# Patient Record
Sex: Female | Born: 1945 | Race: White | Hispanic: No | Marital: Married | Smoking: Never smoker
Health system: Southern US, Community
[De-identification: ages and names within clinical notes are randomized; demographics above are authoritative.]

## PROBLEM LIST (undated history)

## (undated) DIAGNOSIS — M719 Bursopathy, unspecified: Secondary | ICD-10-CM

## (undated) DIAGNOSIS — J329 Chronic sinusitis, unspecified: Secondary | ICD-10-CM

## (undated) DIAGNOSIS — I10 Essential (primary) hypertension: Secondary | ICD-10-CM

## (undated) DIAGNOSIS — R0600 Dyspnea, unspecified: Secondary | ICD-10-CM

## (undated) DIAGNOSIS — G4730 Sleep apnea, unspecified: Secondary | ICD-10-CM

## (undated) DIAGNOSIS — Z973 Presence of spectacles and contact lenses: Secondary | ICD-10-CM

## (undated) DIAGNOSIS — K219 Gastro-esophageal reflux disease without esophagitis: Secondary | ICD-10-CM

## (undated) DIAGNOSIS — M1990 Unspecified osteoarthritis, unspecified site: Secondary | ICD-10-CM

## (undated) DIAGNOSIS — E785 Hyperlipidemia, unspecified: Secondary | ICD-10-CM

## (undated) DIAGNOSIS — G7000 Myasthenia gravis without (acute) exacerbation: Secondary | ICD-10-CM

## (undated) DIAGNOSIS — M797 Fibromyalgia: Secondary | ICD-10-CM

## (undated) DIAGNOSIS — E119 Type 2 diabetes mellitus without complications: Secondary | ICD-10-CM

## (undated) HISTORY — PX: FINGER GANGLION CYST EXCISION: SHX1636

## (undated) HISTORY — DX: Fibromyalgia: M79.7

## (undated) HISTORY — PX: COLONOSCOPY: SHX174

## (undated) HISTORY — DX: Chronic sinusitis, unspecified: J32.9

## (undated) HISTORY — DX: Type 2 diabetes mellitus without complications: E11.9

## (undated) HISTORY — DX: Bursopathy, unspecified: M71.9

## (undated) HISTORY — PX: TONSILLECTOMY: SUR1361

## (undated) HISTORY — PX: INCISION AND DRAINAGE ABSCESS: SHX5864

---

## 1997-04-29 HISTORY — PX: ABDOMINAL HYSTERECTOMY: SHX81

## 2000-04-29 HISTORY — PX: BREAST SURGERY: SHX581

## 2001-10-05 ENCOUNTER — Ambulatory Visit (HOSPITAL_COMMUNITY): Admission: RE | Admit: 2001-10-05 | Discharge: 2001-10-05 | Payer: Self-pay | Admitting: General Surgery

## 2003-04-30 HISTORY — PX: CHOLECYSTECTOMY: SHX55

## 2003-08-04 ENCOUNTER — Ambulatory Visit (HOSPITAL_COMMUNITY): Admission: RE | Admit: 2003-08-04 | Discharge: 2003-08-04 | Payer: Self-pay | Admitting: Unknown Physician Specialty

## 2003-08-22 ENCOUNTER — Ambulatory Visit (HOSPITAL_COMMUNITY): Admission: RE | Admit: 2003-08-22 | Discharge: 2003-08-22 | Payer: Self-pay | Admitting: General Surgery

## 2004-03-09 ENCOUNTER — Ambulatory Visit: Payer: Self-pay | Admitting: Family Medicine

## 2004-08-03 ENCOUNTER — Ambulatory Visit: Payer: Self-pay | Admitting: Family Medicine

## 2004-08-14 ENCOUNTER — Ambulatory Visit: Payer: Self-pay | Admitting: Cardiology

## 2004-08-14 ENCOUNTER — Observation Stay (HOSPITAL_COMMUNITY): Admission: EM | Admit: 2004-08-14 | Discharge: 2004-08-15 | Payer: Self-pay | Admitting: Emergency Medicine

## 2004-08-16 ENCOUNTER — Ambulatory Visit: Payer: Self-pay

## 2004-08-31 ENCOUNTER — Ambulatory Visit: Payer: Self-pay | Admitting: Cardiovascular Disease

## 2004-11-12 ENCOUNTER — Ambulatory Visit: Payer: Self-pay | Admitting: Family Medicine

## 2004-11-12 ENCOUNTER — Ambulatory Visit: Payer: Self-pay | Admitting: Cardiology

## 2005-02-06 ENCOUNTER — Ambulatory Visit: Payer: Self-pay | Admitting: Family Medicine

## 2005-03-28 ENCOUNTER — Ambulatory Visit: Payer: Self-pay | Admitting: Family Medicine

## 2005-04-29 HISTORY — PX: CERVICAL FUSION: SHX112

## 2005-05-15 ENCOUNTER — Ambulatory Visit: Payer: Self-pay | Admitting: Family Medicine

## 2005-08-21 ENCOUNTER — Emergency Department (HOSPITAL_COMMUNITY): Admission: EM | Admit: 2005-08-21 | Discharge: 2005-08-21 | Payer: Self-pay | Admitting: Emergency Medicine

## 2005-08-29 ENCOUNTER — Encounter: Admission: RE | Admit: 2005-08-29 | Discharge: 2005-09-12 | Payer: Self-pay | Admitting: Sports Medicine

## 2005-09-19 ENCOUNTER — Inpatient Hospital Stay (HOSPITAL_COMMUNITY): Admission: RE | Admit: 2005-09-19 | Discharge: 2005-09-20 | Payer: Self-pay | Admitting: Neurosurgery

## 2005-12-05 ENCOUNTER — Ambulatory Visit: Payer: Self-pay | Admitting: Family Medicine

## 2006-01-21 ENCOUNTER — Ambulatory Visit: Payer: Self-pay | Admitting: Family Medicine

## 2006-04-09 ENCOUNTER — Ambulatory Visit: Payer: Self-pay | Admitting: Family Medicine

## 2006-06-03 ENCOUNTER — Ambulatory Visit: Payer: Self-pay | Admitting: Cardiology

## 2006-07-22 ENCOUNTER — Encounter: Admission: RE | Admit: 2006-07-22 | Discharge: 2006-07-22 | Payer: Self-pay | Admitting: Family Medicine

## 2006-10-07 ENCOUNTER — Ambulatory Visit: Payer: Self-pay | Admitting: Family Medicine

## 2007-01-26 ENCOUNTER — Encounter: Admission: RE | Admit: 2007-01-26 | Discharge: 2007-04-26 | Payer: Self-pay | Admitting: Family Medicine

## 2007-06-18 ENCOUNTER — Ambulatory Visit: Payer: Self-pay | Admitting: Cardiology

## 2007-08-27 ENCOUNTER — Encounter: Admission: RE | Admit: 2007-08-27 | Discharge: 2007-08-27 | Payer: Self-pay | Admitting: Family Medicine

## 2008-03-17 ENCOUNTER — Encounter (INDEPENDENT_AMBULATORY_CARE_PROVIDER_SITE_OTHER): Payer: Self-pay | Admitting: Interventional Radiology

## 2008-03-17 ENCOUNTER — Other Ambulatory Visit: Admission: RE | Admit: 2008-03-17 | Discharge: 2008-03-17 | Payer: Self-pay | Admitting: Interventional Radiology

## 2008-03-17 ENCOUNTER — Encounter: Admission: RE | Admit: 2008-03-17 | Discharge: 2008-03-17 | Payer: Self-pay | Admitting: Endocrinology

## 2008-09-12 ENCOUNTER — Encounter: Admission: RE | Admit: 2008-09-12 | Discharge: 2008-09-12 | Payer: Self-pay | Admitting: Family Medicine

## 2009-11-06 ENCOUNTER — Encounter: Admission: RE | Admit: 2009-11-06 | Discharge: 2009-11-06 | Payer: Self-pay | Admitting: Obstetrics and Gynecology

## 2009-11-14 ENCOUNTER — Encounter: Admission: RE | Admit: 2009-11-14 | Discharge: 2009-11-14 | Payer: Self-pay | Admitting: Obstetrics and Gynecology

## 2010-05-20 ENCOUNTER — Encounter (HOSPITAL_COMMUNITY): Payer: Self-pay | Admitting: Obstetrics and Gynecology

## 2010-09-11 NOTE — Assessment & Plan Note (Signed)
[facility]                          [facility] NOTE   NAME:[name], [name]                    MRN:          [id number]  DATE:06/18/2007                            DOB:          [birth year]    PRIMARY CARE PHYSICIAN:  Dr. Joette Catching.   REASON FOR VISIT:  Cardiology follow-up.   HISTORY OF PRESENT ILLNESS:  [name] comes in for annual follow-up  visit.  She is not reporting any significant chest pain and has stable  NYHA class II dyspnea on exertion.  She states that she is limited by  bursitis affecting her left hip and is undergoing some therapy to  address this.  She also states that she has been diagnosed with sleep  apnea and is being considered for CPAP therapy.  We talked about diet  and weight loss.  She is actually heavier than she was last time she was  seen.  She reports her lipids are being followed Dr. Lysbeth Galas and that  these are in good order.  I reviewed her medications.   FOLLOWUP:  Electrocardiography shows sinus arrhythmia with prolonged PR  interval of 222 ms, otherwise no significant changes.   ALLERGIES:  CELEBREX, VIOXX and BEXTRA.   MEDICATIONS:  Avalide 150/12.5 mg p.o. daily, aspirin 81 mg p.o. daily.  Glucosamine as directed.  Omega 3 supplements 1000 mg p.o. b.i.d.  Prilosec 20 mg p.o. daily.  Simvastatin 40 mg p.o. daily.  Estradiol  0.025 mg daily.  Arthrotec.  Metformin 500 mg p.o. daily.   REVIEW OF SYSTEMS:  Status present illness.  Otherwise negative.   EXAMINATION:  The patient blood pressure 136/83 rate is 67.  Weight is  249 pounds.  Is an obese woman in no acute distress.  Examination neck reveals no elevated is pressure no loud bruits.  No  thyromegaly is noted.  Lungs are clear on breathing.  Cardiac exam reveals a regular rate and rhythm.  No loud murmur, S3  gallop.  EXTREMITIES:  Exhibit trace edema.   IMPRESSION AND RECOMMENDATIONS:  History of equivocal myocardial  perfusion  study in the past with no active angina and stable dyspnea on  exertion.  Her plan has been risk factor modification and observation  for development of any new symptoms.  The patient's electrocardiogram  was overall stable.  I have recommended continued efforts at diet,  exercise and weight loss.  Her medical regimen is reasonable includes  statin therapy.  Her goal LDL  should be around 70.  We will plan to continue a yearly follow-up  assuming she develop any new symptoms in the interim.     Jonelle Sidle, MD  Electronically Signed    SGM/MedQ  DD: 06/18/2007  DT: 06/18/2007  Job #: [id number]   cc:   Delaney Meigs, M.D.

## 2010-09-14 NOTE — H&P (Signed)
NAMENJERI, VICENTE NO.:  [id number]   MEDICAL RECORD NO.:  [id number]          PATIENT TYPE:  INP   LOCATION:  1825                         FACILITY:  [facility]   PHYSICIAN:  Jonelle Sidle, M.D. LHCDATE OF BIRTH:  09-03-1945   DATE OF ADMISSION:  08/14/2004  DATE OF DISCHARGE:                                HISTORY & PHYSICAL   PRIMARY CARE PHYSICIAN:  Primary care was previously by Dr. Colon Flattery in  [place].  She still sees physicians in the primary care office in [place].   CHIEF COMPLAINT:  Left arm and upper back discomfort.   HISTORY OF THE PRESENT ILLNESS:  Ms. Stjulien is a 65 year old woman with a  history of obesity, hypertension, dyslipidemia and reported family history  of cardiovascular disease.  She reports an approximate one-week long history  of intermittent left arm discomfort with radiation to the back on the left  side as well as the midscapular area.  She describes this as a dull ache,  and states that she has had recurrent intermittent episodes lasting anywhere  from a few minutes to several hours without clear precipitant, although she  thinks things may be worse when she moves her left arm.  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  She thought that this was  potentially bursitis and saw Dr. Brynda Greathouse here in [place] today who  apparently did some plane films  of the area and by patient report was noted  to have some degree of cervical disk disease, although this was not felt to  be clearly causing symptoms.  It was suggested that she have an  electrocardiogram and she went to [facility] where her electrocardiogram  showed normal sinus rhythm with nonspecific T wave changes.  There was a  history obtained at that time of potential chest pain and diaphoresis, and  she was subsequently sent to the emergency department.   On interview now the  patient complains only of her left arm and back  discomfort, denying any recent chest pain, shortness of breath, palpitations  or diaphoresis.  She states that she has been taking Tylenol and Advil at  home and did receive an aspirin at [facility].  A repeat electrocardiogram  in the emergency department shows borderline prolonged PR interval with no  clear ischemic ST changes and her initial point of care troponin I level is  normal.  We were asked to evaluate her further.  She does states that she  has had no previous cardiac testing.   ALLERGIES:  CELEBREX, VIOXX and BEXTRA ALL OF WHICH CAUSE RASH AND SWELLING.   MEDICATIONS:  Medications at home include:  1.  Avalide 150/12.5 mg 1 p.o. daily.  2.  Aspirin 81 mg p.o. daily.  3.  Glucosamine chondroitin as directed.  4.  Fish oil supplements 1,000 mg 2 tablets p.o. b.i.d.  5.  _____________ extract.   PAST MEDICAL HISTORY:  1.  Hypertension.  2.  Reported dyslipidemia, managed with ___________ extract and  fish oil      supplements.  3.  History of chronic right ankle discomfort.  The patient wears a brace      and uses Relafen p.r.n.  4.  Previous history of laparoscopic cholecystectomy due to biliary colic by      Dr. Lovell Sheehan back in April 2005.   FAMILY HISTORY:  The family history is reportedly significant for  cardiovascular disease.  The patient states that her mother died in her mid  68s with congestive heart failure and that her father had no significant  heart problems.  She does report some heart problems in her siblings  including the death of her sister at age 12 with what sounds to be a  hypertensive emergency and subsequent hemorrhagic event.   SOCIAL HISTORY:  The patient is married and has two daughters.  She has  worked as a Haematologist for 30 years, currently at [employer] in [place].  She resides in [place].  She denies any significant tobacco or alcohol use.   REVIEW OF SYSTEMS:  The review of systems is as  per the history of present  illness.  She has had no fevers, chills, hemoptysis, shortness of breath  with activity, lower extremity, palpitations, or syncope.  Review of systems  is otherwise negative.   PHYSICAL EXAMINATION:  VITAL SIGNS:  Blood pressure is 135/63.  Heart rate  is in the 80s and sinus rhythm.  Oxygen saturation is in the upper 90s on 2  liters nasal cannula.  GENERAL APPEARANCE:  In general this is an obese woman seated in bed in no  acute distress.  HEENT:  Conjunctivae and lids normal.  Oropharynx is clear.  NECK:  The neck is supple without elevated jugular venous pressure or  carotid bruits.  No thyromegaly is noted.  LUNGS:  The lungs exhibit normal breath sounds with normal breathing at  rest, and no wheezing.  HEART:  Cardiac exam reveals a regular rate and rhythm with no loud murmur  or S3 gallop.  There is no obvious pericardial rub noted.  ABDOMEN:  The abdomen is obese and nontender to palpation with normoactive  bowel sounds and no obvious organomegaly.  EXTREMITIES:  The extremities reveal no pitting edema.  Peripheral pulses  are 2+.  SKIN:  No ulcerative changes are noted.  MUSCULOSKELETAL:  No kyphosis is noted.  NEUROPSYCHIATRIC:  The patient is alert and oriented times three.  Affect is  normal.   LABORATORY DATA:  Laboratory data includes hemoglobin 13.6, hematocrit 40.0,  WBC 7.3 and platelets 327,000.  D-dimer less than 0.22.  ABGs; pH 7.36, pCO2  47, bicarb 26.  Sodium 140, potassium 3.8, chloride 107, glucose 101, BUN  14, and creatinine is pending.  Initial point of care troponin I levels are  less than 0.05 times two with normal CK/MB levels of 1.6 and 2.1, and peak  myoglobin of 178.   Chest x-ray is reported as showing no acute cardiopulmonary disease.   IMPRESSION:  1.  Recent intermittent left arm and upper back discomfort as described in      an obese 65 year old woman with hypertension, reported dyslipidemia and     potential  family history of cardiovascular disease.  She has had no      previous screening for cardiac disease.  Her electrocardiogram shows      nonspecific changes and her initial point of care cardiac markers are      normal.  Symptoms do seem musculoskeletal in the  description although      apparently she was seen by an orthopedist today who did not describe any      major symptom-inducing abnormalities so far.  2.  Hypertension on Avalide.  3.  Dyslipidemia on _________ rice and fish oil supplements.  4.  History of chronic right ankle pain.  The patient wears a brace and uses      Relafen.   PLAN:  1.  The patient will be admitted to observation.  2.  We will continue telemetry and cycle cardiac markers overnight.  3.  As soon as she remains stable and rules out for myocardial infarction we      can likely discharge for a close follow up adenosine Myoview for further      risk stratification.  Otherwise if cardiac markers are abnormal we will      need to consider invasive coronary angiography.  4.  We will also follow up fasting lipid profile.  5.  Continue Avalide.  6.  We will not use heparin or Lovenox for the time being unless cardiac      markers trend abnormally.  7.  Further plans to follow.      SGM/MEDQ  D:  08/14/2004  T:  08/15/2004  Job:  [id number]

## 2010-09-14 NOTE — Assessment & Plan Note (Signed)
[facility]                            CARDIOLOGY OFFICE NOTE   NAME:[name], [name]                    MRN:          [id number]  DATE:06/03/2006                            DOB:          [birth year]    PRIMARY CARE PHYSICIAN:  Delaney Meigs, M.D.   REASON FOR VISIT:  Routine followup.   HISTORY OF PRESENT ILLNESS:  I last saw [name] in the office back in  July of 2006 in [place]. She had a prior history of shoulder and chest  discomfort and ultimately underwent an outpatient Myoview, which was  overall low risk, but suggested the possibility of mild anterior  ischemia versus shifting breast attentuation artifact. Her symptoms  subsequently resolved with anti-inflammatory therapy and were felt to be  possibly musculoskeletal. She tells me in the interim that she was  diagnosed with cervical disc disease and actually had to undergo  surgery. These previous symptoms have resolved. She was due to see [place]  back this past summerbut cancelled her visit and comes in today for  routine follow-up. She is not reporting any progressive exertional chest  pain or dyspnea. She occasionally had a vague discomfort below her left  breast, that does not seem to have any specific precipitant and she  states that this does not bother or limit her to any signficant degree.   Her electrocardiogram today is normal showing sinus rhythm at 89 beats  per minute.   I reviewed her medications.   ALLERGIES:  1. CELEBREX.  2. VIOXX.  3. BEXTRA.   PRESENT MEDICATIONS:  1. Aspirin 81 mg p.o. daily.  2. Avalide 150/12.5 mg p.o. daily.  3. Glucosamine daily.  4. Fish oil supplements 1000 mg p.o. b.i.d.  5. Prilosec 20 mg p.o. daily.  6. Simvastatin 20 mg p.o. daily.   REVIEW OF SYSTEMS:  As per History of Present Illness.   PHYSICAL EXAMINATION:  Blood pressure is 148/84. Heart rate is 89. The  patient is comfortable and in no acute distress.  HEENT: Conjunctivae  and lids are normal. Oropharynx is clear.  NECK: Supple, without elevated jugular pressure or loud bruits.  LUNGS:  Are clear without labored breathing.  CARDIAC: Reveals a regular rate and rhythm without loud murmur or S3  gallop. No pericardial rub is noted.  ABDOMEN: Obese. No obvious bruits. No tenderness.  EXTREMITIES: Exhibit increase adipose tissue. No obvious pitting edema.   IMPRESSIONS AND RECOMMENDATIONS:  1. History of equivocal myocardial perfusion study as outlined above.      The patient's aforementioned symptoms have completely resolved and      may have been due to musculoskeletal and/or neuropathic discomfort      particularly with the interim diagnosis of cervical disc disease.      In any event, I have recommended a strategy of risk factor      modification particularly given her family history. Her medical      regimen is reasonable and I have asked her to continue to followup      with Dr. Lysbeth Galas in the meanwhile for blood pressure and lipid  management. We will otherwise schedule a yearly followup assuming      that she does not develop any progressive symptoms in the interim.  2. Further plans to follow.     Jonelle Sidle, MD  Electronically Signed    SGM/MedQ  DD: 06/03/2006  DT: 06/03/2006  Job #: [id number]   cc:   Delaney Meigs, M.D.

## 2010-09-14 NOTE — Op Note (Signed)
NAMEGAYLA, BENN NO.:  [id number]   MEDICAL RECORD NO.:  [id number]          PATIENT TYPE:  INP   LOCATION:  3003                         FACILITY:  [facility]   PHYSICIAN:  Clydene Fake, M.D.  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   OPERATION PERFORMED:  Anterior cervical diskectomy and fusion of C6-7 with  Life-Net allograft bone, Eagle anterior cervical plate.   SURGEON:  Clydene Fake, M.D.   ASSISTANT:  Payton Doughty, M.D.   ANESTHESIA:  General endotracheal.   ESTIMATED BLOOD LOSS:  Minimal.   BLOOD REPLACED:  None.   DRAINS:  None.   COMPLICATIONS:  None.   INDICATIONS FOR PROCEDURE:  The patient is a 65 year old woman who has had  severe pain in neck and left arm numbness and weakness and found to have a  large disk herniation on the left side at C6-7 causing a triceps weakness  and numbness __________  Patient brought in for decompression and fusion.   DESCRIPTION OF PROCEDURE:  The patient was brought to the operating room and  general anesthesia induced.  The patient was placed in 10 pounds halter  traction, prepped and draped in sterile fashion.  Site of incision was  injected with 19 mL of 1% lidocaine with epinephrine.  Incision was then  made from the midline to the anterior border of the sternocleidomastoid  muscle on the left side of the neck incision and taken down to the platysma  and hemostasis was obtained with Bovie cauterization.  The platysma was  incised with a Bovie and blunt dissection taken through the anterior  cervical fascia, the anterior cervical spine.  Needles placed in the  interspace, x-rays obtained showing that  the needles were too low. Needles  were repositioned and x-ray was obtained showing needles were at the C6-7  disk space.  This space was incised with a 15 blade and a partial diskectomy  as the needle was removed.  The longus colli muscles reflected laterally on  each side using Bovie and self-retaining retractors were placed.  Distraction pins placed in the C6 and 7.  Interspace distracted.  Diskectomy  was then continued using Kerrison punches to remove anterior osteophytes,  curettes and pituitaries to remove further disk.  1 or 2 mm Kerrison punches  were then used to remove posterior disk, osteophytes and posterior ligament  and bilateral foraminotomies were performed.  We removed three large free  fragments of disk from the left foramen decompressing the nerve root there.  When we were finished, we had good decompression.  Wound was irrigated with  antibiotic solution.  We used high speed drill to remove cartilaginous end  plate, measured the interspace to be 5 mm and a 5 mm LifeNet allograft bone  was tapped into place countersunk 5 mm.  We checked posterior to  graft.  There was plenty of room between bone graft and dura.  Distraction pins were  removed.  The weight was removed from the traction.  Hemostasis was obtained  with Gelfoam and thrombin.  An Eagle anterior cervical plate was placed on  the cervical spine. Two screws placed into C6, two into C7.  These were  tightened down.  Wound was irrigated with antibiotic solution.  Retractors  were removed.  Hemostasis was obtained with bipolar cauterization and  Gelfoam with thrombin.  Gelfoam was irrigated out.  Irrigated with  antibiotic solution.  We had good hemostasis and platysma was closed with 3-  0 Vicryl interrupted suture and subcutaneous tissue closed with the same.  Skin closed with benzoin and Steri-Strips.  Dressing was placed.  Patient  was placed in soft collar, awakened from anesthesia and transferred to  recovery  room in stable condition.           ______________________________  Clydene Fake, M.D.     JRH/MEDQ  D:  09/19/2005  T:  09/19/2005  Job:  [id number]

## 2010-09-14 NOTE — Discharge Summary (Signed)
NAMEJILLENE, [name]             ACCOUNT NO.:  [id number]   MEDICAL RECORD NO.:  [id number]          PATIENT TYPE:  INP   LOCATION:  3702                         FACILITY:  [facility]   PHYSICIAN:  Cecil Cranker, M.D.DATE OF BIRTH:  May 31, 1945   DATE OF ADMISSION:  08/14/2004  DATE OF DISCHARGE:  08/15/2004                                 DISCHARGE SUMMARY   DISCHARGING DIAGNOSIS:  Chest pain, negative cardiac enzymes.  Electrocardiogram within normal limits per Dr. Ulyess Mort.   Being discharged home.   Follow up in our office in the a.m. for stress test.   PAST MEDICAL HISTORY:  1.  Hypertension.  2.  Reported dyslipidemia.  3.  History of chronic right ankle discomfort.  The patient wears a brace      and uses Relafen p.r.n.  4.  Previous history of laparoscopic cholecystectomy due to biliary colic by      Dr. Lovell Sheehan back in April 2005.   DISPOSITION:  Patient being discharged home, with instructions to continue  her previous medications, including Avalide; aspirin; glucosamine and  chondroitin; fish oil supplements.  She is instructed to follow a lowfat,  low salt, low cholesterol diet.  She is to follow up at our office tomorrow  morning, Thursday August 16, 2004 at 7:00 a.m. for adenosine Myoview.  I have  instructed the patient to be n.p.o. after midnight for stress test.  She has  a follow-up appointment with Dr. Diona Browner, Aug 31, 2004 at 10:30 a.m. at our  office.   HOSPITAL COURSE:  This is a 65 year old Caucasian female with history of  obesity, hypertension, dyslipidemia, and reported family history of  cerebrovascular disease.  She was previously followed by Dr. Elyn Aquas in  [place].  She still, however, sees physicians in the primary care office in  [place].  Her cardiologist will be Dr. Nona Dell.  The patient  reported to [facility] with complaints of week-long history of intermittent  left arm discomfort with radiation to the back on her left side as  well as  some midscapular area discomfort.  She described it as a dull ache with  intermittent episodes.  The patient presented to [facility] with the  complaint as stated above, where EKG showed normal sinus rhythm with  nonspecific T wave changes.  The patient was sent to the emergency room from  [facility].   INITIAL EXAMINATION:  VITAL SIGNS:  The patient was afebrile; blood pressure  135/63; heart rate in the 80s, normal sinus rhythm.   Initial blood work showing hematocrit 40.  D-dimer less than 0.22.  BUN 14.  Initial point of care troponin I levels were less than 0.05 x 2, with a peak  myoglobin of 178.  Chest x-ray showed no acute cardiopulmonary disease.   The patient was admitted for observation on telemetry.  We cycled her  cardiac enzymes overnight.  The patient had no further complaints of chest  discomfort.  Dr. Celso Sick[employer] in to see patient the following morning.  The following morning, patient without further complaints of chest  discomfort.  Cardiac enzymes negative x  3.  Patient being discharged home,  with followup as stated.      MB/MEDQ  D:  08/15/2004  T:  08/15/2004  Job:  [id number]

## 2011-01-07 ENCOUNTER — Emergency Department (HOSPITAL_COMMUNITY)
Admission: EM | Admit: 2011-01-07 | Discharge: 2011-01-07 | Disposition: A | Discharge status: Home / Self Care | Payer: BC Managed Care – PPO | Attending: Emergency Medicine | Admitting: Emergency Medicine

## 2011-01-07 DIAGNOSIS — I10 Essential (primary) hypertension: Secondary | ICD-10-CM | POA: Insufficient documentation

## 2011-01-07 DIAGNOSIS — M79609 Pain in unspecified limb: Secondary | ICD-10-CM | POA: Insufficient documentation

## 2011-01-07 DIAGNOSIS — E7800 Pure hypercholesterolemia, unspecified: Secondary | ICD-10-CM | POA: Insufficient documentation

## 2011-01-07 DIAGNOSIS — IMO0002 Reserved for concepts with insufficient information to code with codable children: Secondary | ICD-10-CM | POA: Insufficient documentation

## 2011-01-07 DIAGNOSIS — Z79899 Other long term (current) drug therapy: Secondary | ICD-10-CM | POA: Insufficient documentation

## 2011-01-07 LAB — POCT I-STAT, CHEM 8
Chloride: 104 mEq/L (ref 96–112)
Creatinine, Ser: 0.7 mg/dL (ref 0.50–1.10)
Potassium: 3.9 mEq/L (ref 3.5–5.1)
Sodium: 138 mEq/L (ref 135–145)

## 2012-09-07 ENCOUNTER — Encounter (HOSPITAL_BASED_OUTPATIENT_CLINIC_OR_DEPARTMENT_OTHER): Payer: Self-pay | Admitting: *Deleted

## 2012-09-07 NOTE — Progress Notes (Signed)
Pt has been staying with 66 yr old mother in law-husband to bring for surgery-works in [place]-to come for bmet-ekg-sees cardiology only for hx-switched from [place] to [employer] call-has not had to see cardiology 2 yr Has sleep apnea-to bring all meds and cpap-may need to stay overnight

## 2012-09-08 ENCOUNTER — Encounter (HOSPITAL_BASED_OUTPATIENT_CLINIC_OR_DEPARTMENT_OTHER)
Admission: RE | Admit: 2012-09-08 | Discharge: 2012-09-08 | Disposition: A | Discharge status: Home / Self Care | Payer: BC Managed Care – PPO | Source: Ambulatory Visit | Attending: Orthopedic Surgery | Admitting: Orthopedic Surgery

## 2012-09-08 ENCOUNTER — Other Ambulatory Visit: Payer: Self-pay | Admitting: Orthopedic Surgery

## 2012-09-08 LAB — BASIC METABOLIC PANEL
Chloride: 100 mEq/L (ref 96–112)
GFR calc Af Amer: >90 mL/min (ref >90)
GFR calc non Af Amer: >90 mL/min (ref >90)
Potassium: 3.4 mEq/L — ABNORMAL LOW (ref 3.5–5.1)
Sodium: 139 mEq/L (ref 135–145)

## 2012-09-08 NOTE — Progress Notes (Signed)
Instructed pt she may take lyrica dos with sip of water

## 2012-09-08 NOTE — Progress Notes (Signed)
Lab cleared by Dr Jean Rosenthal

## 2012-09-09 ENCOUNTER — Ambulatory Visit (HOSPITAL_BASED_OUTPATIENT_CLINIC_OR_DEPARTMENT_OTHER)
Admission: RE | Admit: 2012-09-09 | Discharge: 2012-09-09 | Disposition: A | Discharge status: Home / Self Care | Payer: BC Managed Care – PPO | Source: Ambulatory Visit | Attending: Orthopedic Surgery | Admitting: Orthopedic Surgery

## 2012-09-09 ENCOUNTER — Encounter (HOSPITAL_BASED_OUTPATIENT_CLINIC_OR_DEPARTMENT_OTHER): Payer: Self-pay | Admitting: *Deleted

## 2012-09-09 ENCOUNTER — Ambulatory Visit (HOSPITAL_BASED_OUTPATIENT_CLINIC_OR_DEPARTMENT_OTHER): Payer: BC Managed Care – PPO | Admitting: *Deleted

## 2012-09-09 ENCOUNTER — Encounter (HOSPITAL_BASED_OUTPATIENT_CLINIC_OR_DEPARTMENT_OTHER)
Admission: RE | Disposition: A | Discharge status: Home / Self Care | Payer: Self-pay | Source: Ambulatory Visit | Attending: Orthopedic Surgery

## 2012-09-09 DIAGNOSIS — G4730 Sleep apnea, unspecified: Secondary | ICD-10-CM | POA: Insufficient documentation

## 2012-09-09 DIAGNOSIS — M19019 Primary osteoarthritis, unspecified shoulder: Secondary | ICD-10-CM | POA: Insufficient documentation

## 2012-09-09 DIAGNOSIS — M129 Arthropathy, unspecified: Secondary | ICD-10-CM | POA: Insufficient documentation

## 2012-09-09 DIAGNOSIS — M9420 Chondromalacia, unspecified site: Secondary | ICD-10-CM | POA: Insufficient documentation

## 2012-09-09 DIAGNOSIS — M719 Bursopathy, unspecified: Secondary | ICD-10-CM | POA: Insufficient documentation

## 2012-09-09 DIAGNOSIS — Z01812 Encounter for preprocedural laboratory examination: Secondary | ICD-10-CM | POA: Insufficient documentation

## 2012-09-09 DIAGNOSIS — S43439A Superior glenoid labrum lesion of unspecified shoulder, initial encounter: Secondary | ICD-10-CM | POA: Insufficient documentation

## 2012-09-09 DIAGNOSIS — M67919 Unspecified disorder of synovium and tendon, unspecified shoulder: Secondary | ICD-10-CM | POA: Insufficient documentation

## 2012-09-09 DIAGNOSIS — I10 Essential (primary) hypertension: Secondary | ICD-10-CM | POA: Insufficient documentation

## 2012-09-09 DIAGNOSIS — K219 Gastro-esophageal reflux disease without esophagitis: Secondary | ICD-10-CM | POA: Insufficient documentation

## 2012-09-09 DIAGNOSIS — E785 Hyperlipidemia, unspecified: Secondary | ICD-10-CM | POA: Insufficient documentation

## 2012-09-09 HISTORY — DX: Unspecified osteoarthritis, unspecified site: M19.90

## 2012-09-09 HISTORY — DX: Gastro-esophageal reflux disease without esophagitis: K21.9

## 2012-09-09 HISTORY — DX: Presence of spectacles and contact lenses: Z97.3

## 2012-09-09 HISTORY — DX: Sleep apnea, unspecified: G47.30

## 2012-09-09 HISTORY — DX: Hyperlipidemia, unspecified: E78.5

## 2012-09-09 HISTORY — DX: Essential (primary) hypertension: I10

## 2012-09-09 HISTORY — PX: SHOULDER ARTHROSCOPY WITH OPEN ROTATOR CUFF REPAIR AND DISTAL CLAVICLE ACROMINECTOMY: SHX5683

## 2012-09-09 SURGERY — SHOULDER ARTHROSCOPY WITH OPEN ROTATOR CUFF REPAIR AND DISTAL CLAVICLE ACROMINECTOMY
Anesthesia: Regional | Site: Shoulder | Laterality: Right | Wound class: Clean

## 2012-09-09 MED ORDER — BUPIVACAINE-EPINEPHRINE PF 0.5-1:200000 % IJ SOLN
INTRAMUSCULAR | Status: DC | PRN
  Administered 2012-09-09: 25 mL

## 2012-09-09 MED ORDER — SUCCINYLCHOLINE CHLORIDE 20 MG/ML IJ SOLN
INTRAMUSCULAR | Status: DC | PRN
  Administered 2012-09-09: 120 mg via INTRAVENOUS

## 2012-09-09 MED ORDER — MIDAZOLAM HCL 2 MG/2ML IJ SOLN: 1.0000 mg | INTRAMUSCULAR | Status: DC | PRN

## 2012-09-09 MED ORDER — SODIUM CHLORIDE 0.9 % IR SOLN
Status: DC | PRN
  Administered 2012-09-09: 12:00:00

## 2012-09-09 MED ORDER — OXYCODONE HCL 5 MG/5ML PO SOLN: 5.0000 mg | Freq: Once | ORAL | Status: DC | PRN

## 2012-09-09 MED ORDER — DEXAMETHASONE SODIUM PHOSPHATE 4 MG/ML IJ SOLN
INTRAMUSCULAR | Status: DC | PRN
  Administered 2012-09-09: 4 mg

## 2012-09-09 MED ORDER — CEFAZOLIN SODIUM 1-5 GM-% IV SOLN
1.0000 g | Freq: Once | INTRAVENOUS | Status: AC
  Administered 2012-09-09: 1 g via INTRAVENOUS

## 2012-09-09 MED ORDER — LACTATED RINGERS IV SOLN
INTRAVENOUS | Status: DC
  Administered 2012-09-09 (×2): via INTRAVENOUS

## 2012-09-09 MED ORDER — ONDANSETRON HCL 4 MG/2ML IJ SOLN
INTRAMUSCULAR | Status: DC | PRN
  Administered 2012-09-09: 4 mg via INTRAVENOUS

## 2012-09-09 MED ORDER — FENTANYL CITRATE 0.05 MG/ML IJ SOLN
50.0000 ug | INTRAMUSCULAR | Status: DC | PRN
  Administered 2012-09-09: 200 ug via INTRAVENOUS

## 2012-09-09 MED ORDER — EPHEDRINE SULFATE 50 MG/ML IJ SOLN
INTRAMUSCULAR | Status: DC | PRN
  Administered 2012-09-09 (×3): 10 mg via INTRAVENOUS

## 2012-09-09 MED ORDER — LIDOCAINE HCL 4 % MT SOLN
OROMUCOSAL | Status: DC | PRN
  Administered 2012-09-09: 3 mL via TOPICAL

## 2012-09-09 MED ORDER — LIDOCAINE HCL (CARDIAC) 20 MG/ML IV SOLN
INTRAVENOUS | Status: DC | PRN
  Administered 2012-09-09: 60 mg via INTRAVENOUS

## 2012-09-09 MED ORDER — OXYCODONE HCL 5 MG PO TABS: 5.0000 mg | ORAL_TABLET | Freq: Once | ORAL | Status: DC | PRN

## 2012-09-09 MED ORDER — CEFAZOLIN SODIUM-DEXTROSE 2-3 GM-% IV SOLR
2.0000 g | INTRAVENOUS | Status: AC
Start: 2012-09-09 — End: 2012-09-09
  Administered 2012-09-09: 2 g via INTRAVENOUS

## 2012-09-09 MED ORDER — HYDROMORPHONE HCL PF 1 MG/ML IJ SOLN: 0.2500 mg | INTRAMUSCULAR | Status: DC | PRN

## 2012-09-09 MED ORDER — OXYCODONE-ACETAMINOPHEN 5-325 MG PO TABS: 1.0000 | ORAL_TABLET | Freq: Four times a day (QID) | ORAL | Status: DC | PRN

## 2012-09-09 MED ORDER — POVIDONE-IODINE 7.5 % EX SOLN: Freq: Once | CUTANEOUS | Status: DC

## 2012-09-09 MED ORDER — PROPOFOL 10 MG/ML IV BOLUS
INTRAVENOUS | Status: DC | PRN
  Administered 2012-09-09: 160 mg via INTRAVENOUS

## 2012-09-09 SURGICAL SUPPLY — 85 items
ANCH SUT 2 FT CRKSW 14.7X5.5 (Anchor) ×4 IMPLANT
ANCH SUT PUSHLCK 24X4.5 STRL (Orthopedic Implant) ×2 IMPLANT
ANCHOR CORKSCREW FIBER 5.5X15 (Anchor) ×4 IMPLANT
APL SKNCLS STERI-STRIP NONHPOA (GAUZE/BANDAGES/DRESSINGS) ×2
BENZOIN TINCTURE PRP APPL 2/3 (GAUZE/BANDAGES/DRESSINGS) ×2 IMPLANT
BLADE SURG 15 STRL LF DISP TIS (BLADE) ×1 IMPLANT
BLADE SURG 15 STRL SS (BLADE) ×3
BLADE VORTEX 6.0 (BLADE) ×3 IMPLANT
CANISTER OMNI JUG 16 LITER (MISCELLANEOUS) IMPLANT
CANISTER SUCTION 2500CC (MISCELLANEOUS) IMPLANT
CANNULA 5.75X71 LONG (CANNULA) IMPLANT
CANNULA TWIST IN 8.25X7CM (CANNULA) IMPLANT
CLOTH BEACON ORANGE TIMEOUT ST (SAFETY) ×3 IMPLANT
CUTTER MENISCUS  4.2MM (BLADE) ×1
CUTTER MENISCUS 4.2MM (BLADE) ×2 IMPLANT
DECANTER SPIKE VIAL GLASS SM (MISCELLANEOUS) IMPLANT
DRAPE INCISE IOBAN 66X45 STRL (DRAPES) ×3 IMPLANT
DRAPE STERI 35X30 U-POUCH (DRAPES) ×3 IMPLANT
DRAPE SURG 17X23 STRL (DRAPES) ×3 IMPLANT
DRAPE U-SHAPE 47X51 STRL (DRAPES) ×3 IMPLANT
DRAPE U-SHAPE 76X120 STRL (DRAPES) ×6 IMPLANT
DRSG EMULSION OIL 3X3 NADH (GAUZE/BANDAGES/DRESSINGS) ×3 IMPLANT
DRSG PAD ABDOMINAL 8X10 ST (GAUZE/BANDAGES/DRESSINGS) ×6 IMPLANT
DURAPREP 26ML APPLICATOR (WOUND CARE) ×5 IMPLANT
ELECT REM PT RETURN 9FT ADLT (ELECTROSURGICAL) ×3
ELECTRODE REM PT RTRN 9FT ADLT (ELECTROSURGICAL) ×2 IMPLANT
GLOVE BIO SURGEON STRL SZ 6.5 (GLOVE) ×2 IMPLANT
GLOVE BIOGEL PI IND STRL 6.5 (GLOVE) ×1 IMPLANT
GLOVE BIOGEL PI IND STRL 7.0 (GLOVE) ×1 IMPLANT
GLOVE BIOGEL PI IND STRL 8 (GLOVE) ×4 IMPLANT
GLOVE BIOGEL PI INDICATOR 6.5 (GLOVE) ×1
GLOVE BIOGEL PI INDICATOR 7.0 (GLOVE) ×1
GLOVE BIOGEL PI INDICATOR 8 (GLOVE) ×2
GLOVE ECLIPSE 6.5 STRL STRAW (GLOVE) ×4 IMPLANT
GLOVE ECLIPSE 7.5 STRL STRAW (GLOVE) ×6 IMPLANT
GOWN BRE IMP PREV XXLGXLNG (GOWN DISPOSABLE) ×3 IMPLANT
GOWN PREVENTION PLUS XLARGE (GOWN DISPOSABLE) ×5 IMPLANT
GOWN PREVENTION PLUS XXLARGE (GOWN DISPOSABLE) ×3 IMPLANT
IV NS IRRIG 3000ML ARTHROMATIC (IV SOLUTION) ×6 IMPLANT
NDL 1/2 CIR CATGUT .05X1.09 (NEEDLE) IMPLANT
NDL HYPO 18GX1.5 BLUNT FILL (NEEDLE) ×1 IMPLANT
NDL SCORPION MULTI FIRE (NEEDLE) IMPLANT
NDL SUT 6 .5 CRC .975X.05 MAYO (NEEDLE) ×1 IMPLANT
NEEDLE 1/2 CIR CATGUT .05X1.09 (NEEDLE) IMPLANT
NEEDLE HYPO 18GX1.5 BLUNT FILL (NEEDLE) ×3 IMPLANT
NEEDLE MAYO TAPER (NEEDLE) ×3
NEEDLE SCORPION MULTI FIRE (NEEDLE) ×3 IMPLANT
NS IRRIG 1000ML POUR BTL (IV SOLUTION) IMPLANT
PACK ARTHROSCOPY DSU (CUSTOM PROCEDURE TRAY) ×3 IMPLANT
PACK BASIN DAY SURGERY FS (CUSTOM PROCEDURE TRAY) ×3 IMPLANT
PASSER SUT SWANSON 36MM LOOP (INSTRUMENTS) IMPLANT
PENCIL BUTTON HOLSTER BLD 10FT (ELECTRODE) ×2 IMPLANT
PUSHLOCK PEEK 4.5X24 (Orthopedic Implant) ×2 IMPLANT
SET IRRIG Y TYPE TUR BLADDER L (SET/KITS/TRAYS/PACK) ×3 IMPLANT
SLEEVE SCD COMPRESS KNEE MED (MISCELLANEOUS) ×3 IMPLANT
SLING ARM FOAM STRAP LRG (SOFTGOODS) IMPLANT
SLING ARM FOAM STRAP MED (SOFTGOODS) IMPLANT
SLING ARM FOAM STRAP XLG (SOFTGOODS) ×2 IMPLANT
SLING ARM IMMOBILIZER LRG (SOFTGOODS) IMPLANT
SPONGE GAUZE 4X4 12PLY (GAUZE/BANDAGES/DRESSINGS) ×3 IMPLANT
SPONGE LAP 4X18 X RAY DECT (DISPOSABLE) ×2 IMPLANT
STRIP CLOSURE SKIN 1/2X4 (GAUZE/BANDAGES/DRESSINGS) ×2 IMPLANT
SUCTION FRAZIER TIP 10 FR DISP (SUCTIONS) ×2 IMPLANT
SUT ETHIBOND 2 OS 4 DA (SUTURE) IMPLANT
SUT ETHILON 4 0 PS 2 18 (SUTURE) IMPLANT
SUT MNCRL AB 3-0 PS2 18 (SUTURE) ×2 IMPLANT
SUT PDS AB 0 CT 36 (SUTURE) IMPLANT
SUT PROLENE 3 0 PS 2 (SUTURE) IMPLANT
SUT TICRON 1 T 12 (SUTURE) IMPLANT
SUT TIGER TAPE 7 IN WHITE (SUTURE) IMPLANT
SUT VIC AB 0 CT1 27 (SUTURE)
SUT VIC AB 0 CT1 27XBRD ANBCTR (SUTURE) IMPLANT
SUT VIC AB 0 SH 27 (SUTURE) ×2 IMPLANT
SUT VIC AB 1 CT1 27 (SUTURE)
SUT VIC AB 1 CT1 27XBRD ANBCTR (SUTURE) IMPLANT
SUT VIC AB 2-0 SH 27 (SUTURE) ×3
SUT VIC AB 2-0 SH 27XBRD (SUTURE) ×2 IMPLANT
SYR 5ML LL (SYRINGE) ×1 IMPLANT
TAPE FIBER 2MM 7IN #2 BLUE (SUTURE) IMPLANT
TOWEL OR 17X24 6PK STRL BLUE (TOWEL DISPOSABLE) ×3 IMPLANT
TOWEL OR NON WOVEN STRL DISP B (DISPOSABLE) ×3 IMPLANT
TUBE CONNECTING 20X1/4 (TUBING) IMPLANT
WAND STAR VAC 90 (SURGICAL WAND) ×3 IMPLANT
WATER STERILE IRR 1000ML POUR (IV SOLUTION) ×3 IMPLANT
YANKAUER SUCT BULB TIP NO VENT (SUCTIONS) ×2 IMPLANT

## 2012-09-09 NOTE — Anesthesia Preprocedure Evaluation (Signed)
Anesthesia Evaluation  Patient identified by MRN, date of birth, ID band Patient awake    Reviewed: Allergy & Precautions, H&P , NPO status , Patient's Chart, lab work & pertinent test results  Airway Mallampati: II TM Distance: >3 FB Neck ROM: Full    Dental   Pulmonary sleep apnea ,  breath sounds clear to auscultation        Cardiovascular hypertension, Rhythm:Regular Rate:Normal     Neuro/Psych    GI/Hepatic   Endo/Other  diabetes  Renal/GU      Musculoskeletal   Abdominal (+) + obese,   Peds  Hematology   Anesthesia Other Findings   Reproductive/Obstetrics                           Anesthesia Physical Anesthesia Plan  ASA: III  Anesthesia Plan: General   Post-op Pain Management:    Induction: Intravenous  Airway Management Planned: Oral ETT  Additional Equipment:   Intra-op Plan:   Post-operative Plan: Extubation in OR  Informed Consent: I have reviewed the patients History and Physical, chart, labs and discussed the procedure including the risks, benefits and alternatives for the proposed anesthesia with the patient or authorized representative who has indicated his/her understanding and acceptance.     Plan Discussed with: CRNA and Surgeon  Anesthesia Plan Comments:         Anesthesia Quick Evaluation

## 2012-09-09 NOTE — Brief Op Note (Signed)
09/09/2012  1:49 PM  PATIENT:  [name]  67 y.o. female  PRE-OPERATIVE DIAGNOSIS:  Rotator cuff tear right shoulder  POST-OPERATIVE DIAGNOSIS:  Rotator cuff tear right shoulder  PROCEDURE:  Procedure(s): Right Shoulder Arthroscopy with distal clavicle excision and mini-open rotator cuff repair. (Right)  SURGEON:  Surgeon(s) and Role:    * Harvie Junior, MD - Primary  PHYSICIAN ASSISTANT:   ASSISTANTS: bethune   ANESTHESIA:   general  EBL:  Total I/O In: 1700 [I.V.:1700] Out: -   BLOOD ADMINISTERED:none  DRAINS: none   LOCAL MEDICATIONS USED:  MARCAINE     SPECIMEN:  No Specimen  DISPOSITION OF SPECIMEN:  N/A  COUNTS:  YES  TOURNIQUET:  * No tourniquets in log *  DICTATION: .Other Dictation: Dictation Number [id number]  PLAN OF CARE: Discharge to home after PACU  PATIENT DISPOSITION:  PACU - hemodynamically stable.   Delay start of Pharmacological VTE agent (>24hrs) due to surgical blood loss or risk of bleeding: no

## 2012-09-09 NOTE — H&P (Signed)
PREOPERATIVE H&P  Chief Complaint: r shoulder pain  HPI: [name] is a 67 y.o. female who presents for evaluation of r shoulder pain. It has been present for 6 months and has been worsening. She has failed conservative measures. Pain is rated as moderate.  Past Medical History  Diagnosis Date  . Hypertension   . GERD (gastroesophageal reflux disease)   . Arthritis   . Sleep apnea     uses a cpap  . Hyperlipemia   . Wears glasses    Past Surgical History  Procedure Laterality Date  . Cervical fusion  2007  . Tonsillectomy    . Abdominal hysterectomy  1999  . Colonoscopy    . Breast surgery  2002    rt br bx  . Cholecystectomy  2005    lap choli  . Finger ganglion cyst excision     History   Social History  . Marital Status: Married    Spouse Name: N/A    Number of Children: N/A  . Years of Education: N/A   Social History Main Topics  . Smoking status: Never Smoker   . Smokeless tobacco: None  . Alcohol Use: No  . Drug Use: No  . Sexually Active: None   Other Topics Concern  . None   Social History Narrative  . None   History reviewed. No pertinent family history. Allergies  Allergen Reactions  . Celebrex (Celecoxib) Swelling    hives  . Vioxx (Rofecoxib) Hives   Prior to Admission medications   Medication Sig Start Date End Date Taking? Authorizing Provider  losartan-hydrochlorothiazide (HYZAAR) 100-25 MG per tablet Take 1 tablet by mouth daily.   Yes Historical Provider, MD  metFORMIN (GLUCOPHAGE) 500 MG tablet Take 500 mg by mouth 2 (two) times daily with a meal.   Yes Historical Provider, MD  omega-3 acid ethyl esters (LOVAZA) 1 G capsule Take 2 g by mouth daily.   Yes Historical Provider, MD  omeprazole (PRILOSEC) 20 MG capsule Take 20 mg by mouth daily.   Yes Historical Provider, MD  pregabalin (LYRICA) 50 MG capsule Take 50 mg by mouth 3 (three) times daily.   Yes Historical Provider, MD  rosuvastatin (CRESTOR) 10 MG tablet Take 10 mg by  mouth daily. Takes 1/2   Yes Historical Provider, MD  traMADol (ULTRAM) 50 MG tablet Take 50 mg by mouth every 6 (six) hours as needed for pain.   Yes Historical Provider, MD     Positive ROS: none  All other systems have been reviewed and were otherwise negative with the exception of those mentioned in the HPI and as above.  Physical Exam: Filed Vitals:   09/09/12 1045  BP: 116/59  Pulse: 83  Temp:   Resp: 18    General: Alert, no acute distress Cardiovascular: No pedal edema Respiratory: No cyanosis, no use of accessory musculature GI: No organomegaly, abdomen is soft and non-tender Skin: No lesions in the area of chief complaint Neurologic: Sensation intact distally Psychiatric: Patient is competent for consent with normal mood and affect Lymphatic: No axillary or cervical lymphadenopathy  MUSCULOSKELETAL: +impingement//+ttp over lat shoulder//+ttp over ac joint  Assessment/Plan: rotator cuff tear right shoulder Plan for Procedure(s): Right Shoulder arthroscopy with distal clavicle excision and rotator cuff repair.  The risks benefits and alternatives were discussed with the patient including but not limited to the risks of nonoperative treatment, versus surgical intervention including infection, bleeding, nerve injury, malunion, nonunion, hardware prominence, hardware failure, need for hardware removal,  blood clots, cardiopulmonary complications, morbidity, mortality, among others, and they were willing to proceed.  Predicted outcome is good, although there will be at least a six to nine month expected recovery.  Stephan Draughn L, MD 09/09/2012 11:21 AM

## 2012-09-09 NOTE — Progress Notes (Signed)
Assisted Dr. Kasik with right, ultrasound guided, interscalene  block. Side rails up, monitors on throughout procedure. See vital signs in flow sheet. Tolerated Procedure well. 

## 2012-09-09 NOTE — Transfer of Care (Signed)
Immediate Anesthesia Transfer of Care Note  Patient: [name]  Procedure(s) Performed: Procedure(s): Right Shoulder Arthroscopy with distal clavicle excision and mini-open rotator cuff repair. (Right)  Patient Location: PACU  Anesthesia Type:GA combined with regional for post-op pain  Level of Consciousness: awake and patient cooperative  Airway & Oxygen Therapy: Patient Spontanous Breathing and Patient connected to face mask oxygen  Post-op Assessment: Report given to PACU RN and Post -op Vital signs reviewed and stable  Post vital signs: Reviewed and stable  Complications: No apparent anesthesia complications

## 2012-09-09 NOTE — Anesthesia Postprocedure Evaluation (Signed)
  Anesthesia Post-op Note  Patient: [name]  Procedure(s) Performed: Procedure(s): Right Shoulder Arthroscopy with distal clavicle excision and mini-open rotator cuff repair. (Right)  Patient Location: PACU  Anesthesia Type:General  Level of Consciousness: awake  Airway and Oxygen Therapy: Patient Spontanous Breathing  Post-op Pain: none  Post-op Assessment: Post-op Vital signs reviewed and Patient's Cardiovascular Status Stable  Post-op Vital Signs: stable  Complications: No apparent anesthesia complications

## 2012-09-09 NOTE — Anesthesia Procedure Notes (Addendum)
Anesthesia Regional Block:  Interscalene brachial plexus block  Pre-Anesthetic Checklist: ,, timeout performed, Correct Patient, Correct Site, Correct Laterality, Correct Procedure, Correct Position, site marked, Risks and benefits discussed,  Surgical consent,  Pre-op evaluation,  At surgeon's request and post-op pain management  Laterality: Right  Prep: chloraprep       Needles:  Injection technique: Single-shot  Needle Type: Echogenic Stimulator Needle     Needle Length: 5cm 5 cm Needle Gauge: 22 and 22 G    Additional Needles:  Procedures: ultrasound guided (picture in chart) and nerve stimulator Interscalene brachial plexus block  Nerve Stimulator or Paresthesia:  Response: 0.48 mA,   Additional Responses:   Narrative:  Start time: 09/09/2012 9:32 AM End time: 09/09/2012 9:44 AM Injection made incrementally with aspirations every 5 mL. Anesthesiologist: Dr Gypsy Balsam  Additional Notes: 1610-960 R ISB POP CHG prep, sterile tech #22 echo/stim needle with good [place] visualization and stim down to .48ma Multiple neg asp Marc .5% w/epi 1:200000 total 25cc+decadron 4mg  infiltrated No compl Dr Gypsy Balsam   Procedure Name: Intubation Date/Time: 09/09/2012 11:59 AM Performed by: Suann Larry WOLFE Pre-anesthesia Checklist: Patient identified, Emergency Drugs available, Suction available and Patient being monitored Patient Re-evaluated:Patient Re-evaluated prior to inductionOxygen Delivery Method: Circle System Utilized Preoxygenation: Pre-oxygenation with 100% oxygen Intubation Type: IV induction Ventilation: Mask ventilation without difficulty Grade View: Grade III Tube type: Oral Tube size: 7.0 mm Number of attempts: 1 Airway Equipment and Method: stylet,  Video-laryngoscopy and LTA kit utilized Placement Confirmation: ETT inserted through vocal cords under direct vision,  positive ETCO2 and breath sounds checked- equal and bilateral Secured at: 22 cm Tube secured with:  Tape Dental Injury: Teeth and Oropharynx as per pre-operative assessment

## 2012-09-10 ENCOUNTER — Encounter (HOSPITAL_BASED_OUTPATIENT_CLINIC_OR_DEPARTMENT_OTHER): Payer: Self-pay | Admitting: Orthopedic Surgery

## 2012-09-10 NOTE — Op Note (Signed)
NAME:  [name]           ACCOUNT NO.:  [id number]  MEDICAL RECORD NO.:  [id number]  LOCATION:                               FACILITY:  [facility]  PHYSICIAN:  Harvie Junior, M.D.   DATE OF BIRTH:  [birth year]  DATE OF PROCEDURE:  09/09/2012 DATE OF DISCHARGE:  09/09/2012                              OPERATIVE REPORT   PREOPERATIVE DIAGNOSES:  Rotator cuff tear with suspected biceps tendon issues and significant pain and certainly acromioclavicular joint arthritis.  POSTOPERATIVE DIAGNOSES: 1. Supraspinatus tendon rupture. 2. Superior labral tear, anterior to posterior with intact biceps     tendon. 3. Significant acromioclavicular joint arthritis. 4. Impingement. 5. Mild glenohumeral joint chondromalacia.  PROCEDURE: 1. Arthroscopic summer of decompression from the lateral and posterior     compartment. 2. Mini open rotator cuff repair with two 55 corkscrew anchors and a     45 lateralizing push lock. 3. Arthroscopic distal clavicle resection over 20 mm from an anterior     compartment. 4. Arthroscopic debridement of superior labral tear anterior to     posterior as well as arthroscopic debridement of mild glenohumeral     joint chondromalacia.  SURGEON:  Harvie Junior, M.D.  ASSISTANT:  Marshia Ly, P.A.  ANESTHESIA:  General.  BRIEF HISTORY:  [name] is a 67 year old female with a history of having significant right shoulder pain.  We treated conservatively for a period of time with activity modification, injection therapy, use a sling.  After failure of all conservative care, MRI was obtained, which showed that she had a supraspinatus tendon tear.  We talked about treatment options and felt that arthroscopic fixation was the most appropriate course of action, and this was in fact chosen to be done.  DESCRIPTION OF PROCEDURE:  At this point, once this was accomplished, the patient was taken to the operating room.  After adequate anesthesia was obtained  with general anesthetic, the patient was placed supine on the operating table.  The right arm was then prepped and draped in the usual sterile fashion.  Following this, routine arthroscopic examination of the shoulder revealed that there was mild glenohumeral joint chondromalacia in the inferior glenoid, which was debrided.  Attention was turned towards the rotator cuff where there was noted to be a fairly significant rotator cuff tear, appeared to be almost the entire supraspinatus tendon.  There was some question about the edges of the tear.  At that point, I felt that the open repair was going to be appropriate.  The biceps tendon happily although it looked like it was not so by MRI looked great.  There was a superior labral tear anterior posterior, which was debrided, but the biceps tendon was well anchored and appeared to have good force.  At this point, attention turned out the glenohumeral joint.  The subacromial space, and anterolateral acromioplasty was performed of the lateral and posterior compartment. Distal clavicle was fairly prominent and certainly impinging the cuff. This was debrided over 20 mm from the anterior compartment and from the lateral compartment.  Once this was done, the remaining portion of the lateral clavicle was burned with an ArthroCare wand to try to prevent regrowth.  Attention  was turned back over laterally where the rotator cuff tear could be identified.  Again, there was still some question in my mind about the transitional zone, so at this point, the arthroscopic portion of the case was abandoned.  The small incision was made laterally, subcutaneous tissues down all the deltoid.  Deltoid was divided in line with its fibers.  The rotator cuff tear was identified. It appeared to be about 13 mm tear initially as we start to work on the transitional zone.  It came out to be almost a 25 mm tear, almost twice the size as we started.  At that point, we felt  2 tacks would be appropriate.  One was placed anterior and one was posterior, the 8 limbs of the suture were passed from posterior to anterior, and this gave an excellent tie down of the cuff.  These sutures were tied.  A single 45 push lock was used.  We checked whether we needed a front to back or just a single-point laterally.  It appeared to be single-point laterally was the best.  A push lock was used at this point, and this was locked down.  Excellent repair was achieved.  No bunching of the cuff, excellent motion, mobility.  The acromioplasty was felt with the glove finger appropriate.  The distal clavicle felt this was good.  I irrigated thoroughly the subacromial space, and at this point, closed the deltoid with 1 Vicryl running, skin with 0 and 2-0 Vicryl and 3-0 Monocryl subcuticular.  Benzoin and Steri-Strips were applied.  Sterile compressive dressing was applied.  The patient was taken to recovery, was noted to be in satisfactory condition.  Estimated blood loss for the procedure was minimal.     Harvie Junior, M.D.     Ranae Plumber  D:  09/09/2012  T:  09/10/2012  Job:  [id number]

## 2015-09-18 ENCOUNTER — Ambulatory Visit: Payer: Medicare Other | Admitting: Physical Therapy

## 2015-09-20 ENCOUNTER — Ambulatory Visit: Payer: Medicare Other | Attending: Rheumatology | Admitting: Physical Therapy

## 2015-09-20 DIAGNOSIS — M25551 Pain in right hip: Secondary | ICD-10-CM | POA: Diagnosis present

## 2015-09-20 DIAGNOSIS — M25552 Pain in left hip: Secondary | ICD-10-CM | POA: Insufficient documentation

## 2015-09-20 NOTE — Therapy (Signed)
[facility] [address] [place], [place], [address] Phone: [phone]   Fax:  [phone]  Physical Therapy Evaluation  Patient Details  Name: Burgess Amoratricia C Mankey MRN: [id number] Date of Birth: 12/02/1945 Referring Provider: Melvenia NeedlesShali Deveshwar MD.  Encounter Date: 09/20/2015      PT End of Session - 09/20/15 1738    Visit Number 1   Number of Visits 16   Date for PT Re-Evaluation 11/19/15   PT Start Time 0145   PT Stop Time 0238   PT Time Calculation (min) 53 min   Activity Tolerance Patient tolerated treatment well   Behavior [facility] for tasks assessed/performed      Past Medical History  Diagnosis Date  . Hypertension   . GERD (gastroesophageal reflux disease)   . Arthritis   . Sleep apnea     uses a cpap  . Hyperlipemia   . Wears glasses     Past Surgical History  Procedure Laterality Date  . Cervical fusion  2007  . Tonsillectomy    . Abdominal hysterectomy  1999  . Colonoscopy    . Breast surgery  2002    rt br bx  . Cholecystectomy  2005    lap choli  . Finger ganglion cyst excision    . Shoulder arthroscopy with open rotator cuff repair and distal clavicle acrominectomy Right 09/09/2012    Procedure: Right Shoulder Arthroscopy with distal clavicle excision and mini-open rotator cuff repair.;  Surgeon: Harvie JuniorJohn L Graves, MD;  Location: [facility];  Service: Orthopedics;  Laterality: Right;    There were no vitals filed for this visit.       Subjective Assessment - 09/20/15 1355    Subjective My left hip is the worst.  Can't sleep on this side.   Pertinent History Nerve injury in right ankle.  Has to wear a brace.   Limitations Standing;Walking   How long can you stand comfortably? 10-15 minutes.   How long can you walk comfortably? Short distances.   Currently in Pain? Yes   Pain Score 7    Pain Location Hip   Pain Orientation Left;Right   Pain Descriptors / Indicators Aching   Pain  Type Chronic pain   Pain Onset More than a month ago   Pain Frequency Constant   Aggravating Factors  Lying on side.   Pain Relieving Factors Heat.            [facility] - 09/20/15 0001    Assessment   Medical Diagnosis Bil trochanteric bursitis.   Referring Provider Melvenia NeedlesShali Deveshwar MD.   Onset Date/Surgical Date --  5 years.   Hand Dominance Right   Precautions   Precautions None   Required Braces or Orthoses Other Brace/Splint   Other Brace/Splint --  Right ankle.   Restrictions   Weight Bearing Restrictions No   Balance Screen   Has the patient fallen in the past 6 months Yes   How many times? --  2 trips.   Has the patient had a decrease in activity level because of a fear of falling?  No   Is the patient reluctant to leave their home because of a fear of falling?  No   Home [employer] information [employer] managernvironment   Living Environment Private residence   Prior Function   Level of Independence Independent   Posture/Postural Control   Posture/Postural Control Postural limitations   Posture Comments Left iliac crest higher than right with compensatory lumbar scoliosis noted.  Left LE ER.   ROM / Strength   AROM / PROM / Strength AROM;Strength   AROM   Overall AROM Comments Intervertebral movement into flexion decreased by 75% and active lumbar extension limited to 10 degrees.   Strength   Overall Strength Comments Left hip abduction= 4-/5.   Palpation   Palpation comment Diffuse pain reported over lumbar area bilaterally.  Verty tender to palpation over and around the patient's left greater trochanter.   Ambulation/Gait   Gait Comments Trendelenburg gait pattern.                   [facility] Adult PT Treatment/Exercise - 09/20/15 0001    Modalities   Modalities Electrical Stimulation   Electrical Stimulation   Electrical Stimulation Location Bilateral lower lumbar region and left hip trochanteric region.   Electrical Stimulation Action Pre-mod    Electrical Stimulation  Parameters Constant  80-150 Hz. x 15 minutes.   Electrical Stimulation Goals Pain                  PT Short Term Goals - 09/20/15 1748    PT SHORT TERM GOAL #1   Title Ind with a HEP.   Time 3   Period Weeks   Status New           PT Long Term Goals - 09/20/15 1748    PT LONG TERM GOAL #1   Title Sleep 6 hours undisturbed.   Time 8   Period Weeks   Status New   PT LONG TERM GOAL #2   Title Stand 20 minutes with pain not > 3/10.   Time 8   Period Weeks   Status New   PT LONG TERM GOAL #3   Title Reports a 50% subjective improvement rating when transitioning from sit to stand.   Time 8   Period Weeks   Status New   PT LONG TERM GOAL #4   Title Sit 30 minutes with pain not > 3/10.   Time 8   Period Weeks   Status New   PT LONG TERM GOAL #5   Title Perform ADL's with pain not > 3/10.   Period Weeks   Status New               Plan - 09/20/15 1739    Clinical Impression Statement The patient has a 5 year+ h/o low back pain and hip pain left > right.  She is unable to sleep on her left side due to aching left hip pain.  She has had injections but that have not been very helpful.  her rated pain-level is a 7/10 today.  Pain increases when sitting and then getting up.  Her pain decreases after she walks a short distance.  However, if sh walks too long her pain increases.   Rehab Potential Good   PT Frequency 2x / week   PT Duration 8 weeks   PT Treatment/Interventions ADLs/Self Care Home Management;Electrical Stimulation;Moist Heat;Ultrasound;Therapeutic activities;Therapeutic exercise;Patient/family education;Manual techniques;Traction   PT Next Visit Plan Sending order for [place] (please look under "other orders" tab.  Modalities to bilateral hip and as well as STW/M to the hips and low back.  AP mobs to lumbar spine.  Int traction is a treatment option begining at 40% bodyweight if not responding to aforementioned treatment.  Bilateral hip abduction  exercises.   Consulted and Agree with Plan of Care Patient      Patient will benefit from skilled therapeutic intervention  in order to improve the following deficits and impairments:  Pain, Decreased activity tolerance, Decreased range of motion  Visit Diagnosis: Pain in left hip - Plan: PT plan of care cert/re-cert  Pain in right hip - Plan: PT plan of care cert/re-cert      G-Codes - [id number]    Functional Assessment Tool Used FOTO.Marland KitchenMarland KitchenMarland Kitchen54% limitation.   Functional Limitation Mobility: Walking and moving around   Mobility: Walking and Moving Around Current Status [phone]) At least 40 percent but less than 60 percent impaired, limited or restricted   Mobility: Walking and Moving Around Goal Status [phone]) At least 20 percent but less than 40 percent impaired, limited or restricted       Problem List There are no active problems to display for this patient.   Romaine Neville, [place] MPT 09/20/2015, 5:59 PM  [facility] [address] [place], [place], [address] Phone: [phone]   Fax:  [phone]  Name: PRISEIS DESPAIN MRN: [id number] Date of Birth: May 15, 1945

## 2015-09-27 ENCOUNTER — Ambulatory Visit: Payer: Medicare Other | Admitting: Physical Therapy

## 2015-09-28 ENCOUNTER — Encounter: Payer: Medicare Other | Admitting: *Deleted

## 2015-10-02 ENCOUNTER — Ambulatory Visit: Payer: Medicare Other | Attending: Rheumatology | Admitting: Physical Therapy

## 2015-10-02 ENCOUNTER — Encounter: Payer: Self-pay | Admitting: Physical Therapy

## 2015-10-02 DIAGNOSIS — M25551 Pain in right hip: Secondary | ICD-10-CM

## 2015-10-02 DIAGNOSIS — M25552 Pain in left hip: Secondary | ICD-10-CM | POA: Diagnosis present

## 2015-10-02 NOTE — Therapy (Signed)
[facility] [place], [place], [address] Phone: [phone]   Fax:  [phone]  Physical Therapy Treatment  Patient Details  Name: [name] MRN: [id number] Date of Birth: Dec 13, 1945 Referring Provider: Cy Blamer MD.  Encounter Date: 10/02/2015      PT End of Session - 10/02/15 0942    Visit Number 2   Number of Visits 16   Date for PT Re-Evaluation 11/19/15   PT Start Time 0950   PT Stop Time 1035   PT Time Calculation (min) 45 min   Activity Tolerance Patient tolerated treatment well   Behavior During Therapy [facility] for tasks assessed/performed      Past Medical History  Diagnosis Date  . Hypertension   . GERD (gastroesophageal reflux disease)   . Arthritis   . Sleep apnea     uses a cpap  . Hyperlipemia   . Wears glasses     Past Surgical History  Procedure Laterality Date  . Cervical fusion  2007  . Tonsillectomy    . Abdominal hysterectomy  1999  . Colonoscopy    . Breast surgery  2002    rt br bx  . Cholecystectomy  2005    lap choli  . Finger ganglion cyst excision    . Shoulder arthroscopy with open rotator cuff repair and distal clavicle acrominectomy Right 09/09/2012    Procedure: Right Shoulder Arthroscopy with distal clavicle excision and mini-open rotator cuff repair.;  Surgeon: Alta Corning, MD;  Location: [facility];  Service: Orthopedics;  Laterality: Right;    There were no vitals filed for this visit.      Subjective Assessment - 10/02/15 0941    Subjective Reports that she is able to walk today and reports only pain in low back. Reports that she still has soreness along L hip and still cannot sleep on R hip.   Pertinent History Nerve injury in right ankle.  Has to wear a brace.   Limitations Standing;Walking   How long can you stand comfortably? 10-15 minutes.   How long can you walk comfortably? Short distances.   Currently in Pain? Yes   Pain Score 5     Pain Location Back   Pain Orientation Lower   Pain Descriptors / Indicators Dull;Aching   Pain Type Chronic pain   Pain Onset More than a month ago            [facility] PT Assessment - 10/02/15 0001    Assessment   Medical Diagnosis Bil trochanteric bursitis.   Hand Dominance Right   Precautions   Precautions None   Required Braces or Orthoses Other Brace/Splint   Restrictions   Weight Bearing Restrictions No                     [facility] Adult PT Treatment/Exercise - 10/02/15 0001    Modalities   Modalities Electrical Stimulation;Moist Heat;Ultrasound   Moist Heat Therapy   Number Minutes Moist Heat 15 Minutes   Moist Heat Location Lumbar Spine;Hip   Electrical Stimulation   Electrical Stimulation Location B hip   Electrical Stimulation Action Pre-Mod for each hip   Electrical Stimulation Parameters 80-150 Hz x15 min   Electrical Stimulation Goals Pain   Ultrasound   Ultrasound Location L Hip   Ultrasound Parameters 1.5 w/cm2, 100%, 37m hz x10 min   Ultrasound Goals Pain   Manual Therapy   Manual Therapy Myofascial release   Myofascial Release MFR/STW  to L lumbar paraspinals, Glute, hip musculature to decrease pain and discomfort                  PT Short Term Goals - 09/20/15 1748    PT SHORT TERM GOAL #1   Title Ind with a HEP.   Time 3   Period Weeks   Status New           PT Long Term Goals - 09/20/15 1748    PT LONG TERM GOAL #1   Title Sleep 6 hours undisturbed.   Time 8   Period Weeks   Status New   PT LONG TERM GOAL #2   Title Stand 20 minutes with pain not > 3/10.   Time 8   Period Weeks   Status New   PT LONG TERM GOAL #3   Title Reports a 50% subjective improvement rating when transitioning from sit to stand.   Time 8   Period Weeks   Status New   PT LONG TERM GOAL #4   Title Sit 30 minutes with pain not > 3/10.   Time 8   Period Weeks   Status New   PT LONG TERM GOAL #5   Title Perform ADL's with pain not >  3/10.   Period Weeks   Status New               Plan - 10/02/15 [id number]    Clinical Impression Statement Patient arrived to treatment with no reports of hip pain at arrival to clinic only of low back pain. Patient reported L hip and Glute tenderness to palpation during manual therapy. Patient demonstrated increased tightness around L greater trochanter and into Glute musculature. Normal modalites response noted following removal of the modalities. Patient denied pain following today's treatment.   Rehab Potential Good   PT Frequency 2x / week   PT Duration 8 weeks   PT Treatment/Interventions ADLs/Self Care Home Management;Electrical Stimulation;Moist Heat;Ultrasound;Therapeutic activities;Therapeutic exercise;Patient/family education;Manual techniques;Traction   PT Next Visit Plan Sending order for [place] (please look under "other orders" tab.  Modalities to bilateral hip and as well as STW/M to the hips and low back.  AP mobs to lumbar spine.  Int traction is a treatment option begining at 40% bodyweight if not responding to aforementioned treatment.  Bilateral hip abduction exercises.   Consulted and Agree with Plan of Care Patient      Patient will benefit from skilled therapeutic intervention in order to improve the following deficits and impairments:  Pain, Decreased activity tolerance, Decreased range of motion  Visit Diagnosis: Pain in left hip  Pain in right hip     Problem List There are no active problems to display for this patient.   Wynelle Fanny, PTA 10/02/2015, 10:42 AM  [facility] [address] [place], [place], [address] Phone: [phone]   Fax:  [phone]  Name: [name] MRN: [id number] Date of Birth: 1945/07/07

## 2015-10-04 ENCOUNTER — Ambulatory Visit: Payer: Medicare Other | Admitting: Physical Therapy

## 2015-10-04 ENCOUNTER — Encounter: Payer: Self-pay | Admitting: Physical Therapy

## 2015-10-04 DIAGNOSIS — M25551 Pain in right hip: Secondary | ICD-10-CM

## 2015-10-04 DIAGNOSIS — M25552 Pain in left hip: Secondary | ICD-10-CM | POA: Diagnosis not present

## 2015-10-04 NOTE — Therapy (Signed)
[facility] [place], [place], [address] Phone: [phone]   Fax:  [phone]  Physical Therapy Treatment  Patient Details  Name: [name] MRN: [id number] Date of Birth: Jul 30, 1945 Referring Provider: Cy Blamer MD.  Encounter Date: 10/04/2015      PT End of Session - 10/04/15 1418    Visit Number 3   Number of Visits 16   Date for PT Re-Evaluation 11/19/15   PT Start Time 1344   PT Stop Time 1427   PT Time Calculation (min) 43 min   Activity Tolerance Patient tolerated treatment well   Behavior During Therapy [facility] for tasks assessed/performed      Past Medical History  Diagnosis Date  . Hypertension   . GERD (gastroesophageal reflux disease)   . Arthritis   . Sleep apnea     uses a cpap  . Hyperlipemia   . Wears glasses     Past Surgical History  Procedure Laterality Date  . Cervical fusion  2007  . Tonsillectomy    . Abdominal hysterectomy  1999  . Colonoscopy    . Breast surgery  2002    rt br bx  . Cholecystectomy  2005    lap choli  . Finger ganglion cyst excision    . Shoulder arthroscopy with open rotator cuff repair and distal clavicle acrominectomy Right 09/09/2012    Procedure: Right Shoulder Arthroscopy with distal clavicle excision and mini-open rotator cuff repair.;  Surgeon: Alta Corning, MD;  Location: [facility];  Service: Orthopedics;  Laterality: Right;    There were no vitals filed for this visit.      Subjective Assessment - 10/04/15 1338    Subjective Reports that she could not believe how good she felt following previous treatment. States that she tried to lay on L hip and didn't hurt but didn't stay in that position long as not to bring pain back on.   Pertinent History Nerve injury in right ankle.  Has to wear a brace.   Limitations Standing;Walking   How long can you stand comfortably? 10-15 minutes.   How long can you walk comfortably? Short  distances.   Currently in Pain? Yes   Pain Score 3    Pain Location Hip   Pain Orientation Left   Pain Descriptors / Indicators Tender   Pain Type Chronic pain   Pain Onset More than a month ago            [facility] PT Assessment - 10/04/15 0001    Assessment   Medical Diagnosis Bil trochanteric bursitis.   Hand Dominance Right   Precautions   Precautions None   Required Braces or Orthoses Other Brace/Splint   Restrictions   Weight Bearing Restrictions No                     [facility] Adult PT Treatment/Exercise - 10/04/15 0001    Modalities   Modalities Electrical Stimulation;Moist Heat;Ultrasound   Moist Heat Therapy   Number Minutes Moist Heat 15 Minutes   Moist Heat Location Lumbar Spine;Hip   Electrical Stimulation   Electrical Stimulation Location L hip   Electrical Stimulation Action Pre-Mod   Electrical Stimulation Parameters 80-150 hz x15 min   Electrical Stimulation Goals Pain   Ultrasound   Ultrasound Location L hip   Ultrasound Parameters 1.5 w/cm2, 100%,1 mhz x10 min   Ultrasound Goals Pain   Manual Therapy   Manual Therapy Myofascial release  Myofascial Release MFR/STW to L lateral hip musculature to decrease pain and discomfort                  PT Short Term Goals - 09/20/15 1748    PT SHORT TERM GOAL #1   Title Ind with a HEP.   Time 3   Period Weeks   Status New           PT Long Term Goals - 09/20/15 1748    PT LONG TERM GOAL #1   Title Sleep 6 hours undisturbed.   Time 8   Period Weeks   Status New   PT LONG TERM GOAL #2   Title Stand 20 minutes with pain not > 3/10.   Time 8   Period Weeks   Status New   PT LONG TERM GOAL #3   Title Reports a 50% subjective improvement rating when transitioning from sit to stand.   Time 8   Period Weeks   Status New   PT LONG TERM GOAL #4   Title Sit 30 minutes with pain not > 3/10.   Time 8   Period Weeks   Status New   PT LONG TERM GOAL #5   Title Perform ADL's with  pain not > 3/10.   Period Weeks   Status New               Plan - 10/04/15 1426    Clinical Impression Statement Patient arrived to clinic today with reports of great benefit from previous treatment with only reports of tenderness of L hip. Patient was educated regarding the use of manual therapy and the modalities for her condition at this time and future direction of PT with decreasing pain. Patient demonstrated moderate tightness around L superior ITB and Glute Medius. Normal modaities response noted followng removal of the modalities. Patient denied L hip pain upon end of treatment.   Rehab Potential Good   PT Frequency 2x / week   PT Duration 8 weeks   PT Treatment/Interventions ADLs/Self Care Home Management;Electrical Stimulation;Moist Heat;Ultrasound;Therapeutic activities;Therapeutic exercise;Patient/family education;Manual techniques;Traction   PT Next Visit Plan Continue modalites and manual therapy at this time as symptoms dictate and progress into therapeutic exercise. Continues to monitor for iontophoresis sign off.   Consulted and Agree with Plan of Care Patient      Patient will benefit from skilled therapeutic intervention in order to improve the following deficits and impairments:  Pain, Decreased activity tolerance, Decreased range of motion  Visit Diagnosis: Pain in left hip  Pain in right hip     Problem List There are no active problems to display for this patient.   Wynelle Fanny, PTA 10/04/2015, 2:39 PM  [facility] [address] [place], [place], [address] Phone: [phone]   Fax:  [phone]  Name: [name] MRN: [id number] Date of Birth: [birth year]

## 2015-10-10 ENCOUNTER — Ambulatory Visit: Payer: Medicare Other | Admitting: Physical Therapy

## 2015-10-10 DIAGNOSIS — M25552 Pain in left hip: Secondary | ICD-10-CM

## 2015-10-10 NOTE — Patient Instructions (Signed)
Iliotibial Band Stretch, Side-Lying   Lie on side, back to edge of bed, top arm in front. Allow top leg to drape behind over edge. Hold 30 or more seconds.  Repeat 3 times per session. Do _2-3 sessions per day.  Stretching: Piriformis (Supine)  Pull right knee toward opposite shoulder. Hold _30___ seconds. Relax. Repeat _3___ times per set. Do ____ sets per session. Do __3 __ sessions per day.  Madelyn Flavors, PT 10/10/2015 9:42 AM [facility] [address] [place], [place], [address] Phone: [phone]   Fax:  [phone]

## 2015-10-10 NOTE — Therapy (Signed)
[facility] [place], [place], [address] Phone: [phone]   Fax:  [phone]  Physical Therapy Treatment  Patient Details  Name: [name] MRN: [id number] Date of Birth: 1945-10-07 Referring Provider: Cy Blamer MD.  Encounter Date: 10/10/2015      PT End of Session - 10/10/15 0905    Visit Number 4   Number of Visits 16   Date for PT Re-Evaluation 11/19/15   PT Start Time 0904   PT Stop Time 1004   PT Time Calculation (min) 60 min   Activity Tolerance Patient tolerated treatment well   Behavior During Therapy [facility] for tasks assessed/performed      Past Medical History  Diagnosis Date  . Hypertension   . GERD (gastroesophageal reflux disease)   . Arthritis   . Sleep apnea     uses a cpap  . Hyperlipemia   . Wears glasses     Past Surgical History  Procedure Laterality Date  . Cervical fusion  2007  . Tonsillectomy    . Abdominal hysterectomy  1999  . Colonoscopy    . Breast surgery  2002    rt br bx  . Cholecystectomy  2005    lap choli  . Finger ganglion cyst excision    . Shoulder arthroscopy with open rotator cuff repair and distal clavicle acrominectomy Right 09/09/2012    Procedure: Right Shoulder Arthroscopy with distal clavicle excision and mini-open rotator cuff repair.;  Surgeon: Alta Corning, MD;  Location: [facility];  Service: Orthopedics;  Laterality: Right;    There were no vitals filed for this visit.      Subjective Assessment - 10/10/15 0905    Subjective Patient states her left hip has been feeling better. The right isnt bothering her right now. Patient was able to tolerate being on her feet all morning at church on Sunday without significant pain.   Pertinent History Nerve injury in right ankle.  Has to wear a brace.   Limitations Standing;Walking   How long can you stand comfortably? 2 hours   How long can you walk comfortably? Short distances.   Currently in Pain? Yes   Pain Score 3    Pain Location Hip   Pain Orientation Left   Pain Descriptors / Indicators Tender   Pain Type Chronic pain   Pain Onset More than a month ago   Pain Frequency Constant   Aggravating Factors  lying on side   Pain Relieving Factors heat                         [facility] Adult PT Treatment/Exercise - 10/10/15 0001    Modalities   Modalities Electrical Stimulation;Moist Heat;Ultrasound   Moist Heat Therapy   Number Minutes Moist Heat 15 Minutes   Moist Heat Location Hip;Other (comment)  lateral thigh   Electrical Stimulation   Electrical Stimulation Location L hip and ITB   Electrical Stimulation Action IFC   Electrical Stimulation Parameters 80-150 HZ t tolerance x 15 min   Electrical Stimulation Goals Pain   Ultrasound   Ultrasound Location L hip   Ultrasound Parameters 0.5 w/cm2 1 Mhz 20% duty to L hip x 10 min   Ultrasound Goals Pain   Manual Therapy   Manual Therapy Myofascial release;Soft tissue mobilization   Myofascial Release MFR/STW to L lateral hip musculature and ITB  PT Education - 10/10/15 1542    Education provided Yes   Education Details HEP   Person(s) Educated Patient   Methods Explanation;Demonstration;Handout   Comprehension Verbalized understanding;Returned demonstration          PT Short Term Goals - 09/20/15 1748    PT SHORT TERM GOAL #1   Title Ind with a HEP.   Time 3   Period Weeks   Status New           PT Long Term Goals - 09/20/15 1748    PT LONG TERM GOAL #1   Title Sleep 6 hours undisturbed.   Time 8   Period Weeks   Status New   PT LONG TERM GOAL #2   Title Stand 20 minutes with pain not > 3/10.   Time 8   Period Weeks   Status New   PT LONG TERM GOAL #3   Title Reports a 50% subjective improvement rating when transitioning from sit to stand.   Time 8   Period Weeks   Status New   PT LONG TERM GOAL #4   Title Sit 30 minutes with pain not >  3/10.   Time 8   Period Weeks   Status New   PT LONG TERM GOAL #5   Title Perform ADL's with pain not > 3/10.   Period Weeks   Status New               Plan - 10/10/15 1543    Clinical Impression Statement Patient continues to respond well to treatment. She tolerated manual fairly well and did well with stretches. Her tolerance to standing has improved significantly since eval. Remaining goals are ongoing.   Rehab Potential Good   PT Frequency 2x / week   PT Duration 8 weeks   PT Treatment/Interventions ADLs/Self Care Home Management;Electrical Stimulation;Moist Heat;Ultrasound;Therapeutic activities;Therapeutic exercise;Patient/family education;Manual techniques;Traction   PT Next Visit Plan Continue modalites and manual therapy at this time as symptoms dictate and progress into therapeutic exercise. Continues to monitor for iontophoresis sign off.   PT Home Exercise Plan piriformis and ITB stretches   Consulted and Agree with Plan of Care Patient      Patient will benefit from skilled therapeutic intervention in order to improve the following deficits and impairments:  Pain, Decreased activity tolerance, Decreased range of motion  Visit Diagnosis: Pain in left hip     Problem List There are no active problems to display for this patient.   Madelyn Flavors PT  10/10/2015, 3:46 PM  [facility] [place], [place], [address] Phone: [phone]   Fax:  [phone]  Name: [name] MRN: [id number] Date of Birth: April 22, 1946

## 2015-10-12 ENCOUNTER — Ambulatory Visit: Payer: Medicare Other | Admitting: Physical Therapy

## 2015-10-12 ENCOUNTER — Encounter: Payer: Self-pay | Admitting: Physical Therapy

## 2015-10-12 DIAGNOSIS — M25552 Pain in left hip: Secondary | ICD-10-CM

## 2015-10-12 DIAGNOSIS — M25551 Pain in right hip: Secondary | ICD-10-CM

## 2015-10-12 NOTE — Therapy (Signed)
[facility] [place], [place], [address] Phone: [phone]   Fax:  [phone]  Physical Therapy Treatment  Patient Details  Name: MIRAI DRAIN MRN: [id number] Date of Birth: 04-04-1946 Referring Provider: Cy Blamer MD.  Encounter Date: 10/12/2015      PT End of Session - 10/12/15 0859    Visit Number 5   Number of Visits 16   Date for PT Re-Evaluation 11/19/15   PT Start Time 0903   PT Stop Time 0950   PT Time Calculation (min) 47 min   Activity Tolerance Patient tolerated treatment well   Behavior During Therapy [facility] for tasks assessed/performed      Past Medical History  Diagnosis Date  . Hypertension   . GERD (gastroesophageal reflux disease)   . Arthritis   . Sleep apnea     uses a cpap  . Hyperlipemia   . Wears glasses     Past Surgical History  Procedure Laterality Date  . Cervical fusion  2007  . Tonsillectomy    . Abdominal hysterectomy  1999  . Colonoscopy    . Breast surgery  2002    rt br bx  . Cholecystectomy  2005    lap choli  . Finger ganglion cyst excision    . Shoulder arthroscopy with open rotator cuff repair and distal clavicle acrominectomy Right 09/09/2012    Procedure: Right Shoulder Arthroscopy with distal clavicle excision and mini-open rotator cuff repair.;  Surgeon: Alta Corning, MD;  Location: [facility];  Service: Orthopedics;  Laterality: Right;    There were no vitals filed for this visit.      Subjective Assessment - 10/12/15 0859    Subjective L hip is doing better but still cannot stand long per patient report. Reports she had a busy day yesterday with running errands and activities.   Pertinent History Nerve injury in right ankle.  Has to wear a brace.   Limitations Standing;Walking   How long can you stand comfortably? 2 hours   How long can you walk comfortably? Short distances.   Currently in Pain? Yes   Pain Score 3    Pain Location  Hip   Pain Orientation Left   Pain Descriptors / Indicators Tender   Pain Type Chronic pain            [facility] PT Assessment - 10/12/15 0001    Assessment   Medical Diagnosis Bil trochanteric bursitis.   Hand Dominance Right   Precautions   Precautions None   Required Braces or Orthoses Other Brace/Splint   Restrictions   Weight Bearing Restrictions No                     [facility] Adult PT Treatment/Exercise - 10/12/15 0001    Modalities   Modalities Electrical Stimulation;Moist Heat;Ultrasound   Moist Heat Therapy   Number Minutes Moist Heat 15 Minutes   Moist Heat Location Hip   Electrical Stimulation   Electrical Stimulation Location L hip   Electrical Stimulation Action Pre-Mod   Electrical Stimulation Parameters 80-150 hz x15 min   Electrical Stimulation Goals Pain   Ultrasound   Ultrasound Location L hip   Ultrasound Parameters 1.5 w/cm2, 100%, 1 mhzx10 min   Ultrasound Goals Pain   Manual Therapy   Manual Therapy Myofascial release   Myofascial Release MFR/STW to L Glute Max, Glute Medius, ITB to decrease pain and tightness  PT Short Term Goals - 09/20/15 1748    PT SHORT TERM GOAL #1   Title Ind with a HEP.   Time 3   Period Weeks   Status New           PT Long Term Goals - 09/20/15 1748    PT LONG TERM GOAL #1   Title Sleep 6 hours undisturbed.   Time 8   Period Weeks   Status New   PT LONG TERM GOAL #2   Title Stand 20 minutes with pain not > 3/10.   Time 8   Period Weeks   Status New   PT LONG TERM GOAL #3   Title Reports a 50% subjective improvement rating when transitioning from sit to stand.   Time 8   Period Weeks   Status New   PT LONG TERM GOAL #4   Title Sit 30 minutes with pain not > 3/10.   Time 8   Period Weeks   Status New   PT LONG TERM GOAL #5   Title Perform ADL's with pain not > 3/10.   Period Weeks   Status New               Plan - 10/12/15 0936    Clinical Impression  Statement Patient tolerated today's treatment well with no reports of increased soreness with manual therapy today. Normal modalites response noted following removal of the modalities. Patient presented in clinic with tightness noted in L Glute Max and Medius today upon palpation. Patient also presented with minimal tightness in superior L ITB. Goals remain on-going at this time secondary to pain experienced. Patient denied pain following today's treatment.   Rehab Potential Good   PT Frequency 2x / week   PT Duration 8 weeks   PT Treatment/Interventions ADLs/Self Care Home Management;Electrical Stimulation;Moist Heat;Ultrasound;Therapeutic activities;Therapeutic exercise;Patient/family education;Manual techniques;Traction   PT Next Visit Plan Continue modalites and manual therapy at this time as symptoms dictate and progress into therapeutic exercise. Continues to monitor for iontophoresis sign off.   PT Home Exercise Plan piriformis and ITB stretches   Consulted and Agree with Plan of Care Patient      Patient will benefit from skilled therapeutic intervention in order to improve the following deficits and impairments:  Pain, Decreased activity tolerance, Decreased range of motion  Visit Diagnosis: Pain in left hip  Pain in right hip     Problem List There are no active problems to display for this patient.   Wynelle Fanny, PTA 10/12/2015, 10:29 AM  [facility] [address] [place], [place], [address] Phone: [phone]   Fax:  [phone]  Name: JOLETA SHOENER MRN: [id number] Date of Birth: 1945/12/08

## 2015-10-17 ENCOUNTER — Ambulatory Visit: Payer: Medicare Other | Admitting: *Deleted

## 2015-10-17 DIAGNOSIS — M25551 Pain in right hip: Secondary | ICD-10-CM

## 2015-10-17 DIAGNOSIS — M25552 Pain in left hip: Secondary | ICD-10-CM

## 2015-10-17 NOTE — Therapy (Signed)
[facility] [place], [place], [address] Phone: [phone]   Fax:  [phone]  Physical Therapy Treatment  Patient Details  Name: [name] MRN: [id number] Date of Birth: 01/13/1946 Referring Provider: Cy Blamer MD.  Encounter Date: 10/17/2015      PT End of Session - 10/17/15 0948    Visit Number 6   Number of Visits 16   Date for PT Re-Evaluation 11/19/15   PT Start Time 0945   PT Stop Time 1037   PT Time Calculation (min) 52 min      Past Medical History  Diagnosis Date  . Hypertension   . GERD (gastroesophageal reflux disease)   . Arthritis   . Sleep apnea     uses a cpap  . Hyperlipemia   . Wears glasses     Past Surgical History  Procedure Laterality Date  . Cervical fusion  2007  . Tonsillectomy    . Abdominal hysterectomy  1999  . Colonoscopy    . Breast surgery  2002    rt br bx  . Cholecystectomy  2005    lap choli  . Finger ganglion cyst excision    . Shoulder arthroscopy with open rotator cuff repair and distal clavicle acrominectomy Right 09/09/2012    Procedure: Right Shoulder Arthroscopy with distal clavicle excision and mini-open rotator cuff repair.;  Surgeon: Alta Corning, MD;  Location: [place];  Service: Orthopedics;  Laterality: Right;    There were no vitals filed for this visit.      Subjective Assessment - 10/17/15 0946    Subjective L hip is doing better but still cannot stand long per patient report. Reports she had a busy day yesterday with running errands and activities.Have been able to roll to LT side a little   Pertinent History Nerve injury in right ankle.  Has to wear a brace.   Limitations Standing;Walking   How long can you stand comfortably? 2 hours   How long can you walk comfortably? Short distances.   Currently in Pain? Yes   Pain Score 3    Pain Location Hip   Pain Orientation Left   Pain Descriptors / Indicators Tender   Pain Type Chronic pain   Pain Onset More than a month ago   Pain Frequency Constant                         [facility] Adult PT Treatment/Exercise - 10/17/15 0001    Modalities   Modalities Electrical Stimulation;Moist Heat;Ultrasound   Moist Heat Therapy   Number Minutes Moist Heat 15 Minutes   Moist Heat Location Hip   Electrical Stimulation   Electrical Stimulation Location L hip   Electrical Stimulation Action Premod   Electrical Stimulation Parameters 80-150hz  x 15 mins   Electrical Stimulation Goals Pain   Ultrasound   Ultrasound Location LT hip   Ultrasound Parameters 1.5 w/cm2 x 10 mins    Ultrasound Goals Pain   Manual Therapy   Manual Therapy Myofascial release   Myofascial Release MFR/STW to L Glute Max, Glute Medius, ITB to decrease pain and tightness                  PT Short Term Goals - 09/20/15 1748    PT SHORT TERM GOAL #1   Title Ind with a HEP.   Time 3   Period Weeks   Status New  PT Long Term Goals - 09/20/15 1748    PT LONG TERM GOAL #1   Title Sleep 6 hours undisturbed.   Time 8   Period Weeks   Status New   PT LONG TERM GOAL #2   Title Stand 20 minutes with pain not > 3/10.   Time 8   Period Weeks   Status New   PT LONG TERM GOAL #3   Title Reports a 50% subjective improvement rating when transitioning from sit to stand.   Time 8   Period Weeks   Status New   PT LONG TERM GOAL #4   Title Sit 30 minutes with pain not > 3/10.   Time 8   Period Weeks   Status New   PT LONG TERM GOAL #5   Title Perform ADL's with pain not > 3/10.   Period Weeks   Status New               Plan - 10/17/15 1029    Clinical Impression Statement Pt did fairly well today and feels that she is progressing with less pain. She is able to roll over in bed on LT hip now, but still doesn't try to sleep on it. She still has notable tightness in LT ITB and glute observed during STW. Normal modality response today after  removal of modalities.   Rehab Potential Good   PT Frequency 2x / week   PT Next Visit Plan Continue modalites and manual therapy at this time as symptoms dictate and progress into therapeutic exercise. Continues to monitor for iontophoresis sign off.   PT Home Exercise Plan piriformis and ITB stretches   Consulted and Agree with Plan of Care Patient      Patient will benefit from skilled therapeutic intervention in order to improve the following deficits and impairments:  Pain, Decreased activity tolerance, Decreased range of motion  Visit Diagnosis: Pain in left hip  Pain in right hip     Problem List There are no active problems to display for this patient.   Ashan Cueva,CHRIS, PTA 10/17/2015, 1:32 PM  [facility] [address] [place], [place], [address] Phone: [phone]   Fax:  [phone]  Name: [name] MRN: [id number] Date of Birth: 1945-08-05

## 2015-10-19 ENCOUNTER — Ambulatory Visit: Payer: Medicare Other | Admitting: Physical Therapy

## 2015-10-19 ENCOUNTER — Encounter: Payer: Self-pay | Admitting: Physical Therapy

## 2015-10-19 DIAGNOSIS — M25552 Pain in left hip: Secondary | ICD-10-CM

## 2015-10-19 DIAGNOSIS — M25551 Pain in right hip: Secondary | ICD-10-CM

## 2015-10-19 NOTE — Therapy (Signed)
[facility] [place], [place], [address] Phone: [phone]   Fax:  [phone]  Physical Therapy Treatment  Patient Details  Name: DELZORA ADOLF MRN: [id number] Date of Birth: 12-Jun-1945 Referring Provider: Cy Blamer MD.  Encounter Date: 10/19/2015      PT End of Session - 10/19/15 0938    Visit Number 7   Number of Visits 16   Date for PT Re-Evaluation 11/19/15   PT Start Time 0946   PT Stop Time 1029   PT Time Calculation (min) 43 min   Activity Tolerance Patient tolerated treatment well   Behavior During Therapy [facility] for tasks assessed/performed      Past Medical History  Diagnosis Date  . Hypertension   . GERD (gastroesophageal reflux disease)   . Arthritis   . Sleep apnea     uses a cpap  . Hyperlipemia   . Wears glasses     Past Surgical History  Procedure Laterality Date  . Cervical fusion  2007  . Tonsillectomy    . Abdominal hysterectomy  1999  . Colonoscopy    . Breast surgery  2002    rt br bx  . Cholecystectomy  2005    lap choli  . Finger ganglion cyst excision    . Shoulder arthroscopy with open rotator cuff repair and distal clavicle acrominectomy Right 09/09/2012    Procedure: Right Shoulder Arthroscopy with distal clavicle excision and mini-open rotator cuff repair.;  Surgeon: Alta Corning, MD;  Location: [place];  Service: Orthopedics;  Laterality: Right;    There were no vitals filed for this visit.      Subjective Assessment - 10/19/15 0938    Subjective Reports that hip is much better and states that she walked a lot today and is surprised she doesn't have more pain.   Pertinent History Nerve injury in right ankle.  Has to wear a brace.   Limitations Standing;Walking   How long can you stand comfortably? 2 hours   How long can you walk comfortably? Short distances.   Currently in Pain? Yes   Pain Score 2    Pain Location Hip   Pain Orientation Left    Pain Descriptors / Indicators Sore            [facility] PT Assessment - 10/19/15 0001    Assessment   Medical Diagnosis Bil trochanteric bursitis.   Hand Dominance Right   Precautions   Precautions None   Required Braces or Orthoses Other Brace/Splint   Restrictions   Weight Bearing Restrictions No                     [facility] Adult PT Treatment/Exercise - 10/19/15 0001    Modalities   Modalities Electrical Stimulation;Moist Heat;Ultrasound   Moist Heat Therapy   Number Minutes Moist Heat 15 Minutes   Moist Heat Location Hip   Electrical Stimulation   Electrical Stimulation Location L hip   Electrical Stimulation Action Pre-Mod   Electrical Stimulation Parameters 80-150 hz x15 min   Electrical Stimulation Goals Pain   Ultrasound   Ultrasound Location L hip   Ultrasound Parameters 1.5 w/cm2, 100%, 1 mhz x10 min   Ultrasound Goals Pain   Manual Therapy   Manual Therapy Myofascial release   Myofascial Release MFR/STW to L Glute Max, Glute Medius, ITB to decrease pain and tightness  PT Short Term Goals - 10/19/15 1019    PT SHORT TERM GOAL #1   Title Ind with a HEP.   Time 3   Period Weeks   Status Achieved           PT Long Term Goals - 10/19/15 1020    PT LONG TERM GOAL #1   Title Sleep 6 hours undisturbed.   Time 8   Period Weeks   Status Achieved   PT LONG TERM GOAL #2   Title Stand 20 minutes with pain not > 3/10.   Time 8   Period Weeks   Status Achieved   PT LONG TERM GOAL #3   Title Reports a 50% subjective improvement rating when transitioning from sit to stand.   Time 8   Period Weeks   Status On-going   PT LONG TERM GOAL #4   Title Sit 30 minutes with pain not > 3/10.   Time 8   Period Weeks   Status Achieved   PT LONG TERM GOAL #5   Title Perform ADL's with pain not > 3/10.   Period Weeks   Status Achieved               Plan - 10/19/15 1022    Clinical Impression Statement Patient  tolerated today's treatment well with arrival at clinic with low level soreness. Patient has now achieved all goals at evaluation except for transition movements. Patient reports she is now able to lay on her L hip for a small time period. Patient's L hip and glute tightness was decreased from previous treatments and patient had no negerive complaints in regards to manual therapy. Normal modalities response noted following removal of the modaliites. Patient experienced hip feeling "better" following today's treatment. Patient reports stiffness with transitional movements upon standing as reasoning for not meeting transiitonal goal.   Rehab Potential Good   PT Frequency 2x / week   PT Duration 8 weeks   PT Treatment/Interventions ADLs/Self Care Home Management;Electrical Stimulation;Moist Heat;Ultrasound;Therapeutic activities;Therapeutic exercise;Patient/family education;Manual techniques;Traction   PT Next Visit Plan Continue modalites and manual therapy at this time as symptoms dictate and progress into therapeutic exercise. Continues to monitor for iontophoresis sign off.   PT Home Exercise Plan piriformis and ITB stretches   Consulted and Agree with Plan of Care Patient      Patient will benefit from skilled therapeutic intervention in order to improve the following deficits and impairments:  Pain, Decreased activity tolerance, Decreased range of motion  Visit Diagnosis: Pain in left hip  Pain in right hip     Problem List There are no active problems to display for this patient.   Wynelle Fanny, PTA 10/19/2015, 10:47 AM  [facility] [address] [place], [place], [address] Phone: [phone]   Fax:  [phone]  Name: KELLYJO TURNBAUGH MRN: [id number] Date of Birth: 17-Jan-1946

## 2015-10-24 ENCOUNTER — Ambulatory Visit: Payer: Medicare Other | Admitting: Physical Therapy

## 2015-10-24 DIAGNOSIS — M25552 Pain in left hip: Secondary | ICD-10-CM | POA: Diagnosis not present

## 2015-10-24 NOTE — Therapy (Signed)
[facility] [place], [place], [address] Phone: [phone]   Fax:  [phone]  Physical Therapy Treatment  Patient Details  Name: [name] MRN: [id number] Date of Birth: 1946/01/08 Referring Provider: Cy Blamer MD.  Encounter Date: 10/24/2015      PT End of Session - 10/24/15 2001    Visit Number 8   Number of Visits 16   Date for PT Re-Evaluation 11/19/15   PT Start Time 0230   PT Stop Time 0317   PT Time Calculation (min) 47 min   Activity Tolerance Patient tolerated treatment well   Behavior During Therapy [facility] for tasks assessed/performed      Past Medical History  Diagnosis Date  . Hypertension   . GERD (gastroesophageal reflux disease)   . Arthritis   . Sleep apnea     uses a cpap  . Hyperlipemia   . Wears glasses     Past Surgical History  Procedure Laterality Date  . Cervical fusion  2007  . Tonsillectomy    . Abdominal hysterectomy  1999  . Colonoscopy    . Breast surgery  2002    rt br bx  . Cholecystectomy  2005    lap choli  . Finger ganglion cyst excision    . Shoulder arthroscopy with open rotator cuff repair and distal clavicle acrominectomy Right 09/09/2012    Procedure: Right Shoulder Arthroscopy with distal clavicle excision and mini-open rotator cuff repair.;  Surgeon: Alta Corning, MD;  Location: [place];  Service: Orthopedics;  Laterality: Right;    There were no vitals filed for this visit.      Subjective Assessment - 10/24/15 2002    Subjective My hip is much better.  I can't sleep on my left side yet but I'm testing it out.   Pain Score 2    Pain Location Hip   Pain Orientation Left   Pain Descriptors / Indicators Sore   Pain Type Chronic pain   Pain Onset More than a month ago                         [facility] Adult PT Treatment/Exercise - 10/24/15 0001    Moist Heat Therapy   Number Minutes Moist Heat 15 Minutes   Moist  Heat Location --  Left hip.   Acupuncturist Location --  LT hip.   Electrical Stimulation Action Pre-mod   Electrical Stimulation Parameters 80-50 Hz x 15 minutes.   Electrical Stimulation Goals Pain   Ultrasound   Ultrasound Location LT hip   Ultrasound Parameters 1.50 W/CM2 x 12 minutes performed in right sdly position with pillows between knees for comfort.   Ultrasound Goals Pain   Manual Therapy   Myofascial Release STW/M to left greater trochanteric region x 11 minutes.                  PT Short Term Goals - 10/19/15 1019    PT SHORT TERM GOAL #1   Title Ind with a HEP.   Time 3   Period Weeks   Status Achieved           PT Long Term Goals - 10/19/15 1020    PT LONG TERM GOAL #1   Title Sleep 6 hours undisturbed.   Time 8   Period Weeks   Status Achieved   PT LONG TERM GOAL #2   Title Stand  20 minutes with pain not > 3/10.   Time 8   Period Weeks   Status Achieved   PT LONG TERM GOAL #3   Title Reports a 50% subjective improvement rating when transitioning from sit to stand.   Time 8   Period Weeks   Status On-going   PT LONG TERM GOAL #4   Title Sit 30 minutes with pain not > 3/10.   Time 8   Period Weeks   Status Achieved   PT LONG TERM GOAL #5   Title Perform ADL's with pain not > 3/10.   Period Weeks   Status Achieved             Patient will benefit from skilled therapeutic intervention in order to improve the following deficits and impairments:     Visit Diagnosis: Pain in left hip     Problem List There are no active problems to display for this patient.   APPLEGATE, [place] MPT 10/24/2015, 8:05 PM  [facility] [address] [place], [place], [address] Phone: [phone]   Fax:  [phone]  Name: [name] MRN: [id number] Date of Birth: 1945-06-06

## 2015-10-26 ENCOUNTER — Ambulatory Visit: Payer: Medicare Other | Admitting: *Deleted

## 2015-10-26 DIAGNOSIS — M25552 Pain in left hip: Secondary | ICD-10-CM | POA: Diagnosis not present

## 2015-10-26 DIAGNOSIS — M25551 Pain in right hip: Secondary | ICD-10-CM

## 2015-10-26 NOTE — Therapy (Signed)
[facility] [place], [place], [address] Phone: [phone]   Fax:  [phone]  Physical Therapy Treatment  Patient Details  Name: [name] MRN: [id number] Date of Birth: [birth year] Referring Provider: Cy Blamer MD.  Encounter Date: 10/26/2015      PT End of Session - 10/26/15 1705    Visit Number 9   Number of Visits 16   Date for PT Re-Evaluation 11/19/15   PT Start Time 1600   PT Stop Time 1649   PT Time Calculation (min) 49 min      Past Medical History  Diagnosis Date  . Hypertension   . GERD (gastroesophageal reflux disease)   . Arthritis   . Sleep apnea     uses a cpap  . Hyperlipemia   . Wears glasses     Past Surgical History  Procedure Laterality Date  . Cervical fusion  2007  . Tonsillectomy    . Abdominal hysterectomy  1999  . Colonoscopy    . Breast surgery  2002    rt br bx  . Cholecystectomy  2005    lap choli  . Finger ganglion cyst excision    . Shoulder arthroscopy with open rotator cuff repair and distal clavicle acrominectomy Right 09/09/2012    Procedure: Right Shoulder Arthroscopy with distal clavicle excision and mini-open rotator cuff repair.;  Surgeon: Alta Corning, MD;  Location: [place];  Service: Orthopedics;  Laterality: Right;    There were no vitals filed for this visit.      Subjective Assessment - 10/26/15 1643    Subjective My hip is much better.  I can't sleep on my left side yet but I'm testing it out. 70-75% better.    Pertinent History Nerve injury in right ankle.  Has to wear a brace.   Limitations Standing;Walking   How long can you stand comfortably? 2 hours   How long can you walk comfortably? Short distances.   Currently in Pain? Yes   Pain Score 2    Pain Location Hip   Pain Orientation Left   Pain Descriptors / Indicators Sore   Pain Type Chronic pain   Pain Onset More than a month ago                          [facility] Adult PT Treatment/Exercise - 10/26/15 0001    Modalities   Modalities Electrical Stimulation;Moist Heat;Ultrasound   Moist Heat Therapy   Number Minutes Moist Heat 15 Minutes   Moist Heat Location Hip   Electrical Stimulation   Electrical Stimulation Location L hip   Electrical Stimulation Action premod   Electrical Stimulation Parameters  80-1 15 mins50 hz    Electrical Stimulation Goals Pain   Ultrasound   Ultrasound Location LT hip   Ultrasound Parameters 1.5 w/cm2 x 10 mins in RT sidelying    Ultrasound Goals Pain   Manual Therapy   Manual Therapy Myofascial release   Myofascial Release STW/ IASTM to left greater trochanteric region x 14 minutes.                  PT Short Term Goals - 10/19/15 1019    PT SHORT TERM GOAL #1   Title Ind with a HEP.   Time 3   Period Weeks   Status Achieved           PT Long Term Goals - 10/19/15 1020  PT LONG TERM GOAL #1   Title Sleep 6 hours undisturbed.   Time 8   Period Weeks   Status Achieved   PT LONG TERM GOAL #2   Title Stand 20 minutes with pain not > 3/10.   Time 8   Period Weeks   Status Achieved   PT LONG TERM GOAL #3   Title Reports a 50% subjective improvement rating when transitioning from sit to stand.   Time 8   Period Weeks   Status On-going   PT LONG TERM GOAL #4   Title Sit 30 minutes with pain not > 3/10.   Time 8   Period Weeks   Status Achieved   PT LONG TERM GOAL #5   Title Perform ADL's with pain not > 3/10.   Period Weeks   Status Achieved               Plan - 10/26/15 1707    Clinical Impression Statement Pt did great today and was able to tolerate Rx well. She was still sore with STW, but feels 70-75% better. overall. Standing transitional movements are still the most challenging due to stiffness and goal is ongoing   Rehab Potential Good   PT Frequency 2x / week   PT Duration 8 weeks   PT Treatment/Interventions ADLs/Self  Care Home Management;Electrical Stimulation;Moist Heat;Ultrasound;Therapeutic activities;Therapeutic exercise;Patient/family education;Manual techniques;Traction   PT Home Exercise Plan piriformis and ITB stretches   Consulted and Agree with Plan of Care Patient      Patient will benefit from skilled therapeutic intervention in order to improve the following deficits and impairments:  Pain, Decreased activity tolerance, Decreased range of motion  Visit Diagnosis: Pain in left hip  Pain in right hip     Problem List There are no active problems to display for this patient.   RAMSEUR,CHRIS, PTA 10/26/2015, 5:19 PM  [facility] [place], [place], [address] Phone: [phone]   Fax:  [phone]  Name: [name] MRN: [id number] Date of Birth: March 29, 1946

## 2015-11-01 ENCOUNTER — Encounter: Payer: Medicare Other | Admitting: Physical Therapy

## 2015-11-02 ENCOUNTER — Encounter: Payer: Medicare Other | Admitting: Physical Therapy

## 2015-11-07 ENCOUNTER — Ambulatory Visit: Payer: Medicare Other | Attending: Rheumatology | Admitting: Physical Therapy

## 2015-11-07 ENCOUNTER — Encounter: Payer: Self-pay | Admitting: Physical Therapy

## 2015-11-07 DIAGNOSIS — M25551 Pain in right hip: Secondary | ICD-10-CM | POA: Diagnosis present

## 2015-11-07 DIAGNOSIS — M25552 Pain in left hip: Secondary | ICD-10-CM | POA: Diagnosis present

## 2015-11-07 NOTE — Therapy (Signed)
[facility] [place], [place], [address] Phone: [phone]   Fax:  [phone]  Physical Therapy Treatment  Patient Details  Name: [name] MRN: [id number] Date of Birth: 05/07/1945 Referring Provider: Cy Blamer MD.  Encounter Date: 11/07/2015      PT End of Session - 11/07/15 1032    Visit Number 10   Number of Visits 16   Date for PT Re-Evaluation 11/19/15   PT Start Time [id number]   PT Stop Time 1126   PT Time Calculation (min) 51 min   Activity Tolerance Patient tolerated treatment well   Behavior During Therapy [facility] for tasks assessed/performed      Past Medical History  Diagnosis Date  . Hypertension   . GERD (gastroesophageal reflux disease)   . Arthritis   . Sleep apnea     uses a cpap  . Hyperlipemia   . Wears glasses     Past Surgical History  Procedure Laterality Date  . Cervical fusion  2007  . Tonsillectomy    . Abdominal hysterectomy  1999  . Colonoscopy    . Breast surgery  2002    rt br bx  . Cholecystectomy  2005    lap choli  . Finger ganglion cyst excision    . Shoulder arthroscopy with open rotator cuff repair and distal clavicle acrominectomy Right 09/09/2012    Procedure: Right Shoulder Arthroscopy with distal clavicle excision and mini-open rotator cuff repair.;  Surgeon: Alta Corning, MD;  Location: [place];  Service: Orthopedics;  Laterality: Right;    There were no vitals filed for this visit.      Subjective Assessment - 11/07/15 1032    Subjective Reports that her hip is doing much better and has not noticed any pain today.   Pertinent History Nerve injury in right ankle.  Has to wear a brace.   Limitations Standing;Walking   How long can you stand comfortably? 2 hours   How long can you walk comfortably? Short distances.   Currently in Pain? No/denies            [facility] PT Assessment - 11/07/15 0001    Assessment   Medical Diagnosis Bil  trochanteric bursitis.   Hand Dominance Right   Precautions   Precautions None   Required Braces or Orthoses Other Brace/Splint   Restrictions   Weight Bearing Restrictions No                     [facility] Adult PT Treatment/Exercise - 11/07/15 0001    Modalities   Modalities Electrical Stimulation;Moist Heat;Ultrasound   Moist Heat Therapy   Number Minutes Moist Heat 15 Minutes   Moist Heat Location Hip   Electrical Stimulation   Electrical Stimulation Location L hip   Electrical Stimulation Action Pre-Mod   Electrical Stimulation Parameters 80-150 hz x15 min   Electrical Stimulation Goals Pain   Ultrasound   Ultrasound Location L hip   Ultrasound Parameters 1.5 w/cm2, 100%, 1 mhz x10 min   Ultrasound Goals Pain   Manual Therapy   Manual Therapy Myofascial release   Myofascial Release MFR/STW to L greater trochanteric region and into L Glute to decrease tightness in R sidelying                  PT Short Term Goals - 10/19/15 1019    PT SHORT TERM GOAL #1   Title Ind with a HEP.   Time 3  Period Weeks   Status Achieved           PT Long Term Goals - 11/07/15 1123    PT LONG TERM GOAL #1   Title Sleep 6 hours undisturbed.   Time 8   Period Weeks   Status Achieved   PT LONG TERM GOAL #2   Title Stand 20 minutes with pain not > 3/10.   Time 8   Period Weeks   Status Achieved   PT LONG TERM GOAL #3   Title Reports a 50% subjective improvement rating when transitioning from sit to stand.   Time 8   Period Weeks   Status Achieved   PT LONG TERM GOAL #4   Title Sit 30 minutes with pain not > 3/10.   Time 8   Period Weeks   Status Achieved   PT LONG TERM GOAL #5   Title Perform ADL's with pain not > 3/10.   Period Weeks   Status Achieved               Plan - 11/07/15 1120    Clinical Impression Statement Patient tolerated today's treatment well and arrived with no pain today. Patient presented with L glute tightness today but  no tenderness or soreness reported. Patient reports that now transitional movements are now much better with 50% or more improvement noted. Normal modalities response noted following end of today's treatment. All goals have been achieved at htis time per patient report. Patient experienced L hip feeling "good" following end of treatment per patient report.   Rehab Potential Good   PT Frequency 2x / week   PT Duration 8 weeks   PT Treatment/Interventions ADLs/Self Care Home Management;Electrical Stimulation;Moist Heat;Ultrasound;Therapeutic activities;Therapeutic exercise;Patient/family education;Manual techniques;Traction   PT Next Visit Plan Continue modalites and manual therapy at this time as symptoms dictate and progress into therapeutic exercise. Continues to monitor for iontophoresis sign off.   PT Home Exercise Plan piriformis and ITB stretches   Consulted and Agree with Plan of Care Patient      Patient will benefit from skilled therapeutic intervention in order to improve the following deficits and impairments:  Pain, Decreased activity tolerance, Decreased range of motion  Visit Diagnosis: Pain in left hip  Pain in right hip     Problem List There are no active problems to display for this patient.  Ahmed Prima, PTA 11/07/2015 11:30 AM  [facility] [address] [place], [place], [address] Phone: [phone]   Fax:  [phone]  Name: [name] MRN: [id number] Date of Birth: 11/10/1945

## 2015-11-09 ENCOUNTER — Ambulatory Visit: Payer: Medicare Other | Admitting: Physical Therapy

## 2015-11-09 ENCOUNTER — Encounter: Payer: Self-pay | Admitting: Physical Therapy

## 2015-11-09 DIAGNOSIS — M25552 Pain in left hip: Secondary | ICD-10-CM | POA: Diagnosis not present

## 2015-11-09 DIAGNOSIS — M25551 Pain in right hip: Secondary | ICD-10-CM

## 2015-11-09 NOTE — Therapy (Addendum)
[facility] [place], [place], [address] Phone: [phone]   Fax:  [phone]  Physical Therapy Treatment  Patient Details  Name: [name] MRN: [id number] Date of Birth: [birth year] Referring Provider: Cy Blamer MD.  Encounter Date: 11/09/2015      PT End of Session - 11/09/15 1034    Visit Number 11   Number of Visits 16   Date for PT Re-Evaluation 11/19/15   PT Start Time 1034   PT Stop Time 1121   PT Time Calculation (min) 47 min   Activity Tolerance Patient tolerated treatment well   Behavior During Therapy [facility] for tasks assessed/performed      Past Medical History  Diagnosis Date  . Hypertension   . GERD (gastroesophageal reflux disease)   . Arthritis   . Sleep apnea     uses a cpap  . Hyperlipemia   . Wears glasses     Past Surgical History  Procedure Laterality Date  . Cervical fusion  2007  . Tonsillectomy    . Abdominal hysterectomy  1999  . Colonoscopy    . Breast surgery  2002    rt br bx  . Cholecystectomy  2005    lap choli  . Finger ganglion cyst excision    . Shoulder arthroscopy with open rotator cuff repair and distal clavicle acrominectomy Right 09/09/2012    Procedure: Right Shoulder Arthroscopy with distal clavicle excision and mini-open rotator cuff repair.;  Surgeon: Alta Corning, MD;  Location: [facility];  Service: Orthopedics;  Laterality: Right;    There were no vitals filed for this visit.      Subjective Assessment - 11/09/15 1034    Subjective (p) Reports hip doing good with no pain today.   Pertinent History Nerve injury in right ankle.  Has to wear a brace.   Limitations Standing;Walking   How long can you stand comfortably? 2 hours   How long can you walk comfortably? Short distances.   Currently in Pain? (p) No/denies            [facility] PT Assessment - 11/09/15 0001    Assessment   Medical Diagnosis Bil trochanteric bursitis.   Hand Dominance Right   Precautions   Precautions None   Required Braces or Orthoses Other Brace/Splint   Restrictions   Weight Bearing Restrictions No                     [facility] Adult PT Treatment/Exercise - 11/09/15 0001    Modalities   Modalities Electrical Stimulation;Moist Heat;Ultrasound   Moist Heat Therapy   Number Minutes Moist Heat 15 Minutes   Moist Heat Location Hip   Electrical Stimulation   Electrical Stimulation Location L hip   Electrical Stimulation Action Pre-Mod   Electrical Stimulation Parameters 80-150 hz x15 min   Electrical Stimulation Goals Pain   Ultrasound   Ultrasound Location L hip   Ultrasound Parameters 1.5 w/cm2, 100%,1 mhz x10 min   Ultrasound Goals Pain   Manual Therapy   Manual Therapy Myofascial release   Myofascial Release IASTW/MFR to L greater trochanteric region and glute to decrease tightness                  PT Short Term Goals - 10/19/15 1019    PT SHORT TERM GOAL #1   Title Ind with a HEP.   Time 3   Period Weeks   Status Achieved  PT Long Term Goals - 11/07/15 1123    PT LONG TERM GOAL #1   Title Sleep 6 hours undisturbed.   Time 8   Period Weeks   Status Achieved   PT LONG TERM GOAL #2   Title Stand 20 minutes with pain not > 3/10.   Time 8   Period Weeks   Status Achieved   PT LONG TERM GOAL #3   Title Reports a 50% subjective improvement rating when transitioning from sit to stand.   Time 8   Period Weeks   Status Achieved   PT LONG TERM GOAL #4   Title Sit 30 minutes with pain not > 3/10.   Time 8   Period Weeks   Status Achieved   PT LONG TERM GOAL #5   Title Perform ADL's with pain not > 3/10.   Period Weeks   Status Achieved               Plan - 11/09/15 1158    Clinical Impression Statement Patient continues to arrive to treatment reporting no pain in the L hip at present. Patient continues to present with increased L lateral glute tightness but releases with  manual therapy. Normal modalities response noted following removal of the modalities. All goals have been achieved at this time and patient wishes to be placed on hold secondary to no L hip pain and upcoming vacation.    Rehab Potential Good   PT Frequency 2x / week   PT Duration 8 weeks   PT Treatment/Interventions ADLs/Self Care Home Management;Electrical Stimulation;Moist Heat;Ultrasound;Therapeutic activities;Therapeutic exercise;Patient/family education;Manual techniques;Traction   PT Next Visit Plan Place chart on hold. Patient to contact clinic in 2 weeks after she returns from vacation.   PT Home Exercise Plan piriformis and ITB stretches   Consulted and Agree with Plan of Care Patient      Patient will benefit from skilled therapeutic intervention in order to improve the following deficits and impairments:  Pain, Decreased activity tolerance, Decreased range of motion  Visit Diagnosis: Pain in left hip  Pain in right hip     Problem List There are no active problems to display for this patient.   Wynelle Fanny, PTA 11/09/2015, 12:01 PM Mali Applegate MPT [facility] [address] [place], [place], [address] Phone: [phone]   Fax:  [phone]  Name: [name] MRN: [id number] Date of Birth: March 05, 1946   PHYSICAL THERAPY DISCHARGE SUMMARY  Visits from Start of Care: 11.  Current functional level related to goals / functional outcomes: See above.   Remaining deficits: Goals met.   Education / Equipment: HEP. Plan: Patient agrees to discharge.  Patient goals were met. Patient is being discharged due to meeting the stated rehab goals.  ?????        Mali Applegate MPT

## 2016-01-15 ENCOUNTER — Other Ambulatory Visit (HOSPITAL_COMMUNITY)
Admission: RE | Admit: 2016-01-15 | Discharge: 2016-01-15 | Disposition: A | Discharge status: Home / Self Care | Payer: Medicare Other | Source: Ambulatory Visit | Attending: Ophthalmology | Admitting: Ophthalmology

## 2016-01-15 DIAGNOSIS — Z029 Encounter for administrative examinations, unspecified: Secondary | ICD-10-CM | POA: Diagnosis present

## 2016-01-16 LAB — MISC LABCORP TEST (SEND OUT): Labcorp test code: 86007

## 2016-01-18 LAB — ACETYLCHOLINE RECEPTOR AB, ALL
ACETYLCHOLINE BINDING AB: 6.16 nmol/L — AB (ref 0.00–0.24)
ACETYLCHOLINE MODULAT AB: 48 % — AB (ref 0–20)
Acetylchol Block Ab: 31 % — ABNORMAL HIGH (ref 0–25)

## 2016-03-07 ENCOUNTER — Other Ambulatory Visit: Payer: Self-pay

## 2016-03-07 ENCOUNTER — Ambulatory Visit (INDEPENDENT_AMBULATORY_CARE_PROVIDER_SITE_OTHER): Payer: Medicare Other | Admitting: Neurology

## 2016-03-07 ENCOUNTER — Encounter: Payer: Self-pay | Admitting: Neurology

## 2016-03-07 VITALS — BP 103/39 | HR 74 | Ht 64.0 in | Wt 216.6 lb

## 2016-03-07 DIAGNOSIS — G7000 Myasthenia gravis without (acute) exacerbation: Secondary | ICD-10-CM | POA: Diagnosis not present

## 2016-03-07 MED ORDER — PYRIDOSTIGMINE BROMIDE 60 MG PO TABS: 30.0000 mg | ORAL_TABLET | Freq: Three times a day (TID) | ORAL | 3 refills | Status: DC

## 2016-03-07 NOTE — Patient Instructions (Addendum)
   Myasthenia Gravis Myasthenia gravis (MG) means severe weakness. It is a long-term (chronic) condition that causes weakness in the muscles you can control (voluntary muscles). MG can affect any voluntary muscle. The muscles most often affected are the ones that control:   Eye movement.  Facial movements.  Swallowing. MG is an autoimmune disease, which means that your body's defense system (immune system) attacks healthy parts of your body instead of germs and other things that make you sick. When you have MG, your immune system makes proteins (antibodies) that block the chemical (acetylcholine) your body needs to send nerve signals to your muscles. This causes muscle weakness. CAUSES  The exact cause of MG is unknown. One possible cause is an enlarged thymus gland, which is located under your breastbone.  SIGNS AND SYMPTOMS The earliest symptom of MG is muscle weakness that gets worse with activity and gets better after rest. Other symptoms of MG may include:  Drooping eyelids.  Double vision.  Loss of facial expression.  Trouble chewing and swallowing.  Slurred speech.  A waddling walk.  Weakness of the arms, hands, and legs. Trouble breathing is the most dangerous symptom of MG. Sudden and severe difficulty breathing (myasthenic crisis) may require emergency breathing support. This symptom sometimes happens after:   Infection.  Fever.  Drug reaction. DIAGNOSIS  It can be hard to diagnose MG because muscle weakness is a common symptom in many conditions. Your health care provider will do a physical exam. You may also have tests that will help make a diagnosis. These may include:  A blood test.  A test using the medicine edrophonium. This medicine increases muscle strength by slowing the breakdown of acetylcholine.  Tests to measure nerve conduction to muscle (electromyography).  An imaging study of the chest (CT or MRI). TREATMENT  Treatment can improve muscle  strength. Sometimes symptoms of MG go away for a while (remission) and you can stop treatment. Possible treatments include:  Medicine.  Removal of the thymus gland (thymectomy). This may result in a long remission for some people. HOME CARE INSTRUCTIONS  Take medicines only as directed by your health care provider.  Get plenty of rest to conserve your energy.  Take frequent breaks to rest your eyes.  Maintain a healthy diet and a healthy weight.  Do not use any tobacco products including cigarettes, chewing tobacco, or electronic cigarettes. If you need help quitting, ask your health care provider.  Keep all follow-up visits as directed by your health care provider. This is important. SEEK MEDICAL CARE IF:  Your symptoms get worse after a fever or infection.  You have a reaction to a medicine you are taking.  Your symptoms change or get worse. SEEK IMMEDIATE MEDICAL CARE IF: You have trouble breathing.    This information is not intended to replace advice given to you by your health care provider. Make sure you discuss any questions you have with your health care provider.   Document Released: 07/22/2000 Document Revised: 05/06/2014 Document Reviewed: 06/16/2013 Elsevier Interactive Patient Education [employer].

## 2016-03-07 NOTE — Progress Notes (Signed)
[facility] [address] [place]. [place] [address] [phone]       OFFICE CONSULT NOTE  Crystal. [name] Date of Birth:  [birth year] Medical Record Number:  [id number]   Referring MD:   Shelbie Proctor  Reason for Referral: myasthenia  HPI: [name] is a 88 year pleasant Caucasian lady whose had droopy eyelids first started in December 2016 when she stated states that "I was drooping and she will barely open it. She was seen by primary physician who thought she had partial Bell's palsy. This did not get better and subsequently in June she saw ophthalmologist who noticed that she had some infection on the lower eyelid treated with antibiotics. Since then she started that she has intermittent opening of both eyelids left more than right. There are times when arises is completely shut and on other times she can see well. In fact on inquiry she states that a couple of occasion while driving she was seeing double the dividing lines of the road for crossing over. She is also noticed that she gets fatigued and tired very easily. Initially when this first began this was thought to be related to viral infection but this does not seem to be getting better. She denies any difficulty moving her eyes, any dysphagia, weakness in her arms or legs. She has no trouble getting out, chair or climbing up and down steps. She has a good appetite and she had not lost any weight. Review of electronic medical record shows that she had elevated acetylcholine receptor antibodies on 01/15/16 with acetylcholine binding antibody of 6.16, blocking antibody 31 and modulating antibody 48 all of which are abnormal. Patient has not been tried on Mestinon on other medications for myasthenia. She denies any history of headaches, strokes, TIAs or other neurologic problems.  ROS:   14 system review of systems is positive for  blurred vision, easy bruising, fatigue, shortness of breath, droopy eyelids and all  other systems negative  PMH:  Past Medical History:  Diagnosis Date  . Arthritis   . Bursitis   . Diabetes mellitus without complication ([place])   . Fibromyalgia   . GERD (gastroesophageal reflux disease)   . Hyperlipemia   . Hypertension   . Sinusitis   . Sleep apnea    uses a cpap  . Sleep apnea   . Wears glasses     Social History:  Social History   Social History  . Marital status: Married    Spouse name: N/A  . Number of children: N/A  . Years of education: N/A   Occupational History  . Not on file.   Social History Main Topics  . Smoking status: Never Smoker  . Smokeless tobacco: Never Used  . Alcohol use No  . Drug use: No  . Sexual activity: Not on file   Other Topics Concern  . Not on file   Social History Narrative  . No narrative on file    Medications:   Current Outpatient Prescriptions on File Prior to Visit  Medication Sig Dispense Refill  . losartan-hydrochlorothiazide (HYZAAR) 100-25 MG per tablet Take 1 tablet by mouth daily.    . metFORMIN (GLUCOPHAGE) 500 MG tablet Take 500 mg by mouth 2 (two) times daily with a meal.    . omega-3 acid ethyl esters (LOVAZA) 1 G capsule Take 2 g by mouth daily.    . pregabalin (LYRICA) 50 MG capsule Take 50 mg by mouth 2 (two) times daily.     Marland Kitchen  rosuvastatin (CRESTOR) 10 MG tablet Take 10 mg by mouth daily. Takes 1/2     No current facility-administered medications on file prior to visit.     Allergies:   Allergies  Allergen Reactions  . Celebrex [Celecoxib] Swelling    hives  . Vioxx [Rofecoxib] Hives    Physical Exam General: well developed, well nourished middle-aged obese Caucasian lady, seated, in no evident distress Head: head normocephalic and atraumatic.   Neck: supple with no carotid or supraclavicular bruits Cardiovascular: regular rate and rhythm, no murmurs Musculoskeletal: no deformity Skin:  no rash/petichiae Vascular:  Normal pulses all extremities  Neurologic Exam Mental  Status: Awake and fully alert. Oriented to place and time. Recent and remote memory intact. Attention span, concentration and fund of knowledge appropriate. Mood and affect appropriate.  Cranial Nerves: Fundoscopic exam reveals sharp disc margins. Pupils equal, briskly reactive to light. Extraocular movements full without nystagmus. She has mild resting ptosis on the left but with sustained upgaze she has bilateral ptosis left greater than right but no subjective diplopia. Visual fields full to confrontation. Hearing intact. Facial sensation intact. Face, tongue, palate moves normally and symmetrically.  Motor: Normal bulk and tone. Normal strength in all tested extremity muscles. Sensory.: intact to touch , pinprick , position and vibratory sensation.  Coordination: Rapid alternating movements normal in all extremities. Finger-to-nose and heel-to-shin performed accurately bilaterally. Gait and Station: Arises from chair without difficulty. Stance is normal. Gait demonstrates normal stride length and balance . Able to heel, toe and tandem walk without difficulty.  Reflexes: 1+ and symmetric. Toes downgoing.       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 I recommend trial of Mestinon 30 mg 3 times daily for a week then increase to 4 times daily and then 2 weeks later to 60 mg 4 times daily. I discussed possible side effects with the patient and advised her to call me if needed. Check nerve conduction study with rapid repetitive stimulation as well as CT scan of the chest for thymoma. She was cautioned to call and discuss usage of any antibiotics or anesthesia prior  to any procedures. Greater than 50% time during this 45 minute consultation visit was spent on counseling and coordination of care about ocular myasthenia and treatment She will return for follow-up in 2 months or call earlier if necessary. Antony Contras, MD  [facility] [address] [place], [place] [address]  Phone [phone] Fax [phone] Note: This document was prepared with digital dictation and possible smart phrase technology. Any transcriptional errors that result from this process are unintentional.

## 2016-03-20 ENCOUNTER — Telehealth: Payer: Self-pay | Admitting: Neurology

## 2016-03-20 NOTE — Telephone Encounter (Signed)
Kindly ask her to reduce mestinon dose to 30 mg three times daily

## 2016-03-20 NOTE — Telephone Encounter (Signed)
Message sent to DR. Sethi for advice on mestinon.

## 2016-03-20 NOTE — Telephone Encounter (Signed)
Rn call patient back about the mestinon side effects causing diarrhea. Pt was taking the next increase dosage 30mg  to four times a day. Pt stated when she started taking it 4 times a day she had diarrhea 3 days in a row. Rn stated per Dr. Clydene Fake note he wants her to decrease it to 30mg  three times a day. Pt verbalized understanding. Rn requested pt to call back if her MG gets worse or have symptoms. Pt verbalized understanding.

## 2016-03-20 NOTE — Telephone Encounter (Signed)
Pt called says she's had diarrhea for the past 3 days since increasing  pyridostigmine (MESTINON) 60 MG tablet to 1/2 tab x 4/day. Please call

## 2016-04-10 ENCOUNTER — Ambulatory Visit
Admission: RE | Admit: 2016-04-10 | Discharge: 2016-04-10 | Disposition: A | Discharge status: Home / Self Care | Payer: Medicare Other | Source: Ambulatory Visit | Attending: Neurology | Admitting: Neurology

## 2016-04-10 ENCOUNTER — Ambulatory Visit (INDEPENDENT_AMBULATORY_CARE_PROVIDER_SITE_OTHER): Payer: Medicare Other | Admitting: Neurology

## 2016-04-10 ENCOUNTER — Ambulatory Visit (INDEPENDENT_AMBULATORY_CARE_PROVIDER_SITE_OTHER): Payer: Self-pay | Admitting: Neurology

## 2016-04-10 DIAGNOSIS — G7000 Myasthenia gravis without (acute) exacerbation: Secondary | ICD-10-CM

## 2016-04-10 DIAGNOSIS — Z0289 Encounter for other administrative examinations: Secondary | ICD-10-CM

## 2016-04-10 NOTE — Progress Notes (Signed)
   STUDY DATE: 04/10/16 PATIENT NAME: [name] DOB: 18-Jan-1946 MRN: [id number]  REFERRED BY: Antony Contras, MD  HISTORY:  She is a 70 year old woman with ptosis and diplopia undergoing evaluation for myasthenia gravis. She also noted mild shoulder weakness. She was started on Mestinon after being found to have elevated acetylcholine receptor antibodies and feels that her symptoms are better.  NERVE CONDUCTION STUDIES:  Repetitive stimulation of the ulnar nerve did not show a decremental or incremental response at baseline or 30 seconds, 60 seconds, 120 seconds, 180 seconds or 240 seconds after exercise.  Repetitive stimulation of the ulnar nerve did not show a decremental or incremental response at baseline or 30 seconds, 60 seconds, 120 seconds, 180 seconds or 240 seconds after exercise.   EMG STUDIES:  Needle EMG of selected muscles of the left arm was performed.    Deltoid: There was no spontaneous activity. Motor unit morphology and recruitment was normal. Triceps: There was no spontaneous activity. She had an increased number of large polyphasic units with a neuropathic recruitment. Biceps:There was no spontaneous activity. Motor unit morphology and recruitment was normal. Extensor digitorum communis: There was no spontaneous activity. She had an increased number of large polyphasic units with a neuropathic recruitment. First dorsal interosseous:   There was no spontaneous activity. Motor unit morphology and recruitment was normal.   IMPRESSION:  This nerve conduction and EMG study shows the following: 1.    Repetitive stimulation of the ulnar and the spinal accessory nerves are normal before and after exercise.   Of note, the patient is currently on pyridostigmine which would affect the sensitivity. 2.    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   Shawnelle Spoerl A. Felecia Shelling, MD, PhD Certified in Neurology, [place], Sleep Medicine, Pain Medicine and  Neuroimaging  [facility] [address] [place], [place] [address] [phone]

## 2016-04-15 ENCOUNTER — Telehealth: Payer: Self-pay

## 2016-04-15 NOTE — Telephone Encounter (Signed)
Rn call patient about her Ct scan of the chest Kindly inform  does not show any thymoma or tumor. Advise patient to see primary care physician to discuss mild abnormalities of airway disease Pt verbalized understanding. Rn stated a copy will be sent to the PCP.

## 2016-04-15 NOTE — Telephone Encounter (Signed)
-----   Message from Garvin Fila, MD sent at 04/14/2016  3:36 PM EST ----- Kindly inform  the patient that CT scan of the chest does not show any thymoma or tumor. Advise patient to see primary care physician to discuss mild abnormalities of airway disease

## 2016-04-16 NOTE — Telephone Encounter (Signed)
Copy of last office notes, and ct scan sent to patients PCP via fax.

## 2016-05-01 DIAGNOSIS — M19041 Primary osteoarthritis, right hand: Secondary | ICD-10-CM | POA: Insufficient documentation

## 2016-05-01 DIAGNOSIS — Z8669 Personal history of other diseases of the nervous system and sense organs: Secondary | ICD-10-CM | POA: Insufficient documentation

## 2016-05-01 DIAGNOSIS — M170 Bilateral primary osteoarthritis of knee: Secondary | ICD-10-CM | POA: Insufficient documentation

## 2016-05-01 DIAGNOSIS — M19042 Primary osteoarthritis, left hand: Secondary | ICD-10-CM

## 2016-05-01 DIAGNOSIS — M7062 Trochanteric bursitis, left hip: Secondary | ICD-10-CM

## 2016-05-01 DIAGNOSIS — E785 Hyperlipidemia, unspecified: Secondary | ICD-10-CM | POA: Insufficient documentation

## 2016-05-01 DIAGNOSIS — Z8679 Personal history of other diseases of the circulatory system: Secondary | ICD-10-CM | POA: Insufficient documentation

## 2016-05-01 DIAGNOSIS — M5136 Other intervertebral disc degeneration, lumbar region: Secondary | ICD-10-CM | POA: Insufficient documentation

## 2016-05-01 DIAGNOSIS — M4156 Other secondary scoliosis, lumbar region: Secondary | ICD-10-CM | POA: Insufficient documentation

## 2016-05-01 DIAGNOSIS — M47812 Spondylosis without myelopathy or radiculopathy, cervical region: Secondary | ICD-10-CM | POA: Insufficient documentation

## 2016-05-01 DIAGNOSIS — M7061 Trochanteric bursitis, right hip: Secondary | ICD-10-CM | POA: Insufficient documentation

## 2016-05-01 NOTE — Progress Notes (Deleted)
Office Visit Note  Patient: [name]             Date of Birth: [birth year]           MRN: [id number]             PCP: Sherrie Mustache, MD Referring: Dione Housekeeper, MD Visit Date: 05/02/2016 Occupation: '@GUAROCC' @    Subjective:  Left knee pain   History of Present Illness: [name] is a 71 y.o. female with history of osteoarthritis. She states she continues to have some discomfort in her bilateral feet and ankles. Hands are stiff but doing better with exercises. She has fallen twice since Thanksgiving. She is having discomfort in her bilateral knee joints more so in the left than the right knee. Her hands swell at times. She does have some lower back pain especially at night. She states her balance is not good. She went for physical therapy which helped her with trochanteric bursitis. 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   Activities of Daily Living:  Patient reports morning stiffness for1  hour.   Patient Reports nocturnal pain.  Difficulty dressing/grooming: Denies Difficulty climbing stairs: Reports Difficulty getting out of chair: Reports Difficulty using hands for taps, buttons, cutlery, and/or writing: Reports   Review of Systems  Constitutional: Positive for fatigue and weight gain. Negative for night sweats, weight loss and weakness.  HENT: Positive for mouth dryness. Negative for mouth sores, trouble swallowing, trouble swallowing and nose dryness.   Eyes: Positive for dryness. Negative for pain, redness and visual disturbance.  Respiratory: Positive for shortness of breath. Negative for cough and difficulty breathing.   Cardiovascular: Negative for chest pain, palpitations, hypertension, irregular heartbeat and swelling in legs/feet.  Gastrointestinal: Negative for blood in stool, constipation and diarrhea.  Endocrine: Negative for increased urination.  Genitourinary: Negative for vaginal dryness.  Musculoskeletal:  Positive for arthralgias, joint pain, myalgias, morning stiffness and myalgias. Negative for joint swelling, muscle weakness and muscle tenderness.  Skin: Negative for color change, rash, hair loss, skin tightness, ulcers and sensitivity to sunlight.  Allergic/Immunologic: Negative for susceptible to infections.  Neurological: Negative for dizziness, memory loss and night sweats.  Hematological: Negative for swollen glands.  Psychiatric/Behavioral: Positive for sleep disturbance. Negative for depressed mood. The patient is nervous/anxious.     PMFS History:  Patient Active Problem List   Diagnosis Date Noted  . Primary osteoarthritis of both hands 05/01/2016  . Primary osteoarthritis of both knees 05/01/2016  . Spondylosis of lumbar region without myelopathy or radiculopathy 05/01/2016  . Trochanteric bursitis of both hips 05/01/2016  . 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 History of sleep apnea 05/01/2016  . History of hypertension 05/01/2016  . 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 Dyslipidemia 05/01/2016  . 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 Arthritis   . Bursitis   . Diabetes mellitus without complication ([place])   . Fibromyalgia   . GERD (gastroesophageal reflux disease)   . Hyperlipemia   . Hypertension   . Sinusitis   . Sleep apnea    uses a cpap  . Sleep apnea   . Wears glasses     Family History  Problem Relation Age of Onset  . Intracerebral hemorrhage Sister    Past Surgical History:  Procedure Laterality Date  . ABDOMINAL HYSTERECTOMY  1999  . BREAST SURGERY  2002   rt br bx  . CERVICAL FUSION  2007  . CHOLECYSTECTOMY  2005   lap choli  . COLONOSCOPY    . FINGER GANGLION CYST EXCISION    . SHOULDER ARTHROSCOPY WITH OPEN ROTATOR CUFF REPAIR AND DISTAL CLAVICLE ACROMINECTOMY Right 09/09/2012   Procedure: Right Shoulder Arthroscopy with distal clavicle excision and mini-open rotator cuff repair.;  Surgeon:  Alta Corning, MD;  Location: [facility];  Service: Orthopedics;  Laterality: Right;  . TONSILLECTOMY     Social History   Social History Narrative  . No narrative on file     Objective: Vital Signs: BP 122/64   Pulse 72   Resp 14   Ht '5\' 4"'  (1.626 m)   Wt 255 lb (115.7 kg)   BMI 43.77 kg/m    Physical Exam  Constitutional: She is oriented to person, place, and time. She appears well-developed and well-nourished.  HENT:  Head: Normocephalic and atraumatic.  Eyes: Conjunctivae and EOM are normal.  Neck: Normal range of motion.  Cardiovascular: Normal rate, regular rhythm, normal heart sounds and intact distal pulses.   Pulmonary/Chest: Effort normal and breath sounds normal.  Abdominal: Soft. Bowel sounds are normal.  Lymphadenopathy:    She has no cervical adenopathy.  Neurological: She is alert and oriented to person, place, and time.  Skin: Skin is warm and dry. Capillary refill takes less than 2 seconds.  Psychiatric: She has a normal mood and affect. Her behavior is normal.  Nursing note and vitals reviewed.    Musculoskeletal Exam: C-spine and thoracic lumbar spine limited range of motion with some discomfort. Shoulder joints elbow joints wrist joints with good range of motion. She is thickening of PIP/DIP joints bilaterally in her hands consistent with osteoarthritis no synovitis was noted. Hip joints knee joints ankles MTPs Is a good range of motion she has some discomfort with range of motion of her left knee joint. No warmth or effusion was noted. She has right ankle brace from a prior injury.  CDAI Exam: No CDAI exam completed.    Investigation: Findings:   MRI report of her C-spine from May 2014, which showed multilevel spondylosis and C6-7 fusion. 04/25/2014  X-ray of bilateral hands, 2 views, today, showed bilateral CMC narrowing, all DIP narrowing, erosive changes in bilateral 2nd DIP joints, more prominent in the right than in the left and  2nd DIP joints also showed significant narrowing and erosive changes.  The 5th DIPs were also narrowed.  Bilateral knee joint x-rays show right knee joint within normal limits, left showed moderate medial compartment narrowing without chondrocalcinosis.  She has moderate patellofemoral narrowing.  These findings were consistent with moderate osteoarthritis and moderate chondromalacia patella.  Lumbar spine x-rays were also obtained, which showed severe scoliosis, multilevel spondylosis with L2-3, 3-4, 4-5 narrowing.  No SI joint changes were noted.    06/08/2014 After informed consent was obtained, an ultrasound examination of the bilateral hands, the bilateral 2nd MCP joints and the bilateral 2nd, 3rd and 5th digit PIP joints and the bilateral wrist joints, both dorsal and volar aspects, were evaluated to look for any underlying synovitis or tenosynovitis.  The findings were:  She had narrowing of all PIP joints bilaterally and also severe in the DIP joints bilaterally.   There was no synovitis or tenosynovitis noted.  The wrist joint was also negative for any synovitis or tenosynovitis.  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  She did not have any symptoms of carpal tunnel syndrome today.     IMPRESSION AND PLAN:  These findings did not show any inflammatory arthritis.  These findings were consistent with osteoarthritis.    December 2015:  CBC was normal.  Comprehensive metabolic panel showed ALT of 49, glucose 141, sed rate 1.  Hepatitis panel was negative.  IgG was low at 670 which is borderline low.  HLA-B27 was negative.  Hepatitis panel, TB Gold, and SPEP were negative.  Uric acid was 4.2.    Imaging: Ct Chest High Resolution  Result Date: 04/11/2016 CLINICAL DATA:  Ocular myasthenia.  Cough.  Evaluate for thymoma. EXAM: CT CHEST WITHOUT CONTRAST  TECHNIQUE: Multidetector CT imaging of the chest was performed following the standard protocol without intravenous contrast. High resolution imaging of the lungs, as well as inspiratory and expiratory imaging, was performed. COMPARISON:  None. FINDINGS: Cardiovascular: Normal heart size. No significant pericardial fluid/thickening. Left anterior descending, left circumflex and right coronary atherosclerosis. Atherosclerotic nonaneurysmal thoracic aorta. Normal caliber pulmonary arteries. Mediastinum/Nodes: No discrete thyroid nodules. Unremarkable esophagus. No pathologically enlarged axillary, mediastinal or gross hilar lymph nodes, noting limited sensitivity for the detection of hilar adenopathy on this noncontrast study. No anterior mediastinal mass. Lungs/Pleura: No pneumothorax. No pleural effusion. No acute consolidative airspace disease, lung masses or significant pulmonary nodules. There is mild patchy subpleural reticulation and ground-glass attenuation in both lungs with a basilar predominance. There is associated mild bronchiectasis and bronchiolectasis at the lung bases. No frank honeycombing. No significant air trapping on the expiration sequence. Upper abdomen: Cholecystectomy. Musculoskeletal: No aggressive appearing focal osseous lesions. Moderate thoracic spondylosis. IMPRESSION: 1. No anterior mediastinal mass. 2. Mild basilar predominant patchy subpleural reticulation, ground-glass attenuation and associated bronchiectasis/bronchiolectasis. This spectrum of findings may indicate an interstitial lung disease, with the differential including fibrotic nonspecific interstitial pneumonia (NSIP) or usual interstitial pneumonia (UIP). A follow-up high-resolution chest CT is recommended in 12 months to assess temporal pattern stability. 3. Aortic atherosclerosis.  Three-vessel coronary atherosclerosis. Electronically Signed   By: Ilona Sorrel M.D.   On: 04/11/2016 12:19    Speciality Comments: No  specialty comments available.    Procedures:  No procedures performed Allergies: Celebrex [celecoxib] and Vioxx [rofecoxib]   Assessment / Plan:     Visit Diagnoses: Acute pain of left knee -patient had 2 falls since Thanksgiving. She states her left knee joint continues to hurt. No warmth swelling or effusion was noted. Plan: XR KNEE 3 VIEW LEFT  Primary osteoarthritis of both hands: She continues to have some pain and stiffness. There is no synovitis on examination. She's using ring splints which is been helpful.  Primary osteoarthritis of both knees: She has known history of osteoarthritis of bilateral knee joints which causes discomfort. Weight loss diet and exercise was discussed.  DJD C-spine - Status post fusion: She has limited range of motion and some discomfort.  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 She has no clinical features of autoimmune disease on examination. She'll be seeing a pulmonologist for further evaluation. In the meantime I'll obtain some labs. Which will include CK ANA ENA and ANCA rheumatoid factor and CCP. I will refer her to pulmonary for evaluation.  Scoliosis  Trochanteric bursitis of both hips: Has improved after physical therapy.  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 XR KNEE 3 VIEW LEFT  . CK  . Rheumatoid factor  . Sedimentation rate  . Cyclic citrul peptide antibody, IgG  . Angiotensin converting enzyme  . ENA 9 Panel  . ANA  . Pan-ANCA   No orders of the defined types were placed in this encounter.   Face-to-face time spent with patient was 35  minutes. 50% of time was spent in counseling and coordination of care.  Follow-Up Instructions: Return in about 6 months (around 10/30/2016) for Osteoarthritis.   Bo Merino, MD

## 2016-05-02 ENCOUNTER — Ambulatory Visit (INDEPENDENT_AMBULATORY_CARE_PROVIDER_SITE_OTHER): Payer: Medicare Other

## 2016-05-02 ENCOUNTER — Ambulatory Visit (INDEPENDENT_AMBULATORY_CARE_PROVIDER_SITE_OTHER): Payer: Medicare Other | Admitting: Rheumatology

## 2016-05-02 ENCOUNTER — Encounter: Payer: Self-pay | Admitting: Rheumatology

## 2016-05-02 VITALS — BP 122/64 | HR 72 | Resp 14 | Ht 64.0 in | Wt 255.0 lb

## 2016-05-02 DIAGNOSIS — M5030 Other cervical disc degeneration, unspecified cervical region: Secondary | ICD-10-CM

## 2016-05-02 DIAGNOSIS — M170 Bilateral primary osteoarthritis of knee: Secondary | ICD-10-CM

## 2016-05-02 DIAGNOSIS — M47816 Spondylosis without myelopathy or radiculopathy, lumbar region: Secondary | ICD-10-CM

## 2016-05-02 DIAGNOSIS — Z8669 Personal history of other diseases of the nervous system and sense organs: Secondary | ICD-10-CM

## 2016-05-02 DIAGNOSIS — G7000 Myasthenia gravis without (acute) exacerbation: Secondary | ICD-10-CM

## 2016-05-02 DIAGNOSIS — M7062 Trochanteric bursitis, left hip: Secondary | ICD-10-CM

## 2016-05-02 DIAGNOSIS — M47812 Spondylosis without myelopathy or radiculopathy, cervical region: Secondary | ICD-10-CM

## 2016-05-02 DIAGNOSIS — M7061 Trochanteric bursitis, right hip: Secondary | ICD-10-CM

## 2016-05-02 DIAGNOSIS — Z8679 Personal history of other diseases of the circulatory system: Secondary | ICD-10-CM

## 2016-05-02 DIAGNOSIS — M4156 Other secondary scoliosis, lumbar region: Secondary | ICD-10-CM

## 2016-05-02 DIAGNOSIS — E785 Hyperlipidemia, unspecified: Secondary | ICD-10-CM

## 2016-05-02 DIAGNOSIS — M25562 Pain in left knee: Secondary | ICD-10-CM

## 2016-05-02 DIAGNOSIS — M19042 Primary osteoarthritis, left hand: Secondary | ICD-10-CM

## 2016-05-02 DIAGNOSIS — M19041 Primary osteoarthritis, right hand: Secondary | ICD-10-CM

## 2016-05-02 DIAGNOSIS — J849 Interstitial pulmonary disease, unspecified: Secondary | ICD-10-CM

## 2016-05-02 NOTE — Progress Notes (Signed)
Office Visit Note  Patient: [name]             Date of Birth: Apr 22, 1946           MRN: [id number]             PCP: Sherrie Mustache, MD Referring: Dione Housekeeper, MD Visit Date: 05/02/2016 Occupation: '@GUAROCC' @    Subjective:  Left knee pain   History of Present Illness: [name] is a 71 y.o. female with history of osteoarthritis. She states she continues to have some discomfort in her bilateral feet and ankles. Hands are stiff but doing better with exercises. She has fallen twice since Thanksgiving. She is having discomfort in her bilateral knee joints more so in the left than the right knee. Her hands swell at times. She does have some lower back pain especially at night. She states her balance is not good. She went for physical therapy which helped her with trochanteric bursitis. 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   Activities of Daily Living:  Patient reports morning stiffness for1  hour.   Patient Reports nocturnal pain.  Difficulty dressing/grooming: Denies Difficulty climbing stairs: Reports Difficulty getting out of chair: Reports Difficulty using hands for taps, buttons, cutlery, and/or writing: Reports   Review of Systems  Constitutional: Positive for fatigue and weight gain. Negative for night sweats, weight loss and weakness.  HENT: Positive for mouth dryness. Negative for mouth sores, trouble swallowing, trouble swallowing and nose dryness.   Eyes: Positive for dryness. Negative for pain, redness and visual disturbance.  Respiratory: Positive for shortness of breath. Negative for cough and difficulty breathing.   Cardiovascular: Negative for chest pain, palpitations, hypertension, irregular heartbeat and swelling in legs/feet.  Gastrointestinal: Negative for blood in stool, constipation and diarrhea.  Endocrine: Negative for increased urination.  Genitourinary: Negative for vaginal dryness.  Musculoskeletal:  Positive for arthralgias, joint pain, myalgias, morning stiffness and myalgias. Negative for joint swelling, muscle weakness and muscle tenderness.  Skin: Negative for color change, rash, hair loss, skin tightness, ulcers and sensitivity to sunlight.  Allergic/Immunologic: Negative for susceptible to infections.  Neurological: Negative for dizziness, memory loss and night sweats.  Hematological: Negative for swollen glands.  Psychiatric/Behavioral: Positive for sleep disturbance. Negative for depressed mood. The patient is nervous/anxious.     PMFS History:  Patient Active Problem List   Diagnosis Date Noted  . Primary osteoarthritis of both hands 05/01/2016  . Primary osteoarthritis of both knees 05/01/2016  . Spondylosis of lumbar region without myelopathy or radiculopathy 05/01/2016  . Trochanteric bursitis of both hips 05/01/2016  . 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 History of sleep apnea 05/01/2016  . History of hypertension 05/01/2016  . 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 Dyslipidemia 05/01/2016  . 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 Arthritis   . Bursitis   . Diabetes mellitus without complication ([place])   . Fibromyalgia   . GERD (gastroesophageal reflux disease)   . Hyperlipemia   . Hypertension   . Sinusitis   . Sleep apnea    uses a cpap  . Sleep apnea   . Wears glasses     Family History  Problem Relation Age of Onset  . Intracerebral hemorrhage Sister    Past Surgical History:  Procedure Laterality Date  . ABDOMINAL HYSTERECTOMY  1999  . BREAST SURGERY  2002   rt br bx  . CERVICAL FUSION  2007  . CHOLECYSTECTOMY  2005   lap choli  . COLONOSCOPY    . FINGER GANGLION CYST EXCISION    . SHOULDER ARTHROSCOPY WITH OPEN ROTATOR CUFF REPAIR AND DISTAL CLAVICLE ACROMINECTOMY Right 09/09/2012   Procedure: Right Shoulder Arthroscopy with distal clavicle excision and mini-open rotator cuff repair.;  Surgeon:  Alta Corning, MD;  Location: [facility];  Service: Orthopedics;  Laterality: Right;  . TONSILLECTOMY     Social History   Social History Narrative  . No narrative on file     Objective: Vital Signs: BP 122/64   Pulse 72   Resp 14   Ht '5\' 4"'  (1.626 m)   Wt 255 lb (115.7 kg)   BMI 43.77 kg/m    Physical Exam  Constitutional: She is oriented to person, place, and time. She appears well-developed and well-nourished.  HENT:  Head: Normocephalic and atraumatic.  Eyes: Conjunctivae and EOM are normal.  Neck: Normal range of motion.  Cardiovascular: Normal rate, regular rhythm, normal heart sounds and intact distal pulses.   Pulmonary/Chest: Effort normal and breath sounds normal.  Abdominal: Soft. Bowel sounds are normal.  Lymphadenopathy:    She has no cervical adenopathy.  Neurological: She is alert and oriented to person, place, and time.  Skin: Skin is warm and dry. Capillary refill takes less than 2 seconds.  Psychiatric: She has a normal mood and affect. Her behavior is normal.  Nursing note and vitals reviewed.    Musculoskeletal Exam: C-spine and thoracic lumbar spine limited range of motion with some discomfort. Shoulder joints elbow joints wrist joints with good range of motion. She is thickening of PIP/DIP joints bilaterally in her hands consistent with osteoarthritis no synovitis was noted. Hip joints knee joints ankles MTPs Is a good range of motion she has some discomfort with range of motion of her left knee joint. No warmth or effusion was noted. She has right ankle brace from a prior injury.  CDAI Exam: No CDAI exam completed.    Investigation: Findings:   MRI report of her C-spine from May 2014, which showed multilevel spondylosis and C6-7 fusion. 04/25/2014  X-ray of bilateral hands, 2 views, today, showed bilateral CMC narrowing, all DIP narrowing, erosive changes in bilateral 2nd DIP joints, more prominent in the right than in the left and  2nd DIP joints also showed significant narrowing and erosive changes.  The 5th DIPs were also narrowed.  Bilateral knee joint x-rays show right knee joint within normal limits, left showed moderate medial compartment narrowing without chondrocalcinosis.  She has moderate patellofemoral narrowing.  These findings were consistent with moderate osteoarthritis and moderate chondromalacia patella.  Lumbar spine x-rays were also obtained, which showed severe scoliosis, multilevel spondylosis with L2-3, 3-4, 4-5 narrowing.  No SI joint changes were noted.    06/08/2014 After informed consent was obtained, an ultrasound examination of the bilateral hands, the bilateral 2nd MCP joints and the bilateral 2nd, 3rd and 5th digit PIP joints and the bilateral wrist joints, both dorsal and volar aspects, were evaluated to look for any underlying synovitis or tenosynovitis.  The findings were:  She had narrowing of all PIP joints bilaterally and also severe in the DIP joints bilaterally.   There was no synovitis or tenosynovitis noted.  The wrist joint was also negative for any synovitis or tenosynovitis.  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  She did not have any symptoms of carpal tunnel syndrome today.     IMPRESSION AND PLAN:  These findings did not show any inflammatory arthritis.  These findings were consistent with osteoarthritis.    December 2015:  CBC was normal.  Comprehensive metabolic panel showed ALT of 49, glucose 141, sed rate 1.  Hepatitis panel was negative.  IgG was low at 670 which is borderline low.  HLA-B27 was negative.  Hepatitis panel, TB Gold, and SPEP were negative.  Uric acid was 4.2.    Imaging: Ct Chest High Resolution  Result Date: 04/11/2016 CLINICAL DATA:  Ocular myasthenia.  Cough.  Evaluate for thymoma. EXAM: CT CHEST WITHOUT CONTRAST  TECHNIQUE: Multidetector CT imaging of the chest was performed following the standard protocol without intravenous contrast. High resolution imaging of the lungs, as well as inspiratory and expiratory imaging, was performed. COMPARISON:  None. FINDINGS: Cardiovascular: Normal heart size. No significant pericardial fluid/thickening. Left anterior descending, left circumflex and right coronary atherosclerosis. Atherosclerotic nonaneurysmal thoracic aorta. Normal caliber pulmonary arteries. Mediastinum/Nodes: No discrete thyroid nodules. Unremarkable esophagus. No pathologically enlarged axillary, mediastinal or gross hilar lymph nodes, noting limited sensitivity for the detection of hilar adenopathy on this noncontrast study. No anterior mediastinal mass. Lungs/Pleura: No pneumothorax. No pleural effusion. No acute consolidative airspace disease, lung masses or significant pulmonary nodules. There is mild patchy subpleural reticulation and ground-glass attenuation in both lungs with a basilar predominance. There is associated mild bronchiectasis and bronchiolectasis at the lung bases. No frank honeycombing. No significant air trapping on the expiration sequence. Upper abdomen: Cholecystectomy. Musculoskeletal: No aggressive appearing focal osseous lesions. Moderate thoracic spondylosis. IMPRESSION: 1. No anterior mediastinal mass. 2. Mild basilar predominant patchy subpleural reticulation, ground-glass attenuation and associated bronchiectasis/bronchiolectasis. This spectrum of findings may indicate an interstitial lung disease, with the differential including fibrotic nonspecific interstitial pneumonia (NSIP) or usual interstitial pneumonia (UIP). A follow-up high-resolution chest CT is recommended in 12 months to assess temporal pattern stability. 3. Aortic atherosclerosis.  Three-vessel coronary atherosclerosis. Electronically Signed   By: Ilona Sorrel M.D.   On: 04/11/2016 12:19   Xr Knee 3 View Left  Result  Date: 05/02/2016 Moderate medial compartment narrowing. Moderate patellofemoral narrowing. No patellar fracture was noted. She has some posterior loose bodies. Impression: Moderate osteoarthritis of the left knee joint with chondromalacia patella   Speciality Comments: No specialty comments available.    Procedures:  No procedures performed Allergies: Celebrex [celecoxib] and Vioxx [rofecoxib]   Assessment / Plan:     Visit Diagnoses: Acute pain of left knee -patient had 2 falls since Thanksgiving. She states her left knee joint continues to hurt. No warmth swelling or effusion was noted. Plan: XR KNEE 3 VIEW LEFT  Primary osteoarthritis of both hands: She continues to have some pain and stiffness. There is no synovitis on examination. She's using ring splints which is been helpful.  Primary osteoarthritis of both knees: She has known history of osteoarthritis of bilateral knee joints which causes discomfort. Weight loss diet and exercise was discussed.  DJD C-spine - Status post fusion: She has limited range of motion and some discomfort.  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 She has no clinical features of autoimmune disease on examination. She'll be seeing a pulmonologist for further evaluation. In the  meantime I'll obtain some labs. Which will include CK ANA ENA and ANCA rheumatoid factor and CCP. I will refer her to pulmonary for evaluation.  Scoliosis  Trochanteric bursitis of both hips: Has improved after physical therapy.  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 XR KNEE 3 VIEW LEFT  . CK    . Rheumatoid factor  . Sedimentation rate  . Cyclic citrul peptide antibody, IgG  . Angiotensin converting enzyme  . ENA 9 Panel  . ANA  . Pan-ANCA  . Ambulatory referral to Pulmonology   No orders of the defined types were placed in this encounter.   Face-to-face time spent with patient was 35 minutes. 50% of time was spent in counseling and coordination of care.  Follow-Up Instructions: Return in about 3 months (around 07/31/2016) for Osteoarthritis, interstitial lung disease.   Bo Merino, MD

## 2016-05-03 LAB — CK: CK TOTAL: 99 U/L (ref 7–177)

## 2016-05-03 LAB — RHEUMATOID FACTOR

## 2016-05-03 LAB — ANA: ANA: NEGATIVE

## 2016-05-03 LAB — SEDIMENTATION RATE: SED RATE: 4 mm/h (ref 0–30)

## 2016-05-03 LAB — CYCLIC CITRUL PEPTIDE ANTIBODY, IGG: CYCLIC CITRULLIN PEPTIDE AB: 109 U — AB

## 2016-05-03 LAB — ANGIOTENSIN CONVERTING ENZYME: ANGIOTENSIN-CONVERTING ENZYME: 34 U/L (ref 9–67)

## 2016-05-03 NOTE — Progress Notes (Signed)
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 Patient has appointment with pulmonary coming up for evaluation of ILD

## 2016-05-07 ENCOUNTER — Telehealth (INDEPENDENT_AMBULATORY_CARE_PROVIDER_SITE_OTHER): Payer: Self-pay | Admitting: Rheumatology

## 2016-05-08 LAB — CP5000020 ENA PANEL
ENA SM AB SER-ACNC: NEGATIVE
RIBONUCLEIC PROTEIN(ENA) ANTIBODY, IGG: NEGATIVE
SCLERODERMA (SCL-70) (ENA) ANTIBODY, IGG: NEGATIVE
SSA (Ro) (ENA) Antibody, IgG: <1
SSB (La) (ENA) Antibody, IgG: <1

## 2016-05-08 LAB — PAN-ANCA
ANCA Screen: NEGATIVE
Myeloperoxidase Abs: <1
Serine Protease 3: <1

## 2016-05-14 ENCOUNTER — Encounter: Payer: Self-pay | Admitting: Neurology

## 2016-05-14 ENCOUNTER — Ambulatory Visit (INDEPENDENT_AMBULATORY_CARE_PROVIDER_SITE_OTHER): Payer: Medicare Other | Admitting: Neurology

## 2016-05-14 VITALS — BP 90/62 | HR 65 | Wt 214.0 lb

## 2016-05-14 DIAGNOSIS — G7000 Myasthenia gravis without (acute) exacerbation: Secondary | ICD-10-CM

## 2016-05-14 MED ORDER — PYRIDOSTIGMINE BROMIDE 60 MG PO TABS: ORAL_TABLET | ORAL | 3 refills | Status: DC

## 2016-05-14 NOTE — Patient Instructions (Addendum)
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 Continue Mestinon 30 mg 3 times daily but she may increase it to 4 times daily as needed. We discussed the role of prednisone but given her diabetes she is reluctant to try that. She was given a refill of Mestinon and advised to return for follow-up in 3 months with nurse practitioner or call earlier if necessary.  Myasthenia Gravis Introduction Myasthenia gravis (MG) means severe weakness. It is a long-term (chronic) condition that causes weakness in the muscles you can control (voluntary muscles). MG can affect any voluntary muscle. The muscles most often affected are the ones that control:  Eye movement.  Facial movements.  Swallowing. MG is an autoimmune disease, which means that your body's defense system (immune system) attacks healthy parts of your body instead of germs and other things that make you sick. When you have MG, your immune system makes proteins (antibodies) that block the chemical (acetylcholine) your body needs to send nerve signals to your muscles. This causes muscle weakness. What are the causes? The exact cause of MG is unknown. One possible cause is an enlarged thymus gland, which is located under your breastbone. What are the signs or symptoms? The earliest symptom of MG is muscle weakness that gets worse with activity and gets better after rest. Other symptoms of MG may include:  Drooping eyelids.  Double vision.  Loss of facial expression.  Trouble chewing and swallowing.  Slurred speech.  A waddling walk.  Weakness of the arms, hands, and legs. Trouble breathing is the most dangerous symptom of MG. Sudden and severe difficulty breathing (myasthenic crisis) may require emergency breathing support. This symptom sometimes happens after:  Infection.  Fever.  Drug reaction. How is this diagnosed? It can be hard to  diagnose MG because muscle weakness is a common symptom in many conditions. Your health care provider will do a physical exam. You may also have tests that will help make a diagnosis. These may include:  A blood test.  A test using the medicine edrophonium. This medicine increases muscle strength by slowing the breakdown of acetylcholine.  Tests to measure nerve conduction to muscle (electromyography).  An imaging study of the chest (CT or MRI). How is this treated? Treatment can improve muscle strength. Sometimes symptoms of MG go away for a while (remission) and you can stop treatment. Possible treatments include:  Medicine.  Removal of the thymus gland (thymectomy). This may result in a long remission for some people. Follow these instructions at home:  Take medicines only as directed by your health care provider.  Get plenty of rest to conserve your energy.  Take frequent breaks to rest your eyes.  Maintain a healthy diet and a healthy weight.  Do not use any tobacco products including cigarettes, chewing tobacco, or electronic cigarettes. If you need help quitting, ask your health care provider.  Keep all follow-up visits as directed by your health care provider. This is important. Contact a health care provider if:  Your symptoms get worse after a fever or infection.  You have a reaction to a medicine you are taking.  Your symptoms change or get worse. Get help right away if: You have trouble breathing. This information is not intended to replace advice given to you by your health care provider. Make sure you discuss any questions you have with your health care provider. Document Released: 07/22/2000 Document Revised:  09/21/2015 Document Reviewed: 06/16/2013  2017 Elsevier

## 2016-05-14 NOTE — Progress Notes (Signed)
[facility] [address] [place]. [place] [address] [phone]       OFFICE FOLLOW UP VISIT NOTE  Crystal. [name] Date of Birth:  01/11/1946 Medical Record Number:  [id number]   Referring MD:   Shelbie Proctor  Reason for Referral: myasthenia  [id number] Consult  03/07/16 ;  [name] is a 68 year pleasant Caucasian lady whose had droopy eyelids first started in December 2016 when she stated states that "I was drooping and she will barely open it. She was seen by primary physician who thought she had partial Bell's palsy. This did not get better and subsequently in June she saw ophthalmologist who noticed that she had some infection on the lower eyelid treated with antibiotics. Since then she started that she has intermittent opening of both eyelids left more than right. There are times when arises is completely shut and on other times she can see well. In fact on inquiry she states that a couple of occasion while driving she was seeing double the dividing lines of the road for crossing over. She is also noticed that she gets fatigued and tired very easily. Initially when this first began this was thought to be related to viral infection but this does not seem to be getting better. She denies any difficulty moving her eyes, any dysphagia, weakness in her arms or legs. She has no trouble getting out, chair or climbing up and down steps. She has a good appetite and she had not lost any weight. Review of electronic medical record shows that she had elevated acetylcholine receptor antibodies on 01/15/16 with acetylcholine binding antibody of 6.16, blocking antibody 31 and modulating antibody 48 all of which are abnormal. Patient has not been tried on Mestinon on other medications for myasthenia. She denies any history of headaches, strokes, TIAs or other neurologic problems. Update 05/14/2016 : She returns for follow-up after last visit 2 months ago. She states she has noticed  benefit on Mestinon and eyes do not droop as much and she is not had any episodes of diplopia. She doesn't feel tired as much as well. However she was unable to tolerate 60 mg dose of Mestinon due to stomach cramps, nausea and is taking only 30 mg mostly 3 times a day and takes an occasional food dose if needed. She did undergo nerve conduction study with rapid repetitive stimulation on 04/10/16 which was negative for decremental response. The patient is reluctant to consider prednisone given her diabetes and risk of blood sugars being elevated on it ROS:   14 system review of systems is positive for  blurred vision,  snoring, shortness of breath, diarrhea, easy bruising, cramps, allergies, dizziness and all other systems negative  PMH:  Past Medical History:  Diagnosis Date  . Arthritis   . Bursitis   . Diabetes mellitus without complication ([place])   . Fibromyalgia   . GERD (gastroesophageal reflux disease)   . Hyperlipemia   . Hypertension   . Sinusitis   . Sleep apnea    uses a cpap  . Sleep apnea   . Wears glasses     Social History:  Social History   Social History  . Marital status: Married    Spouse name: N/A  . Number of children: N/A  . Years of education: N/A   Occupational History  . Not on file.   Social History Main Topics  . Smoking status: Never Smoker  . Smokeless tobacco: Never Used  . Alcohol use  No  . Drug use: No  . Sexual activity: Not on file   Other Topics Concern  . Not on file   Social History Narrative  . No narrative on file    Medications:   Current Outpatient Prescriptions on File Prior to Visit  Medication Sig Dispense Refill  . APPLE CIDER VINEGAR PO Take by mouth.    . Biotin 5000 MCG TABS Take by mouth.    . Cholecalciferol (VITAMIN D3) 1000 units CAPS Take by mouth.    . co-enzyme Q-10 30 MG capsule Take 30 mg by mouth 3 (three) times daily.    . [employer] NATURAL LUTEIN EYE HEALTH PO Take by mouth.    . fluticasone (FLONASE) 50  MCG/ACT nasal spray     . gabapentin (NEURONTIN) 100 MG capsule Take 100 mg by mouth 2 (two) times daily.    Marland Kitchen gabapentin (NEURONTIN) 300 MG capsule Take by mouth daily.     Marland Kitchen glipiZIDE (GLUCOTROL XL) 10 MG 24 hr tablet 2 (two) times daily.     Marland Kitchen losartan-hydrochlorothiazide (HYZAAR) 100-25 MG per tablet Take 1 tablet by mouth daily.    . metFORMIN (GLUCOPHAGE-XR) 500 MG 24 hr tablet     . Misc Natural Products (TART CHERRY ADVANCED) CAPS Take by mouth.    . Multiple Vitamins-Minerals (HAIR SKIN AND NAILS FORMULA PO) Take by mouth.    . Nutritional Supplements (EQ ESTROBLEND MENOPAUSE PO) Take by mouth.    . omega-3 acid ethyl esters (LOVAZA) 1 G capsule Take 2 g by mouth daily.    . pregabalin (LYRICA) 50 MG capsule Take 50 mg by mouth 2 (two) times daily.     . rosuvastatin (CRESTOR) 10 MG tablet Take 10 mg by mouth daily. Takes 1/2    . sitaGLIPtin (JANUVIA) 100 MG tablet Take 100 mg by mouth daily.     No current facility-administered medications on file prior to visit.     Allergies:   Allergies  Allergen Reactions  . Celebrex [Celecoxib] Swelling    hives  . Vioxx [Rofecoxib] Hives    Physical Exam General: well developed, well nourished middle-aged obese Caucasian lady, seated, in no evident distress Head: head normocephalic and atraumatic.   Neck: supple with no carotid or supraclavicular bruits Cardiovascular: regular rate and rhythm, no murmurs Musculoskeletal: no deformity Skin:  no rash/petichiae Vascular:  Normal pulses all extremities  Neurologic Exam Mental Status: Awake and fully alert. Oriented to place and time. Recent and remote memory intact. Attention span, concentration and fund of knowledge appropriate. Mood and affect appropriate.  Cranial Nerves: Fundoscopic exam reveals Not done. Pupils equal, briskly reactive to light. Extraocular movements full without nystagmus. She has mild resting ptosis on the left but with sustained upgaze she has bilateral ptosis  left greater than right but no subjective diplopia. Visual fields full to confrontation. Hearing intact. Facial sensation intact. Face, tongue, palate moves normally and symmetrically.  Motor: Normal bulk and tone. Normal strength in all tested extremity muscles. Sensory.: intact to touch , pinprick , position and vibratory sensation.  Coordination: Rapid alternating movements normal in all extremities. Finger-to-nose and heel-to-shin performed accurately bilaterally. Gait and Station: Arises from chair without difficulty. Stance is normal. Gait demonstrates normal stride length and balance . Able to heel, toe and tandem walk without difficulty.  Reflexes: 1+ and symmetric. Toes downgoing.       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 She has positive acetylcholine receptor antibodies.   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 Continue Mestinon 30 mg 3 times daily but she may increase it to 4 times daily as needed. We discussed the role of prednisone but given her diabetes she is reluctant to try that. She was given a refill of Mestinon and advised to return for follow-up in 3 months with nurse practitioner or call earlier if necessary. Antony Contras, MD  [facility] [address] [place], [place] [address]  Phone [phone] Fax [phone] Note: This document was prepared with digital dictation and possible smart phrase technology. Any transcriptional errors that result from this process are unintentional.

## 2016-06-04 ENCOUNTER — Ambulatory Visit (INDEPENDENT_AMBULATORY_CARE_PROVIDER_SITE_OTHER): Payer: Medicare Other | Admitting: Internal Medicine

## 2016-06-04 ENCOUNTER — Encounter: Payer: Self-pay | Admitting: Internal Medicine

## 2016-06-04 VITALS — BP 126/68 | HR 72 | Ht 64.0 in | Wt 217.0 lb

## 2016-06-04 DIAGNOSIS — R0600 Dyspnea, unspecified: Secondary | ICD-10-CM | POA: Insufficient documentation

## 2016-06-04 DIAGNOSIS — R0689 Other abnormalities of breathing: Secondary | ICD-10-CM | POA: Diagnosis not present

## 2016-06-04 DIAGNOSIS — J849 Interstitial pulmonary disease, unspecified: Secondary | ICD-10-CM | POA: Diagnosis not present

## 2016-06-04 MED ORDER — BUDESONIDE-FORMOTEROL FUMARATE 80-4.5 MCG/ACT IN AERO: 2.0000 | INHALATION_SPRAY | Freq: Two times a day (BID) | RESPIRATORY_TRACT | 0 refills | Status: DC

## 2016-06-04 NOTE — Patient Instructions (Signed)
ICD-9-CM ICD-10-CM   1. Dyspnea and respiratory abnormality 786.09 R06.00     R06.89   2. ILD (interstitial lung disease) (HCC) 515 J84.9     Try inhaler ics/laba scheduled for 2 weeks and call back [phone] and ask for triage line and report  how you feel  Meanwhile, try weight loss  Repeat HRCT and PFT (no lung vol, no bd spiro) in 6-9 months   Followup  - Dr Chase Caller after HRCT and PFT

## 2016-06-04 NOTE — Progress Notes (Addendum)
Subjective:    Patient ID: [name], female    DOB: [birth year], 71 y.o.   MRN: [id number] PCP Sherrie Mustache, MD  HPI  IOV 06/04/2016  Chief Complaint  Patient presents with  . Advice Only    referred by Dr. Estanislado Pandy for ILD, abn ct.     71 year old female referred by Dr. Keturah Barre in the rheumatology clinic for evaluation of dyspnea in the setting of positive CCP and CT chest suggestive of ILD  History is taken from her and review of the outside chart of rheumatology and neurology January 2018  She is significantly obese although this is been stable for the last several years. Several years ago she developed knee issues and is wearing a brace/shoulder and her right lower extremity. Since then she's been less mobile and less active. Corresponding with this she's had insidious onset of dyspnea on exertion for climbing stairs and other activities that are class II-III nature. This dyspnea has been stable all along without any associated weight change. It is present with exertion relieved by rest. Is known orthopnea paroxysmal nocturnal dyspnea. She's had occasional associated dry cough but this is not consistent in the same with wheezing which is not consistent. There is no associated chest pain.  According to her history she's been seeing Dr. 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 According to her and review of the chart this is localized only to the ocular muscles she is on pyridostigmine. At this time a high-resolution CT chest was done 04/27/2016 that I personally visualized that shows coronary artery calcification which is unaware of and also mild subtle ILD changes. She subsequently saw Dr. Blake Divine January 2018 and autoimmune profile showed elevated CCP that was isolated but no clinical or systemic evidence of rheumatoid arthritis. The diagnoses on the joints is still osteoarthritis.  Therefore she's been referred here. There is no associated chest pain     review of the chart shows she's not had an echocardiogram a cardiac stress test btu she tells me she has one at [facility] with her cardioloigist 2 years ago was normal  Walking desaturation test 185 feet 3 laps on room air: Did not desaturate.  Results for Crystal, [name] (MRN [id number]) as of 06/04/2016 15:47  Ref. Range 01/15/2016 13:00  Acetylchol Block Ab Latest Ref Range: 0 - 25 % 31 (H)  Acety choline binding ab Latest Ref Range: 0.00 - 0.24 nmol/L 6.16 (H)  Acetylcholine Modulat Ab Latest Ref Range: 0 - 20 % 48 (H)   Results for Crystal, [name] (MRN [id number]) as of 06/04/2016 15:47  Ref. Range 05/02/2016 10:00  CK Total Latest Ref Range: 7 - 177 U/L 99  Sed Rate Latest Ref Range: 0 - 30 mm/hr 4  Anit Nuclear Antibody(ANA) Latest Ref Range: NEGATIVE  NEG  Angiotensin-Converting Enzyme Latest Ref Range: 9 - 67 U/L 34  Cyclic Citrullin Peptide Ab Latest Units: Units 109 (H)  ds DNA Ab Latest Units: IU/mL <1  Myeloperoxidase Abs Latest Ref Range: <1.0 AI  <1.0  Serine Protease 3 Latest Ref Range: <1.0 AI  <1.0  RA Latex Turbid. Latest Ref Range: <14 IU/mL <14  ENA SM Ab Ser-aCnc Latest Ref Range: <1.0 NEG AI  <1.0 NEG  Ribonucleic Protein(ENA) Antibody, IgG Latest Ref Range: <1.0 NEG AI  <1.0 NEG  SSA (Ro) (ENA) Antibody, IgG Latest Ref Range: <1.0 NEG AI  <1.0  NEG  SSB (La) (ENA) Antibody, IgG Latest Ref Range: <1.0 NEG AI  <1.0 NEG  Scleroderma (Scl-70) (ENA) Antibody, IgG Latest Ref Range: <1.0 NEG AI  <1.0 NEG     has a past medical history of Arthritis; Bursitis; Diabetes mellitus without complication ([place]); Fibromyalgia; GERD (gastroesophageal reflux disease); Hyperlipemia; Hypertension; Sinusitis; Sleep apnea; Sleep apnea; and Wears glasses.   reports that she has never smoked. She has never used smokeless tobacco.  Past Surgical History:  Procedure Laterality Date  . ABDOMINAL HYSTERECTOMY  1999  .  BREAST SURGERY  2002   rt br bx  . CERVICAL FUSION  2007  . CHOLECYSTECTOMY  2005   lap choli  . COLONOSCOPY    . FINGER GANGLION CYST EXCISION    . SHOULDER ARTHROSCOPY WITH OPEN ROTATOR CUFF REPAIR AND DISTAL CLAVICLE ACROMINECTOMY Right 09/09/2012   Procedure: Right Shoulder Arthroscopy with distal clavicle excision and mini-open rotator cuff repair.;  Surgeon: Alta Corning, MD;  Location: [facility];  Service: Orthopedics;  Laterality: Right;  . TONSILLECTOMY      Allergies  Allergen Reactions  . Celebrex [Celecoxib] Swelling    hives  . Vioxx [Rofecoxib] Hives    Immunization History  Administered Date(s) Administered  . Influenza Split 02/02/2016  . Pneumococcal-Unspecified 06/04/2013    Family History  Problem Relation Age of Onset  . Emphysema Father   . Intracerebral hemorrhage Sister      Current Outpatient Prescriptions:  .  APPLE CIDER VINEGAR PO, Take by mouth daily. , Disp: , Rfl:  .  b complex vitamins capsule, Take 1 capsule by mouth daily. , Disp: , Rfl:  .  Biotin 5000 MCG TABS, Take 1 tablet by mouth daily. , Disp: , Rfl:  .  Cholecalciferol (VITAMIN D3) 1000 units CAPS, Take by mouth., Disp: , Rfl:  .  co-enzyme Q-10 30 MG capsule, Take 30 mg by mouth 3 (three) times daily., Disp: , Rfl:  .  [employer] NATURAL LUTEIN EYE HEALTH PO, Take 1 capsule by mouth daily. , Disp: , Rfl:  .  fluticasone (FLONASE) 50 MCG/ACT nasal spray, Place 2 sprays into both nostrils daily. , Disp: , Rfl:  .  gabapentin (NEURONTIN) 100 MG capsule, Take 100 mg by mouth 2 (two) times daily., Disp: , Rfl:  .  gabapentin (NEURONTIN) 300 MG capsule, Take by mouth daily. , Disp: , Rfl:  .  glipiZIDE (GLUCOTROL XL) 10 MG 24 hr tablet, 2 (two) times daily. , Disp: , Rfl:  .  losartan-hydrochlorothiazide (HYZAAR) 100-25 MG per tablet, Take 1 tablet by mouth daily., Disp: , Rfl:  .  metFORMIN (GLUCOPHAGE-XR) 500 MG 24 hr tablet, , Disp: , Rfl:  .  Misc Natural Products (TART  CHERRY ADVANCED) CAPS, Take by mouth., Disp: , Rfl:  .  Multiple Vitamins-Minerals (HAIR SKIN AND NAILS FORMULA PO), Take 1 tablet by mouth daily. , Disp: , Rfl:  .  Nutritional Supplements (EQ ESTROBLEND MENOPAUSE PO), Take by mouth daily. , Disp: , Rfl:  .  omega-3 acid ethyl esters (LOVAZA) 1 G capsule, Take 2 g by mouth daily., Disp: , Rfl:  .  pregabalin (LYRICA) 50 MG capsule, Take 50 mg by mouth 2 (two) times daily. , Disp: , Rfl:  .  pyridostigmine (MESTINON) 60 MG tablet, 1/2 TAB 3 TIMES DAILY FOR 1 WEEK THEN 4 TIMES DAILY AND AFTER 2 WKS INCREASE TO 1 TAB 4 TIMES DAILY, Disp: 90 tablet, Rfl: 3 .  rosuvastatin (CRESTOR) 10 MG  tablet, Take 10 mg by mouth daily. Takes 1/2, Disp: , Rfl:  .  sitaGLIPtin (JANUVIA) 100 MG tablet, Take 100 mg by mouth daily., Disp: , Rfl:    Review of Systems  Constitutional: Negative for fever and unexpected weight change.  HENT: Positive for sinus pressure and sneezing. Negative for congestion, dental problem, ear pain, nosebleeds, postnasal drip, rhinorrhea, sore throat and trouble swallowing.   Eyes: Negative for redness and itching.  Respiratory: Positive for shortness of breath. Negative for cough, chest tightness and wheezing.   Cardiovascular: Negative for palpitations and leg swelling.  Gastrointestinal: Negative for nausea and vomiting.  Genitourinary: Negative for dysuria.  Musculoskeletal: Negative for joint swelling.  Skin: Negative for rash.  Neurological: Negative for headaches.  Hematological: Does not bruise/bleed easily.  Psychiatric/Behavioral: Negative for dysphoric mood. The patient is not nervous/anxious.        Objective:   Physical Exam  Constitutional: She is oriented to person, place, and time. She appears well-developed and well-nourished. No distress.  She is obese  HENT:  Head: Normocephalic and atraumatic.  Right Ear: External ear normal.  Left Ear: External ear normal.  Mouth/Throat: Oropharynx is clear and moist. No  oropharyngeal exudate.  Eyes: Conjunctivae and EOM are normal. Pupils are equal, round, and reactive to light. Right eye exhibits no discharge. Left eye exhibits no discharge. No scleral icterus.  Neck: Normal range of motion. Neck supple. No JVD present. No tracheal deviation present. No thyromegaly present.  Cardiovascular: Normal rate, regular rhythm, normal heart sounds and intact distal pulses.  Exam reveals no gallop and no friction rub.   No murmur heard. Pulmonary/Chest: Effort normal and breath sounds normal. No respiratory distress. She has no wheezes. She has no rales. She exhibits no tenderness.  Not sure she has crackles  Abdominal: Soft. Bowel sounds are normal. She exhibits no distension and no mass. There is no tenderness. There is no rebound and no guarding.  Musculoskeletal: Normal range of motion. She exhibits no edema or tenderness.  Lymphadenopathy:    She has no cervical adenopathy.  Neurological: She is alert and oriented to person, place, and time. She has normal reflexes. No cranial nerve deficit. She exhibits normal muscle tone. Coordination normal.  Skin: Skin is warm and dry. No rash noted. She is not diaphoretic. No erythema. No pallor.  Psychiatric: She has a normal mood and affect. Her behavior is normal. Judgment and thought content normal.  Vitals reviewed.  Vitals:   06/04/16 1543  BP: 126/68  Pulse: 72  SpO2: 97%  Weight: 217 lb (98.4 kg)  Height: 5\' 4"  (1.626 m)   Estimated body mass index is 37.25 kg/m as calculated from the following:   Height as of this encounter: 5\' 4"  (1.626 m).   Weight as of this encounter: 217 lb (98.4 kg).          Assessment & Plan:     ICD-9-CM ICD-10-CM   1. Dyspnea and respiratory abnormality 786.09 R06.00 Pulmonary function test    R06.89 CT Chest High Resolution  2. ILD (interstitial lung disease) ([place]) 515 J84.9 Pulmonary function test     CT Chest High Resolution     Pulmonary function test     CT Chest  High Resolution   She definitely has dyspnea which is suspect is multifactorial related to obesity and possible associated diastolic dysfunction associated with physical deconditioning. If she has systemic myasthenia this could also be contributing to dyspnea on exertion. I suspect given the long-term  stability of dyspnea this is all related to obesity and physical deconditioning and associated diastolic dysfunction that is very likely coexistent. If she has interstitial lung disease then I think it is not significant enough to be contributing to dyspnea because she did not desaturate with walking in the office  In terms of interstitial lung disease I think there is some subtle ILD. Given the fact this is not classic UIP pattern again the risk of progression is fairly low in the next one year. And given the overall stability of dyspnea I would elect to watch this is a repeat high-resolution CT chest between 6 and 9 months. At that time we'll also get a pulmonary function test.  In the interim will do empiric symbicorty to see if this would help the symptoms.   She is agreeable with this plan   Dr. Brand Males, M.D., [facility].C.P Pulmonary and Critical Care Medicine Staff Physician [facility] Pager: [phone], If no answer or between  15:00h - 7:00h: call [phone]  06/04/2016 4:22 PM

## 2016-06-04 NOTE — Addendum Note (Signed)
Addended by: Len Blalock on: 06/04/2016 04:46 PM   Modules accepted: Orders

## 2016-07-24 NOTE — Progress Notes (Signed)
Office Visit Note  Patient: [name]             Date of Birth: Oct 24, 1945           MRN: [id number]             PCP: Sherrie Mustache, MD Referring: Dione Housekeeper, MD Visit Date: 08/01/2016 Occupation: '@GUAROCC' @    Subjective:  Left hip pain   History of Present Illness: [name] is a 71 y.o. female with history of osteoarthritis and disc disease. She states she's been having some recurrence of pain in her left trochanteric bursa. Knee joint pain is better. She continues to have some stiffness in her hands. Neck pain and lower back pain persist. She saw Dr. Chase Caller for interstitial lung disease. He felt that her symptoms are stable and mild and no treatment is needed at this point. She states she was having left shoulder joint pain for which she was seen by an orthopedic surgeon. She had left shoulder joint cortisone injection which lasted only for short time. She was also having problems with the left lateral epicondylitis for which she got a tennis elbow brace and it has helped.  Activities of Daily Living:  Patient reports morning stiffness for 1 hour.   Patient Reports nocturnal pain.  Difficulty dressing/grooming: Denies Difficulty climbing stairs: Reports Difficulty getting out of chair: Reports Difficulty using hands for taps, buttons, cutlery, and/or writing: Denies   Review of Systems  Constitutional: Positive for fatigue. Negative for night sweats, weight gain, weight loss and weakness.  HENT: Positive for mouth dryness. Negative for mouth sores, trouble swallowing, trouble swallowing and nose dryness.   Eyes: Positive for dryness. Negative for pain, redness and visual disturbance.  Respiratory: Positive for shortness of breath. Negative for cough and difficulty breathing.   Cardiovascular: Negative for chest pain, palpitations, hypertension, irregular heartbeat and swelling in legs/feet.  Gastrointestinal: Negative for blood in stool,  constipation and diarrhea.  Endocrine: Negative for increased urination.  Genitourinary: Negative for vaginal dryness.  Musculoskeletal: Positive for arthralgias, joint pain, myalgias, morning stiffness and myalgias. Negative for joint swelling, muscle weakness and muscle tenderness.  Skin: Negative for color change, rash, hair loss, skin tightness, ulcers and sensitivity to sunlight.  Allergic/Immunologic: Negative for susceptible to infections.  Neurological: Negative for dizziness, memory loss and night sweats.  Hematological: Negative for swollen glands.  Psychiatric/Behavioral: Positive for sleep disturbance. Negative for depressed mood. The patient is not nervous/anxious.     PMFS History:  Patient Active Problem List   Diagnosis Date Noted  . ILD (interstitial lung disease) ([place]) 06/04/2016  . Dyspnea and respiratory abnormality 06/04/2016  . Primary osteoarthritis of both hands 05/01/2016  . Primary osteoarthritis of both knees 05/01/2016  . Spondylosis of lumbar region without myelopathy or radiculopathy 05/01/2016  . Trochanteric bursitis of both hips 05/01/2016  . 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 History of sleep apnea 05/01/2016  . History of hypertension 05/01/2016  . 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 Dyslipidemia 05/01/2016  . 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 Arthritis   . Bursitis   . Diabetes mellitus without complication ([place])   . Fibromyalgia   . GERD (gastroesophageal reflux disease)   . Hyperlipemia   . Hypertension   . Sinusitis   . Sleep apnea    uses a cpap  . Sleep apnea   . Wears glasses     Family History  Problem  Relation Age of Onset  . Emphysema Father   . Intracerebral hemorrhage Sister    Past Surgical History:  Procedure Laterality Date  . ABDOMINAL HYSTERECTOMY  1999  . BREAST SURGERY  2002   rt br bx  . CERVICAL FUSION  2007  . CHOLECYSTECTOMY  2005   lap choli  .  COLONOSCOPY    . FINGER GANGLION CYST EXCISION    . SHOULDER ARTHROSCOPY WITH OPEN ROTATOR CUFF REPAIR AND DISTAL CLAVICLE ACROMINECTOMY Right 09/09/2012   Procedure: Right Shoulder Arthroscopy with distal clavicle excision and mini-open rotator cuff repair.;  Surgeon: Alta Corning, MD;  Location: [facility];  Service: Orthopedics;  Laterality: Right;  . TONSILLECTOMY     Social History   Social History Narrative  . No narrative on file     Objective: Vital Signs: BP 130/74   Pulse 68   Resp 14   Ht '5\' 5"'  (1.651 m)   Wt 210 lb (95.3 kg)   BMI 34.95 kg/m    Physical Exam  Constitutional: She is oriented to person, place, and time. She appears well-developed and well-nourished.  HENT:  Head: Normocephalic and atraumatic.  Eyes: Conjunctivae and EOM are normal.  Neck: Normal range of motion.  Cardiovascular: Normal rate, regular rhythm, normal heart sounds and intact distal pulses.   Pulmonary/Chest: Effort normal and breath sounds normal.  Abdominal: Soft. Bowel sounds are normal.  Lymphadenopathy:    She has no cervical adenopathy.  Neurological: She is alert and oriented to person, place, and time.  Skin: Skin is warm and dry. Capillary refill takes less than 2 seconds.  Psychiatric: She has a normal mood and affect. Her behavior is normal.  Nursing note and vitals reviewed.    Musculoskeletal Exam: C-spine good range of motion thoracic kyphosis noted lumbar spine limited range of motion. She has good range of motion of her shoulders elbow joints wrist joints MCPs PIPs she has thickening of DIP PIP joints consistent with osteoarthritis. She had tenderness on palpation over left trochanteric bursa and right initial bursa consistent with bursitis. Knee joints ankles MTPs with good range of motion with no synovitis.  CDAI Exam: No CDAI exam completed.    Investigation: Findings:  05/02/2016 CK normal, ESR 4, ANA negative, ENA negative, Ace normal, and ANCA  negative, rheumatoid factor negative, anti-CCP antibody 109    Imaging: No results found.  Speciality Comments: No specialty comments available.    Procedures:  No procedures performed Allergies: Latex; Celebrex [celecoxib]; and Vioxx [rofecoxib]   Assessment / Plan:     Visit Diagnoses: Primary osteoarthritis of both hands: She continues to have some discomfort and pain in her bilateral hands joint protection and muscle strengthening discussed. There was no synovitis noted on examination.  Trochanteric bursitis of both hips: She's been having discomfort in her left trochanteric bursa especially. ITB and exercise was discussed.  Right's shoulder bursitis: His stretching exercises were discussed. She was also advised to use some question while she is sitting for prolonged periods.  Primary osteoarthritis of both knees: Weight loss diet and exercise was discussed.  DJD C-spine  DDD lumbar spine: She has chronic pain  ILD (interstitial lung disease) ([facility]) - Anti-CCP antibody 109, RF negative, ANA negative, ENA negative, pan ANCA negative, mild disease followed up by Dr. Chase Caller. Her lungs this disease is mild and stable at this point. She has no clinical features of autoimmune disease on examination.  Ocular myasthenia (HCC)  History of hypertension  Dyslipidemia  History of sleep apnea : Using CPAP.   Consider ultrasound bilateral hands   Orders: No orders of the defined types were placed in this encounter.  No orders of the defined types were placed in this encounter.   Face-to-face time spent with patient was 30 minutes. 50% of time was spent in counseling and coordination of care.  Follow-Up Instructions: Return in about 6 months (around 01/31/2017) for Osteoarthritis, ILD.   Bo Merino, MD  Note - This record has been created using Editor, commissioning.  Chart creation errors have been sought, but may not always  have been located. Such creation errors do  not reflect on  the standard of medical care.

## 2016-08-01 ENCOUNTER — Ambulatory Visit (INDEPENDENT_AMBULATORY_CARE_PROVIDER_SITE_OTHER): Payer: Medicare Other | Admitting: Rheumatology

## 2016-08-01 ENCOUNTER — Encounter: Payer: Self-pay | Admitting: Rheumatology

## 2016-08-01 VITALS — BP 130/74 | HR 68 | Resp 14 | Ht 65.0 in | Wt 210.0 lb

## 2016-08-01 DIAGNOSIS — Z8669 Personal history of other diseases of the nervous system and sense organs: Secondary | ICD-10-CM

## 2016-08-01 DIAGNOSIS — M47812 Spondylosis without myelopathy or radiculopathy, cervical region: Secondary | ICD-10-CM

## 2016-08-01 DIAGNOSIS — Z8679 Personal history of other diseases of the circulatory system: Secondary | ICD-10-CM | POA: Diagnosis not present

## 2016-08-01 DIAGNOSIS — E785 Hyperlipidemia, unspecified: Secondary | ICD-10-CM

## 2016-08-01 DIAGNOSIS — M19042 Primary osteoarthritis, left hand: Secondary | ICD-10-CM | POA: Diagnosis not present

## 2016-08-01 DIAGNOSIS — M7061 Trochanteric bursitis, right hip: Secondary | ICD-10-CM | POA: Diagnosis not present

## 2016-08-01 DIAGNOSIS — M19041 Primary osteoarthritis, right hand: Secondary | ICD-10-CM

## 2016-08-01 DIAGNOSIS — M5030 Other cervical disc degeneration, unspecified cervical region: Secondary | ICD-10-CM | POA: Diagnosis not present

## 2016-08-01 DIAGNOSIS — M47816 Spondylosis without myelopathy or radiculopathy, lumbar region: Secondary | ICD-10-CM

## 2016-08-01 DIAGNOSIS — M170 Bilateral primary osteoarthritis of knee: Secondary | ICD-10-CM

## 2016-08-01 DIAGNOSIS — M7062 Trochanteric bursitis, left hip: Secondary | ICD-10-CM

## 2016-08-01 DIAGNOSIS — J849 Interstitial pulmonary disease, unspecified: Secondary | ICD-10-CM | POA: Diagnosis not present

## 2016-08-01 DIAGNOSIS — G7000 Myasthenia gravis without (acute) exacerbation: Secondary | ICD-10-CM

## 2016-08-15 ENCOUNTER — Encounter: Payer: Self-pay | Admitting: Nurse Practitioner

## 2016-08-15 ENCOUNTER — Ambulatory Visit (INDEPENDENT_AMBULATORY_CARE_PROVIDER_SITE_OTHER): Payer: Medicare Other | Admitting: Nurse Practitioner

## 2016-08-15 VITALS — BP 141/72 | HR 66 | Wt 218.2 lb

## 2016-08-15 DIAGNOSIS — G7000 Myasthenia gravis without (acute) exacerbation: Secondary | ICD-10-CM | POA: Diagnosis not present

## 2016-08-15 MED ORDER — PYRIDOSTIGMINE BROMIDE 60 MG PO TABS: ORAL_TABLET | ORAL | 4 refills | Status: DC

## 2016-08-15 MED ORDER — ONDANSETRON HCL 4 MG PO TABS: 4.0000 mg | ORAL_TABLET | Freq: Three times a day (TID) | ORAL | 0 refills | Status: DC | PRN

## 2016-08-15 NOTE — Patient Instructions (Addendum)
Continue Mestinon 30 mg 4 times daily  Zofran for nausea  Prn  F/U in 6 months

## 2016-08-15 NOTE — Progress Notes (Signed)
[facility]  PATIENT: [name] DOB: 1946-02-25   REASON FOR VISIT: Follow-up for ocular myasthenia HISTORY FROM: Patient    HISTORY OF PRESENT ILLNESS: Initial Consult  03/07/16 ;  [name] is a 26 year pleasant Caucasian lady whose had droopy eyelids first started in December 2016 when she stated states that "I was drooping and she will barely open it. She was seen by primary physician who thought she had partial Bell's palsy. This did not get better and subsequently in June she saw ophthalmologist who noticed that she had some infection on the lower eyelid treated with antibiotics. Since then she started that she has intermittent opening of both eyelids left more than right. There are times when arises is completely shut and on other times she can see well. In fact on inquiry she states that a couple of occasion while driving she was seeing double the dividing lines of the road for crossing over. She is also noticed that she gets fatigued and tired very easily. Initially when this first began this was thought to be related to viral infection but this does not seem to be getting better. She denies any difficulty moving her eyes, any dysphagia, weakness in her arms or legs. She has no trouble getting out, chair or climbing up and down steps. She has a good appetite and she had not lost any weight. Review of electronic medical record shows that she had elevated acetylcholine receptor antibodies on 01/15/16 with acetylcholine binding antibody of 6.16, blocking antibody 31 and modulating antibody 48 all of which are abnormal. Patient has not been tried on Mestinon on other medications for myasthenia. She denies any history of headaches, strokes, TIAs or other neurologic problems. Update 05/14/2016 : She returns for follow-up after last visit 2 months ago. She states she has noticed benefit on Mestinon and eyes do not droop as much and she is not had any episodes of diplopia. She  doesn't feel tired as much as well. However she was unable to tolerate 60 mg dose of Mestinon due to stomach cramps, nausea and is taking only 30 mg mostly 3 times a day and takes an occasional fourth dose if needed. She did undergo nerve conduction study with rapid repetitive stimulation on 04/10/16 which was negative for decremental response. The patient is reluctant to consider prednisone given her diabetes and risk of blood sugars being elevated on it UPDATE 04/19/2018CM [name], 71 year old female returns for follow-up with history of ocular myasthenia. She is currently on Mestinon sometimes taking a full pill but mostly half pills due to nausea. She is a diabetic so she does not want to take prednisone. She has had no double vision blurred vision or any drooping of her left eye. Vision today 20/50 right 20/40 left.She denies any difficulty moving her eyes, any dysphagia, weakness in her arms or legs. She has no trouble getting out, chair or climbing up and down steps.  She continues to be active. She returns for reevaluation. She is requesting something for nausea REVIEW OF SYSTEMS: Full 14 system review of systems performed and notable only for those listed, all others are neg:  Constitutional: Fatigue  Cardiovascular: neg Ear/Nose/Throat: neg  Skin: neg Eyes: neg Respiratory: neg Gastroitestinal: Nausea  Hematology/Lymphatic: neg  Endocrine: neg Musculoskeletal: History of back pain Allergy/Immunology: Environmental allergies Neurological: neg Psychiatric: neg Sleep : neg   ALLERGIES: Allergies  Allergen Reactions  . Latex Rash  . Celebrex [Celecoxib] Swelling    hives  .  Vioxx [Rofecoxib] Hives    HOME MEDICATIONS: Outpatient Medications Prior to Visit  Medication Sig Dispense Refill  . APPLE CIDER VINEGAR PO Take by mouth daily.     Marland Kitchen b complex vitamins capsule Take 1 capsule by mouth daily.     . Biotin 5000 MCG TABS Take 1 tablet by mouth daily.     .  budesonide-formoterol (SYMBICORT) 80-4.5 MCG/ACT inhaler Inhale 2 puffs into the lungs 2 (two) times daily. 1 Inhaler 0  . Cholecalciferol (VITAMIN D3) 1000 units CAPS Take by mouth.    . co-enzyme Q-10 30 MG capsule Take 30 mg by mouth 3 (three) times daily.    . [employer] NATURAL LUTEIN EYE HEALTH PO Take 1 capsule by mouth daily.     . fluticasone (FLONASE) 50 MCG/ACT nasal spray Place 2 sprays into both nostrils daily.     Marland Kitchen gabapentin (NEURONTIN) 100 MG capsule Take 100 mg by mouth 2 (two) times daily.    Marland Kitchen gabapentin (NEURONTIN) 300 MG capsule Take by mouth daily.     Marland Kitchen glipiZIDE (GLUCOTROL XL) 10 MG 24 hr tablet 2 (two) times daily.     Marland Kitchen losartan-hydrochlorothiazide (HYZAAR) 100-25 MG per tablet Take 1 tablet by mouth daily.    . metFORMIN (GLUCOPHAGE-XR) 500 MG 24 hr tablet     . Misc Natural Products (TART CHERRY ADVANCED) CAPS Take by mouth.    . Multiple Vitamins-Minerals (HAIR SKIN AND NAILS FORMULA PO) Take 1 tablet by mouth daily.     . Nutritional Supplements (EQ ESTROBLEND MENOPAUSE PO) Take by mouth daily.     Marland Kitchen omega-3 acid ethyl esters (LOVAZA) 1 G capsule Take 2 g by mouth daily.    . pregabalin (LYRICA) 50 MG capsule Take 50 mg by mouth 2 (two) times daily.     Marland Kitchen pyridostigmine (MESTINON) 60 MG tablet 1/2 TAB 3 TIMES DAILY FOR 1 WEEK THEN 4 TIMES DAILY AND AFTER 2 WKS INCREASE TO 1 TAB 4 TIMES DAILY 90 tablet 3  . rosuvastatin (CRESTOR) 10 MG tablet Take 10 mg by mouth daily. Takes 1/2    . sitaGLIPtin (JANUVIA) 100 MG tablet Take 100 mg by mouth daily.     No facility-administered medications prior to visit.     PAST MEDICAL HISTORY: Past Medical History:  Diagnosis Date  . Arthritis   . Bursitis   . Diabetes mellitus without complication ([place])   . Fibromyalgia   . GERD (gastroesophageal reflux disease)   . Hyperlipemia   . Hypertension   . Sinusitis   . Sleep apnea    uses a cpap  . Sleep apnea   . Wears glasses     PAST SURGICAL HISTORY: Past Surgical  History:  Procedure Laterality Date  . ABDOMINAL HYSTERECTOMY  1999  . BREAST SURGERY  2002   rt br bx  . CERVICAL FUSION  2007  . CHOLECYSTECTOMY  2005   lap choli  . COLONOSCOPY    . FINGER GANGLION CYST EXCISION    . SHOULDER ARTHROSCOPY WITH OPEN ROTATOR CUFF REPAIR AND DISTAL CLAVICLE ACROMINECTOMY Right 09/09/2012   Procedure: Right Shoulder Arthroscopy with distal clavicle excision and mini-open rotator cuff repair.;  Surgeon: Alta Corning, MD;  Location: [place];  Service: Orthopedics;  Laterality: Right;  . TONSILLECTOMY      FAMILY HISTORY: Family History  Problem Relation Age of Onset  . Emphysema Father   . Intracerebral hemorrhage Sister     SOCIAL HISTORY: Social History  Social History  . Marital status: Married    Spouse name: N/A  . Number of children: N/A  . Years of education: N/A   Occupational History  . Not on file.   Social History Main Topics  . Smoking status: Never Smoker  . Smokeless tobacco: Never Used  . Alcohol use No  . Drug use: No  . Sexual activity: Not on file   Other Topics Concern  . Not on file   Social History Narrative  . No narrative on file     PHYSICAL EXAM  Vitals:   08/15/16 1438  BP: (!) 141/72  Pulse: 66  Weight: 218 lb 3.2 oz (99 kg)   Body mass index is 36.31 kg/m.  Generalized: Well developed,Obese female in no acute distress  Head: normocephalic and atraumatic,. Oropharynx benign  Neck: Supple, no carotid bruits  Cardiac: Regular rate rhythm, no murmur  Musculoskeletal: No deformity   Neurological examination   Mentation: Alert oriented to time, place, history taking. Attention span and concentration appropriate. Recent and remote memory intact.  Follows all commands speech and language fluent.   Cranial nerve II-XII: Pupils were equal round reactive to light extraocular movements were full, no nystagmus, with sustained upgaze she has left ptosis but no diplopia . Facial  sensation and strength were normal. hearing was intact to finger rubbing bilaterally. Uvula tongue midline. head turning and shoulder shrug were normal and symmetric.Tongue protrusion into cheek strength was normal. Motor: normal bulk and tone, full strength in the BUE, BLE, except decreased weakness distally right  lower extremity, AFO right lower leg fine finger movements normal,  Sensory: normal and symmetric to light touch, pinprick, and  Vibration, in the upper and lower extremities  Coordination: finger-nose-finger, heel-to-shin bilaterally, no dysmetria Reflexes: 1+ upper lower and symmetric, plantar responses were flexor bilaterally. Gait and Station: Rising up from seated position without assistance, normal stance,  moderate stride, good arm swing, smooth turning, able to perform tiptoe, and heel walking without difficulty. Tandem gait is unsteady. 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 She has positive acetylcholine receptor antibodies. She has been unable to tolerate 60 mg Mestinon 3 times a day due to nausea.The patient is a current patient of Dr. Leonie Man  who is out of the office today . This note is sent to the work in doctor.     Continue Mestinon 30 mg 4 times daily .  We discussed the role of prednisone but given her diabetes she is reluctant to try that Zofran for nausea prn as needed Follow up in 6 months Patient made aware to call for any increased weakness, double vision or swallowing problems. Having trouble breathing is most dangerous symptom of myasthenia gravis. Take your medications as directed. Get plenty of rest for  conservative of energy. Healthy diet and weight. Call if your visual symptoms get worse I spent 25 min  in total face to face time with the patient more than 50% of which was spent  counseling and coordination of care, reviewing test results reviewing medications and discussing and reviewing the diagnosis of ocular myasthenia gravis and further treatment options. , Rayburn Ma, [facility], APRN  [facility] [address] [place], [place] [address] [phone]

## 2016-09-05 NOTE — Progress Notes (Signed)
Personally  participated in, made any corrections needed, and agree with history, physical, neuro exam,assessment and plan as stated above.    Parisa Pinela, MD [facility] 

## 2016-11-04 ENCOUNTER — Other Ambulatory Visit: Payer: Self-pay | Admitting: Neurology

## 2016-11-29 ENCOUNTER — Ambulatory Visit (INDEPENDENT_AMBULATORY_CARE_PROVIDER_SITE_OTHER)
Admission: RE | Admit: 2016-11-29 | Discharge: 2016-11-29 | Disposition: A | Discharge status: Home / Self Care | Payer: Medicare Other | Source: Ambulatory Visit | Attending: Internal Medicine | Admitting: Internal Medicine

## 2016-11-29 ENCOUNTER — Encounter (INDEPENDENT_AMBULATORY_CARE_PROVIDER_SITE_OTHER): Payer: Self-pay

## 2016-11-29 DIAGNOSIS — J849 Interstitial pulmonary disease, unspecified: Secondary | ICD-10-CM | POA: Diagnosis not present

## 2016-11-29 DIAGNOSIS — R0689 Other abnormalities of breathing: Secondary | ICD-10-CM | POA: Diagnosis not present

## 2016-11-29 DIAGNOSIS — R0600 Dyspnea, unspecified: Secondary | ICD-10-CM

## 2016-12-03 ENCOUNTER — Encounter: Payer: Self-pay | Admitting: Internal Medicine

## 2016-12-03 ENCOUNTER — Ambulatory Visit (INDEPENDENT_AMBULATORY_CARE_PROVIDER_SITE_OTHER): Payer: Medicare Other | Admitting: Internal Medicine

## 2016-12-03 VITALS — BP 122/74 | HR 79 | Ht 63.0 in | Wt 211.0 lb

## 2016-12-03 DIAGNOSIS — R0689 Other abnormalities of breathing: Secondary | ICD-10-CM

## 2016-12-03 DIAGNOSIS — I2510 Atherosclerotic heart disease of native coronary artery without angina pectoris: Secondary | ICD-10-CM | POA: Diagnosis not present

## 2016-12-03 DIAGNOSIS — J849 Interstitial pulmonary disease, unspecified: Secondary | ICD-10-CM

## 2016-12-03 DIAGNOSIS — R7681 Abnormal rheumatoid factor and anti-citrullinated protein antibody without rheumatoid arthritis: Secondary | ICD-10-CM

## 2016-12-03 DIAGNOSIS — R768 Other specified abnormal immunological findings in serum: Secondary | ICD-10-CM

## 2016-12-03 DIAGNOSIS — R0600 Dyspnea, unspecified: Secondary | ICD-10-CM

## 2016-12-03 LAB — PULMONARY FUNCTION TEST
DL/VA % pred: 98 %
DL/VA: 4.61 ml/min/mmHg/L
DLCO COR % PRED: 80 %
DLCO COR: 18.34 ml/min/mmHg
DLCO UNC % PRED: 74 %
DLCO UNC: 17.17 ml/min/mmHg
FEF 25-75 POST: 3.49 L/s
FEF 25-75 PRE: 3.97 L/s
FEF2575-%Change-Post: -12 %
FEF2575-%PRED-POST: 189 %
FEF2575-%PRED-PRE: 215 %
FEV1-%Change-Post: -1 %
FEV1-%PRED-POST: 108 %
FEV1-%Pred-Pre: 110 %
FEV1-PRE: 2.39 L
FEV1-Post: 2.35 L
FEV1FVC-%Change-Post: 4 %
FEV1FVC-%PRED-PRE: 118 %
FEV6-%Change-Post: -6 %
FEV6-%PRED-POST: 90 %
FEV6-%Pred-Pre: 97 %
FEV6-POST: 2.49 L
FEV6-Pre: 2.66 L
FEV6FVC-%PRED-POST: 105 %
FEV6FVC-%Pred-Pre: 105 %
FVC-%Change-Post: -6 %
FVC-%Pred-Post: 86 %
FVC-%Pred-Pre: 92 %
FVC-Post: 2.49 L
FVC-Pre: 2.66 L
PRE FEV1/FVC RATIO: 90 %
Post FEV1/FVC ratio: 95 %
Post FEV6/FVC ratio: 100 %
Pre FEV6/FVC Ratio: 100 %
RV % pred: 79 %
RV: 1.69 L
TLC % pred: 90 %
TLC: 4.43 L

## 2016-12-03 NOTE — Progress Notes (Signed)
[id number] ! Subjective:     Patient ID: [name], female   DOB: [birth year], 71 y.o.   MRN: [id number]  HPI    IOV 06/04/2016  Chief Complaint  Patient presents with  . Advice Only    referred by Dr. Estanislado Pandy for ILD, abn ct.     71 year old female referred by Dr. Keturah Barre in the rheumatology clinic for evaluation of dyspnea in the setting of positive CCP and CT chest suggestive of ILD  History is taken from her and review of the outside chart of rheumatology and neurology January 2018  She is significantly obese although this is been stable for the last several years. Several years ago she developed knee issues and is wearing a brace/shoulder and her right lower extremity. Since then she's been less mobile and less active. Corresponding with this she's had insidious onset of dyspnea on exertion for climbing stairs and other activities that are class II-III nature. This dyspnea has been stable all along without any associated weight change. It is present with exertion relieved by rest. Is known orthopnea paroxysmal nocturnal dyspnea. She's had occasional associated dry cough but this is not consistent in the same with wheezing which is not consistent. There is no associated chest pain.  According to her history she's been seeing Dr. 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 According to her and review of the chart this is localized only to the ocular muscles she is on pyridostigmine. At this time a high-resolution CT chest was done 04/27/2016 that I personally visualized that shows coronary artery calcification which is unaware of and also mild subtle ILD changes. She subsequently saw Dr. Blake Divine January 2018 and autoimmune profile showed elevated CCP that was isolated but no clinical or systemic evidence of rheumatoid arthritis. The diagnoses on the joints is still osteoarthritis. Therefore she's been  referred here. There is no associated chest pain     review of the chart shows she's not had an echocardiogram a cardiac stress test btu she tells me she has one at [facility] with her cardioloigist 2 years ago was normal  Walking desaturation test 185 feet 3 laps on room air: Did not desaturate.  Results for [name], [name] (MRN [id number]) as of 06/04/2016 15:47  Ref. Range 01/15/2016 13:00  Acetylchol Block Ab Latest Ref Range: 0 - 25 % 31 (H)  Acety choline binding ab Latest Ref Range: 0.00 - 0.24 nmol/L 6.16 (H)  Acetylcholine Modulat Ab Latest Ref Range: 0 - 20 % 48 (H)   Results for [name], [name] (MRN [id number]) as of 06/04/2016 15:47  Ref. Range 05/02/2016 10:00  CK Total Latest Ref Range: 7 - 177 U/L 99  Sed Rate Latest Ref Range: 0 - 30 mm/hr 4  Anit Nuclear Antibody(ANA) Latest Ref Range: NEGATIVE  NEG  Angiotensin-Converting Enzyme Latest Ref Range: 9 - 67 U/L 34  Cyclic Citrullin Peptide Ab Latest Units: Units 109 (H)  ds DNA Ab Latest Units: IU/mL <1  Myeloperoxidase Abs Latest Ref Range: <1.0 AI  <1.0  Serine Protease 3 Latest Ref Range: <1.0 AI  <1.0  RA Latex Turbid. Latest Ref Range: <14 IU/mL <14  ENA SM Ab Ser-aCnc Latest Ref Range: <1.0 NEG AI  <1.0 NEG  Ribonucleic Protein(ENA) Antibody, IgG Latest Ref Range: <1.0 NEG AI  <1.0 NEG  SSA (Ro) (ENA) Antibody, IgG Latest Ref Range: <1.0 NEG AI  <1.0 NEG  SSB (La) (ENA) Antibody, IgG Latest Ref Range: <1.0 NEG AI  <1.0 NEG  Scleroderma (Scl-70) (ENA) Antibody, IgG Latest Ref Range: <1.0 NEG AI  <1.0 NEG      OV 12/03/2016  Chief Complaint  Patient presents with  . Follow-up    Pt states her breathing is unchanged since last OV. Pt denies significant cough, CP/tightness, f/c/s.    Follow-up shortness of breath that is multifactorial but associated with interstitial lung disease on CT scan of the chest December 2017 associated with positive CCP antibody and a diagnosis of osteoarthritis but not rheumatoid arthritis in the  joints there is associated nocturnal myasthenia.  She continues to be stable. There are no interim issues no chest pain. She only has mild shortness of breath with exertion. Her ocular myasthenia is improved. Pulmonary function test shows mild reduction in isolated diffusion capacity 74%. Forced vital capacity and lung volumes are normal. High-resolution CT chest shows subtle ILD in the bases non-UIP pattern.  CT chest high resolution IMPRESSION: 1. 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 2. Aortic atherosclerosis, in addition to left main and 3 vessel coronary artery disease. Please note that although the presence of coronary artery calcium documents the presence of coronary artery disease, the severity of this disease and any potential stenosis cannot be assessed on this non-gated CT examination. Assessment for potential risk factor modification, dietary therapy or pharmacologic therapy may be warranted, if clinically indicated. 3. Mild hepatic steatosis. 4. Additional incidental findings, as above.  Aortic Atherosclerosis (ICD10-I70.0).   Electronically Signed   By: Vinnie Langton M.D.   On: 11/29/2016 10:15     has a past medical history of Arthritis; Bursitis; Diabetes mellitus without complication ([place]); Fibromyalgia; GERD (gastroesophageal reflux disease); Hyperlipemia; Hypertension; Sinusitis; Sleep apnea; Sleep apnea; and Wears glasses.   reports that she has never smoked. She has never used smokeless tobacco.  Past Surgical History:  Procedure Laterality Date  . ABDOMINAL HYSTERECTOMY  1999  . BREAST SURGERY  2002   rt br bx  . CERVICAL FUSION  2007  . CHOLECYSTECTOMY  2005   lap choli  . COLONOSCOPY    . FINGER GANGLION CYST EXCISION    . SHOULDER ARTHROSCOPY WITH OPEN ROTATOR CUFF REPAIR AND DISTAL CLAVICLE ACROMINECTOMY Right 09/09/2012    Procedure: Right Shoulder Arthroscopy with distal clavicle excision and mini-open rotator cuff repair.;  Surgeon: Alta Corning, MD;  Location: [place];  Service: Orthopedics;  Laterality: Right;  . TONSILLECTOMY      Allergies  Allergen Reactions  . Latex Rash  . Celebrex [Celecoxib] Swelling    hives  . Vioxx [Rofecoxib] Hives    Immunization History  Administered Date(s) Administered  . Influenza Split 02/02/2016  . Influenza, High Dose Seasonal PF 03/08/2015  . Pneumococcal Conjugate-13 06/01/2015  . Pneumococcal Polysaccharide-23 01/28/2012  . Pneumococcal-Unspecified 06/04/2013  . Tdap 04/29/2009    Family History  Problem Relation Age of Onset  . Emphysema Father   . Intracerebral hemorrhage Sister      Current Outpatient Prescriptions:  .  APPLE CIDER VINEGAR PO, Take by mouth daily. , Disp: , Rfl:  .  aspirin EC 81 MG tablet, Take 81 mg by mouth daily., Disp: , Rfl:  .  Cholecalciferol (VITAMIN D3) 1000 units CAPS, Take by mouth., Disp: , Rfl:  .  co-enzyme Q-10 30 MG capsule, Take 30  mg by mouth 3 (three) times daily., Disp: , Rfl:  .  [employer] NATURAL LUTEIN EYE HEALTH PO, Take 1 capsule by mouth daily. , Disp: , Rfl:  .  fluticasone (FLONASE) 50 MCG/ACT nasal spray, Place 2 sprays into both nostrils daily. , Disp: , Rfl:  .  gabapentin (NEURONTIN) 100 MG capsule, Take 100 mg by mouth 2 (two) times daily., Disp: , Rfl:  .  gabapentin (NEURONTIN) 300 MG capsule, Take by mouth daily. , Disp: , Rfl:  .  glipiZIDE (GLUCOTROL XL) 10 MG 24 hr tablet, 2 (two) times daily. , Disp: , Rfl:  .  losartan-hydrochlorothiazide (HYZAAR) 100-25 MG per tablet, Take 1 tablet by mouth daily., Disp: , Rfl:  .  metFORMIN (GLUCOPHAGE-XR) 500 MG 24 hr tablet, , Disp: , Rfl:  .  Misc Natural Products (TART CHERRY ADVANCED) CAPS, Take by mouth., Disp: , Rfl:  .  omega-3 acid ethyl esters (LOVAZA) 1 G capsule, Take 2 g by mouth daily., Disp: , Rfl:  .  pregabalin (LYRICA)  50 MG capsule, Take 50 mg by mouth 2 (two) times daily. , Disp: , Rfl:  .  pyridostigmine (MESTINON) 60 MG tablet, 1/2 TAB 4 TIMES DAILY, Disp: 90 tablet, Rfl: 4 .  rosuvastatin (CRESTOR) 10 MG tablet, Take 10 mg by mouth daily. Takes 1/2, Disp: , Rfl:  .  sitaGLIPtin (JANUVIA) 100 MG tablet, Take 100 mg by mouth daily., Disp: , Rfl:  .  TURMERIC PO, Take by mouth., Disp: , Rfl:     Review of Systems     Objective:   Physical Exam Vitals:   12/03/16 1103  BP: 122/74  Pulse: 79  SpO2: 98%  Weight: 211 lb (95.7 kg)  Height: 5\' 3"  (1.6 m)    Estimated body mass index is 37.38 kg/m as calculated from the following:   Height as of this encounter: 5\' 3"  (1.6 m).   Weight as of this encounter: 211 lb (95.7 kg).     Assessment:       ICD-10-CM   1. ILD (interstitial lung disease) ([place]) J84.9   2. Positive anti-CCP test R76.8   3. Coronary artery calcification seen on CAT scan I25.10    Obese Discussion nly visit    Plan:     -  ILD (interstitial lung disease) (HCC) Positive anti-CCP test  - At this point in time we have mild interstitial lung disease that is stable in the last 6 months. - In my opinion this interstitial lung disease is likely related to CCP antibody positive and therefore rheumatoid related - Because it is been stable for the last 6 months and it is only of mild variety and the specific variety on scan is not UIP, I think the likelihood that this will get worse over the next 1 year is very small - Therefore on her risk-benefit ratio basis I do not recommend prednisone or CellCept/Imuran for you at this point in time - Continued observation with pulmonary function testing is the best approach  Coronary artery calcification seen on CAT scan  -  does have coronary artery calcification and if no normal cardiac stress test past few years; please refer to cardiologist - [facility] or Dr Einar Gip, first available  Follow-up - In 6 months please do Pre-bd spiro and dlco  only. No lung volume or bd response. No post-bd spiro - Return to see Dr. Chase Caller in 6 months > 50% of this > 25 min visit spent in face to face counseling  or coordination of care   Dr. Brand Males, M.D., [facility].C.P Pulmonary and Critical Care Medicine Staff Physician [facility] Pager: [phone], If no answer or between  15:00h - 7:00h: call [phone]  12/03/2016 11:46 AM

## 2016-12-03 NOTE — Progress Notes (Signed)
PFT done today. 

## 2016-12-03 NOTE — Patient Instructions (Signed)
ICD-10-CM   1. ILD (interstitial lung disease) ([place]) J84.9   2. Positive anti-CCP test R76.8   3. Coronary artery calcification seen on CAT scan I25.10    ILD (interstitial lung disease) (HCC) Positive anti-CCP test  - At this point in time we have mild interstitial lung disease that is stable in the last 6 months. - In my opinion this interstitial lung disease is likely related to CCP antibody positive and therefore rheumatoid related - Because it is been stable for the last 6 months and it is only of mild variety and the specific variety on scan is not UIP, I think the likelihood that this will get worse over the next 1 year is very small - Therefore on her risk-benefit ratio basis I do not recommend prednisone or CellCept/Imuran for you at this point in time - Continued observation with pulmonary function testing is the best approach  Coronary artery calcification seen on CAT scan  -  does have coronary artery calcification and if no normal cardiac stress test past few years; please refer to cardiologist - [facility] or Dr Einar Gip, first available  Follow-up - In 6 months please do Pre-bd spiro and dlco only. No lung volume or bd response. No post-bd spiro - Return to see Dr. Chase Caller in 6 months

## 2016-12-15 ENCOUNTER — Other Ambulatory Visit: Payer: Self-pay | Admitting: Neurology

## 2016-12-16 ENCOUNTER — Other Ambulatory Visit: Payer: Self-pay

## 2016-12-16 MED ORDER — PYRIDOSTIGMINE BROMIDE 60 MG PO TABS: ORAL_TABLET | ORAL | 4 refills | Status: DC

## 2017-01-16 NOTE — Progress Notes (Deleted)
Office Visit Note  Patient: [name]             Date of Birth: [birth year]           MRN: [id number]             PCP: Dione Housekeeper, MD Referring: Dione Housekeeper, MD Visit Date: 01/30/2017 Occupation: @GUAROCC @    Subjective:  No chief complaint on file.   History of Present Illness: [name] is a 71 y.o. female ***   Activities of Daily Living:  Patient reports morning stiffness for *** {minute/hour:19697}.   Patient {ACTIONS;DENIES/REPORTS:21021675::"Denies"} nocturnal pain.  Difficulty dressing/grooming: {ACTIONS;DENIES/REPORTS:21021675::"Denies"} Difficulty climbing stairs: {ACTIONS;DENIES/REPORTS:21021675::"Denies"} Difficulty getting out of chair: {ACTIONS;DENIES/REPORTS:21021675::"Denies"} Difficulty using hands for taps, buttons, cutlery, and/or writing: {ACTIONS;DENIES/REPORTS:21021675::"Denies"}   No Rheumatology ROS completed.   PMFS History:  Patient Active Problem List   Diagnosis Date Noted  . Positive anti-CCP test 12/03/2016  . Coronary artery calcification seen on CAT scan 12/03/2016  . ILD (interstitial lung disease) ([place]) 06/04/2016  . Dyspnea and respiratory abnormality 06/04/2016  . Primary osteoarthritis of both hands 05/01/2016  . Primary osteoarthritis of both knees 05/01/2016  . DDD (degenerative disc disease), lumbar 05/01/2016  . Trochanteric bursitis of both hips 05/01/2016  . 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 History of sleep apnea 05/01/2016  . History of hypertension 05/01/2016  . 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 Dyslipidemia 05/01/2016  . 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 Arthritis   . Bursitis   . Diabetes mellitus without complication ([place])   . Fibromyalgia   . GERD (gastroesophageal reflux disease)   . Hyperlipemia   . Hypertension   . Sinusitis   . Sleep apnea    uses a cpap  . Sleep apnea   . Wears glasses     Family History  Problem  Relation Age of Onset  . Emphysema Father   . Intracerebral hemorrhage Sister    Past Surgical History:  Procedure Laterality Date  . ABDOMINAL HYSTERECTOMY  1999  . BREAST SURGERY  2002   rt br bx  . CERVICAL FUSION  2007  . CHOLECYSTECTOMY  2005   lap choli  . COLONOSCOPY    . FINGER GANGLION CYST EXCISION    . SHOULDER ARTHROSCOPY WITH OPEN ROTATOR CUFF REPAIR AND DISTAL CLAVICLE ACROMINECTOMY Right 09/09/2012   Procedure: Right Shoulder Arthroscopy with distal clavicle excision and mini-open rotator cuff repair.;  Surgeon: Alta Corning, MD;  Location: [place];  Service: Orthopedics;  Laterality: Right;  . TONSILLECTOMY     Social History   Social History Narrative  . No narrative on file     Objective: Vital Signs: There were no vitals taken for this visit.   Physical Exam   Musculoskeletal Exam: ***  CDAI Exam: No CDAI exam completed.    Investigation: No additional findings.  Imaging: No results found.  Speciality Comments: No specialty comments available.    Procedures:  No procedures performed Allergies: Latex; Celebrex [celecoxib]; and Vioxx [rofecoxib]   Assessment / Plan:     Visit Diagnoses: Primary osteoarthritis of both hands  Positive anti-CCP test  Trochanteric bursitis of both hips  DJD C-spine  DDD (degenerative disc disease), lumbar  ILD (interstitial lung disease) ([place])  History of sleep apnea  History of hypertension  History of coronary artery calcification     Orders: No orders of the defined types were placed in this encounter.  No orders of the defined types were placed in this encounter.   Face-to-face time spent with patient was *** minutes. 50% of time was spent in counseling and coordination of care.  Follow-Up Instructions: No Follow-up on file.   Undray Allman, RT  Note - This record has been created using [employer].  Chart creation errors have been sought, but may not always    have been located. Such creation errors do not reflect on  the standard of medical care.

## 2017-01-28 NOTE — Progress Notes (Signed)
Office Visit Note  Patient: [name]             Date of Birth: 04-29-1946           MRN: [id number]             PCP: Dione Housekeeper, MD Referring: Dione Housekeeper, MD Visit Date: 01/30/2017 Occupation: @GUAROCC @    Subjective:  Pain in hands and knees.   History of Present Illness: [name] is a 71 y.o. female with history of osteoarthritis and disc disease. She continues to have some discomfort in her joints especially her hands and knee joints. She also has interstitial lung disease which is mild and stable. She's been seeing Dr. Chase Caller for that. She has positive anti-CCP antibody but no clinical features of rheumatoid arthritis on examination. At this point she is not taking any DMARD's. She states she had a rash on her left lower extremity for which she was seen by dermatologist yesterday and was given some topical agent. She's not certain about the diagnosis. She also had follow-up with the ophthalmologist regarding ocular myasthenia.  Activities of Daily Living:  Patient reports morning stiffness for 30 minutes.   Patient Reports nocturnal pain.  Difficulty dressing/grooming: Denies Difficulty climbing stairs: Reports Difficulty getting out of chair: Reports Difficulty using hands for taps, buttons, cutlery, and/or writing: Reports   Review of Systems  Constitutional: Positive for fatigue. Negative for night sweats, weight gain, weight loss and weakness.  HENT: Positive for mouth dryness. Negative for mouth sores, trouble swallowing, trouble swallowing and nose dryness.   Eyes: Positive for dryness. Negative for pain, redness and visual disturbance.  Respiratory: Positive for shortness of breath. Negative for cough and difficulty breathing.   Cardiovascular: Negative for chest pain, palpitations, hypertension, irregular heartbeat and swelling in legs/feet.  Gastrointestinal: Negative for blood in stool, constipation and diarrhea.  Endocrine: Negative for  increased urination.  Genitourinary: Negative for vaginal dryness.  Musculoskeletal: Positive for arthralgias, joint pain, morning stiffness and muscle tenderness. Negative for joint swelling, myalgias, muscle weakness and myalgias.  Skin: Negative for color change, rash, hair loss, skin tightness, ulcers and sensitivity to sunlight.  Allergic/Immunologic: Negative for susceptible to infections.  Neurological: Negative for dizziness, numbness, headaches, memory loss and night sweats.  Hematological: Negative for swollen glands.  Psychiatric/Behavioral: Negative.  Negative for depressed mood and sleep disturbance. The patient is not nervous/anxious.     PMFS History:  Patient Active Problem List   Diagnosis Date Noted  . Positive anti-CCP test 12/03/2016  . Coronary artery calcification seen on CAT scan 12/03/2016  . ILD (interstitial lung disease) ([place]) 06/04/2016  . Dyspnea and respiratory abnormality 06/04/2016  . Primary osteoarthritis of both hands 05/01/2016  . Primary osteoarthritis of both knees 05/01/2016  . DDD (degenerative disc disease), lumbar 05/01/2016  . Trochanteric bursitis of both hips 05/01/2016  . 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 History of sleep apnea 05/01/2016  . History of hypertension 05/01/2016  . 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 Dyslipidemia 05/01/2016  . 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 Arthritis   . Bursitis   . Diabetes mellitus without complication ([place])   . Fibromyalgia   . GERD (gastroesophageal reflux disease)   . Hyperlipemia   . Hypertension   . Sinusitis   . Sleep apnea    uses a cpap  . Sleep apnea   . Wears glasses     Family History  Problem Relation Age of Onset  . Emphysema Father   . Intracerebral hemorrhage Sister    Past Surgical History:  Procedure Laterality Date  . ABDOMINAL HYSTERECTOMY  1999  . BREAST SURGERY  2002   rt br bx  . CERVICAL FUSION  2007  .  CHOLECYSTECTOMY  2005   lap choli  . COLONOSCOPY    . FINGER GANGLION CYST EXCISION    . SHOULDER ARTHROSCOPY WITH OPEN ROTATOR CUFF REPAIR AND DISTAL CLAVICLE ACROMINECTOMY Right 09/09/2012   Procedure: Right Shoulder Arthroscopy with distal clavicle excision and mini-open rotator cuff repair.;  Surgeon: Alta Corning, MD;  Location: [place];  Service: Orthopedics;  Laterality: Right;  . TONSILLECTOMY     Social History   Social History Narrative  . No narrative on file     Objective: Vital Signs: BP 133/63 (BP Location: Left Arm, Patient Position: Sitting, Cuff Size: Normal)   Pulse 64   Ht 5\' 5"  (1.651 m)   Wt 213 lb (96.6 kg)   BMI 35.45 kg/m    Physical Exam  Constitutional: She is oriented to person, place, and time. She appears well-developed and well-nourished.  HENT:  Head: Normocephalic and atraumatic.  Eyes: Conjunctivae and EOM are normal.  Neck: Normal range of motion.  Cardiovascular: Normal rate, regular rhythm, normal heart sounds and intact distal pulses.   Pulmonary/Chest: Effort normal and breath sounds normal.  Abdominal: Soft. Bowel sounds are normal.  Lymphadenopathy:    She has no cervical adenopathy.  Neurological: She is alert and oriented to person, place, and time.  Skin: Skin is warm and dry. Capillary refill takes less than 2 seconds.  Psychiatric: She has a normal mood and affect. Her behavior is normal.  Nursing note and vitals reviewed.    Musculoskeletal Exam: She has some limitation with range of motion of her C-spine and thoracic and lumbar spine. Not much discomfort. Shoulder joints elbow joints wrist joint MCPs were in good range of motion with no synovitis. She has DIP PIP thickening with subluxation of some of the DIP joints. Hip joints knee joints ankles MTPs PIPs were good range of motion with no synovitis.  CDAI Exam: No CDAI exam completed.    Investigation: No additional findings.   Imaging: No results  found.  Speciality Comments: No specialty comments available.    Procedures:  No procedures performed Allergies: Latex; Celebrex [celecoxib]; and Vioxx [rofecoxib]   Assessment / Plan:     Visit Diagnoses: ILD (interstitial lung disease) ([place]) - Anti-CCP antibody 109, RF negative, ANA negative, ENA negative, pan ANCA negative, mild and stable disease followed up by Dr. Chase Caller. Have advised her to contact me in case she develops increased pain and swelling in her joints otherwise I will see her back in one year.  Primary osteoarthritis of both hands: She continues to have some discomfort in her hands due to underlying osteoarthritis and has DIP subluxation. The ring splints have been helpful .  Primary osteoarthritis of both knees: She continues to have some chronic pain and discomfort in her knee joints.  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)    Orders: No orders of the defined types were placed in this encounter.  No orders of the defined types were placed in this  encounter.     Follow-Up Instructions: Return in about 1 year (around 01/30/2018) for OA , ILD, DDD.   Bo Merino, MD  Note - This record has been created using [employer].  Chart creation errors have been sought, but may not always  have been located. Such creation errors do not reflect on  the standard of medical care.

## 2017-01-30 ENCOUNTER — Encounter: Payer: Self-pay | Admitting: Rheumatology

## 2017-01-30 ENCOUNTER — Ambulatory Visit (INDEPENDENT_AMBULATORY_CARE_PROVIDER_SITE_OTHER): Payer: Medicare Other | Admitting: Rheumatology

## 2017-01-30 ENCOUNTER — Ambulatory Visit: Payer: Medicare Other | Admitting: Rheumatology

## 2017-01-30 VITALS — BP 133/63 | HR 64 | Ht 65.0 in | Wt 213.0 lb

## 2017-01-30 DIAGNOSIS — M19042 Primary osteoarthritis, left hand: Secondary | ICD-10-CM

## 2017-01-30 DIAGNOSIS — M170 Bilateral primary osteoarthritis of knee: Secondary | ICD-10-CM | POA: Diagnosis not present

## 2017-01-30 DIAGNOSIS — G7000 Myasthenia gravis without (acute) exacerbation: Secondary | ICD-10-CM

## 2017-01-30 DIAGNOSIS — M5030 Other cervical disc degeneration, unspecified cervical region: Secondary | ICD-10-CM

## 2017-01-30 DIAGNOSIS — E785 Hyperlipidemia, unspecified: Secondary | ICD-10-CM

## 2017-01-30 DIAGNOSIS — M5136 Other intervertebral disc degeneration, lumbar region: Secondary | ICD-10-CM

## 2017-01-30 DIAGNOSIS — J849 Interstitial pulmonary disease, unspecified: Secondary | ICD-10-CM | POA: Diagnosis not present

## 2017-01-30 DIAGNOSIS — M19041 Primary osteoarthritis, right hand: Secondary | ICD-10-CM

## 2017-01-30 DIAGNOSIS — Z8679 Personal history of other diseases of the circulatory system: Secondary | ICD-10-CM

## 2017-01-30 DIAGNOSIS — I2510 Atherosclerotic heart disease of native coronary artery without angina pectoris: Secondary | ICD-10-CM

## 2017-02-09 NOTE — Progress Notes (Deleted)
[facility]  PATIENT: EDITA WEYENBERG DOB: 1946-02-25   REASON FOR VISIT: Follow-up for ocular myasthenia HISTORY FROM: Patient    HISTORY OF PRESENT ILLNESS: Initial Consult  03/07/16 ;  [name] is a 26 year pleasant Caucasian lady whose had droopy eyelids first started in December 2016 when she stated states that "I was drooping and she will barely open it. She was seen by primary physician who thought she had partial Bell's palsy. This did not get better and subsequently in June she saw ophthalmologist who noticed that she had some infection on the lower eyelid treated with antibiotics. Since then she started that she has intermittent opening of both eyelids left more than right. There are times when arises is completely shut and on other times she can see well. In fact on inquiry she states that a couple of occasion while driving she was seeing double the dividing lines of the road for crossing over. She is also noticed that she gets fatigued and tired very easily. Initially when this first began this was thought to be related to viral infection but this does not seem to be getting better. She denies any difficulty moving her eyes, any dysphagia, weakness in her arms or legs. She has no trouble getting out, chair or climbing up and down steps. She has a good appetite and she had not lost any weight. Review of electronic medical record shows that she had elevated acetylcholine receptor antibodies on 01/15/16 with acetylcholine binding antibody of 6.16, blocking antibody 31 and modulating antibody 48 all of which are abnormal. Patient has not been tried on Mestinon on other medications for myasthenia. She denies any history of headaches, strokes, TIAs or other neurologic problems. Update 05/14/2016 : She returns for follow-up after last visit 2 months ago. She states she has noticed benefit on Mestinon and eyes do not droop as much and she is not had any episodes of diplopia. She  doesn't feel tired as much as well. However she was unable to tolerate 60 mg dose of Mestinon due to stomach cramps, nausea and is taking only 30 mg mostly 3 times a day and takes an occasional fourth dose if needed. She did undergo nerve conduction study with rapid repetitive stimulation on 04/10/16 which was negative for decremental response. The patient is reluctant to consider prednisone given her diabetes and risk of blood sugars being elevated on it UPDATE 04/19/2018CM [name], 71 year old female returns for follow-up with history of ocular myasthenia. She is currently on Mestinon sometimes taking a full pill but mostly half pills due to nausea. She is a diabetic so she does not want to take prednisone. She has had no double vision blurred vision or any drooping of her left eye. Vision today 20/50 right 20/40 left.She denies any difficulty moving her eyes, any dysphagia, weakness in her arms or legs. She has no trouble getting out, chair or climbing up and down steps.  She continues to be active. She returns for reevaluation. She is requesting something for nausea REVIEW OF SYSTEMS: Full 14 system review of systems performed and notable only for those listed, all others are neg:  Constitutional: Fatigue  Cardiovascular: neg Ear/Nose/Throat: neg  Skin: neg Eyes: neg Respiratory: neg Gastroitestinal: Nausea  Hematology/Lymphatic: neg  Endocrine: neg Musculoskeletal: History of back pain Allergy/Immunology: Environmental allergies Neurological: neg Psychiatric: neg Sleep : neg   ALLERGIES: Allergies  Allergen Reactions  . Latex Rash  . Celebrex [Celecoxib] Swelling    hives  .  Vioxx [Rofecoxib] Hives    HOME MEDICATIONS: Outpatient Medications Prior to Visit  Medication Sig Dispense Refill  . ACCU-CHEK AVIVA PLUS test strip Apply 1 strip topically daily. for testing  1  . ACCU-CHEK SOFTCLIX LANCETS lancets TEST DAILY E11.9  1  . APPLE CIDER VINEGAR PO Take by mouth daily.       Marland Kitchen aspirin EC 81 MG tablet Take 81 mg by mouth daily.    . cetirizine (ZYRTEC) 10 MG tablet Take 10 mg by mouth daily.    . cetirizine (ZYRTEC) 10 MG tablet Take 10 mg by mouth daily.  0  . Cholecalciferol (VITAMIN D3) 1000 units CAPS Take by mouth.    . co-enzyme Q-10 30 MG capsule Take 30 mg by mouth 3 (three) times daily.    . [employer] NATURAL LUTEIN EYE HEALTH PO Take 1 capsule by mouth daily.     . fluticasone (FLONASE) 50 MCG/ACT nasal spray Place 2 sprays into both nostrils daily.     . fluticasone (FLONASE) 50 MCG/ACT nasal spray Place into the nose.    . gabapentin (NEURONTIN) 100 MG capsule Take 100 mg by mouth 2 (two) times daily.    Marland Kitchen gabapentin (NEURONTIN) 300 MG capsule Take by mouth daily.     Marland Kitchen glipiZIDE (GLUCOTROL XL) 10 MG 24 hr tablet 2 (two) times daily.     Marland Kitchen glipiZIDE (GLUCOTROL XL) 10 MG 24 hr tablet TAKE 1 TABLET BY MOUTH AFTER LUNCH AND SUPPER    . hydrocortisone 2.5 % lotion Apply topically.    Marland Kitchen losartan-hydrochlorothiazide (HYZAAR) 100-25 MG per tablet Take 1 tablet by mouth daily.    Marland Kitchen losartan-hydrochlorothiazide (HYZAAR) 100-25 MG tablet TAKE (1) TABLET BY MOUTH ONCE DAILY.    . metFORMIN (GLUCOPHAGE-XR) 500 MG 24 hr tablet     . metFORMIN (GLUCOPHAGE-XR) 500 MG 24 hr tablet TAKE 2 TABLETS TWICE DAILY AS DIRECTED.    . Misc Natural Products (TART CHERRY ADVANCED) CAPS Take by mouth.    . Misc. Devices MISC Avivia Plus lancets and strips. Tests daily  DX E11.9    . omega-3 acid ethyl esters (LOVAZA) 1 G capsule Take 2 g by mouth daily.    Marland Kitchen omega-3 acid ethyl esters (LOVAZA) 1 g capsule TAKE (2) CAPSULES BY MOUTH TWICE DAILY.    Marland Kitchen pregabalin (LYRICA) 50 MG capsule Take 50 mg by mouth 2 (two) times daily.     . pregabalin (LYRICA) 50 MG capsule every morning.    . pyridostigmine (MESTINON) 60 MG tablet 1/2 TAB 3 TIMES DAILY FOR 1 WEEK THEN 4 TIMES DAILY AND AFTER 2 WKS INCREASE TO 1 TAB 4 TIMES DAILY (Patient not taking: Reported on 01/30/2017) 90 tablet 3  .  pyridostigmine (MESTINON) 60 MG tablet 1/2 TAB 4 TIMES DAILY 90 tablet 4  . pyridostigmine (MESTINON) 60 MG tablet 1/2 TAB 4 TIMES DAILY    . rosuvastatin (CRESTOR) 10 MG tablet Take 10 mg by mouth daily. Takes 1/2    . rosuvastatin (CRESTOR) 10 MG tablet TAKE (1) TABLET BY MOUTH ONCE DAILY.    . sitaGLIPtin (JANUVIA) 100 MG tablet Take 100 mg by mouth daily.    . sitaGLIPtin (JANUVIA) 100 MG tablet TAKE (1) TABLET BY MOUTH ONCE DAILY.    Marland Kitchen triamcinolone cream (KENALOG) 0.1 %     . TURMERIC PO Take by mouth.     No facility-administered medications prior to visit.     PAST MEDICAL HISTORY: Past Medical History:  Diagnosis Date  .  Arthritis   . Bursitis   . Diabetes mellitus without complication ([place])   . Fibromyalgia   . GERD (gastroesophageal reflux disease)   . Hyperlipemia   . Hypertension   . Sinusitis   . Sleep apnea    uses a cpap  . Sleep apnea   . Wears glasses     PAST SURGICAL HISTORY: Past Surgical History:  Procedure Laterality Date  . ABDOMINAL HYSTERECTOMY  1999  . BREAST SURGERY  2002   rt br bx  . CERVICAL FUSION  2007  . CHOLECYSTECTOMY  2005   lap choli  . COLONOSCOPY    . FINGER GANGLION CYST EXCISION    . SHOULDER ARTHROSCOPY WITH OPEN ROTATOR CUFF REPAIR AND DISTAL CLAVICLE ACROMINECTOMY Right 09/09/2012   Procedure: Right Shoulder Arthroscopy with distal clavicle excision and mini-open rotator cuff repair.;  Surgeon: Alta Corning, MD;  Location: [place];  Service: Orthopedics;  Laterality: Right;  . TONSILLECTOMY      FAMILY HISTORY: Family History  Problem Relation Age of Onset  . Emphysema Father   . Intracerebral hemorrhage Sister     SOCIAL HISTORY: Social History   Social History  . Marital status: Married    Spouse name: N/A  . Number of children: N/A  . Years of education: N/A   Occupational History  . Not on file.   Social History Main Topics  . Smoking status: Never Smoker  . Smokeless tobacco: Never  Used  . Alcohol use No  . Drug use: No  . Sexual activity: Not on file   Other Topics Concern  . Not on file   Social History Narrative  . No narrative on file     PHYSICAL EXAM  There were no vitals filed for this visit. There is no height or weight on file to calculate BMI.  Generalized: Well developed,Obese female in no acute distress  Head: normocephalic and atraumatic,. Oropharynx benign  Neck: Supple, no carotid bruits  Cardiac: Regular rate rhythm, no murmur  Musculoskeletal: No deformity   Neurological examination   Mentation: Alert oriented to time, place, history taking. Attention span and concentration appropriate. Recent and remote memory intact.  Follows all commands speech and language fluent.   Cranial nerve II-XII: Pupils were equal round reactive to light extraocular movements were full, no nystagmus, with sustained upgaze she has left ptosis but no diplopia . Facial sensation and strength were normal. hearing was intact to finger rubbing bilaterally. Uvula tongue midline. head turning and shoulder shrug were normal and symmetric.Tongue protrusion into cheek strength was normal. Motor: normal bulk and tone, full strength in the BUE, BLE, except decreased weakness distally right  lower extremity, AFO right lower leg fine finger movements normal,  Sensory: normal and symmetric to light touch, pinprick, and  Vibration, in the upper and lower extremities  Coordination: finger-nose-finger, heel-to-shin bilaterally, no dysmetria Reflexes: 1+ upper lower and symmetric, plantar responses were flexor bilaterally. Gait and Station: Rising up from seated position without assistance, normal stance,  moderate stride, good arm swing, smooth turning, able to perform tiptoe, and heel walking without difficulty. Tandem gait is unsteady. 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 She has positive acetylcholine receptor antibodies. She has been unable to tolerate 60 mg Mestinon 3 times a day due to nausea.The patient is a current patient  of Dr. Leonie Man  who is out of the office today . This note is sent to the work in doctor.     Continue Mestinon 30 mg 4 times daily .  We discussed the role of prednisone but given her diabetes she is reluctant to try that Zofran for nausea prn as needed Follow up in 6 months Patient made aware to call for any increased weakness, double vision or swallowing problems. Having trouble breathing is most dangerous symptom of myasthenia gravis. Take your medications as directed. Get plenty of rest for  conservative of energy. Healthy diet and weight. Call if your visual symptoms get worse I spent 25 min  in total face to face time with the patient more than 50% of which was spent counseling and coordination of care, reviewing test results reviewing medications and discussing and reviewing the diagnosis of ocular myasthenia gravis and further treatment options. , Rayburn Ma, [facility], APRN  [facility] [address], [place], [place] [address] [phone]

## 2017-02-10 ENCOUNTER — Ambulatory Visit: Payer: Medicare Other | Admitting: Nurse Practitioner

## 2017-02-11 ENCOUNTER — Encounter: Payer: Self-pay | Admitting: Nurse Practitioner

## 2017-02-12 ENCOUNTER — Encounter: Payer: Self-pay | Admitting: Nurse Practitioner

## 2017-02-12 ENCOUNTER — Ambulatory Visit (INDEPENDENT_AMBULATORY_CARE_PROVIDER_SITE_OTHER): Payer: Medicare Other | Admitting: Nurse Practitioner

## 2017-02-12 VITALS — BP 124/69 | HR 68 | Ht 66.0 in | Wt 214.0 lb

## 2017-02-12 DIAGNOSIS — Z8669 Personal history of other diseases of the nervous system and sense organs: Secondary | ICD-10-CM | POA: Diagnosis not present

## 2017-02-12 DIAGNOSIS — I2510 Atherosclerotic heart disease of native coronary artery without angina pectoris: Secondary | ICD-10-CM

## 2017-02-12 DIAGNOSIS — G7000 Myasthenia gravis without (acute) exacerbation: Secondary | ICD-10-CM | POA: Diagnosis not present

## 2017-02-12 MED ORDER — PYRIDOSTIGMINE BROMIDE 60 MG PO TABS: ORAL_TABLET | ORAL | 6 refills | Status: DC

## 2017-02-12 NOTE — Progress Notes (Signed)
I have read the note, and I agree with the clinical assessment and plan.  Shawndale Kilpatrick KEITH   

## 2017-02-12 NOTE — Progress Notes (Signed)
[facility]  PATIENT: [name] DOB: 07-17-1945   REASON FOR VISIT: Follow-up for ocular myasthenia HISTORY FROM: Patient    HISTORY OF PRESENT ILLNESS: Initial Consult  03/07/16 PS;  [name] is [name] whose had droopy eyelids first started in December 2016 when she stated states that "I was drooping and she will barely open it. She was seen by primary physician who thought she had partial Bell's palsy. This did not get better and subsequently in June she saw ophthalmologist who noticed that she had some infection on the lower eyelid treated with antibiotics. Since then she started that she has intermittent opening of both eyelids left more than right. There are times when arises is completely shut and on other times she can see well. In fact on inquiry she states that a couple of occasion while driving she was seeing double the dividing lines of the road for crossing over. She is also noticed that she gets fatigued and tired very easily. Initially when this first began this was thought to be related to viral infection but this does not seem to be getting better. She denies any difficulty moving her eyes, any dysphagia, weakness in her arms or legs. She has no trouble getting out, chair or climbing up and down steps. She has a good appetite and she had not lost any weight. Review of electronic medical record shows that she had elevated acetylcholine receptor antibodies on 01/15/16 with acetylcholine binding antibody of 6.16, blocking antibody 31 and modulating antibody 48 all of which are abnormal. Patient has not been tried on Mestinon on other medications for myasthenia. She denies any history of headaches, strokes, TIAs or other neurologic problems. Update 1/16/2018PS : She returns for follow-up after last visit 2 months ago. She states she has noticed benefit on Mestinon and eyes do not droop as much and she is not had any episodes of diplopia.  She doesn't feel tired as much as well. However she was unable to tolerate 60 mg dose of Mestinon due to stomach cramps, nausea and is taking only 30 mg mostly 3 times a day and takes an occasional fourth dose if needed. She did undergo nerve conduction study with rapid repetitive stimulation on 04/10/16 which was negative for decremental response. The patient is reluctant to consider prednisone given her diabetes and risk of blood sugars being elevated on it UPDATE 04/19/2018CM [name], 71 year old female returns for follow-up with history of ocular myasthenia. She is currently on Mestinon sometimes taking a full pill but mostly half pills due to nausea. She is a diabetic so she does not want to take prednisone. She has had no double vision blurred vision or any drooping of her left eye. Vision today 20/50 right 20/40 left.She denies any difficulty moving her eyes, any dysphagia, weakness in her arms or legs. She has no trouble getting out, chair or climbing up and down steps.  She continues to be active. She returns for reevaluation. She is requesting something for nausea UPDATE 10/17/2018CM  [name], 71 year old female returns for follow-up with a history of ocular myasthenia with symptoms beginning in June 2016. She denies any episodes of diplopia, no ptosis seen today. Vision today 2070 bilaterally. She denies any weakness in her arms or legs or any problems with breathing she continues to be quite active. She continues to drive without difficulty she had 1 recent fall no injury.  She had a recent visit to her ophthalmologist.  She is currently on Mestinon sometimes takes a full pill and sometimes a half pill. She has nausea but  Zofran did not help, she returns for reevaluation. Recently diagnosed with arthritis. REVIEW OF SYSTEMS: Full 14 system review of systems performed and notable only for those listed, all others are neg:  Constitutional: Fatigue  Cardiovascular: neg Ear/Nose/Throat: neg    Skin: neg Eyes: neg Respiratory: neg Gastroitestinal: Nausea  Hematology/Lymphatic: neg  Endocrine: neg Musculoskeletal: Joint pain Allergy/Immunology: Environmental allergies Neurological: neg Psychiatric: neg Sleep : Obstructive sleep apnea with CPAP ALLERGIES: Allergies  Allergen Reactions  . Latex Rash  . Celebrex [Celecoxib] Swelling    hives  . Vioxx [Rofecoxib] Hives    HOME MEDICATIONS: Outpatient Medications Prior to Visit  Medication Sig Dispense Refill  . ACCU-CHEK AVIVA PLUS test strip Apply 1 strip topically daily. for testing  1  . ACCU-CHEK SOFTCLIX LANCETS lancets TEST DAILY E11.9  1  . APPLE CIDER VINEGAR PO Take by mouth daily.     Marland Kitchen aspirin EC 81 MG tablet Take 81 mg by mouth daily.    . cetirizine (ZYRTEC) 10 MG tablet Take 10 mg by mouth daily.  0  . Cholecalciferol (VITAMIN D3) 1000 units CAPS Take by mouth.    . co-enzyme Q-10 30 MG capsule Take 30 mg by mouth 3 (three) times daily.    . [employer]S NATURAL LUTEIN EYE HEALTH PO Take 1 capsule by mouth daily.     . fluticasone (FLONASE) 50 MCG/ACT nasal spray Place 2 sprays into both nostrils daily.     Marland Kitchen gabapentin (NEURONTIN) 100 MG capsule Take 100 mg by mouth 2 (two) times daily.    Marland Kitchen gabapentin (NEURONTIN) 300 MG capsule Take by mouth daily.     Marland Kitchen glipiZIDE (GLUCOTROL XL) 10 MG 24 hr tablet TAKE 1 TABLET BY MOUTH AFTER LUNCH AND SUPPER    . hydrocortisone 2.5 % lotion Apply topically.    Marland Kitchen losartan-hydrochlorothiazide (HYZAAR) 100-25 MG tablet TAKE (1) TABLET BY MOUTH ONCE DAILY.    . metFORMIN (GLUCOPHAGE-XR) 500 MG 24 hr tablet TAKE 2 TABLETS TWICE DAILY AS DIRECTED.    . Misc Natural Products (TART CHERRY ADVANCED) CAPS Take by mouth.    . Misc. Devices MISC Avivia Plus lancets and strips. Tests daily  DX E11.9    . omega-3 acid ethyl esters (LOVAZA) 1 g capsule TAKE (2) CAPSULES BY MOUTH TWICE DAILY.    Marland Kitchen pregabalin (LYRICA) 50 MG capsule Take 50 mg by mouth 2 (two) times daily.     Marland Kitchen pyridostigmine  (MESTINON) 60 MG tablet 1/2 TAB 3 TIMES DAILY FOR 1 WEEK THEN 4 TIMES DAILY AND AFTER 2 WKS INCREASE TO 1 TAB 4 TIMES DAILY 90 tablet 3  . pyridostigmine (MESTINON) 60 MG tablet 1/2 TAB 4 TIMES DAILY 90 tablet 4  . rosuvastatin (CRESTOR) 10 MG tablet TAKE (1) TABLET BY MOUTH ONCE DAILY.    . sitaGLIPtin (JANUVIA) 100 MG tablet TAKE (1) TABLET BY MOUTH ONCE DAILY.    Marland Kitchen triamcinolone cream (KENALOG) 0.1 %     . TURMERIC PO Take by mouth.    . pregabalin (LYRICA) 50 MG capsule every morning.    . cetirizine (ZYRTEC) 10 MG tablet Take 10 mg by mouth daily.    . fluticasone (FLONASE) 50 MCG/ACT nasal spray Place into the nose.    Marland Kitchen glipiZIDE (GLUCOTROL XL) 10 MG 24 hr tablet 2 (two) times daily.     Marland Kitchen losartan-hydrochlorothiazide (HYZAAR) 100-25 MG per tablet Take  1 tablet by mouth daily.    . metFORMIN (GLUCOPHAGE-XR) 500 MG 24 hr tablet     . omega-3 acid ethyl esters (LOVAZA) 1 G capsule Take 2 g by mouth daily.    Marland Kitchen pyridostigmine (MESTINON) 60 MG tablet 1/2 TAB 4 TIMES DAILY    . rosuvastatin (CRESTOR) 10 MG tablet Take 10 mg by mouth daily. Takes 1/2    . sitaGLIPtin (JANUVIA) 100 MG tablet Take 100 mg by mouth daily.     No facility-administered medications prior to visit.     PAST MEDICAL HISTORY: Past Medical History:  Diagnosis Date  . Arthritis   . Bursitis   . Diabetes mellitus without complication ([place])   . Fibromyalgia   . GERD (gastroesophageal reflux disease)   . Hyperlipemia   . Hypertension   . Sinusitis   . Sleep apnea    uses a cpap  . Sleep apnea   . Wears glasses     PAST SURGICAL HISTORY: Past Surgical History:  Procedure Laterality Date  . ABDOMINAL HYSTERECTOMY  1999  . BREAST SURGERY  2002   rt br bx  . CERVICAL FUSION  2007  . CHOLECYSTECTOMY  2005   lap choli  . COLONOSCOPY    . FINGER GANGLION CYST EXCISION    . SHOULDER ARTHROSCOPY WITH OPEN ROTATOR CUFF REPAIR AND DISTAL CLAVICLE ACROMINECTOMY Right 09/09/2012   Procedure: Right Shoulder  Arthroscopy with distal clavicle excision and mini-open rotator cuff repair.;  Surgeon: Alta Corning, MD;  Location: [place];  Service: Orthopedics;  Laterality: Right;  . TONSILLECTOMY      FAMILY HISTORY: Family History  Problem Relation Age of Onset  . Emphysema Father   . Intracerebral hemorrhage Sister     SOCIAL HISTORY: Social History   Social History  . Marital status: Married    Spouse name: N/A  . Number of children: N/A  . Years of education: N/A   Occupational History  . Not on file.   Social History Main Topics  . Smoking status: Never Smoker  . Smokeless tobacco: Never Used  . Alcohol use No  . Drug use: No  . Sexual activity: Not on file   Other Topics Concern  . Not on file   Social History Narrative  . No narrative on file     PHYSICAL EXAM  Vitals:   02/12/17 1452  BP: 124/69  Pulse: 68  Weight: 214 lb (97.1 kg)  Height: 5\' 6"  (1.676 m)   Body mass index is 34.54 kg/m.  Generalized: Well developed,Obese female in no acute distress  Head: normocephalic and atraumatic,. Oropharynx benign  Neck: Supple, no carotid bruits  Cardiac: Regular rate rhythm, no murmur  Musculoskeletal: No deformity   Neurological examination   Mentation: Alert oriented to time, place, history taking. Attention span and concentration appropriate. Recent and remote memory intact.  Follows all commands speech and language fluent.   Cranial nerve II-XII: Pupils were equal round reactive to light extraocular movements were full, no nystagmus, with sustained upgaze no ptosis,  no diplopia . Facial sensation and strength were normal. hearing was intact to finger rubbing bilaterally. Uvula tongue midline. head turning and shoulder shrug were normal and symmetric.Tongue protrusion into cheek strength was normal. Motor: normal bulk and tone, full strength in the BUE, BLE, except decreased weakness distally right  lower extremity, AFO right lower leg fine  finger movements normal,  Sensory: normal and symmetric to light touch, pinprick, and  Vibration, in  the upper and lower extremities  Coordination: finger-nose-finger, heel-to-shin bilaterally, no dysmetria Reflexes: 1+ upper lower and symmetric, plantar responses were flexor bilaterally. Gait and Station: Rising up from seated position without assistance, normal stance,  moderate stride, good arm swing, smooth turning, able to perform tiptoe, and heel walking without difficulty. Tandem gait is unsteady. 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 She has positive acetylcholine receptor antibodies. She has been unable to tolerate 60 mg Mestinon 3 times a day due to nausea.The patient is a current patient of Dr. Leonie Man  who is out of the office today . This note is sent to the work in doctor.     Continue Mestinon 60 mg 1/2 tab  4 times daily .  We discussed the role of prednisone but given her diabetes she is reluctant to try that Follow up in 6 months next with Dr. Leonie Man Patient made aware to call for any increased weakness, double vision or swallowing problems. Having trouble breathing is most dangerous symptom of myasthenia gravis. Take your medications as directed. Get plenty of rest for  conservative of energy. Healthy diet and weight. Call if your visual symptoms get worse I spent 25 min  in total face to face time with the patient more than 50% of which was spent counseling and coordination of care, reviewing test results reviewing medications and discussing and reviewing the diagnosis of ocular myasthenia gravis and further treatment options. Patient was given written instructions as well. , Dennie Bible, [facility], [facility], APRN  [facility] [address] [place], [place] [address] [phone]

## 2017-02-12 NOTE — Patient Instructions (Addendum)
Continue Mestinon 60 mg 4 times daily .  We discussed the role of prednisone but given her diabetes she is reluctant to try that Follow up in 6 months next with Dr. Leonie [name]  Myasthenia Gravis Myasthenia gravis (MG) means severe weakness. It is a long-term (chronic) condition that causes weakness in the muscles you can control (voluntary muscles). MG can affect any voluntary muscle. The muscles most often affected are the ones that control:  Eye movement.  Facial movements.  Swallowing.  MG is an autoimmune disease, which means that your body's defense system (immune system) attacks healthy parts of your body instead of germs and other things that make you sick. When you have MG, your immune system makes proteins (antibodies) that block the chemical (acetylcholine) your body needs to send nerve signals to your muscles. This causes muscle weakness. What are the causes? The exact cause of MG is unknown. One possible cause is an enlarged thymus gland, which is located under your breastbone. What are the signs or symptoms? The earliest symptom of MG is muscle weakness that gets worse with activity and gets better after rest. Other symptoms of MG may include:  Drooping eyelids.  Double vision.  Loss of facial expression.  Trouble chewing and swallowing.  Slurred speech.  A waddling walk.  Weakness of the arms, hands, and legs.  Trouble breathing is the most dangerous symptom of MG. Sudden and severe difficulty breathing (myasthenic crisis) may require emergency breathing support. This symptom sometimes happens after:  Infection.  Fever.  Drug reaction.  How is this diagnosed? It can be hard to diagnose MG because muscle weakness is a common symptom in many conditions. Your health care provider will do a physical exam. You may also have tests that will help make a diagnosis. These may include:  A blood test.  A test using the medicine edrophonium. This medicine increases muscle  strength by slowing the breakdown of acetylcholine.  Tests to measure nerve conduction to muscle (electromyography).  An imaging study of the chest (CT or MRI).  How is this treated? Treatment can improve muscle strength. Sometimes symptoms of MG go away for a while (remission) and you can stop treatment. Possible treatments include:  Medicine.  Removal of the thymus gland (thymectomy). This may result in a long remission for some people.  Follow these instructions at home:  Take medicines only as directed by your health care provider.  Get plenty of rest to conserve your energy.  Take frequent breaks to rest your eyes.  Maintain a healthy diet and a healthy weight.  Do not use any tobacco products including cigarettes, chewing tobacco, or electronic cigarettes. If you need help quitting, ask your health care provider.  Keep all follow-up visits as directed by your health care provider. This is important. Contact a health care provider if:  Your symptoms get worse after a fever or infection.  You have a reaction to a medicine you are taking.  Your symptoms change or get worse. Get help right away if: You have trouble breathing. This information is not intended to replace advice given to you by your health care provider. Make sure you discuss any questions you have with your health care provider. Document Released: 07/22/2000 Document Revised: 09/21/2015 Document Reviewed: 06/16/2013 Elsevier Interactive Patient Education  [employer].

## 2017-02-13 ENCOUNTER — Ambulatory Visit: Payer: Medicare Other | Admitting: Nurse Practitioner

## 2017-05-20 DIAGNOSIS — E119 Type 2 diabetes mellitus without complications: Secondary | ICD-10-CM | POA: Diagnosis not present

## 2017-05-20 DIAGNOSIS — I10 Essential (primary) hypertension: Secondary | ICD-10-CM | POA: Diagnosis not present

## 2017-05-22 DIAGNOSIS — E1149 Type 2 diabetes mellitus with other diabetic neurological complication: Secondary | ICD-10-CM | POA: Diagnosis not present

## 2017-05-22 DIAGNOSIS — E785 Hyperlipidemia, unspecified: Secondary | ICD-10-CM | POA: Diagnosis not present

## 2017-05-22 DIAGNOSIS — J849 Interstitial pulmonary disease, unspecified: Secondary | ICD-10-CM | POA: Diagnosis not present

## 2017-05-22 DIAGNOSIS — E1140 Type 2 diabetes mellitus with diabetic neuropathy, unspecified: Secondary | ICD-10-CM | POA: Diagnosis not present

## 2017-05-22 DIAGNOSIS — G7000 Myasthenia gravis without (acute) exacerbation: Secondary | ICD-10-CM | POA: Diagnosis not present

## 2017-05-22 DIAGNOSIS — E1169 Type 2 diabetes mellitus with other specified complication: Secondary | ICD-10-CM | POA: Diagnosis not present

## 2017-05-22 DIAGNOSIS — I10 Essential (primary) hypertension: Secondary | ICD-10-CM | POA: Diagnosis not present

## 2017-05-28 DIAGNOSIS — S338XXA Sprain of other parts of lumbar spine and pelvis, initial encounter: Secondary | ICD-10-CM | POA: Diagnosis not present

## 2017-05-28 DIAGNOSIS — M47816 Spondylosis without myelopathy or radiculopathy, lumbar region: Secondary | ICD-10-CM | POA: Diagnosis not present

## 2017-05-28 DIAGNOSIS — M9903 Segmental and somatic dysfunction of lumbar region: Secondary | ICD-10-CM | POA: Diagnosis not present

## 2017-05-30 DIAGNOSIS — S338XXA Sprain of other parts of lumbar spine and pelvis, initial encounter: Secondary | ICD-10-CM | POA: Diagnosis not present

## 2017-05-30 DIAGNOSIS — M9903 Segmental and somatic dysfunction of lumbar region: Secondary | ICD-10-CM | POA: Diagnosis not present

## 2017-05-30 DIAGNOSIS — M47816 Spondylosis without myelopathy or radiculopathy, lumbar region: Secondary | ICD-10-CM | POA: Diagnosis not present

## 2017-06-02 DIAGNOSIS — M9903 Segmental and somatic dysfunction of lumbar region: Secondary | ICD-10-CM | POA: Diagnosis not present

## 2017-06-02 DIAGNOSIS — M47816 Spondylosis without myelopathy or radiculopathy, lumbar region: Secondary | ICD-10-CM | POA: Diagnosis not present

## 2017-06-02 DIAGNOSIS — S338XXA Sprain of other parts of lumbar spine and pelvis, initial encounter: Secondary | ICD-10-CM | POA: Diagnosis not present

## 2017-06-03 ENCOUNTER — Encounter: Payer: Self-pay | Admitting: Internal Medicine

## 2017-06-03 ENCOUNTER — Ambulatory Visit (INDEPENDENT_AMBULATORY_CARE_PROVIDER_SITE_OTHER): Payer: PPO | Admitting: Internal Medicine

## 2017-06-03 ENCOUNTER — Ambulatory Visit: Payer: PPO | Admitting: Internal Medicine

## 2017-06-03 VITALS — BP 118/72 | HR 74 | Ht 63.0 in | Wt 209.0 lb

## 2017-06-03 DIAGNOSIS — I2510 Atherosclerotic heart disease of native coronary artery without angina pectoris: Secondary | ICD-10-CM | POA: Diagnosis not present

## 2017-06-03 DIAGNOSIS — R768 Other specified abnormal immunological findings in serum: Secondary | ICD-10-CM

## 2017-06-03 DIAGNOSIS — J849 Interstitial pulmonary disease, unspecified: Secondary | ICD-10-CM | POA: Diagnosis not present

## 2017-06-03 LAB — PULMONARY FUNCTION TEST
DL/VA % pred: 84 %
DL/VA: 3.95 ml/min/mmHg/L
DLCO UNC % PRED: 71 %
DLCO unc: 16.35 ml/min/mmHg
FEF 25-75 PRE: 3.7 L/s
FEF2575-%Pred-Pre: 204 %
FEV1-%Pred-Pre: 110 %
FEV1-Pre: 2.38 L
FEV1FVC-%Pred-Pre: 117 %
FEV6-%PRED-PRE: 97 %
FEV6-Pre: 2.66 L
FEV6FVC-%PRED-PRE: 105 %
FVC-%Pred-Pre: 93 %
FVC-Pre: 2.66 L
PRE FEV1/FVC RATIO: 89 %
PRE FEV6/FVC RATIO: 100 %

## 2017-06-03 NOTE — Progress Notes (Signed)
Subjective:     Patient ID: [name], female   DOB: 06-16-1945, 72 y.o.   MRN: [id number]  HPI HPI    IOV 06/04/2016  Chief Complaint  Patient presents with  . Advice Only    referred by Dr. Estanislado Pandy for ILD, abn ct.     72 year old female referred by Dr. Keturah Barre in the rheumatology clinic for evaluation of dyspnea in the setting of positive CCP and CT chest suggestive of ILD  History is taken from her and review of the outside chart of rheumatology and neurology January 2018  She is significantly obese although this is been stable for the last several years. Several years ago she developed knee issues and is wearing a brace/shoulder and her right lower extremity. Since then she's been less mobile and less active. Corresponding with this she's had insidious onset of dyspnea on exertion for climbing stairs and other activities that are class II-III nature. This dyspnea has been stable all along without any associated weight change. It is present with exertion relieved by rest. Is known orthopnea paroxysmal nocturnal dyspnea. She's had occasional associated dry cough but this is not consistent in the same with wheezing which is not consistent. There is no associated chest pain.  According to her history she's been seeing Dr. 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 According to her and review of the chart this is localized only to the ocular muscles she is on pyridostigmine. At this time a high-resolution CT chest was done 04/27/2016 that I personally visualized that shows coronary artery calcification which is unaware of and also mild subtle ILD changes. She subsequently saw Dr. Blake Divine January 2018 and autoimmune profile showed elevated CCP that was isolated but no clinical or systemic evidence of rheumatoid arthritis. The diagnoses on the joints is still osteoarthritis. Therefore she's been referred  here. There is no associated chest pain     review of the chart shows she's not had an echocardiogram a cardiac stress test btu she tells me she has one at [facility] with her cardioloigist 2 years ago was normal  Walking desaturation test 185 feet 3 laps on room air: Did not desaturate.  Results for [name] (MRN [id number]) as of 06/04/2016 15:47  Ref. Range 01/15/2016 13:00  Acetylchol Block Ab Latest Ref Range: 0 - 25 % 31 (H)  Acety choline binding ab Latest Ref Range: 0.00 - 0.24 nmol/L 6.16 (H)  Acetylcholine Modulat Ab Latest Ref Range: 0 - 20 % 48 (H)   Results for [name] (MRN [id number]) as of 06/04/2016 15:47  Ref. Range 05/02/2016 10:00  CK Total Latest Ref Range: 7 - 177 U/L 99  Sed Rate Latest Ref Range: 0 - 30 mm/hr 4  Anit Nuclear Antibody(ANA) Latest Ref Range: NEGATIVE  NEG  Angiotensin-Converting Enzyme Latest Ref Range: 9 - 67 U/L 34  Cyclic Citrullin Peptide Ab Latest Units: Units 109 (H)  ds DNA Ab Latest Units: IU/mL <1  Myeloperoxidase Abs Latest Ref Range: <1.0 AI  <1.0  Serine Protease 3 Latest Ref Range: <1.0 AI  <1.0  RA Latex Turbid. Latest Ref Range: <14 IU/mL <14  ENA SM Ab Ser-aCnc Latest Ref Range: <1.0 NEG AI  <1.0 NEG  Ribonucleic Protein(ENA) Antibody, IgG Latest Ref Range: <1.0 NEG AI  <1.0 NEG  SSA (Ro) (ENA) Antibody, IgG Latest Ref Range: <1.0 NEG AI  <1.0 NEG  SSB (La) (ENA) Antibody, IgG Latest Ref Range: <1.0 NEG AI  <1.0 NEG  Scleroderma (Scl-70) (ENA) Antibody, IgG Latest Ref Range: <1.0 NEG AI  <1.0 NEG      OV 12/03/2016  Chief Complaint  Patient presents with  . Follow-up    Pt states her breathing is unchanged since last OV. Pt denies significant cough, CP/tightness, f/c/s.    Follow-up shortness of breath that is multifactorial but associated with interstitial lung disease on CT scan of the chest December 2017 associated with positive CCP antibody and a diagnosis of osteoarthritis but not rheumatoid arthritis in the  joints  She continues to be stable. There are no interim issues no chest pain. She only has mild shortness of breath with exertion. Her ocular myasthenia is improved. Pulmonary function test shows mild reduction in isolated diffusion capacity 74%. Forced vital capacity and lung volumes are normal. High-resolution CT chest shows subtle ILD in the bases non-UIP pattern.  CT chest high resolution IMPRESSION: 1. 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 2. Aortic atherosclerosis, in addition to left main and 3 vessel coronary artery disease. Please note that although the presence of coronary artery calcium documents the presence of coronary artery disease, the severity of this disease and any potential stenosis cannot be assessed on this non-gated CT examination. Assessment for potential risk factor modification, dietary therapy or pharmacologic therapy may be warranted, if clinically indicated. 3. Mild hepatic steatosis. 4. Additional incidental findings, as above.  Aortic Atherosclerosis (ICD10-I70.0).   Electronically Signed   By: Vinnie Langton M.D.   On: 11/29/2016 10:15    OV 06/03/2017   Chief Complaint  Patient presents with  . Follow-up    PFT done today.  Pt states her breathing is about the same as last visit.  Pt recently had a sinus infection but is not currently on any meds. Has complaints of a cough with green mucus. Denies any CP.    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  In the setting of myasthenia gravis  Follow-up coronary artery calcification  Last visit August 2018.  Overall stable since then.  She reports significant dyspnea when climbing stairs but otherwise doing well.  Overall stable.  And  walking desaturation test she got very tachycardic even though she did not desaturate.  She is known to have coronary artery calcification and I referred her to cardiology but today she tells me that she follows for this at [facility] with Dr. Claudie Leach and her stress test is upcoming.  She has never been to pulmonary rehabilitation.  There is no associated chest pain or cough.  Follow pulmonary function test shows continued stability but perhaps reduction in DLCO.  On exam she has no crackles.  Results for ELVIN, BANKER (MRN [id number]) as of 06/03/2017 11:52  Ref. Range 12/03/2016 09:51 06/03/2017 10:48  FVC-Pre Latest Units: L 2.66 2.66  FVC-%Pred-Pre Latest Units: % 92 93   Results for JACQUILINE, ZURCHER (MRN [id number]) as of 06/03/2017 11:52  Ref. Range 12/03/2016 09:51 06/03/2017 10:48  DLCO unc Latest Units: ml/min/mmHg 17.17 16.35  DLCO unc % pred Latest Units: % 74 71    Walking desaturation test on 06/03/2017 185 feet x 3 laps on ROOM AIR:  did NOT desaturate. Rest pulse ox was 100%, final  pulse ox was 99%. HR response 75/min at rest to 112/min at peak exertion. Patient DANELY BAYLISS  Did ot Desaturate < 88% . Daleen Snook did not  Desaturated </= 3% points. Daleen Snook yes did get tachyardic    has a past medical history of Arthritis, Bursitis, Diabetes mellitus without complication ([place]), Fibromyalgia, GERD (gastroesophageal reflux disease), Hyperlipemia, Hypertension, Sinusitis, Sleep apnea, Sleep apnea, and Wears glasses.   reports that  has never smoked. she has never used smokeless tobacco.  Past Surgical History:  Procedure Laterality Date  . ABDOMINAL HYSTERECTOMY  1999  . BREAST SURGERY  2002   rt br bx  . CERVICAL FUSION  2007  . CHOLECYSTECTOMY  2005   lap choli  . COLONOSCOPY    . FINGER GANGLION CYST EXCISION    . SHOULDER ARTHROSCOPY WITH OPEN ROTATOR CUFF REPAIR AND DISTAL CLAVICLE ACROMINECTOMY Right 09/09/2012   Procedure: Right Shoulder  Arthroscopy with distal clavicle excision and mini-open rotator cuff repair.;  Surgeon: Alta Corning, MD;  Location: [place];  Service: Orthopedics;  Laterality: Right;  . TONSILLECTOMY      Allergies  Allergen Reactions  . Latex Rash  . Celebrex [Celecoxib] Swelling    hives  . Vioxx [Rofecoxib] Hives    Immunization History  Administered Date(s) Administered  . Influenza Split 02/02/2016  . Influenza, High Dose Seasonal PF 03/08/2015, 01/27/2017  . Pneumococcal Conjugate-13 06/01/2015  . Pneumococcal Polysaccharide-23 01/28/2012  . Pneumococcal-Unspecified 06/04/2013  . Tdap 04/29/2009    Family History  Problem Relation Age of Onset  . Emphysema Father   . Intracerebral hemorrhage Sister      Current Outpatient Medications:  .  ACCU-CHEK AVIVA PLUS test strip, Apply 1 strip topically daily. for testing, Disp: , Rfl: 1 .  ACCU-CHEK SOFTCLIX LANCETS lancets, TEST DAILY E11.9, Disp: , Rfl: 1 .  APPLE CIDER VINEGAR PO, Take by mouth daily. , Disp: , Rfl:  .  aspirin EC 81 MG tablet, Take 81 mg by mouth daily., Disp: , Rfl:  .  cetirizine (ZYRTEC) 10 MG tablet, Take 10 mg by mouth daily., Disp: , Rfl: 0 .  Cholecalciferol (VITAMIN D3) 1000 units CAPS, Take by mouth., Disp: , Rfl:  .  co-enzyme Q-10 30 MG capsule, Take 30 mg by mouth 3 (three) times daily., Disp: , Rfl:  .  [employer] NATURAL LUTEIN EYE HEALTH PO, Take 1 capsule by mouth daily. , Disp: , Rfl:  .  fluticasone (FLONASE) 50 MCG/ACT nasal spray, Place 2 sprays into both nostrils daily. , Disp: , Rfl:  .  gabapentin (NEURONTIN) 100 MG capsule, Take 100 mg by mouth 2 (two) times daily., Disp: , Rfl:  .  gabapentin (NEURONTIN) 300 MG capsule, Take by mouth daily. , Disp: , Rfl:  .  glipiZIDE (GLUCOTROL XL) 10 MG 24 hr tablet, TAKE 1 TABLET BY MOUTH AFTER LUNCH AND SUPPER, Disp: , Rfl:  .  losartan-hydrochlorothiazide (HYZAAR) 100-25 MG tablet, TAKE (1) TABLET BY MOUTH ONCE DAILY., Disp: , Rfl:  .   metFORMIN (GLUCOPHAGE-XR) 500 MG 24 hr tablet, TAKE 2 TABLETS TWICE DAILY AS DIRECTED., Disp: , Rfl:  .  Misc Natural Products (TART CHERRY ADVANCED) CAPS, Take by mouth., Disp: , Rfl:  .  Misc. Devices MISC, Avivia Plus lancets and strips. Tests daily  DX E11.9, Disp: , Rfl:  .  omega-3 acid ethyl esters (LOVAZA) 1 g capsule, TAKE (2) CAPSULES BY MOUTH TWICE DAILY., Disp: , Rfl:  .  pregabalin (LYRICA) 50 MG capsule, Take 50 mg by mouth 2 (two) times daily. , Disp: , Rfl:  .  pyridostigmine (MESTINON) 60 MG tablet, Try to take 1 tab 4 times daily, Disp: 120 tablet, Rfl: 6 .  rosuvastatin (CRESTOR) 10 MG tablet, TAKE (1) TABLET BY MOUTH ONCE DAILY., Disp: , Rfl:  .  sitaGLIPtin (JANUVIA) 100 MG tablet, TAKE (1) TABLET BY MOUTH ONCE DAILY., Disp: , Rfl:  .  triamcinolone cream (KENALOG) 0.1 %, , Disp: , Rfl:  .  TURMERIC PO, Take by mouth., Disp: , Rfl:    Review of Systems     Objective:   Physical Exam  Constitutional: She is oriented to person, place, and time. She appears well-developed and well-nourished. No distress.  obese  HENT:  Head: Normocephalic and atraumatic.  Right Ear: External ear normal.  Left Ear: External ear normal.  Mouth/Throat: Oropharynx is clear and moist. No oropharyngeal exudate.  Eyes: Conjunctivae and EOM are normal. Pupils are equal, round, and reactive to light. Right eye exhibits no discharge. Left eye exhibits no discharge. No scleral icterus.  Neck: Normal range of motion. Neck supple. No JVD present. No tracheal deviation present. No thyromegaly present.  Cardiovascular: Normal rate, regular rhythm, normal heart sounds and intact distal pulses. Exam reveals no gallop and no friction rub.  No murmur heard. Pulmonary/Chest: Effort normal and breath sounds normal. No respiratory distress. She has no wheezes. She has no rales. She exhibits no tenderness.  No defnite crack;es  Abdominal: Soft. Bowel sounds are normal. She exhibits no distension and no mass.  There is no tenderness. There is no rebound and no guarding.  Visceral obesity +  Musculoskeletal: Normal range of motion. She exhibits no edema or tenderness.  Looks deconditoned  Lymphadenopathy:    She has no cervical adenopathy.  Neurological: She is alert and oriented to person, place, and time. She has normal reflexes. No cranial nerve deficit. She exhibits normal muscle tone. Coordination normal.  Skin: Skin is warm and dry. No rash noted. She is not diaphoretic. No erythema. No pallor.  Psychiatric: She has a normal mood and affect. Her behavior is normal. Judgment and thought content normal.  Vitals reviewed.   Vitals:   06/03/17 1146  BP: 118/72  Pulse: 74  SpO2: 97%  Weight: 209 lb (94.8 kg)  Height: 5\' 3"  (1.6 m)    Estimated body mass index is 37.02 kg/m as calculated from the following:   Height as of this encounter: 5\' 3"  (1.6 m).   Weight as of this encounter: 209 lb (94.8 kg).     Assessment:       ICD-10-CM   1. ILD (interstitial lung disease) ([place]) J84.9   2. Positive anti-CCP test R76.8   3. Coronary artery calcification seen on CAT scan I25.10        Plan:       - symptoms and breathing test stable - some of the thinking aaround fibrosis is changing and in absence of definite clinical joint RA you might be a candidate of lung biopsy - however, hold off on biopsy given stability + not hearing crackles +  potential risk with Myasthenia Gravis - refer pulmonary rehab for shortness of breath and ILD (shortness of breath is due to ILD, weight, myasthenia and deconditioning) - d0 HRCT in oct 2019 - supine and prine   Coronary artery calcification seen on CAT scan - recommend follow with Dr Claudie Leach at [place] and ensure normal stress  test before pulmonary rehab   Followup Oct 2019 but after HRCT also in OCt 2019    > 50% of this > 25 min visit spent in face to face counseling or coordination of care   Dr. Brand Males, M.D.,  [facility].C.P Pulmonary and Critical Care Medicine Staff Physician, [place] Director - Interstitial Lung Disease  Program  Pulmonary [place] [facility] [place], [place], [address]  Pager: [phone], If no answer or between  15:00h - 7:00h: call [phone] Telephone: [phone]

## 2017-06-03 NOTE — Patient Instructions (Addendum)
ICD-10-CM   1. ILD (interstitial lung disease) ([place]) J84.9   2. Positive anti-CCP test R76.8   3. Coronary artery calcification seen on CAT scan I25.10     ILD (interstitial lung disease) ([facility]) Positive anti-CCP test    - symptoms and breathing test stable - some of the thinking aaround fibrosis is changing and in absence of definite clinical joint RA you might be a candidate of lung biopsy - however, hold off on biopsy given stability + potential risk with Myasthenia Gravis - refer pulmonary rehab for shortness of breath and ILD (shortness of breath is due to ILD, weight, myasthenia and deconditioning) - d0 HRCT in oct 2019 - supine and prine   Coronary artery calcification seen on CAT scan - recommend follow with Dr Claudie Leach at [place] and ensure normal stress test before pulmonary rehab   Followup Oct 2019 but after HRCT also in OCt 2019

## 2017-06-03 NOTE — Progress Notes (Signed)
Spirometry and DLCO done today. 

## 2017-06-05 DIAGNOSIS — M9903 Segmental and somatic dysfunction of lumbar region: Secondary | ICD-10-CM | POA: Diagnosis not present

## 2017-06-05 DIAGNOSIS — M47816 Spondylosis without myelopathy or radiculopathy, lumbar region: Secondary | ICD-10-CM | POA: Diagnosis not present

## 2017-06-05 DIAGNOSIS — S338XXA Sprain of other parts of lumbar spine and pelvis, initial encounter: Secondary | ICD-10-CM | POA: Diagnosis not present

## 2017-06-19 DIAGNOSIS — S338XXA Sprain of other parts of lumbar spine and pelvis, initial encounter: Secondary | ICD-10-CM | POA: Diagnosis not present

## 2017-06-19 DIAGNOSIS — M9903 Segmental and somatic dysfunction of lumbar region: Secondary | ICD-10-CM | POA: Diagnosis not present

## 2017-06-19 DIAGNOSIS — M47816 Spondylosis without myelopathy or radiculopathy, lumbar region: Secondary | ICD-10-CM | POA: Diagnosis not present

## 2017-07-01 ENCOUNTER — Encounter (HOSPITAL_COMMUNITY)
Admission: RE | Admit: 2017-07-01 | Discharge: 2017-07-01 | Disposition: A | Discharge status: Home / Self Care | Payer: PPO | Source: Ambulatory Visit | Attending: Internal Medicine | Admitting: Internal Medicine

## 2017-07-01 ENCOUNTER — Encounter (HOSPITAL_COMMUNITY): Payer: Self-pay

## 2017-07-01 VITALS — BP 132/64 | HR 82 | Ht 65.0 in | Wt 214.1 lb

## 2017-07-01 DIAGNOSIS — E119 Type 2 diabetes mellitus without complications: Secondary | ICD-10-CM | POA: Diagnosis not present

## 2017-07-01 DIAGNOSIS — M797 Fibromyalgia: Secondary | ICD-10-CM | POA: Diagnosis not present

## 2017-07-01 DIAGNOSIS — J849 Interstitial pulmonary disease, unspecified: Secondary | ICD-10-CM | POA: Diagnosis not present

## 2017-07-01 DIAGNOSIS — Z7982 Long term (current) use of aspirin: Secondary | ICD-10-CM | POA: Diagnosis not present

## 2017-07-01 DIAGNOSIS — G4730 Sleep apnea, unspecified: Secondary | ICD-10-CM | POA: Insufficient documentation

## 2017-07-01 DIAGNOSIS — I10 Essential (primary) hypertension: Secondary | ICD-10-CM | POA: Insufficient documentation

## 2017-07-01 DIAGNOSIS — E785 Hyperlipidemia, unspecified: Secondary | ICD-10-CM | POA: Insufficient documentation

## 2017-07-01 DIAGNOSIS — K219 Gastro-esophageal reflux disease without esophagitis: Secondary | ICD-10-CM | POA: Diagnosis not present

## 2017-07-01 DIAGNOSIS — Z7984 Long term (current) use of oral hypoglycemic drugs: Secondary | ICD-10-CM | POA: Diagnosis not present

## 2017-07-01 DIAGNOSIS — Z79899 Other long term (current) drug therapy: Secondary | ICD-10-CM | POA: Insufficient documentation

## 2017-07-01 NOTE — Progress Notes (Signed)
Cardiac/Pulmonary Rehab Medication Review by a Pharmacist  Does the patient  feel that his/her medications are working for him/her?  yes  Has the patient been experiencing any side effects to the medications prescribed?  yes  Does the patient measure his/her own blood pressure or blood glucose at home?  no   Does the patient have any problems obtaining medications due to transportation or finances?   no  Understanding of regimen: good Understanding of indications: good Potential of compliance: good  Questions asked to Determine Patient Understanding of Medication Regimen:  1. What is the name of the medication?  2. What is the medication used for?  3. When should it be taken?  4. How much should be taken?  5. How will you take it?  6. What side effects should you report?  Understanding Defined as: Excellent: All questions above are correct Good: Questions 1-4 are correct Fair: Questions 1-2 are correct  Poor: 1 or none of the above questions are correct   Pharmacist comments: Pt does c/o some nausea and diarrhea with Mestinon.  No other side effects reported.  Pt states she had to reduce dose of Mestinon due to this.  Pt does not check BP at home but does check blood sugar.  Med list updated with herbal and OTC products pt takes.    Hart Robinsons A 07/01/2017 2:29 PM

## 2017-07-01 NOTE — Progress Notes (Signed)
Daily Session Note  Patient Details  Name: [name] MRN: [id number] Date of Birth: 1945-08-08 Referring Provider:     PULMONARY REHAB OTHER RESP ORIENTATION from 07/01/2017 in [place]  Referring Provider  Dr. Chase Caller       Encounter Date: 07/01/2017  Check In: Session Check In - 07/01/17 1230      Check-In   Location  [facility]    Staff Present  Rella Egelston Angelina Pih, MS, EP, [facility], Exercise Physiologist;Gregory Luther Parody, BS, EP, Exercise Physiologist    Supervising physician immediately available to respond to emergencies  See telemetry face sheet for immediately available MD    Medication changes reported      No    Fall or balance concerns reported     Yes    Comments  Has fallen twice in the past 12 months.     Tobacco Cessation  -- Never smoked    Warm-up and Cool-down  Performed as group-led instruction    Resistance Training Performed  Yes    VAD Patient?  No      Pain Assessment   Currently in Pain?  No/denies Only in pain during walk test. Not at rest.     Multiple Pain Sites  No       Capillary Blood Glucose: No results found for this or any previous visit (from the past 24 hour(s)).    Social History   Tobacco Use  Smoking Status Never Smoker  Smokeless Tobacco Never Used    Goals Met:  Independence with exercise equipment Using PLB without cueing & demonstrates good technique Exercise tolerated well No report of cardiac concerns or symptoms Strength training completed today  Goals Unmet:  Not Applicable  Comments: Check out: 1530   Dr. Sinda Du is Medical Director for [facility].

## 2017-07-01 NOTE — Progress Notes (Signed)
Pulmonary Individual Treatment Plan  Patient Details  Name: [name] MRN: [id number] Date of Birth: [birth year] Referring Provider:     PULMONARY REHAB OTHER RESP ORIENTATION from 07/01/2017 in [place]  Referring Provider  Dr. Chase Caller       Initial Encounter Date:    [facility] from 07/01/2017 in [facility] [place]  Date  07/01/17  Referring Provider  Dr. Chase Caller       Visit Diagnosis: ILD (interstitial lung disease) ([place])  Patient's Home Medications on Admission:   Current Outpatient Medications:  .  Calcium Carb-Cholecalciferol (CALCIUM+D3) 600-800 MG-UNIT TABS, Take 1 tablet by mouth daily., Disp: , Rfl:  .  co-enzyme Q-10 50 MG capsule, Take 100 mg by mouth daily., Disp: , Rfl:  .  diclofenac sodium (VOLTAREN) 1 % GEL, Apply 1 application topically 2 (two) times daily., Disp: , Rfl:  .  Ginger, Zingiber officinalis, (GINGER ROOT) 550 MG CAPS, Take 1 capsule by mouth daily., Disp: , Rfl:  .  levocarnitine (CARNITOR) 250 MG capsule, Take 500 mg by mouth daily., Disp: , Rfl:  .  Nutritional Supplements (EQ ESTROBLEND MENOPAUSE) TABS, Take 1 tablet by mouth daily., Disp: , Rfl:  .  omega-3 acid ethyl esters (LOVAZA) 1 g capsule, Take 1 g by mouth daily., Disp: , Rfl:  .  rosuvastatin (CRESTOR) 5 MG tablet, Take 5 mg by mouth daily at 6 PM., Disp: , Rfl:  .  Turmeric Curcumin 500 MG CAPS, Take 1 capsule by mouth daily., Disp: , Rfl:  .  ACCU-CHEK AVIVA PLUS test strip, Apply 1 strip topically daily. for testing, Disp: , Rfl: 1 .  ACCU-CHEK SOFTCLIX LANCETS lancets, TEST DAILY E11.9, Disp: , Rfl: 1 .  APPLE CIDER VINEGAR PO, Take by mouth daily. , Disp: , Rfl:  .  aspirin EC 81 MG tablet, Take 81 mg by mouth daily., Disp: , Rfl:  .  cetirizine (ZYRTEC) 10 MG tablet, Take 10 mg by mouth daily., Disp: , Rfl: 0 .  [employer] NATURAL LUTEIN EYE HEALTH PO, Take 1 capsule by mouth daily. , Disp: , Rfl:  .  fluticasone  (FLONASE) 50 MCG/ACT nasal spray, Place 2 sprays into both nostrils daily. , Disp: , Rfl:  .  gabapentin (NEURONTIN) 100 MG capsule, Take 100 mg by mouth 2 (two) times daily., Disp: , Rfl:  .  gabapentin (NEURONTIN) 300 MG capsule, Take by mouth daily. , Disp: , Rfl:  .  glipiZIDE (GLUCOTROL XL) 10 MG 24 hr tablet, TAKE 1 TABLET BY MOUTH AFTER LUNCH AND SUPPER, Disp: , Rfl:  .  losartan-hydrochlorothiazide (HYZAAR) 100-25 MG tablet, TAKE (1) TABLET BY MOUTH ONCE DAILY., Disp: , Rfl:  .  metFORMIN (GLUCOPHAGE-XR) 500 MG 24 hr tablet, TAKE 2 TABLETS TWICE DAILY AS DIRECTED., Disp: , Rfl:  .  Misc Natural Products (TART CHERRY ADVANCED) CAPS, Take by mouth., Disp: , Rfl:  .  Misc. Devices MISC, Avivia Plus lancets and strips. Tests daily  DX E11.9, Disp: , Rfl:  .  pregabalin (LYRICA) 50 MG capsule, Take 50 mg by mouth 2 (two) times daily. , Disp: , Rfl:  .  pyridostigmine (MESTINON) 60 MG tablet, Try to take 1 tab 4 times daily, Disp: 120 tablet, Rfl: 6 .  sitaGLIPtin (JANUVIA) 100 MG tablet, TAKE (1) TABLET BY MOUTH ONCE DAILY., Disp: , Rfl:   Past Medical History: Past Medical History:  Diagnosis Date  . Arthritis   . Bursitis   . Diabetes  mellitus without complication ([place])   . Fibromyalgia   . GERD (gastroesophageal reflux disease)   . Hyperlipemia   . Hypertension   . Sinusitis   . Sleep apnea    uses a cpap  . Sleep apnea   . Wears glasses     Tobacco Use: Social History   Tobacco Use  Smoking Status Never Smoker  Smokeless Tobacco Never Used    Labs: Recent Review Scientist, physiological    Labs for ITP Cardiac and Pulmonary Rehab Latest Ref Rng & Units 01/07/2011   TCO2 0 - 100 mmol/L 25      Capillary Blood Glucose: Lab Results  Component Value Date   GLUCAP 140 (H) 09/09/2012     Pulmonary Assessment Scores: Pulmonary Assessment Scores    Row Name 07/01/17 1540         ADL UCSD   ADL Phase  Entry     SOB Score total  33     Rest  1     Walk  10     Stairs   4     Bath  0     Dress  0     Shop  0       CAT Score   CAT Score  11       mMRC Score   mMRC Score  3        Pulmonary Function Assessment: Pulmonary Function Assessment - 07/01/17 1539      Pulmonary Function Tests   FVC%  93 %    FEV1%  110 %    FEV1/FVC Ratio  117    RV%  35 %    DLCO%  71 %      Breath   Bilateral Breath Sounds  Clear    Shortness of Breath  Yes       Exercise Target Goals: Date: 07/01/17  Exercise Program Goal: Individual exercise prescription set using results from initial 6 min walk test and THRR while considering  patient's activity barriers and safety.   Exercise Prescription Goal: Initial exercise prescription builds to 30-45 minutes a day of aerobic activity, 2-3 days per week.  Home exercise guidelines will be given to patient during program as part of exercise prescription that the participant will acknowledge.  Activity Barriers & Risk Stratification: Activity Barriers & Cardiac Risk Stratification - 07/01/17 1308      Activity Barriers & Cardiac Risk Stratification   Activity Barriers  Neck/Spine Problems;History of Falls;Deconditioning;Balance Concerns;Assistive Device;Shortness of Breath    Cardiac Risk Stratification  High       6 Minute Walk: 6 Minute Walk    Row Name 07/01/17 1411         6 Minute Walk   Phase  Initial     Distance  950 feet     Distance % Change  0 %     Distance Feet Change  0 ft     Walk Time  6 minutes     # of Rest Breaks  0     MPH  1.79     METS  2.37     RPE  11     Perceived Dyspnea   13     VO2 Peak  8.17     Symptoms  No     Resting HR  82 bpm     Resting BP  132/64     Resting Oxygen Saturation   97 %     Exercise Oxygen  Saturation  during 6 min walk  97 %     Max Ex. HR  112 bpm     Max Ex. BP  180/76     2 Minute Post BP  136/64        Oxygen Initial Assessment: Oxygen Initial Assessment - 07/01/17 1556      Home Oxygen   Home Oxygen Device  None    Sleep Oxygen  Prescription  None    Home Exercise Oxygen Prescription  None    Home at Rest Exercise Oxygen Prescription  None      Initial 6 min Walk   Oxygen Used  None      Program Oxygen Prescription   Program Oxygen Prescription  None       Oxygen Re-Evaluation:   Oxygen Discharge (Final Oxygen Re-Evaluation):   Initial Exercise Prescription: Initial Exercise Prescription - 07/01/17 1300      Date of Initial Exercise RX and Referring Provider   Date  07/01/17    Referring Provider  Dr. Chase Caller       NuStep   Level  2    SPM  66    Minutes  15    METs  1.4      Arm Ergometer   Level  1.2    Watts  12    RPM  12    Minutes  20    METs  1.9      Prescription Details   Frequency (times per week)  3    Duration  Progress to 30 minutes of continuous aerobic without signs/symptoms of physical distress      Intensity   THRR 40-80% of Max Heartrate  [phone]    Ratings of Perceived Exertion  11-13    Perceived Dyspnea  0-4      Progression   Progression  Continue progressive overload as per policy without signs/symptoms or physical distress.      Resistance Training   Training Prescription  Yes    Weight  1    Reps  10-15       Perform Capillary Blood Glucose checks as needed.  Exercise Prescription Changes:   Exercise Comments:   Exercise Goals and Review:  Exercise Goals    Row Name 07/01/17 1313             Exercise Goals   Increase Physical Activity  Yes       Intervention  Provide advice, education, support and counseling about physical activity/exercise needs.;Develop an individualized exercise prescription for aerobic and resistive training based on initial evaluation findings, risk stratification, comorbidities and participant's personal goals.       Expected Outcomes  Short Term: Attend rehab on a regular basis to increase amount of physical activity.       Increase Strength and Stamina  Yes       Intervention  Develop an individualized  exercise prescription for aerobic and resistive training based on initial evaluation findings, risk stratification, comorbidities and participant's personal goals.;Provide advice, education, support and counseling about physical activity/exercise needs.       Expected Outcomes  Short Term: Increase workloads from initial exercise prescription for resistance, speed, and METs.       Able to understand and use rate of perceived exertion (RPE) scale  Yes       Intervention  Provide education and explanation on how to use RPE scale       Expected Outcomes  Short Term: Able to use RPE  daily in rehab to express subjective intensity level;Long Term:  Able to use RPE to guide intensity level when exercising independently       Able to understand and use Dyspnea scale  Yes       Intervention  Provide education and explanation on how to use Dyspnea scale       Expected Outcomes  Short Term: Able to use Dyspnea scale daily in rehab to express subjective sense of shortness of breath during exertion;Long Term: Able to use Dyspnea scale to guide intensity level when exercising independently       Knowledge and understanding of Target Heart Rate Range (THRR)  Yes       Intervention  Provide education and explanation of THRR including how the numbers were predicted and where they are located for reference       Expected Outcomes  Short Term: Able to state/look up THRR;Long Term: Able to use THRR to govern intensity when exercising independently;Short Term: Able to use daily as guideline for intensity in rehab       Able to check pulse independently  Yes       Intervention  Provide education and demonstration on how to check pulse in carotid and radial arteries.;Review the importance of being able to check your own pulse for safety during independent exercise       Expected Outcomes  Short Term: Able to explain why pulse checking is important during independent exercise;Long Term: Able to check pulse independently and  accurately       Understanding of Exercise Prescription  Yes       Intervention  Provide education, explanation, and written materials on patient's individual exercise prescription       Expected Outcomes  Short Term: Able to explain program exercise prescription;Long Term: Able to explain home exercise prescription to exercise independently          Exercise Goals Re-Evaluation :   Discharge Exercise Prescription (Final Exercise Prescription Changes):   Nutrition:  Target Goals: Understanding of nutrition guidelines, daily intake of sodium 1500mg , cholesterol 200mg , calories 30% from fat and 7% or less from saturated fats, daily to have 5 or more servings of fruits and vegetables.  Biometrics: Pre Biometrics - 07/01/17 1313      Pre Biometrics   Height  5\' 5"  (1.651 m)    Weight  214 lb 1.6 oz (97.1 kg)    Waist Circumference  48.5 inches    Hip Circumference  47 inches    Waist to Hip Ratio  1.03 %    BMI (Calculated)  35.63    Triceps Skinfold  26 mm    % Body Fat  47.6 %    Grip Strength  28.93 kg    Flexibility  0 in    Single Leg Stand  0 seconds        Nutrition Therapy Plan and Nutrition Goals: Nutrition Therapy & Goals - 07/01/17 1559      Personal Nutrition Goals   Personal Goal #2  Patient eats a regular diet.     Additional Goals?  No      Intervention Plan   Expected Outcomes  Short Term Goal: A plan has been developed with personal nutrition goals set during dietitian appointment.;Long Term Goal: Adherence to prescribed nutrition plan.       Nutrition Assessments: Nutrition Assessments - 07/01/17 1600      MEDFICTS Scores   Pre Score  62       Nutrition  Goals Re-Evaluation:   Nutrition Goals Discharge (Final Nutrition Goals Re-Evaluation):   Psychosocial: Target Goals: Acknowledge presence or absence of significant depression and/or stress, maximize coping skills, provide positive support system. Participant is able to verbalize types and  ability to use techniques and skills needed for reducing stress and depression.  Initial Review & Psychosocial Screening: Initial Psych Review & Screening - 07/01/17 1603      Initial Review   Current issues with  Current Depression;Current Sleep Concerns      Family Dynamics   Good Support System?  Yes      Barriers   Psychosocial barriers to participate in program  Psychosocial barriers identified (see note) QOL score indicated depression at 15.56 overall.      Screening Interventions   Interventions  Encouraged to exercise;Provide feedback about the scores to participant    Expected Outcomes  Short Term goal: Identification and review with participant of any Quality of Life or Depression concerns found by scoring the questionnaire.;Long Term goal: The participant improves quality of Life and PHQ9 Scores as seen by post scores and/or verbalization of changes       Quality of Life Scores: Quality of Life - 07/01/17 1314      Quality of Life Scores   Health/Function Pre  13.27 %    Socioeconomic Pre  11.33 %    Psych/Spiritual Pre  18.07 %    Family Pre  24 %    GLOBAL Pre  15.56 %      Scores of 19 and below usually indicate a poorer quality of life in these areas.  A difference of  2-3 points is a clinically meaningful difference.  A difference of 2-3 points in the total score of the Quality of Life Index has been associated with significant improvement in overall quality of life, self-image, physical symptoms, and general health in studies assessing change in quality of life.   PHQ-9: Recent Review Flowsheet Data    Depression screen [facility] 2/9 07/01/2017   Decreased Interest 0   Down, Depressed, Hopeless 0   PHQ - 2 Score 0   Altered sleeping 2   Tired, decreased energy 3   Change in appetite 1   Feeling bad or failure about yourself  0   Trouble concentrating 0   Moving slowly or fidgety/restless 0   Suicidal thoughts 0   PHQ-9 Score 6   Difficult doing work/chores  Somewhat difficult     Interpretation of Total Score  Total Score Depression Severity:  1-4 = Minimal depression, 5-9 = Mild depression, 10-14 = Moderate depression, 15-19 = Moderately severe depression, 20-27 = Severe depression   Psychosocial Evaluation and Intervention: Psychosocial Evaluation - 07/01/17 1604      Psychosocial Evaluation & Interventions   Interventions  Encouraged to exercise with the program and follow exercise prescription    Comments  Patient does not want to be referred to a therapist.     Continue Psychosocial Services   Follow up required by staff       Psychosocial Re-Evaluation:   Psychosocial Discharge (Final Psychosocial Re-Evaluation):    Education: Education Goals: Education classes will be provided on a weekly basis, covering required topics. Participant will state understanding/return demonstration of topics presented.  Learning Barriers/Preferences: Learning Barriers/Preferences - 07/01/17 1518      Learning Barriers/Preferences   Learning Barriers  None    Learning Preferences  Verbal Instruction;Skilled Demonstration;Group Instruction       Education Topics: How Lungs Work and  Diseases: - Discuss the anatomy of the lungs and diseases that can affect the lungs, such as COPD.   Exercise: -Discuss the importance of exercise, FITT principles of exercise, normal and abnormal responses to exercise, and how to exercise safely.   Environmental Irritants: -Discuss types of environmental irritants and how to limit exposure to environmental irritants.   Meds/Inhalers and oxygen: - Discuss respiratory medications, definition of an inhaler and oxygen, and the proper way to use an inhaler and oxygen.   Energy Saving Techniques: - Discuss methods to conserve energy and decrease shortness of breath when performing activities of daily living.    Bronchial Hygiene / Breathing Techniques: - Discuss breathing mechanics, pursed-lip breathing  technique,  proper posture, effective ways to clear airways, and other functional breathing techniques   Cleaning Equipment: - Provides group verbal and written instruction about the health risks of elevated stress, cause of high stress, and healthy ways to reduce stress.   Nutrition I: Fats: - Discuss the types of cholesterol, what cholesterol does to the body, and how cholesterol levels can be controlled.   Nutrition II: Labels: -Discuss the different components of food labels and how to read food labels.   Respiratory Infections: - Discuss the signs and symptoms of respiratory infections, ways to prevent respiratory infections, and the importance of seeking medical treatment when having a respiratory infection.   Stress I: Signs and Symptoms: - Discuss the causes of stress, how stress may lead to anxiety and depression, and ways to limit stress.   Stress II: Relaxation: -Discuss relaxation techniques to limit stress.   Oxygen for Home/Travel: - Discuss how to prepare for travel when on oxygen and proper ways to transport and store oxygen to ensure safety.   Knowledge Questionnaire Score: Knowledge Questionnaire Score - 07/01/17 1539      Knowledge Questionnaire Score   Pre Score  15/18       Core Components/Risk Factors/Patient Goals at Admission: Personal Goals and Risk Factors at Admission - 07/01/17 1600      Core Components/Risk Factors/Patient Goals on Admission    Weight Management  Yes    Intervention  Weight Management/Obesity: Establish reasonable short term and long term weight goals.;Obesity: Provide education and appropriate resources to help participant work on and attain dietary goals.    Admit Weight  214 lb 1.6 oz (97.1 kg)    Goal Weight: Short Term  204 lb 1.6 oz (92.6 kg)    Goal Weight: Long Term  194 lb 1.6 oz (88 kg)    Expected Outcomes  Short Term: Continue to assess and modify interventions until short term weight is achieved;Long Term:  Adherence to nutrition and physical activity/exercise program aimed toward attainment of established weight goal    Personal Goal Other  Yes    Personal Goal  Better mobility, lose 25-30lbs    Intervention  Attend PR 2 x week and supplement at home exercise 3 x week.    Expected Outcomes  Reach personal goals.       Core Components/Risk Factors/Patient Goals Review:    Core Components/Risk Factors/Patient Goals at Discharge (Final Review):    ITP Comments: ITP Comments    Row Name 07/01/17 1543           ITP Comments  [name] is a 72 year old female that has ILD with SOB. She has also been diagnosed with Myasthenia Gravis She is depressed according to her QOL scores. We will continue to address this area as she  comes through our program.           Comments: Patient arrived for 1st visit/orientation/education at 1230. Patient was referred to PR by Dr. Chase Caller due to ILD (J84.9). During orientation advised patient on arrival and appointment times what to wear, what to do before, during and after exercise. Reviewed attendance and class policy. Talked about inclement weather and class consultation policy. Pt is scheduled to return Pulmonary Rehab on 07/03/17 at 10:45. Pt was advised to come to class 15 minutes before class starts. Patient was also given instructions on meeting with the dietician and attending the Family Structure classes. Discussed RPE/Dpysnea scales. Discussed initial THR and how to find their radial and/or carotid pulse. Discussed the initial exercise prescription and how this effects their progress. Pt is eager to get started. Patient participated in warm up stretches followed by light weights and resistance bands. Patient was able to complete 6 minute walk test. On last lap patient c/ chest pressure. Chest pressure subsided at 2 minute rest. Patient c/o pain in (L) shoulder 4/10. Pain in (L) shoulder subsided to 0/10 at 2 minute rest. Patient also excresenced SOB which  ended at rest. Pain was completely gone at the end of orientation. Patient was measured for the equipment. Discussed equipment safety with patient. Took patient pre-anthropometric measurements. Patient finished visit at 1530.

## 2017-07-03 ENCOUNTER — Encounter (HOSPITAL_COMMUNITY): Payer: PPO

## 2017-07-03 ENCOUNTER — Encounter (HOSPITAL_COMMUNITY): Payer: Self-pay

## 2017-07-03 DIAGNOSIS — J40 Bronchitis, not specified as acute or chronic: Secondary | ICD-10-CM | POA: Diagnosis not present

## 2017-07-03 DIAGNOSIS — J069 Acute upper respiratory infection, unspecified: Secondary | ICD-10-CM | POA: Diagnosis not present

## 2017-07-03 NOTE — Progress Notes (Signed)
Pulmonary Individual Treatment Plan  Patient Details  Name: LIBERTY SETO MRN: [id number] Date of Birth: 12/25/1945 Referring Provider:     PULMONARY REHAB OTHER RESP ORIENTATION from 07/01/2017 in [place]  Referring Provider  Dr. Chase Caller       Initial Encounter Date:    Ryan from 07/01/2017 in [place]  Date  07/01/17  Referring Provider  Dr. Chase Caller       Visit Diagnosis: ILD (interstitial lung disease) ([place])  Patient's Home Medications on Admission:   Current Outpatient Medications:  .  Calcium Carb-Cholecalciferol (CALCIUM+D3) 600-800 MG-UNIT TABS, Take 1 tablet by mouth daily., Disp: , Rfl:  .  co-enzyme Q-10 50 MG capsule, Take 100 mg by mouth daily., Disp: , Rfl:  .  diclofenac sodium (VOLTAREN) 1 % GEL, Apply 1 application topically 2 (two) times daily., Disp: , Rfl:  .  Ginger, Zingiber officinalis, (GINGER ROOT) 550 MG CAPS, Take 1 capsule by mouth daily., Disp: , Rfl:  .  levocarnitine (CARNITOR) 250 MG capsule, Take 500 mg by mouth daily., Disp: , Rfl:  .  Nutritional Supplements (EQ ESTROBLEND MENOPAUSE) TABS, Take 1 tablet by mouth daily., Disp: , Rfl:  .  omega-3 acid ethyl esters (LOVAZA) 1 g capsule, Take 1 g by mouth daily., Disp: , Rfl:  .  rosuvastatin (CRESTOR) 5 MG tablet, Take 5 mg by mouth daily at 6 PM., Disp: , Rfl:  .  Turmeric Curcumin 500 MG CAPS, Take 1 capsule by mouth daily., Disp: , Rfl:  .  ACCU-CHEK AVIVA PLUS test strip, Apply 1 strip topically daily. for testing, Disp: , Rfl: 1 .  ACCU-CHEK SOFTCLIX LANCETS lancets, TEST DAILY E11.9, Disp: , Rfl: 1 .  APPLE CIDER VINEGAR PO, Take by mouth daily. , Disp: , Rfl:  .  aspirin EC 81 MG tablet, Take 81 mg by mouth daily., Disp: , Rfl:  .  cetirizine (ZYRTEC) 10 MG tablet, Take 10 mg by mouth daily., Disp: , Rfl: 0 .  [employer] NATURAL LUTEIN EYE HEALTH PO, Take 1 capsule by mouth daily. , Disp: , Rfl:  .  fluticasone  (FLONASE) 50 MCG/ACT nasal spray, Place 2 sprays into both nostrils daily. , Disp: , Rfl:  .  gabapentin (NEURONTIN) 100 MG capsule, Take 100 mg by mouth 2 (two) times daily., Disp: , Rfl:  .  gabapentin (NEURONTIN) 300 MG capsule, Take by mouth daily. , Disp: , Rfl:  .  glipiZIDE (GLUCOTROL XL) 10 MG 24 hr tablet, TAKE 1 TABLET BY MOUTH AFTER LUNCH AND SUPPER, Disp: , Rfl:  .  losartan-hydrochlorothiazide (HYZAAR) 100-25 MG tablet, TAKE (1) TABLET BY MOUTH ONCE DAILY., Disp: , Rfl:  .  metFORMIN (GLUCOPHAGE-XR) 500 MG 24 hr tablet, TAKE 2 TABLETS TWICE DAILY AS DIRECTED., Disp: , Rfl:  .  Misc Natural Products (TART CHERRY ADVANCED) CAPS, Take by mouth., Disp: , Rfl:  .  Misc. Devices MISC, Avivia Plus lancets and strips. Tests daily  DX E11.9, Disp: , Rfl:  .  pregabalin (LYRICA) 50 MG capsule, Take 50 mg by mouth 2 (two) times daily. , Disp: , Rfl:  .  pyridostigmine (MESTINON) 60 MG tablet, Try to take 1 tab 4 times daily, Disp: 120 tablet, Rfl: 6 .  sitaGLIPtin (JANUVIA) 100 MG tablet, TAKE (1) TABLET BY MOUTH ONCE DAILY., Disp: , Rfl:   Past Medical History: Past Medical History:  Diagnosis Date  . Arthritis   . Bursitis   . Diabetes  mellitus without complication ([place])   . Fibromyalgia   . GERD (gastroesophageal reflux disease)   . Hyperlipemia   . Hypertension   . Sinusitis   . Sleep apnea    uses a cpap  . Sleep apnea   . Wears glasses     Tobacco Use: Social History   Tobacco Use  Smoking Status Never Smoker  Smokeless Tobacco Never Used    Labs: Recent Review Scientist, physiological    Labs for ITP Cardiac and Pulmonary Rehab Latest Ref Rng & Units 01/07/2011   TCO2 0 - 100 mmol/L 25      Capillary Blood Glucose: Lab Results  Component Value Date   GLUCAP 140 (H) 09/09/2012     Pulmonary Assessment Scores: Pulmonary Assessment Scores    Row Name 07/01/17 1540         ADL UCSD   ADL Phase  Entry     SOB Score total  33     Rest  1     Walk  10     Stairs   4     Bath  0     Dress  0     Shop  0       CAT Score   CAT Score  11       mMRC Score   mMRC Score  3        Pulmonary Function Assessment: Pulmonary Function Assessment - 07/01/17 1539      Pulmonary Function Tests   FVC%  93 %    FEV1%  110 %    FEV1/FVC Ratio  117    RV%  35 %    DLCO%  71 %      Breath   Bilateral Breath Sounds  Clear    Shortness of Breath  Yes       Exercise Target Goals: Date: 07/01/17  Exercise Program Goal: Individual exercise prescription set using results from initial 6 min walk test and THRR while considering  patient's activity barriers and safety.   Exercise Prescription Goal: Initial exercise prescription builds to 30-45 minutes a day of aerobic activity, 2-3 days per week.  Home exercise guidelines will be given to patient during program as part of exercise prescription that the participant will acknowledge.  Activity Barriers & Risk Stratification: Activity Barriers & Cardiac Risk Stratification - 07/01/17 1308      Activity Barriers & Cardiac Risk Stratification   Activity Barriers  Neck/Spine Problems;History of Falls;Deconditioning;Balance Concerns;Assistive Device;Shortness of Breath    Cardiac Risk Stratification  High       6 Minute Walk: 6 Minute Walk    Row Name 07/01/17 1411         6 Minute Walk   Phase  Initial     Distance  950 feet     Distance % Change  0 %     Distance Feet Change  0 ft     Walk Time  6 minutes     # of Rest Breaks  0     MPH  1.79     METS  2.37     RPE  11     Perceived Dyspnea   13     VO2 Peak  8.17     Symptoms  No     Resting HR  82 bpm     Resting BP  132/64     Resting Oxygen Saturation   97 %     Exercise Oxygen  Saturation  during 6 min walk  97 %     Max Ex. HR  112 bpm     Max Ex. BP  180/76     2 Minute Post BP  136/64        Oxygen Initial Assessment: Oxygen Initial Assessment - 07/01/17 1556      Home Oxygen   Home Oxygen Device  None    Sleep Oxygen  Prescription  None    Home Exercise Oxygen Prescription  None    Home at Rest Exercise Oxygen Prescription  None      Initial 6 min Walk   Oxygen Used  None      Program Oxygen Prescription   Program Oxygen Prescription  None       Oxygen Re-Evaluation:   Oxygen Discharge (Final Oxygen Re-Evaluation):   Initial Exercise Prescription: Initial Exercise Prescription - 07/01/17 1300      Date of Initial Exercise RX and Referring Provider   Date  07/01/17    Referring Provider  Dr. Chase Caller       NuStep   Level  2    SPM  66    Minutes  15    METs  1.4      Arm Ergometer   Level  1.2    Watts  12    RPM  12    Minutes  20    METs  1.9      Prescription Details   Frequency (times per week)  3    Duration  Progress to 30 minutes of continuous aerobic without signs/symptoms of physical distress      Intensity   THRR 40-80% of Max Heartrate  [id number]    Ratings of Perceived Exertion  11-13    Perceived Dyspnea  0-4      Progression   Progression  Continue progressive overload as per policy without signs/symptoms or physical distress.      Resistance Training   Training Prescription  Yes    Weight  1    Reps  10-15       Perform Capillary Blood Glucose checks as needed.  Exercise Prescription Changes:   Exercise Comments:   Exercise Goals and Review:  Exercise Goals    Row Name 07/01/17 1313             Exercise Goals   Increase Physical Activity  Yes       Intervention  Provide advice, education, support and counseling about physical activity/exercise needs.;Develop an individualized exercise prescription for aerobic and resistive training based on initial evaluation findings, risk stratification, comorbidities and participant's personal goals.       Expected Outcomes  Short Term: Attend rehab on a regular basis to increase amount of physical activity.       Increase Strength and Stamina  Yes       Intervention  Develop an individualized  exercise prescription for aerobic and resistive training based on initial evaluation findings, risk stratification, comorbidities and participant's personal goals.;Provide advice, education, support and counseling about physical activity/exercise needs.       Expected Outcomes  Short Term: Increase workloads from initial exercise prescription for resistance, speed, and METs.       Able to understand and use rate of perceived exertion (RPE) scale  Yes       Intervention  Provide education and explanation on how to use RPE scale       Expected Outcomes  Short Term: Able to use RPE  daily in rehab to express subjective intensity level;Long Term:  Able to use RPE to guide intensity level when exercising independently       Able to understand and use Dyspnea scale  Yes       Intervention  Provide education and explanation on how to use Dyspnea scale       Expected Outcomes  Short Term: Able to use Dyspnea scale daily in rehab to express subjective sense of shortness of breath during exertion;Long Term: Able to use Dyspnea scale to guide intensity level when exercising independently       Knowledge and understanding of Target Heart Rate Range (THRR)  Yes       Intervention  Provide education and explanation of THRR including how the numbers were predicted and where they are located for reference       Expected Outcomes  Short Term: Able to state/look up THRR;Long Term: Able to use THRR to govern intensity when exercising independently;Short Term: Able to use daily as guideline for intensity in rehab       Able to check pulse independently  Yes       Intervention  Provide education and demonstration on how to check pulse in carotid and radial arteries.;Review the importance of being able to check your own pulse for safety during independent exercise       Expected Outcomes  Short Term: Able to explain why pulse checking is important during independent exercise;Long Term: Able to check pulse independently and  accurately       Understanding of Exercise Prescription  Yes       Intervention  Provide education, explanation, and written materials on patient's individual exercise prescription       Expected Outcomes  Short Term: Able to explain program exercise prescription;Long Term: Able to explain home exercise prescription to exercise independently          Exercise Goals Re-Evaluation :   Discharge Exercise Prescription (Final Exercise Prescription Changes):   Nutrition:  Target Goals: Understanding of nutrition guidelines, daily intake of sodium 1500mg , cholesterol 200mg , calories 30% from fat and 7% or less from saturated fats, daily to have 5 or more servings of fruits and vegetables.  Biometrics: Pre Biometrics - 07/01/17 1313      Pre Biometrics   Height  5\' 5"  (1.651 m)    Weight  214 lb 1.6 oz (97.1 kg)    Waist Circumference  48.5 inches    Hip Circumference  47 inches    Waist to Hip Ratio  1.03 %    BMI (Calculated)  35.63    Triceps Skinfold  26 mm    % Body Fat  47.6 %    Grip Strength  28.93 kg    Flexibility  0 in    Single Leg Stand  0 seconds        Nutrition Therapy Plan and Nutrition Goals: Nutrition Therapy & Goals - 07/01/17 1559      Personal Nutrition Goals   Personal Goal #2  Patient eats a regular diet.     Additional Goals?  No      Intervention Plan   Expected Outcomes  Short Term Goal: A plan has been developed with personal nutrition goals set during dietitian appointment.;Long Term Goal: Adherence to prescribed nutrition plan.       Nutrition Assessments: Nutrition Assessments - 07/01/17 1600      MEDFICTS Scores   Pre Score  62       Nutrition  Goals Re-Evaluation:   Nutrition Goals Discharge (Final Nutrition Goals Re-Evaluation):   Psychosocial: Target Goals: Acknowledge presence or absence of significant depression and/or stress, maximize coping skills, provide positive support system. Participant is able to verbalize types and  ability to use techniques and skills needed for reducing stress and depression.  Initial Review & Psychosocial Screening: Initial Psych Review & Screening - 07/01/17 1603      Initial Review   Current issues with  Current Depression;Current Sleep Concerns      Family Dynamics   Good Support System?  Yes      Barriers   Psychosocial barriers to participate in program  Psychosocial barriers identified (see note) QOL score indicated depression at 15.56 overall.      Screening Interventions   Interventions  Encouraged to exercise;Provide feedback about the scores to participant    Expected Outcomes  Short Term goal: Identification and review with participant of any Quality of Life or Depression concerns found by scoring the questionnaire.;Long Term goal: The participant improves quality of Life and PHQ9 Scores as seen by post scores and/or verbalization of changes       Quality of Life Scores: Quality of Life - 07/01/17 1314      Quality of Life Scores   Health/Function Pre  13.27 %    Socioeconomic Pre  11.33 %    Psych/Spiritual Pre  18.07 %    Family Pre  24 %    GLOBAL Pre  15.56 %      Scores of 19 and below usually indicate a poorer quality of life in these areas.  A difference of  2-3 points is a clinically meaningful difference.  A difference of 2-3 points in the total score of the Quality of Life Index has been associated with significant improvement in overall quality of life, self-image, physical symptoms, and general health in studies assessing change in quality of life.   PHQ-9: Recent Review Flowsheet Data    Depression screen [facility] 2/9 07/01/2017   Decreased Interest 0   Down, Depressed, Hopeless 0   PHQ - 2 Score 0   Altered sleeping 2   Tired, decreased energy 3   Change in appetite 1   Feeling bad or failure about yourself  0   Trouble concentrating 0   Moving slowly or fidgety/restless 0   Suicidal thoughts 0   PHQ-9 Score 6   Difficult doing work/chores  Somewhat difficult     Interpretation of Total Score  Total Score Depression Severity:  1-4 = Minimal depression, 5-9 = Mild depression, 10-14 = Moderate depression, 15-19 = Moderately severe depression, 20-27 = Severe depression   Psychosocial Evaluation and Intervention: Psychosocial Evaluation - 07/01/17 1604      Psychosocial Evaluation & Interventions   Interventions  Encouraged to exercise with the program and follow exercise prescription    Comments  Patient does not want to be referred to a therapist.     Continue Psychosocial Services   Follow up required by staff       Psychosocial Re-Evaluation:   Psychosocial Discharge (Final Psychosocial Re-Evaluation):    Education: Education Goals: Education classes will be provided on a weekly basis, covering required topics. Participant will state understanding/return demonstration of topics presented.  Learning Barriers/Preferences: Learning Barriers/Preferences - 07/01/17 1518      Learning Barriers/Preferences   Learning Barriers  None    Learning Preferences  Verbal Instruction;Skilled Demonstration;Group Instruction       Education Topics: How Lungs Work and  Diseases: - Discuss the anatomy of the lungs and diseases that can affect the lungs, such as COPD.   Exercise: -Discuss the importance of exercise, FITT principles of exercise, normal and abnormal responses to exercise, and how to exercise safely.   Environmental Irritants: -Discuss types of environmental irritants and how to limit exposure to environmental irritants.   Meds/Inhalers and oxygen: - Discuss respiratory medications, definition of an inhaler and oxygen, and the proper way to use an inhaler and oxygen.   Energy Saving Techniques: - Discuss methods to conserve energy and decrease shortness of breath when performing activities of daily living.    Bronchial Hygiene / Breathing Techniques: - Discuss breathing mechanics, pursed-lip breathing  technique,  proper posture, effective ways to clear airways, and other functional breathing techniques   Cleaning Equipment: - Provides group verbal and written instruction about the health risks of elevated stress, cause of high stress, and healthy ways to reduce stress.   Nutrition I: Fats: - Discuss the types of cholesterol, what cholesterol does to the body, and how cholesterol levels can be controlled.   Nutrition II: Labels: -Discuss the different components of food labels and how to read food labels.   Respiratory Infections: - Discuss the signs and symptoms of respiratory infections, ways to prevent respiratory infections, and the importance of seeking medical treatment when having a respiratory infection.   Stress I: Signs and Symptoms: - Discuss the causes of stress, how stress may lead to anxiety and depression, and ways to limit stress.   Stress II: Relaxation: -Discuss relaxation techniques to limit stress.   Oxygen for Home/Travel: - Discuss how to prepare for travel when on oxygen and proper ways to transport and store oxygen to ensure safety.   Knowledge Questionnaire Score: Knowledge Questionnaire Score - 07/01/17 1539      Knowledge Questionnaire Score   Pre Score  15/18       Core Components/Risk Factors/Patient Goals at Admission: Personal Goals and Risk Factors at Admission - 07/01/17 1600      Core Components/Risk Factors/Patient Goals on Admission    Weight Management  Yes    Intervention  Weight Management/Obesity: Establish reasonable short term and long term weight goals.;Obesity: Provide education and appropriate resources to help participant work on and attain dietary goals.    Admit Weight  214 lb 1.6 oz (97.1 kg)    Goal Weight: Short Term  204 lb 1.6 oz (92.6 kg)    Goal Weight: Long Term  194 lb 1.6 oz (88 kg)    Expected Outcomes  Short Term: Continue to assess and modify interventions until short term weight is achieved;Long Term:  Adherence to nutrition and physical activity/exercise program aimed toward attainment of established weight goal    Personal Goal Other  Yes    Personal Goal  Better mobility, lose 25-30lbs    Intervention  Attend PR 2 x week and supplement at home exercise 3 x week.    Expected Outcomes  Reach personal goals.       Core Components/Risk Factors/Patient Goals Review:    Core Components/Risk Factors/Patient Goals at Discharge (Final Review):    ITP Comments: ITP Comments    Row Name 07/01/17 1543 07/03/17 1336         ITP Comments  Mrs. Nouri is a 72 year old female that has ILD with SOB. She has also been diagnosed with Myasthenia Gravis She is depressed according to her QOL scores. We will continue to address this area as she  comes through our program.   Patient new to program. Plans to start Tuesday 07/08/17. Will continue to monitor for progress.          Comments: ITP 30 Day REVIEW Patient new to program. Plans to start Tuesday 07/08/17. Will continue to monitor for progress.

## 2017-07-08 ENCOUNTER — Encounter (HOSPITAL_COMMUNITY): Payer: PPO

## 2017-07-10 ENCOUNTER — Encounter (HOSPITAL_COMMUNITY): Payer: PPO

## 2017-07-10 DIAGNOSIS — S338XXA Sprain of other parts of lumbar spine and pelvis, initial encounter: Secondary | ICD-10-CM | POA: Diagnosis not present

## 2017-07-10 DIAGNOSIS — M47816 Spondylosis without myelopathy or radiculopathy, lumbar region: Secondary | ICD-10-CM | POA: Diagnosis not present

## 2017-07-10 DIAGNOSIS — M9903 Segmental and somatic dysfunction of lumbar region: Secondary | ICD-10-CM | POA: Diagnosis not present

## 2017-07-15 ENCOUNTER — Encounter (HOSPITAL_COMMUNITY)
Admission: RE | Admit: 2017-07-15 | Discharge: 2017-07-15 | Disposition: A | Discharge status: Home / Self Care | Payer: PPO | Source: Ambulatory Visit | Attending: Internal Medicine | Admitting: Internal Medicine

## 2017-07-15 DIAGNOSIS — J849 Interstitial pulmonary disease, unspecified: Secondary | ICD-10-CM | POA: Diagnosis not present

## 2017-07-15 NOTE — Progress Notes (Signed)
Daily Session Note  Patient Details  Name: [name] MRN: [id number] Date of Birth: 1945/10/02 Referring Provider:     PULMONARY REHAB OTHER RESP ORIENTATION from 07/01/2017 in [place]  Referring Provider  Dr. Chase Caller       Encounter Date: 07/15/2017  Check In: Session Check In - 07/15/17 1056      Check-In   Location  [facility]    Staff Present  Diane Angelina Pih, MS, EP, [facility], Exercise Physiologist;Mitsuko Luera Luther Parody, BS, EP, Exercise Physiologist    Supervising physician immediately available to respond to emergencies  See telemetry face sheet for immediately available MD    Medication changes reported      No    Fall or balance concerns reported     Yes    Comments  Has fallen twice in the past 12 months.     Warm-up and Cool-down  Performed as group-led Higher education careers adviser Performed  Yes    VAD Patient?  No      Pain Assessment   Currently in Pain?  No/denies    Pain Score  0-No pain    Multiple Pain Sites  No       Capillary Blood Glucose: No results found for this or any previous visit (from the past 24 hour(s)).    Social History   Tobacco Use  Smoking Status Never Smoker  Smokeless Tobacco Never Used    Goals Met:  Independence with exercise equipment Improved SOB with ADL's Using PLB without cueing & demonstrates good technique Exercise tolerated well No report of cardiac concerns or symptoms Strength training completed today  Goals Unmet:  Not Applicable  Comments: Check out 1145   Dr. Sinda Du is Medical Director for [facility] Pulmonary Rehab.

## 2017-07-17 ENCOUNTER — Encounter (HOSPITAL_COMMUNITY)
Admission: RE | Admit: 2017-07-17 | Discharge: 2017-07-17 | Disposition: A | Discharge status: Home / Self Care | Payer: PPO | Source: Ambulatory Visit | Attending: Internal Medicine | Admitting: Internal Medicine

## 2017-07-17 DIAGNOSIS — S338XXA Sprain of other parts of lumbar spine and pelvis, initial encounter: Secondary | ICD-10-CM | POA: Diagnosis not present

## 2017-07-17 DIAGNOSIS — J849 Interstitial pulmonary disease, unspecified: Secondary | ICD-10-CM | POA: Diagnosis not present

## 2017-07-17 DIAGNOSIS — M9903 Segmental and somatic dysfunction of lumbar region: Secondary | ICD-10-CM | POA: Diagnosis not present

## 2017-07-17 DIAGNOSIS — M47816 Spondylosis without myelopathy or radiculopathy, lumbar region: Secondary | ICD-10-CM | POA: Diagnosis not present

## 2017-07-17 NOTE — Progress Notes (Signed)
Daily Session Note  Patient Details  Name: [name] MRN: [id number] Date of Birth: 1946-01-16 Referring Provider:     PULMONARY REHAB OTHER RESP ORIENTATION from 07/01/2017 in [name]  Referring Provider  Dr. Chase [name]       Encounter Date: 07/17/2017  Check In: Session Check In - 07/17/17 1040      Check-In   Location  [facility]    Staff Present  Crystal Angelina Pih, MS, EP, [facility], Exercise Physiologist;[name], BS, EP, Exercise Physiologist;Crystal Wynetta Emery, RN, BSN    Supervising physician immediately available to respond to emergencies  See telemetry face sheet for immediately available MD    Medication changes reported      No    Fall or balance concerns reported     Yes    Comments  Has fallen twice in the past 12 months.     Warm-up and Cool-down  Performed as group-led Higher education careers adviser Performed  Yes    VAD Patient?  No      Pain Assessment   Currently in Pain?  No/denies    Pain Score  0-No pain    Multiple Pain Sites  No       Capillary Blood Glucose: No results found for this or any previous visit (from the past 24 hour(s)).  Exercise Prescription Changes - 07/17/17 0700      Response to Exercise   Blood Pressure (Admit)  120/62    Blood Pressure (Exercise)  180/80    Blood Pressure (Exit)  148/82    Heart Rate (Admit)  78 bpm    Heart Rate (Exercise)  94 bpm    Heart Rate (Exit)  69 bpm    Oxygen Saturation (Admit)  98 %    Oxygen Saturation (Exercise)  93 %    Oxygen Saturation (Exit)  96 %    Rating of Perceived Exertion (Exercise)  13    Perceived Dyspnea (Exercise)  13    Duration  Progress to 30 minutes of  aerobic without signs/symptoms of physical distress    Intensity  THRR New [id number]      Progression   Progression  Continue to progress workloads to maintain intensity without signs/symptoms of physical distress.      Resistance Training   Training Prescription  Yes    Weight   1    Reps  10-15      NuStep   Level  2    SPM  81    Minutes  20    METs  1.7      Arm Ergometer   Level  1    Watts  1    RPM  12    Minutes  15    METs  1      Home Exercise Plan   Plans to continue exercise at  Home (comment)    Frequency  Add 2 additional days to program exercise sessions.    Initial Home Exercises Provided  07/15/17       Social History   Tobacco Use  Smoking Status Never Smoker  Smokeless Tobacco Never Used    Goals Met:  Independence with exercise equipment Improved SOB with ADL's Using PLB without cueing & demonstrates good technique Exercise tolerated well No report of cardiac concerns or symptoms Strength training completed today  Goals Unmet:  Not Applicable  Comments: Check out [name]   Dr. Sinda [name] is Medical Director for [facility]  Pulmonary Rehab.

## 2017-07-22 ENCOUNTER — Encounter (HOSPITAL_COMMUNITY)
Admission: RE | Admit: 2017-07-22 | Discharge: 2017-07-22 | Disposition: A | Discharge status: Home / Self Care | Payer: PPO | Source: Ambulatory Visit | Attending: Internal Medicine | Admitting: Internal Medicine

## 2017-07-22 DIAGNOSIS — J849 Interstitial pulmonary disease, unspecified: Secondary | ICD-10-CM | POA: Diagnosis not present

## 2017-07-22 NOTE — Progress Notes (Signed)
Daily Session Note  Patient Details  Name: [name] MRN: [id number] Date of Birth: Mar 02, 1946 Referring Provider:     PULMONARY REHAB OTHER RESP ORIENTATION from 07/01/2017 in [place]  Referring Provider  Dr. Chase Caller       Encounter Date: 07/22/2017  Check In: Session Check In - 07/22/17 1056      Check-In   Location  [facility]    Staff Present  Diane Angelina Pih, MS, EP, [facility], Exercise Physiologist;Morine Kohlman Luther Parody, BS, EP, Exercise Physiologist    Supervising physician immediately available to respond to emergencies  See telemetry face sheet for immediately available MD    Medication changes reported      No    Fall or balance concerns reported     Yes    Comments  Has fallen twice in the past 12 months.     Warm-up and Cool-down  Performed as group-led Higher education careers adviser Performed  Yes    VAD Patient?  No      Pain Assessment   Currently in Pain?  No/denies    Pain Score  0-No pain    Multiple Pain Sites  No       Capillary Blood Glucose: No results found for this or any previous visit (from the past 24 hour(s)).    Social History   Tobacco Use  Smoking Status Never Smoker  Smokeless Tobacco Never Used    Goals Met:  Independence with exercise equipment Improved SOB with ADL's Using PLB without cueing & demonstrates good technique Exercise tolerated well No report of cardiac concerns or symptoms Strength training completed today  Goals Unmet:  Not Applicable  Comments: Check out 1145   Dr. Sinda Du is Medical Director for [facility].

## 2017-07-24 ENCOUNTER — Encounter (HOSPITAL_COMMUNITY)
Admission: RE | Admit: 2017-07-24 | Discharge: 2017-07-24 | Disposition: A | Discharge status: Home / Self Care | Payer: PPO | Source: Ambulatory Visit | Attending: Internal Medicine | Admitting: Internal Medicine

## 2017-07-24 DIAGNOSIS — J849 Interstitial pulmonary disease, unspecified: Secondary | ICD-10-CM

## 2017-07-24 NOTE — Progress Notes (Signed)
Daily Session Note  Patient Details  Name: [name] MRN: [id number] Date of Birth: 11-18-1945 Referring Provider:     PULMONARY REHAB OTHER RESP ORIENTATION from 07/01/2017 in [place]  Referring Provider  Dr. Chase Caller       Encounter Date: 07/24/2017  Check In: Session Check In - 07/24/17 1045      Check-In   Location  [facility]    Staff Present  Diane Angelina Pih, MS, EP, [facility], Exercise Physiologist;Gregory Luther Parody, BS, EP, Exercise Physiologist;Shelitha Magley Wynetta Emery, RN, BSN    Supervising physician immediately available to respond to emergencies  See telemetry face sheet for immediately available MD    Medication changes reported      No    Fall or balance concerns reported     Yes    Comments  Has fallen twice in the past 12 months.     Warm-up and Cool-down  Performed as group-led Higher education careers adviser Performed  Yes    VAD Patient?  No      Pain Assessment   Currently in Pain?  No/denies    Pain Score  0-No pain    Multiple Pain Sites  No       Capillary Blood Glucose: No results found for this or any previous visit (from the past 24 hour(s)).    Social History   Tobacco Use  Smoking Status Never Smoker  Smokeless Tobacco Never Used    Goals Met:  Proper associated with RPD/PD & O2 Sat Independence with exercise equipment Improved SOB with ADL's Using PLB without cueing & demonstrates good technique Exercise tolerated well No report of cardiac concerns or symptoms Strength training completed today  Goals Unmet:  Not Applicable  Comments: Check out 1145.   Dr. Sinda Du is Medical Director for [facility] Pulmonary Rehab.

## 2017-07-29 ENCOUNTER — Encounter (HOSPITAL_COMMUNITY)
Admission: RE | Admit: 2017-07-29 | Discharge: 2017-07-29 | Disposition: A | Discharge status: Home / Self Care | Payer: PPO | Source: Ambulatory Visit | Attending: Internal Medicine | Admitting: Internal Medicine

## 2017-07-29 DIAGNOSIS — E785 Hyperlipidemia, unspecified: Secondary | ICD-10-CM | POA: Insufficient documentation

## 2017-07-29 DIAGNOSIS — J849 Interstitial pulmonary disease, unspecified: Secondary | ICD-10-CM | POA: Diagnosis not present

## 2017-07-29 DIAGNOSIS — Z7984 Long term (current) use of oral hypoglycemic drugs: Secondary | ICD-10-CM | POA: Insufficient documentation

## 2017-07-29 DIAGNOSIS — M797 Fibromyalgia: Secondary | ICD-10-CM | POA: Insufficient documentation

## 2017-07-29 DIAGNOSIS — Z79899 Other long term (current) drug therapy: Secondary | ICD-10-CM | POA: Diagnosis not present

## 2017-07-29 DIAGNOSIS — Z7982 Long term (current) use of aspirin: Secondary | ICD-10-CM | POA: Insufficient documentation

## 2017-07-29 DIAGNOSIS — K219 Gastro-esophageal reflux disease without esophagitis: Secondary | ICD-10-CM | POA: Diagnosis not present

## 2017-07-29 DIAGNOSIS — G4730 Sleep apnea, unspecified: Secondary | ICD-10-CM | POA: Diagnosis not present

## 2017-07-29 DIAGNOSIS — E119 Type 2 diabetes mellitus without complications: Secondary | ICD-10-CM | POA: Diagnosis not present

## 2017-07-29 DIAGNOSIS — I10 Essential (primary) hypertension: Secondary | ICD-10-CM | POA: Insufficient documentation

## 2017-07-29 NOTE — Progress Notes (Signed)
Daily Session Note  Patient Details  Name: TEREKA THORLEY MRN: [id number] Date of Birth: [birth year] Referring Provider:     PULMONARY REHAB OTHER RESP ORIENTATION from 07/01/2017 in [place]  Referring Provider  Dr. Chase Caller       Encounter Date: 07/29/2017  Check In: Session Check In - 07/29/17 1125      Check-In   Location  [facility]    Staff Present  Mekel Haverstock Angelina Pih, MS, EP, [facility], Exercise Physiologist;Gregory Luther Parody, BS, EP, Exercise Physiologist    Supervising physician immediately available to respond to emergencies  See telemetry face sheet for immediately available MD    Medication changes reported      No    Fall or balance concerns reported     Yes    Warm-up and Cool-down  Performed as group-led instruction    Resistance Training Performed  Yes    VAD Patient?  No      Pain Assessment   Currently in Pain?  No/denies    Pain Score  0-No pain    Multiple Pain Sites  No       Capillary Blood Glucose: No results found for this or any previous visit (from the past 24 hour(s)).    Social History   Tobacco Use  Smoking Status Never Smoker  Smokeless Tobacco Never Used    Goals Met:  Proper associated with RPD/PD & O2 Sat Independence with exercise equipment Using PLB without cueing & demonstrates good technique Exercise tolerated well No report of cardiac concerns or symptoms Strength training completed today  Goals Unmet:  Not Applicable  Comments: Check out: [place]   Dr. Sinda Du is Medical Director for [facility] Pulmonary Rehab.

## 2017-07-31 ENCOUNTER — Encounter (HOSPITAL_COMMUNITY)
Admission: RE | Admit: 2017-07-31 | Discharge: 2017-07-31 | Disposition: A | Discharge status: Home / Self Care | Payer: PPO | Source: Ambulatory Visit | Attending: Internal Medicine | Admitting: Internal Medicine

## 2017-07-31 DIAGNOSIS — J849 Interstitial pulmonary disease, unspecified: Secondary | ICD-10-CM | POA: Diagnosis not present

## 2017-07-31 NOTE — Progress Notes (Signed)
Daily Session Note  Patient Details  Name: [name] MRN: [id number] Date of Birth: 01/31/1946 Referring Provider:     PULMONARY REHAB OTHER RESP ORIENTATION from 07/01/2017 in [place] [facility]  Referring Provider  Dr. Ramaswamy       Encounter Date: 07/31/2017  Check In: Session Check In - 07/31/17 1045      Check-In   Location  [facility]    Staff Present  Diane Coad, MS, EP, [facility], Exercise Physiologist;Gregory Cowan, BS, EP, Exercise Physiologist;Maiyah Goyne, RN, BSN    Supervising physician immediately available to respond to emergencies  See telemetry face sheet for immediately available MD    Medication changes reported      No    Fall or balance concerns reported     Yes    Comments  Has fallen twice in the past 12 months.     Warm-up and Cool-down  Performed as group-led instruction    Resistance Training Performed  Yes    VAD Patient?  No      Pain Assessment   Currently in Pain?  No/denies    Pain Score  0-No pain    Multiple Pain Sites  No       Capillary Blood Glucose: No results found for this or any previous visit (from the past 24 hour(s)).    Social History   Tobacco Use  Smoking Status Never Smoker  Smokeless Tobacco Never Used    Goals Met:  Proper associated with RPD/PD & O2 Sat Independence with exercise equipment Improved SOB with ADL's Using PLB without cueing & demonstrates good technique Exercise tolerated well No report of cardiac concerns or symptoms Strength training completed today  Goals Unmet:  Not Applicable  Comments: Check out 1145.   Dr. Edward Hawkins is Medical Director for [facility]. 

## 2017-07-31 NOTE — Progress Notes (Signed)
Daily Session Note  Patient Details  Name: [name] MRN: [id number] Date of Birth: 08-23-1945 Referring Provider:     PULMONARY REHAB OTHER RESP ORIENTATION from 07/01/2017 in [place]  Referring Provider  Dr. Chase Caller       Encounter Date: 07/31/2017  Check In: Session Check In - 07/31/17 1045      Check-In   Location  [facility]    Staff Present  Diane Angelina Pih, MS, EP, [facility], Exercise Physiologist;Gregory Luther Parody, BS, EP, Exercise Physiologist;Anabell Swint Wynetta Emery, RN, BSN    Supervising physician immediately available to respond to emergencies  See telemetry face sheet for immediately available MD    Medication changes reported      No    Fall or balance concerns reported     Yes    Comments  Has fallen twice in the past 12 months.     Warm-up and Cool-down  Performed as group-led Higher education careers adviser Performed  Yes    VAD Patient?  No      Pain Assessment   Currently in Pain?  No/denies    Pain Score  0-No pain    Multiple Pain Sites  No       Capillary Blood Glucose: No results found for this or any previous visit (from the past 24 hour(s)).    Social History   Tobacco Use  Smoking Status Never Smoker  Smokeless Tobacco Never Used    Goals Met:  Proper associated with RPD/PD & O2 Sat Independence with exercise equipment Improved SOB with ADL's Using PLB without cueing & demonstrates good technique Exercise tolerated well No report of cardiac concerns or symptoms Strength training completed today  Goals Unmet:  Not Applicable  Comments: Check out 1145.   Dr. Sinda Du is Medical Director for [facility].

## 2017-07-31 NOTE — Progress Notes (Signed)
Pulmonary Individual Treatment Plan  Patient Details  Name: [name] MRN: [id number] Date of Birth: [birth year] Referring Provider:     PULMONARY REHAB OTHER RESP ORIENTATION from 07/01/2017 in [place]  Referring Provider  Dr. Chase Caller       Initial Encounter Date:    [place] from 07/01/2017 in [place]  Date  07/01/17  Referring Provider  Dr. Chase Caller       Visit Diagnosis: ILD (interstitial lung disease) ([place])  Patient's Home Medications on Admission:   Current Outpatient Medications:  .  ACCU-CHEK AVIVA PLUS test strip, Apply 1 strip topically daily. for testing, Disp: , Rfl: 1 .  ACCU-CHEK SOFTCLIX LANCETS lancets, TEST DAILY E11.9, Disp: , Rfl: 1 .  APPLE CIDER VINEGAR PO, Take by mouth daily. , Disp: , Rfl:  .  aspirin EC 81 MG tablet, Take 81 mg by mouth daily., Disp: , Rfl:  .  Calcium Carb-Cholecalciferol (CALCIUM+D3) 600-800 MG-UNIT TABS, Take 1 tablet by mouth daily., Disp: , Rfl:  .  cetirizine (ZYRTEC) 10 MG tablet, Take 10 mg by mouth daily., Disp: , Rfl: 0 .  co-enzyme Q-10 50 MG capsule, Take 100 mg by mouth daily., Disp: , Rfl:  .  [employer] NATURAL LUTEIN EYE HEALTH PO, Take 1 capsule by mouth daily. , Disp: , Rfl:  .  diclofenac sodium (VOLTAREN) 1 % GEL, Apply 1 application topically 2 (two) times daily., Disp: , Rfl:  .  fluticasone (FLONASE) 50 MCG/ACT nasal spray, Place 2 sprays into both nostrils daily. , Disp: , Rfl:  .  gabapentin (NEURONTIN) 100 MG capsule, Take 100 mg by mouth 2 (two) times daily., Disp: , Rfl:  .  gabapentin (NEURONTIN) 300 MG capsule, Take by mouth daily. , Disp: , Rfl:  .  Ginger, Zingiber officinalis, (GINGER ROOT) 550 MG CAPS, Take 1 capsule by mouth daily., Disp: , Rfl:  .  glipiZIDE (GLUCOTROL XL) 10 MG 24 hr tablet, TAKE 1 TABLET BY MOUTH AFTER LUNCH AND SUPPER, Disp: , Rfl:  .  levocarnitine (CARNITOR) 250 MG capsule, Take 500 mg by mouth daily.,  Disp: , Rfl:  .  losartan-hydrochlorothiazide (HYZAAR) 100-25 MG tablet, TAKE (1) TABLET BY MOUTH ONCE DAILY., Disp: , Rfl:  .  metFORMIN (GLUCOPHAGE-XR) 500 MG 24 hr tablet, TAKE 2 TABLETS TWICE DAILY AS DIRECTED., Disp: , Rfl:  .  Misc Natural Products (TART CHERRY ADVANCED) CAPS, Take by mouth., Disp: , Rfl:  .  Misc. Devices MISC, Avivia Plus lancets and strips. Tests daily  DX E11.9, Disp: , Rfl:  .  Nutritional Supplements (EQ ESTROBLEND MENOPAUSE) TABS, Take 1 tablet by mouth daily., Disp: , Rfl:  .  omega-3 acid ethyl esters (LOVAZA) 1 g capsule, Take 1 g by mouth daily., Disp: , Rfl:  .  pregabalin (LYRICA) 50 MG capsule, Take 50 mg by mouth 2 (two) times daily. , Disp: , Rfl:  .  pyridostigmine (MESTINON) 60 MG tablet, Try to take 1 tab 4 times daily, Disp: 120 tablet, Rfl: 6 .  rosuvastatin (CRESTOR) 5 MG tablet, Take 5 mg by mouth daily at 6 PM., Disp: , Rfl:  .  sitaGLIPtin (JANUVIA) 100 MG tablet, TAKE (1) TABLET BY MOUTH ONCE DAILY., Disp: , Rfl:  .  Turmeric Curcumin 500 MG CAPS, Take 1 capsule by mouth daily., Disp: , Rfl:   Past Medical History: Past Medical History:  Diagnosis Date  . Arthritis   . Bursitis   . Diabetes  mellitus without complication ([place])   . Fibromyalgia   . GERD (gastroesophageal reflux disease)   . Hyperlipemia   . Hypertension   . Sinusitis   . Sleep apnea    uses a cpap  . Sleep apnea   . Wears glasses     Tobacco Use: Social History   Tobacco Use  Smoking Status Never Smoker  Smokeless Tobacco Never Used    Labs: Recent Review Scientist, physiological    Labs for ITP Cardiac and Pulmonary Rehab Latest Ref Rng & Units 01/07/2011   TCO2 0 - 100 mmol/L 25      Capillary Blood Glucose: Lab Results  Component Value Date   GLUCAP 140 (H) 09/09/2012     Pulmonary Assessment Scores: Pulmonary Assessment Scores    Row Name 07/01/17 1540         ADL UCSD   ADL Phase  Entry     SOB Score total  33     Rest  1     Walk  10     Stairs   4     Bath  0     Dress  0     Shop  0       CAT Score   CAT Score  11       mMRC Score   mMRC Score  3        Pulmonary Function Assessment: Pulmonary Function Assessment - 07/01/17 1539      Pulmonary Function Tests   FVC%  93 %    FEV1%  110 %    FEV1/FVC Ratio  117    RV%  35 %    DLCO%  71 %      Breath   Bilateral Breath Sounds  Clear    Shortness of Breath  Yes       Exercise Target Goals:    Exercise Program Goal: Individual exercise prescription set using results from initial 6 min walk test and THRR while considering  patient's activity barriers and safety.   Exercise Prescription Goal: Initial exercise prescription builds to 30-45 minutes a day of aerobic activity, 2-3 days per week.  Home exercise guidelines will be given to patient during program as part of exercise prescription that the participant will acknowledge.  Activity Barriers & Risk Stratification: Activity Barriers & Cardiac Risk Stratification - 07/01/17 1308      Activity Barriers & Cardiac Risk Stratification   Activity Barriers  Neck/Spine Problems;History of Falls;Deconditioning;Balance Concerns;Assistive Device;Shortness of Breath    Cardiac Risk Stratification  High       6 Minute Walk: 6 Minute Walk    Row Name 07/01/17 1411         6 Minute Walk   Phase  Initial     Distance  950 feet     Distance % Change  0 %     Distance Feet Change  0 ft     Walk Time  6 minutes     # of Rest Breaks  0     MPH  1.79     METS  2.37     RPE  11     Perceived Dyspnea   13     VO2 Peak  8.17     Symptoms  No     Resting HR  82 bpm     Resting BP  132/64     Resting Oxygen Saturation   97 %     Exercise Oxygen  Saturation  during 6 min walk  97 %     Max Ex. HR  112 bpm     Max Ex. BP  180/76     2 Minute Post BP  136/64        Oxygen Initial Assessment: Oxygen Initial Assessment - 07/01/17 1556      Home Oxygen   Home Oxygen Device  None    Sleep Oxygen Prescription  None     Home Exercise Oxygen Prescription  None    Home at Rest Exercise Oxygen Prescription  None      Initial 6 min Walk   Oxygen Used  None      Program Oxygen Prescription   Program Oxygen Prescription  None       Oxygen Re-Evaluation: Oxygen Re-Evaluation    Row Name 07/31/17 1335             Program Oxygen Prescription   Program Oxygen Prescription  None         Home Oxygen   Home Oxygen Device  None       Sleep Oxygen Prescription  None       Home Exercise Oxygen Prescription  None       Home at Rest Exercise Oxygen Prescription  None         Goals/Expected Outcomes   Short Term Goals  To learn and understand importance of monitoring SPO2 with pulse oximeter and demonstrate accurate use of the pulse oximeter.;To learn and understand importance of maintaining oxygen saturations>88%;To learn and demonstrate proper pursed lip breathing techniques or other breathing techniques.       Long  Term Goals  Verbalizes importance of monitoring SPO2 with pulse oximeter and return demonstration;Maintenance of O2 saturations>88%;Exhibits proper breathing techniques, such as pursed lip breathing or other method taught during program session       Comments  Patient demonstrates proper use of pulse oximeter with return demonstration and she verbalizes the importance of maintaining O2 saturations >88%. She also demonstrates proper pursed lip breathing during the program.        Goals/Expected Outcomes  Patient will continues to meet her short and long term goals.           Oxygen Discharge (Final Oxygen Re-Evaluation): Oxygen Re-Evaluation - 07/31/17 1335      Program Oxygen Prescription   Program Oxygen Prescription  None      Home Oxygen   Home Oxygen Device  None    Sleep Oxygen Prescription  None    Home Exercise Oxygen Prescription  None    Home at Rest Exercise Oxygen Prescription  None      Goals/Expected Outcomes   Short Term Goals  To learn and understand importance of  monitoring SPO2 with pulse oximeter and demonstrate accurate use of the pulse oximeter.;To learn and understand importance of maintaining oxygen saturations>88%;To learn and demonstrate proper pursed lip breathing techniques or other breathing techniques.    Long  Term Goals  Verbalizes importance of monitoring SPO2 with pulse oximeter and return demonstration;Maintenance of O2 saturations>88%;Exhibits proper breathing techniques, such as pursed lip breathing or other method taught during program session    Comments  Patient demonstrates proper use of pulse oximeter with return demonstration and she verbalizes the importance of maintaining O2 saturations >88%. She also demonstrates proper pursed lip breathing during the program.     Goals/Expected Outcomes  Patient will continues to meet her short and long term goals.  Initial Exercise Prescription: Initial Exercise Prescription - 07/01/17 1300      Date of Initial Exercise RX and Referring Provider   Date  07/01/17    Referring Provider  Dr. Chase Caller       NuStep   Level  2    SPM  66    Minutes  15    METs  1.4      Arm Ergometer   Level  1.2    Watts  12    RPM  12    Minutes  20    METs  1.9      Prescription Details   Frequency (times per week)  3    Duration  Progress to 30 minutes of continuous aerobic without signs/symptoms of physical distress      Intensity   THRR 40-80% of Max Heartrate  [phone]    Ratings of Perceived Exertion  11-13    Perceived Dyspnea  0-4      Progression   Progression  Continue progressive overload as per policy without signs/symptoms or physical distress.      Resistance Training   Training Prescription  Yes    Weight  1    Reps  10-15       Perform Capillary Blood Glucose checks as needed.  Exercise Prescription Changes: Exercise Prescription Changes    Row Name 07/17/17 0700 07/25/17 1400           Response to Exercise   Blood Pressure (Admit)  120/62  116/50       Blood Pressure (Exercise)  180/80  180/64      Blood Pressure (Exit)  148/82  100/50      Heart Rate (Admit)  78 bpm  80 bpm      Heart Rate (Exercise)  94 bpm  95 bpm      Heart Rate (Exit)  69 bpm  77 bpm      Oxygen Saturation (Admit)  98 %  98 %      Oxygen Saturation (Exercise)  93 %  97 %      Oxygen Saturation (Exit)  96 %  96 %      Rating of Perceived Exertion (Exercise)  13  13      Perceived Dyspnea (Exercise)  13  13      Duration  Progress to 30 minutes of  aerobic without signs/symptoms of physical distress  Progress to 30 minutes of  aerobic without signs/symptoms of physical distress      Intensity  THRR New [phone]  THRR New [id number]        Progression   Progression  Continue to progress workloads to maintain intensity without signs/symptoms of physical distress.  Continue to progress workloads to maintain intensity without signs/symptoms of physical distress.        Resistance Training   Training Prescription  Yes  Yes      Weight  1  1      Reps  10-15  10-15        NuStep   Level  2  2      SPM  81  97      Minutes  20  20      METs  1.7  1.7        Arm Ergometer   Level  1  1.3      Watts  1  3      RPM  12  12  Minutes  15  15      METs  1  1.3        Home Exercise Plan   Plans to continue exercise at  Home (comment)  Home (comment)      Frequency  Add 2 additional days to program exercise sessions.  Add 2 additional days to program exercise sessions.      Initial Home Exercises Provided  07/15/17  07/15/17         Exercise Comments: Exercise Comments    Row Name 07/17/17 0751 07/25/17 1429         Exercise Comments  Patient has just started PR and will slowly be progressed in time.   Patient is doing very well in PR even on only her 5th session she has progressed on the arm ergometer to level 1.3. Patient has not been in the program long enough to see progress however she will continued to be monitored.          Exercise Goals  and Review: Exercise Goals    Row Name 07/01/17 1313             Exercise Goals   Increase Physical Activity  Yes       Intervention  Provide advice, education, support and counseling about physical activity/exercise needs.;Develop an individualized exercise prescription for aerobic and resistive training based on initial evaluation findings, risk stratification, comorbidities and participant's personal goals.       Expected Outcomes  Short Term: Attend rehab on a regular basis to increase amount of physical activity.       Increase Strength and Stamina  Yes       Intervention  Develop an individualized exercise prescription for aerobic and resistive training based on initial evaluation findings, risk stratification, comorbidities and participant's personal goals.;Provide advice, education, support and counseling about physical activity/exercise needs.       Expected Outcomes  Short Term: Increase workloads from initial exercise prescription for resistance, speed, and METs.       Able to understand and use rate of perceived exertion (RPE) scale  Yes       Intervention  Provide education and explanation on how to use RPE scale       Expected Outcomes  Short Term: Able to use RPE daily in rehab to express subjective intensity level;Long Term:  Able to use RPE to guide intensity level when exercising independently       Able to understand and use Dyspnea scale  Yes       Intervention  Provide education and explanation on how to use Dyspnea scale       Expected Outcomes  Short Term: Able to use Dyspnea scale daily in rehab to express subjective sense of shortness of breath during exertion;Long Term: Able to use Dyspnea scale to guide intensity level when exercising independently       Knowledge and understanding of Target Heart Rate Range (THRR)  Yes       Intervention  Provide education and explanation of THRR including how the numbers were predicted and where they are located for reference        Expected Outcomes  Short Term: Able to state/look up THRR;Long Term: Able to use THRR to govern intensity when exercising independently;Short Term: Able to use daily as guideline for intensity in rehab       Able to check pulse independently  Yes       Intervention  Provide education and demonstration on how  to check pulse in carotid and radial arteries.;Review the importance of being able to check your own pulse for safety during independent exercise       Expected Outcomes  Short Term: Able to explain why pulse checking is important during independent exercise;Long Term: Able to check pulse independently and accurately       Understanding of Exercise Prescription  Yes       Intervention  Provide education, explanation, and written materials on patient's individual exercise prescription       Expected Outcomes  Short Term: Able to explain program exercise prescription;Long Term: Able to explain home exercise prescription to exercise independently          Exercise Goals Re-Evaluation : Exercise Goals Re-Evaluation    Row Name 07/25/17 1428             Exercise Goal Re-Evaluation   Exercise Goals Review  Increase Physical Activity;Increase Strength and Stamina;Able to understand and use Dyspnea scale;Knowledge and understanding of Target Heart Rate Range (THRR);Able to understand and use rate of perceived exertion (RPE) scale;Able to check pulse independently       Comments  Patient is doing very well in PR even on only her 5th session she has progressed on the arm ergometer to level 1.3. Patient has not been in the program long enough to see progress however she will continued to be monitored.        Expected Outcomes  Patient wishes to have better mobility and to lose weight.           Discharge Exercise Prescription (Final Exercise Prescription Changes): Exercise Prescription Changes - 07/25/17 1400      Response to Exercise   Blood Pressure (Admit)  116/50    Blood Pressure  (Exercise)  180/64    Blood Pressure (Exit)  100/50    Heart Rate (Admit)  80 bpm    Heart Rate (Exercise)  95 bpm    Heart Rate (Exit)  77 bpm    Oxygen Saturation (Admit)  98 %    Oxygen Saturation (Exercise)  97 %    Oxygen Saturation (Exit)  96 %    Rating of Perceived Exertion (Exercise)  13    Perceived Dyspnea (Exercise)  13    Duration  Progress to 30 minutes of  aerobic without signs/symptoms of physical distress    Intensity  THRR New [id number]      Progression   Progression  Continue to progress workloads to maintain intensity without signs/symptoms of physical distress.      Resistance Training   Training Prescription  Yes    Weight  1    Reps  10-15      NuStep   Level  2    SPM  97    Minutes  20    METs  1.7      Arm Ergometer   Level  1.3    Watts  3    RPM  12    Minutes  15    METs  1.3      Home Exercise Plan   Plans to continue exercise at  Home (comment)    Frequency  Add 2 additional days to program exercise sessions.    Initial Home Exercises Provided  07/15/17       Nutrition:  Target Goals: Understanding of nutrition guidelines, daily intake of sodium 1500mg , cholesterol 200mg , calories 30% from fat and 7% or less from saturated fats, daily to have 5 or  more servings of fruits and vegetables.  Biometrics: Pre Biometrics - 07/01/17 1313      Pre Biometrics   Height  5\' 5"  (1.651 m)    Weight  214 lb 1.6 oz (97.1 kg)    Waist Circumference  48.5 inches    Hip Circumference  47 inches    Waist to Hip Ratio  1.03 %    BMI (Calculated)  35.63    Triceps Skinfold  26 mm    % Body Fat  47.6 %    Grip Strength  28.93 kg    Flexibility  0 in    Single Leg Stand  0 seconds        Nutrition Therapy Plan and Nutrition Goals: Nutrition Therapy & Goals - 07/01/17 1559      Personal Nutrition Goals   Personal Goal #2  Patient eats a regular diet.     Additional Goals?  No      Intervention Plan   Expected Outcomes  Short Term Goal:  A plan has been developed with personal nutrition goals set during dietitian appointment.;Long Term Goal: Adherence to prescribed nutrition plan.       Nutrition Assessments: Nutrition Assessments - 07/01/17 1600      MEDFICTS Scores   Pre Score  62       Nutrition Goals Re-Evaluation:   Nutrition Goals Discharge (Final Nutrition Goals Re-Evaluation):   Psychosocial: Target Goals: Acknowledge presence or absence of significant depression and/or stress, maximize coping skills, provide positive support system. Participant is able to verbalize types and ability to use techniques and skills needed for reducing stress and depression.  Initial Review & Psychosocial Screening: Initial Psych Review & Screening - 07/01/17 1603      Initial Review   Current issues with  Current Depression;Current Sleep Concerns      Family Dynamics   Good Support System?  Yes      Barriers   Psychosocial barriers to participate in program  Psychosocial barriers identified (see note) QOL score indicated depression at 15.56 overall.      Screening Interventions   Interventions  Encouraged to exercise;Provide feedback about the scores to participant    Expected Outcomes  Short Term goal: Identification and review with participant of any Quality of Life or Depression concerns found by scoring the questionnaire.;Long Term goal: The participant improves quality of Life and PHQ9 Scores as seen by post scores and/or verbalization of changes       Quality of Life Scores: Quality of Life - 07/01/17 1314      Quality of Life Scores   Health/Function Pre  13.27 %    Socioeconomic Pre  11.33 %    Psych/Spiritual Pre  18.07 %    Family Pre  24 %    GLOBAL Pre  15.56 %      Scores of 19 and below usually indicate a poorer quality of life in these areas.  A difference of  2-3 points is a clinically meaningful difference.  A difference of 2-3 points in the total score of the Quality of Life Index has been  associated with significant improvement in overall quality of life, self-image, physical symptoms, and general health in studies assessing change in quality of life.   PHQ-9: Recent Review Flowsheet Data    Depression screen [facility] 2/9 07/01/2017   Decreased Interest 0   Down, Depressed, Hopeless 0   PHQ - 2 Score 0   Altered sleeping 2   Tired, decreased energy  3   Change in appetite 1   Feeling bad or failure about yourself  0   Trouble concentrating 0   Moving slowly or fidgety/restless 0   Suicidal thoughts 0   PHQ-9 Score 6   Difficult doing work/chores Somewhat difficult     Interpretation of Total Score  Total Score Depression Severity:  1-4 = Minimal depression, 5-9 = Mild depression, 10-14 = Moderate depression, 15-19 = Moderately severe depression, 20-27 = Severe depression   Psychosocial Evaluation and Intervention: Psychosocial Evaluation - 07/01/17 1604      Psychosocial Evaluation & Interventions   Interventions  Encouraged to exercise with the program and follow exercise prescription    Comments  Patient does not want to be referred to a therapist.     Continue Psychosocial Services   Follow up required by staff       Psychosocial Re-Evaluation: Psychosocial Re-Evaluation    [place] Name 07/31/17 1342             Psychosocial Re-Evaluation   Current issues with  Current Depression;Current Sleep Concerns       Comments  Patient's initial QOL score was 15.56 and her PHQ-9 score was 6 indicating depression. She says her does not feel she needs treatment at this time.        Expected Outcomes  Patient's QOL and PHQ-9 scores will improve or remain the same at discharge and she will have no additional psychosocial issues identified.        Interventions  Stress management education;Encouraged to attend Cardiac Rehabilitation for the exercise;Relaxation education       Continue Psychosocial Services   No Follow up required          Psychosocial Discharge (Final  Psychosocial Re-Evaluation): Psychosocial Re-Evaluation - 07/31/17 1342      Psychosocial Re-Evaluation   Current issues with  Current Depression;Current Sleep Concerns    Comments  Patient's initial QOL score was 15.56 and her PHQ-9 score was 6 indicating depression. She says her does not feel she needs treatment at this time.     Expected Outcomes  Patient's QOL and PHQ-9 scores will improve or remain the same at discharge and she will have no additional psychosocial issues identified.     Interventions  Stress management education;Encouraged to attend Cardiac Rehabilitation for the exercise;Relaxation education    Continue Psychosocial Services   No Follow up required        Education: Education Goals: Education classes will be provided on a weekly basis, covering required topics. Participant will state understanding/return demonstration of topics presented.  Learning Barriers/Preferences: Learning Barriers/Preferences - 07/01/17 1518      Learning Barriers/Preferences   Learning Barriers  None    Learning Preferences  Verbal Instruction;Skilled Demonstration;Group Instruction       Education Topics: How Lungs Work and Diseases: - Discuss the anatomy of the lungs and diseases that can affect the lungs, such as COPD.   Exercise: -Discuss the importance of exercise, FITT principles of exercise, normal and abnormal responses to exercise, and how to exercise safely.   Environmental Irritants: -Discuss types of environmental irritants and how to limit exposure to environmental irritants.   Meds/Inhalers and oxygen: - Discuss respiratory medications, definition of an inhaler and oxygen, and the proper way to use an inhaler and oxygen.   Energy Saving Techniques: - Discuss methods to conserve energy and decrease shortness of breath when performing activities of daily living.    Bronchial Hygiene / Breathing Techniques: - Discuss  breathing mechanics, pursed-lip breathing  technique,  proper posture, effective ways to clear airways, and other functional breathing techniques   Cleaning Equipment: - Provides group verbal and written instruction about the health risks of elevated stress, cause of high stress, and healthy ways to reduce stress.   Nutrition I: Fats: - Discuss the types of cholesterol, what cholesterol does to the body, and how cholesterol levels can be controlled.   PULMONARY REHAB OTHER RESPIRATORY from 07/24/2017 in [place]  Date  07/17/17  Educator  DJ  Instruction Review Code  2- Demonstrated Understanding      Nutrition II: Labels: -Discuss the different components of food labels and how to read food labels.   PULMONARY REHAB OTHER RESPIRATORY from 07/24/2017 in [place]  Date  07/24/17  Educator  DJ  Instruction Review Code  2- Demonstrated Understanding      Respiratory Infections: - Discuss the signs and symptoms of respiratory infections, ways to prevent respiratory infections, and the importance of seeking medical treatment when having a respiratory infection.   Stress I: Signs and Symptoms: - Discuss the causes of stress, how stress may lead to anxiety and depression, and ways to limit stress.   Stress II: Relaxation: -Discuss relaxation techniques to limit stress.   Oxygen for Home/Travel: - Discuss how to prepare for travel when on oxygen and proper ways to transport and store oxygen to ensure safety.   Knowledge Questionnaire Score: Knowledge Questionnaire Score - 07/01/17 1539      Knowledge Questionnaire Score   Pre Score  15/18       Core Components/Risk Factors/Patient Goals at Admission: Personal Goals and Risk Factors at Admission - 07/01/17 1600      Core Components/Risk Factors/Patient Goals on Admission    Weight Management  Yes    Intervention  Weight Management/Obesity: Establish reasonable short term and long term weight goals.;Obesity: Provide  education and appropriate resources to help participant work on and attain dietary goals.    Admit Weight  214 lb 1.6 oz (97.1 kg)    Goal Weight: Short Term  204 lb 1.6 oz (92.6 kg)    Goal Weight: Long Term  194 lb 1.6 oz (88 kg)    Expected Outcomes  Short Term: Continue to assess and modify interventions until short term weight is achieved;Long Term: Adherence to nutrition and physical activity/exercise program aimed toward attainment of established weight goal    Personal Goal Other  Yes    Personal Goal  Better mobility, lose 25-30lbs    Intervention  Attend PR 2 x week and supplement at home exercise 3 x week.    Expected Outcomes  Reach personal goals.       Core Components/Risk Factors/Patient Goals Review:  Goals and Risk Factor Review    Row Name 07/31/17 1337             Core Components/Risk Factors/Patient Goals Review   Personal Goals Review  Weight Management/Obesity;Increase knowledge of respiratory medications and ability to use respiratory devices properly.;Improve shortness of breath with ADL's Better mobility; lose weight 25-30 lbs;  Do yard work.        Review  Patient has completed 7 sessions losing 3.5 lbs. She is doing well in the program with some progression. She says it is too soon to tell if the program is making a difference. She hopes to meet her goals as she continues the program. Will continue to monitor for progress.  Expected Outcomes  Patient will continue to attend sessions and complete the program.           Core Components/Risk Factors/Patient Goals at Discharge (Final Review):  Goals and Risk Factor Review - 07/31/17 1337      Core Components/Risk Factors/Patient Goals Review   Personal Goals Review  Weight Management/Obesity;Increase knowledge of respiratory medications and ability to use respiratory devices properly.;Improve shortness of breath with ADL's Better mobility; lose weight 25-30 lbs;  Do yard work.     Review  Patient has  completed 7 sessions losing 3.5 lbs. She is doing well in the program with some progression. She says it is too soon to tell if the program is making a difference. She hopes to meet her goals as she continues the program. Will continue to monitor for progress.     Expected Outcomes  Patient will continue to attend sessions and complete the program.        ITP Comments: ITP Comments    Row Name 07/01/17 1543 07/03/17 1336         ITP Comments  Mrs. 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 She has also been diagnosed with Myasthenia Gravis She is depressed according to her QOL scores. We will continue to address this area as she comes through our program.   Patient new to program. Plans to start Tuesday 07/08/17. Will continue to monitor for progress.          Comments: ITP 30 Day REVIEW Pt is making expected progress toward pulmonary rehab goals after completing 7 sessions. Recommend continued exercise, life style modification, education, and utilization of breathing techniques to increase stamina and strength and decrease shortness of breath with exertion.

## 2017-08-05 ENCOUNTER — Encounter (HOSPITAL_COMMUNITY)
Admission: RE | Admit: 2017-08-05 | Discharge: 2017-08-05 | Disposition: A | Discharge status: Home / Self Care | Payer: PPO | Source: Ambulatory Visit | Attending: Internal Medicine | Admitting: Internal Medicine

## 2017-08-05 DIAGNOSIS — J849 Interstitial pulmonary disease, unspecified: Secondary | ICD-10-CM

## 2017-08-05 NOTE — Progress Notes (Signed)
Daily Session Note  Patient Details  Name: [name] MRN: [id number] Date of Birth: 09-Oct-1945 Referring Provider:     PULMONARY REHAB OTHER RESP ORIENTATION from 07/01/2017 in [facility]  Referring Provider  Dr. Chase Caller       Encounter Date: 08/05/2017  Check In: Session Check In - 08/05/17 1106      Check-In   Location  [facility]    Staff Present  Diane Angelina Pih, MS, EP, [facility], Exercise Physiologist;Kermit Arnette Luther Parody, BS, EP, Exercise Physiologist    Supervising physician immediately available to respond to emergencies  See telemetry face sheet for immediately available MD    Medication changes reported      No    Fall or balance concerns reported     Yes    Comments  Has fallen twice in the past 12 months.     Warm-up and Cool-down  Performed as group-led Higher education careers adviser Performed  Yes    VAD Patient?  No      Pain Assessment   Currently in Pain?  No/denies    Pain Score  0-No pain    Multiple Pain Sites  No       Capillary Blood Glucose: No results found for this or any previous visit (from the past 24 hour(s)).    Social History   Tobacco Use  Smoking Status Never Smoker  Smokeless Tobacco Never Used    Goals Met:  Independence with exercise equipment Improved SOB with ADL's Using PLB without cueing & demonstrates good technique Exercise tolerated well No report of cardiac concerns or symptoms Strength training completed today  Goals Unmet:  Not Applicable  Comments: Check out 1145   Dr. Sinda Du is Medical Director for [facility].

## 2017-08-07 ENCOUNTER — Encounter (HOSPITAL_COMMUNITY)
Admission: RE | Admit: 2017-08-07 | Discharge: 2017-08-07 | Disposition: A | Discharge status: Home / Self Care | Payer: PPO | Source: Ambulatory Visit | Attending: Internal Medicine | Admitting: Internal Medicine

## 2017-08-07 DIAGNOSIS — J849 Interstitial pulmonary disease, unspecified: Secondary | ICD-10-CM | POA: Diagnosis not present

## 2017-08-07 NOTE — Progress Notes (Signed)
Daily Session Note  Patient Details  Name: [name] MRN: [id number] Date of Birth: March 21, 1946 Referring Provider:     PULMONARY REHAB OTHER RESP ORIENTATION from 07/01/2017 in [place]  Referring Provider  Dr. Chase Caller       Encounter Date: 08/07/2017  Check In: Session Check In - 08/07/17 1045      Check-In   Location  [facility]    Staff Present  Suzanne Boron, BS, EP, Exercise Physiologist;Avamarie Crossley Wynetta Emery, RN, BSN    Supervising physician immediately available to respond to emergencies  See telemetry face sheet for immediately available MD    Medication changes reported      No    Fall or balance concerns reported     Yes    Comments  Has fallen twice in the past 12 months.     Warm-up and Cool-down  Performed as group-led Higher education careers adviser Performed  Yes    VAD Patient?  No      Pain Assessment   Currently in Pain?  No/denies    Pain Score  0-No pain    Multiple Pain Sites  No       Capillary Blood Glucose: No results found for this or any previous visit (from the past 24 hour(s)).    Social History   Tobacco Use  Smoking Status Never Smoker  Smokeless Tobacco Never Used    Goals Met:  Proper associated with RPD/PD & O2 Sat Independence with exercise equipment Improved SOB with ADL's Using PLB without cueing & demonstrates good technique Exercise tolerated well No report of cardiac concerns or symptoms Strength training completed today  Goals Unmet:  Not Applicable  Comments: Check out 1145.   Dr. Sinda Du is Medical Director for [facility].

## 2017-08-12 ENCOUNTER — Encounter (HOSPITAL_COMMUNITY)
Admission: RE | Admit: 2017-08-12 | Discharge: 2017-08-12 | Disposition: A | Discharge status: Home / Self Care | Payer: PPO | Source: Ambulatory Visit | Attending: Internal Medicine | Admitting: Internal Medicine

## 2017-08-12 DIAGNOSIS — J849 Interstitial pulmonary disease, unspecified: Secondary | ICD-10-CM

## 2017-08-12 NOTE — Progress Notes (Signed)
Daily Session Note  Patient Details  Name: [name] MRN: [id number] Date of Birth: 15-May-1945 Referring Provider:     PULMONARY REHAB OTHER RESP ORIENTATION from 07/01/2017 in [place]  Referring Provider  Dr. Chase Caller       Encounter Date: 08/12/2017  Check In: Session Check In - 08/12/17 1102      Check-In   Location  [facility]    Staff Present  Suzanne Boron, BS, EP, Exercise Physiologist;Diane Coad, MS, EP, [facility], Exercise Physiologist    Supervising physician immediately available to respond to emergencies  See telemetry face sheet for immediately available MD    Medication changes reported      No    Fall or balance concerns reported     Yes    Comments  Has fallen twice in the past 12 months.     Warm-up and Cool-down  Performed as group-led Higher education careers adviser Performed  Yes    VAD Patient?  No      Pain Assessment   Currently in Pain?  No/denies    Pain Score  0-No pain    Multiple Pain Sites  No       Capillary Blood Glucose: No results found for this or any previous visit (from the past 24 hour(s)).    Social History   Tobacco Use  Smoking Status Never Smoker  Smokeless Tobacco Never Used    Goals Met:  Independence with exercise equipment Improved SOB with ADL's Using PLB without cueing & demonstrates good technique Exercise tolerated well No report of cardiac concerns or symptoms Strength training completed today  Goals Unmet:  Not Applicable  Comments: Check out 1145   Dr. Sinda Du is Medical Director for [facility] Pulmonary Re[facility].

## 2017-08-14 ENCOUNTER — Encounter (HOSPITAL_COMMUNITY)
Admission: RE | Admit: 2017-08-14 | Discharge: 2017-08-14 | Disposition: A | Discharge status: Home / Self Care | Payer: PPO | Source: Ambulatory Visit | Attending: Internal Medicine | Admitting: Internal Medicine

## 2017-08-14 DIAGNOSIS — J849 Interstitial pulmonary disease, unspecified: Secondary | ICD-10-CM | POA: Diagnosis not present

## 2017-08-14 NOTE — Progress Notes (Signed)
Daily Session Note  Patient Details  Name: [name] MRN: [id number] Date of Birth: [birth year] Referring Provider:     PULMONARY REHAB OTHER RESP ORIENTATION from 07/01/2017 in [place]  Referring Provider  Dr. Chase Caller       Encounter Date: 08/14/2017  Check In: Session Check In - 08/14/17 1045      Check-In   Location  [facility]    Staff Present  Aundra Dubin, RN, BSN;Gregory Luther Parody, BS, EP, Exercise Physiologist    Supervising physician immediately available to respond to emergencies  See telemetry face sheet for immediately available MD    Medication changes reported      No    Fall or balance concerns reported     Yes    Comments  Has fallen twice in the past 12 months.     Warm-up and Cool-down  Performed as group-led Higher education careers adviser Performed  Yes    VAD Patient?  No      Pain Assessment   Currently in Pain?  No/denies    Pain Score  0-No pain    Multiple Pain Sites  No       Capillary Blood Glucose: No results found for this or any previous visit (from the past 24 hour(s)).    Social History   Tobacco Use  Smoking Status Never Smoker  Smokeless Tobacco Never Used    Goals Met:  Proper associated with RPD/PD & O2 Sat Independence with exercise equipment Improved SOB with ADL's Using PLB without cueing & demonstrates good technique Exercise tolerated well No report of cardiac concerns or symptoms Strength training completed today  Goals Unmet:  Not Applicable  Comments: Check out 1145.   Dr. Sinda Du is Medical Director for [facility].

## 2017-08-19 ENCOUNTER — Encounter (HOSPITAL_COMMUNITY)
Admission: RE | Admit: 2017-08-19 | Discharge: 2017-08-19 | Disposition: A | Discharge status: Home / Self Care | Payer: PPO | Source: Ambulatory Visit | Attending: Internal Medicine | Admitting: Internal Medicine

## 2017-08-19 ENCOUNTER — Ambulatory Visit (INDEPENDENT_AMBULATORY_CARE_PROVIDER_SITE_OTHER): Payer: PPO | Admitting: Neurology

## 2017-08-19 ENCOUNTER — Encounter: Payer: Self-pay | Admitting: Neurology

## 2017-08-19 VITALS — BP 105/60 | HR 64 | Ht 63.0 in | Wt 208.6 lb

## 2017-08-19 DIAGNOSIS — I10 Essential (primary) hypertension: Secondary | ICD-10-CM | POA: Diagnosis not present

## 2017-08-19 DIAGNOSIS — E1169 Type 2 diabetes mellitus with other specified complication: Secondary | ICD-10-CM | POA: Diagnosis not present

## 2017-08-19 DIAGNOSIS — E785 Hyperlipidemia, unspecified: Secondary | ICD-10-CM | POA: Diagnosis not present

## 2017-08-19 DIAGNOSIS — G7000 Myasthenia gravis without (acute) exacerbation: Secondary | ICD-10-CM

## 2017-08-19 DIAGNOSIS — J849 Interstitial pulmonary disease, unspecified: Secondary | ICD-10-CM | POA: Diagnosis not present

## 2017-08-19 DIAGNOSIS — E119 Type 2 diabetes mellitus without complications: Secondary | ICD-10-CM | POA: Diagnosis not present

## 2017-08-19 MED ORDER — PYRIDOSTIGMINE BROMIDE 60 MG PO TABS: ORAL_TABLET | ORAL | 6 refills | Status: DC

## 2017-08-19 NOTE — Patient Instructions (Signed)
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 I do not recommend any further changes at the present time. She was advised to avoid excessive fatigue and tiredness. She was given a refill for a year. She was advised to return for follow-up in the future only as necessary and no scheduled appointment was made.

## 2017-08-19 NOTE — Progress Notes (Signed)
Daily Session Note  Patient Details  Name: [name] MRN: [id number] Date of Birth: [birth year] Referring Provider:     PULMONARY REHAB OTHER RESP ORIENTATION from 07/01/2017 in [place]  Referring Provider  Dr. Chase Caller       Encounter Date: 08/19/2017  Check In: Session Check In - 08/19/17 1051      Check-In   Location  [facility]    Staff Present  Deeanna Beightol Angelina Pih, MS, EP, [facility], Exercise Physiologist;Gregory Luther Parody, BS, EP, Exercise Physiologist    Supervising physician immediately available to respond to emergencies  See telemetry face sheet for immediately available MD    Medication changes reported      No    Fall or balance concerns reported     Yes    Warm-up and Cool-down  Performed as group-led instruction    Resistance Training Performed  Yes    VAD Patient?  No      Pain Assessment   Currently in Pain?  No/denies    Pain Score  0-No pain    Multiple Pain Sites  No       Capillary Blood Glucose: No results found for this or any previous visit (from the past 24 hour(s)).    Social History   Tobacco Use  Smoking Status Never Smoker  Smokeless Tobacco Never Used    Goals Met:  Proper associated with RPD/PD & O2 Sat Independence with exercise equipment Improved SOB with ADL's Using PLB without cueing & demonstrates good technique Exercise tolerated well No report of cardiac concerns or symptoms Strength training completed today  Goals Unmet:  Not Applicable  Comments: Check out: [place]   Dr. Sinda Du is Medical Director for [facility] Pul[facility] Re[facility].

## 2017-08-19 NOTE — Progress Notes (Signed)
[facility]  PATIENT: Crystal EWTON DOB: 07-17-1945   REASON FOR VISIT: Follow-up for ocular myasthenia HISTORY FROM: Patient    HISTORY OF PRESENT ILLNESS: Initial Consult  03/07/16 PS;  [name] is [name] whose had droopy eyelids first started in December 2016 when [name] stated states that "I was drooping and [name] will barely open it. [name] was seen by primary physician who thought [name] had partial Bell's palsy. This did not get better and subsequently in June [name] saw ophthalmologist who noticed that [name] had some infection on the lower eyelid treated with antibiotics. Since then [name] started that [name] has intermittent opening of both eyelids left more than right. There are times when arises is completely shut and on other times [name] can see well. In fact on inquiry [name] states that a couple of occasion while driving [name] was seeing double the dividing lines of the road for crossing over. [name] is also noticed that [name] gets fatigued and tired very easily. Initially when this first began this was thought to be related to viral infection but this does not seem to be getting better. [name] denies any difficulty moving her eyes, any dysphagia, weakness in her arms or legs. [name] has no trouble getting out, chair or climbing up and down steps. [name] has a good appetite and [name] had not lost any weight. Review of electronic medical record shows that [name] had elevated acetylcholine receptor antibodies on 01/15/16 with acetylcholine binding antibody of 6.16, blocking antibody 31 and modulating antibody 48 all of which are abnormal. Patient has not been tried on Mestinon on other medications for myasthenia. [name] denies any history of headaches, strokes, TIAs or other neurologic problems. Update 1/16/2018PS : [name] returns for follow-up after last visit 2 months ago. [name] states [name] has noticed benefit on Mestinon and eyes do not droop as much and [name] is not had any episodes of diplopia.  [name] doesn't feel tired as much as well. However [name] was unable to tolerate 60 mg dose of Mestinon due to stomach cramps, nausea and is taking only 30 mg mostly 3 times a day and takes an occasional fourth dose if needed. [name] did undergo nerve conduction study with rapid repetitive stimulation on 04/10/16 which was negative for decremental response. The patient is reluctant to consider prednisone given her diabetes and risk of blood sugars being elevated on it UPDATE 04/19/2018CM [name], 72 year old female returns for follow-up with history of ocular myasthenia. [name] is currently on Mestinon sometimes taking a full pill but mostly half pills due to nausea. [name] is a diabetic so [name] does not want to take prednisone. [name] has had no double vision blurred vision or any drooping of her left eye. Vision today 20/50 right 20/40 left.[name] denies any difficulty moving her eyes, any dysphagia, weakness in her arms or legs. [name] has no trouble getting out, chair or climbing up and down steps.  [name] continues to be active. [name] returns for reevaluation. [name] is requesting something for nausea UPDATE 10/17/2018CM  [name], 72 year old female returns for follow-up with a history of ocular myasthenia with symptoms beginning in June 2016. [name] denies any episodes of diplopia, no ptosis seen today. Vision today 2070 bilaterally. [name] denies any weakness in her arms or legs or any problems with breathing [name] continues to be quite active. [name] continues to drive without difficulty [name] had 1 recent fall no injury.  [name] had a recent visit to her ophthalmologist.  [name] is currently on Mestinon sometimes takes a full pill and sometimes a half pill. [name] has nausea but  Zofran did not help, [name] returns for reevaluation. Recently diagnosed with arthritis. Update 08/19/2017 :[name] returns for follow-up after last visit 6 months ago. [name] states her cholesterol myasthenia symptoms are well controlled. [name] denies any drooping of the eyelids, diplopia,  muscle fatigue or weakness. [name] states [name] is tired all the time but this may be related to sleep apnea and lack of exercise. [name] is currently on Mestinon 30 mg 4 times daily and is tolerating it quite well. [name] was unable to tolerate 60 mg due to diarrhea and upset stomach. [name] has no new complaints today. [name] does complain of some intermittent trouble swallowing when [name] thinks [name] swallows too quickly at times gets stuck in the middle of the chest. [name] denies any nasal regurg, nasal twang to her speech heart food being stuck in the back of her mouth.[name] states her blood pressure and sugar control have been quite good. REVIEW OF SYSTEMS: Full 14 system review of systems performed and notable only for those listed, all others are neg:  Intermittent dysphagia only. All systems negative  ALLERGIES: Allergies  Allergen Reactions  . Latex Rash  . Celebrex [Celecoxib] Swelling    hives  . Vioxx [Rofecoxib] Hives    HOME MEDICATIONS: Outpatient Medications Prior to Visit  Medication Sig Dispense Refill  . ACCU-CHEK AVIVA PLUS test strip Apply 1 strip topically daily. for testing  1  . ACCU-CHEK SOFTCLIX LANCETS lancets TEST DAILY E11.9  1  . amoxicillin (AMOXIL) 875 MG tablet Take 875 mg by mouth 2 (two) times daily.    . APPLE CIDER VINEGAR PO Take by mouth daily.     Marland Kitchen aspirin EC 81 MG tablet Take 81 mg by mouth daily.    . Calcium Carb-Cholecalciferol (CALCIUM+D3) 600-800 MG-UNIT TABS Take 1 tablet by mouth daily.    . cetirizine (ZYRTEC) 10 MG tablet Take 10 mg by mouth daily.  0  . co-enzyme Q-10 50 MG capsule Take 100 mg by mouth daily.    . [employer] NATURAL LUTEIN EYE HEALTH PO Take 1 capsule by mouth daily.     . diclofenac sodium (VOLTAREN) 1 % GEL Apply 1 application topically 2 (two) times daily.    . fluticasone (FLONASE) 50 MCG/ACT nasal spray Place 2 sprays into both nostrils daily.     Marland Kitchen gabapentin (NEURONTIN) 100 MG capsule Take 100 mg by mouth 2 (two) times daily.    Marland Kitchen gabapentin  (NEURONTIN) 300 MG capsule Take by mouth daily.     . Ginger, Zingiber officinalis, (GINGER ROOT) 550 MG CAPS Take 1 capsule by mouth daily.    Marland Kitchen glipiZIDE (GLUCOTROL XL) 10 MG 24 hr tablet TAKE 1 TABLET BY MOUTH AFTER LUNCH AND SUPPER    . levocarnitine (CARNITOR) 250 MG capsule Take 500 mg by mouth daily.    Marland Kitchen losartan-hydrochlorothiazide (HYZAAR) 100-25 MG tablet TAKE (1) TABLET BY MOUTH ONCE DAILY.    . metFORMIN (GLUCOPHAGE-XR) 500 MG 24 hr tablet TAKE 2 TABLETS TWICE DAILY AS DIRECTED.    . Misc Natural Products (TART CHERRY ADVANCED) CAPS Take by mouth.    . Misc. Devices MISC Avivia Plus lancets and strips. Tests daily  DX E11.9    . Nutritional Supplements (EQ ESTROBLEND MENOPAUSE) TABS Take 1 tablet by mouth daily.    Marland Kitchen omega-3 acid ethyl esters (LOVAZA) 1 g capsule Take 1 g by mouth daily.    Marland Kitchen  pregabalin (LYRICA) 50 MG capsule Take 50 mg by mouth 2 (two) times daily.     . rosuvastatin (CRESTOR) 5 MG tablet Take 5 mg by mouth daily at 6 PM.    . sitaGLIPtin (JANUVIA) 100 MG tablet TAKE (1) TABLET BY MOUTH ONCE DAILY.    . Turmeric Curcumin 500 MG CAPS Take 1 capsule by mouth daily.    Marland Kitchen pyridostigmine (MESTINON) 60 MG tablet Try to take 1 tab 4 times daily (Patient taking differently: Takes 1/2 tablet four times per day) 120 tablet 6  . naproxen sodium (ANAPROX) 550 MG tablet     . losartan-hydrochlorothiazide (HYZAAR) 100-25 MG tablet TAKE (1) TABLET BY MOUTH ONCE DAILY.    Marland Kitchen pregabalin (LYRICA) 50 MG capsule TAKE 1 CAPSULE BY MOUTH TWICE DAILY.     No facility-administered medications prior to visit.     PAST MEDICAL HISTORY: Past Medical History:  Diagnosis Date  . Arthritis   . Bursitis   . Diabetes mellitus without complication ([place])   . Fibromyalgia   . GERD (gastroesophageal reflux disease)   . Hyperlipemia   . Hypertension   . Sinusitis   . Sleep apnea    uses a cpap  . Sleep apnea   . Wears glasses     PAST SURGICAL HISTORY: Past Surgical History:    Procedure Laterality Date  . ABDOMINAL HYSTERECTOMY  1999  . BREAST SURGERY  2002   rt br bx  . CERVICAL FUSION  2007  . CHOLECYSTECTOMY  2005   lap choli  . COLONOSCOPY    . FINGER GANGLION CYST EXCISION    . SHOULDER ARTHROSCOPY WITH OPEN ROTATOR CUFF REPAIR AND DISTAL CLAVICLE ACROMINECTOMY Right 09/09/2012   Procedure: Right Shoulder Arthroscopy with distal clavicle excision and mini-open rotator cuff repair.;  Surgeon: Alta Corning, MD;  Location: [facility];  Service: Orthopedics;  Laterality: Right;  . TONSILLECTOMY      FAMILY HISTORY: Family History  Problem Relation Age of Onset  . Emphysema Father   . Intracerebral hemorrhage Sister     SOCIAL HISTORY: Social History   Socioeconomic History  . Marital status: Married    Spouse name: Not on file  . Number of children: Not on file  . Years of education: Not on file  . Highest education level: Not on file  Occupational History  . Occupation: Retired  Scientific laboratory technician  . Financial resource strain: Not on file  . Food insecurity:    Worry: Not on file    Inability: Not on file  . Transportation needs:    Medical: Not on file    Non-medical: Not on file  Tobacco Use  . Smoking status: Never Smoker  . Smokeless tobacco: Never Used  Substance and Sexual Activity  . Alcohol use: No  . Drug use: No  . Sexual activity: Not on file  Lifestyle  . Physical activity:    Days per week: Not on file    Minutes per session: Not on file  . Stress: Not on file  Relationships  . Social connections:    Talks on phone: Not on file    Gets together: Not on file    Attends religious service: Not on file    Active member of club or organization: Not on file    Attends meetings of clubs or organizations: Not on file    Relationship status: Not on file  . Intimate partner violence:    Fear of current or ex  partner: Not on file    Emotionally abused: Not on file    Physically abused: Not on file    Forced  sexual activity: Not on file  Other Topics Concern  . Not on file  Social History Narrative  . Not on file     PHYSICAL EXAM  Vitals:   08/19/17 1353  BP: 105/60  Pulse: 64  Weight: 208 lb 9.6 oz (94.6 kg)  Height: 5\' 3"  (1.6 m)   Body mass index is 36.95 kg/m.  Generalized:  There is an elderly Caucasian lady,Obese female in no acute distress  Head: normocephalic and atraumatic,.   Neck: Supple, no carotid bruits  Cardiac: Regular rate rhythm, no murmur  Musculoskeletal: No deformity   Neurological examination   Mentation: Alert oriented to time, place, history taking. Attention span and concentration appropriate. Recent and remote memory intact.  Follows all commands speech and language fluent.   Cranial nerve II-XII: Pupils were equal round reactive to light extraocular movements were full, no nystagmus, with sustained upgaze minimal bilateral ptosis,  no diplopia . Facial sensation and strength were normal. hearing was intact to finger rubbing bilaterally. Uvula tongue midline. head turning and shoulder shrug were normal and symmetric.Tongue protrusion into cheek strength was normal. Motor: normal bulk and tone, full strength in the BUE, BLE, except decreased weakness distally right  lower extremity, AFO right lower leg fine finger movements normal,  Sensory: normal and symmetric to light touch, pinprick, and  Vibration, in the upper and lower extremities  Coordination: finger-nose-finger, heel-to-shin bilaterally, no dysmetria Reflexes: 1+ upper lower and symmetric, plantar responses were flexor bilaterally. Gait and Station: Rising up from seated position without assistance, normal stance,  moderate stride, good arm swing, smooth turning, able to perform tiptoe, and heel walking without difficulty. Tandem gait is unsteady. 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 [name] has positive acetylcholine receptor antibodies. [name] has been unable to tolerate 60 mg Mestinon 3 times a day due to nausea.  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 I do not recommend any further changes at the present time. [name] was advised to avoid excessive fatigue and tiredness. [name] was given a refill for a year. [name] was advised to return for follow-up in the future only as necessary and no scheduled appointment was made. I spent 25 min  in total face to face time with the patient more than 50% of which was spent counseling and coordination of care, reviewing test results reviewing medications and discussing and reviewing the diagnosis of ocular myasthenia gravis and further treatment options. Patient was given written instructions as well. ,  Antony Contras, MD [facility] [address] [place], [place] [address] [phone]

## 2017-08-21 ENCOUNTER — Encounter (HOSPITAL_COMMUNITY)
Admission: RE | Admit: 2017-08-21 | Discharge: 2017-08-21 | Disposition: A | Discharge status: Home / Self Care | Payer: PPO | Source: Ambulatory Visit | Attending: Internal Medicine | Admitting: Internal Medicine

## 2017-08-21 DIAGNOSIS — J849 Interstitial pulmonary disease, unspecified: Secondary | ICD-10-CM

## 2017-08-21 DIAGNOSIS — M47816 Spondylosis without myelopathy or radiculopathy, lumbar region: Secondary | ICD-10-CM | POA: Diagnosis not present

## 2017-08-21 DIAGNOSIS — M9903 Segmental and somatic dysfunction of lumbar region: Secondary | ICD-10-CM | POA: Diagnosis not present

## 2017-08-21 DIAGNOSIS — S338XXA Sprain of other parts of lumbar spine and pelvis, initial encounter: Secondary | ICD-10-CM | POA: Diagnosis not present

## 2017-08-21 NOTE — Progress Notes (Signed)
Daily Session Note  Patient Details  Name: [name] MRN: [id number] Date of Birth: 10/10/1945 Referring Provider:     PULMONARY REHAB OTHER RESP ORIENTATION from 07/01/2017 in [place]  Referring Provider  Dr. Chase Caller       Encounter Date: 08/21/2017  Check In: Session Check In - 08/21/17 1045      Check-In   Location  [facility]    Staff Present  Diane Angelina Pih, MS, EP, [facility], Exercise Physiologist;Gregory Luther Parody, BS, EP, Exercise Physiologist;Aicha Clingenpeel Wynetta Emery, RN, BSN    Supervising physician immediately available to respond to emergencies  See telemetry face sheet for immediately available MD    Medication changes reported      No    Fall or balance concerns reported     Yes    Comments  Has fallen twice in the past 12 months.     Warm-up and Cool-down  Performed as group-led Higher education careers adviser Performed  Yes    VAD Patient?  No      Pain Assessment   Currently in Pain?  No/denies    Pain Score  0-No pain    Multiple Pain Sites  No       Capillary Blood Glucose: No results found for this or any previous visit (from the past 24 hour(s)).  Exercise Prescription Changes - 08/20/17 1500      Response to Exercise   Blood Pressure (Admit)  118/58    Blood Pressure (Exercise)  170/62    Blood Pressure (Exit)  130/70    Heart Rate (Admit)  80 bpm    Heart Rate (Exercise)  96 bpm    Heart Rate (Exit)  74 bpm    Oxygen Saturation (Admit)  96 %    Oxygen Saturation (Exercise)  97 %    Oxygen Saturation (Exit)  97 %    Rating of Perceived Exertion (Exercise)  13    Perceived Dyspnea (Exercise)  13    Duration  Progress to 30 minutes of  aerobic without signs/symptoms of physical distress    Intensity  THRR unchanged 108-122-136      Progression   Progression  Continue to progress workloads to maintain intensity without signs/symptoms of physical distress.      Resistance Training   Training Prescription  Yes    Weight  1    Reps  10-15      NuStep   Level  2    SPM  120    Minutes  20    METs  1.8      Arm Ergometer   Level  1.4    Watts  14    RPM  12    Minutes  15    METs  2.3      Home Exercise Plan   Plans to continue exercise at  Home (comment)    Frequency  Add 2 additional days to program exercise sessions.    Initial Home Exercises Provided  07/15/17       Social History   Tobacco Use  Smoking Status Never Smoker  Smokeless Tobacco Never Used    Goals Met:  Proper associated with RPD/PD & O2 Sat Independence with exercise equipment Improved SOB with ADL's Using PLB without cueing & demonstrates good technique Exercise tolerated well No report of cardiac concerns or symptoms Strength training completed today  Goals Unmet:  Not Applicable  Comments: Check out 1145.   Dr. Sinda Du  is Market researcher for [employer].

## 2017-08-26 ENCOUNTER — Encounter (HOSPITAL_COMMUNITY)
Admission: RE | Admit: 2017-08-26 | Discharge: 2017-08-26 | Disposition: A | Discharge status: Home / Self Care | Payer: PPO | Source: Ambulatory Visit | Attending: Internal Medicine | Admitting: Internal Medicine

## 2017-08-26 DIAGNOSIS — J849 Interstitial pulmonary disease, unspecified: Secondary | ICD-10-CM | POA: Diagnosis not present

## 2017-08-26 NOTE — Progress Notes (Signed)
Daily Session Note  Patient Details  Name: ANALIS DISTLER MRN: [id number] Date of Birth: 10/04/1945 Referring Provider:     PULMONARY REHAB OTHER RESP ORIENTATION from 07/01/2017 in [place]  Referring Provider  Dr. Chase Caller       Encounter Date: 08/26/2017  Check In: Session Check In - 08/26/17 1053      Check-In   Location  [facility]    Staff Present  Diane Angelina Pih, MS, EP, [facility], Exercise Physiologist;Christin [place]oline Luther Parody, BS, EP, Exercise Physiologist    Supervising physician immediately available to respond to emergencies  See telemetry face sheet for immediately available MD    Medication changes reported      No    Fall or balance concerns reported     Yes    Comments  Has fallen twice in the past 12 months.     Warm-up and Cool-down  Performed as group-led Higher education careers adviser Performed  Yes    VAD Patient?  No      Pain Assessment   Currently in Pain?  No/denies    Pain Score  0-No pain    Multiple Pain Sites  No       Capillary Blood Glucose: No results found for this or any previous visit (from the past 24 hour(s)).    Social History   Tobacco Use  Smoking Status Never Smoker  Smokeless Tobacco Never Used    Goals Met:  Independence with exercise equipment Improved SOB with ADL's Using PLB without cueing & demonstrates good technique Exercise tolerated well No report of cardiac concerns or symptoms Strength training completed today  Goals Unmet:  Not Applicable  Comments: Check out 1145   Dr. Sinda Du is Medical Director for [facility].

## 2017-08-27 DIAGNOSIS — G7000 Myasthenia gravis without (acute) exacerbation: Secondary | ICD-10-CM | POA: Diagnosis not present

## 2017-08-27 DIAGNOSIS — Z6834 Body mass index (BMI) 34.0-34.9, adult: Secondary | ICD-10-CM | POA: Diagnosis not present

## 2017-08-27 DIAGNOSIS — E785 Hyperlipidemia, unspecified: Secondary | ICD-10-CM | POA: Diagnosis not present

## 2017-08-27 DIAGNOSIS — E669 Obesity, unspecified: Secondary | ICD-10-CM | POA: Diagnosis not present

## 2017-08-27 DIAGNOSIS — J849 Interstitial pulmonary disease, unspecified: Secondary | ICD-10-CM | POA: Diagnosis not present

## 2017-08-27 DIAGNOSIS — E1142 Type 2 diabetes mellitus with diabetic polyneuropathy: Secondary | ICD-10-CM | POA: Diagnosis not present

## 2017-08-27 DIAGNOSIS — I10 Essential (primary) hypertension: Secondary | ICD-10-CM | POA: Diagnosis not present

## 2017-08-27 DIAGNOSIS — E1159 Type 2 diabetes mellitus with other circulatory complications: Secondary | ICD-10-CM | POA: Diagnosis not present

## 2017-08-27 DIAGNOSIS — E1169 Type 2 diabetes mellitus with other specified complication: Secondary | ICD-10-CM | POA: Diagnosis not present

## 2017-08-28 ENCOUNTER — Encounter (HOSPITAL_COMMUNITY)
Admission: RE | Admit: 2017-08-28 | Discharge: 2017-08-28 | Disposition: A | Discharge status: Home / Self Care | Payer: PPO | Source: Ambulatory Visit | Attending: Internal Medicine | Admitting: Internal Medicine

## 2017-08-28 DIAGNOSIS — Z79899 Other long term (current) drug therapy: Secondary | ICD-10-CM | POA: Insufficient documentation

## 2017-08-28 DIAGNOSIS — Z7982 Long term (current) use of aspirin: Secondary | ICD-10-CM | POA: Diagnosis not present

## 2017-08-28 DIAGNOSIS — I10 Essential (primary) hypertension: Secondary | ICD-10-CM | POA: Diagnosis not present

## 2017-08-28 DIAGNOSIS — E119 Type 2 diabetes mellitus without complications: Secondary | ICD-10-CM | POA: Diagnosis not present

## 2017-08-28 DIAGNOSIS — G4730 Sleep apnea, unspecified: Secondary | ICD-10-CM | POA: Diagnosis not present

## 2017-08-28 DIAGNOSIS — J849 Interstitial pulmonary disease, unspecified: Secondary | ICD-10-CM | POA: Insufficient documentation

## 2017-08-28 DIAGNOSIS — E785 Hyperlipidemia, unspecified: Secondary | ICD-10-CM | POA: Diagnosis not present

## 2017-08-28 DIAGNOSIS — M797 Fibromyalgia: Secondary | ICD-10-CM | POA: Diagnosis not present

## 2017-08-28 DIAGNOSIS — Z7984 Long term (current) use of oral hypoglycemic drugs: Secondary | ICD-10-CM | POA: Diagnosis not present

## 2017-08-28 DIAGNOSIS — K219 Gastro-esophageal reflux disease without esophagitis: Secondary | ICD-10-CM | POA: Diagnosis not present

## 2017-08-28 NOTE — Progress Notes (Signed)
Daily Session Note  Patient Details  Name: [name] MRN: [id number] Date of Birth: Sep 14, 1945 Referring Provider:     PULMONARY REHAB OTHER RESP ORIENTATION from 07/01/2017 in [place]  Referring Provider  Dr. Chase Caller       Encounter Date: 08/28/2017  Check In: Session Check In - 08/28/17 1045      Check-In   Location  [facility]    Staff Present  Diane Angelina Pih, MS, EP, CH[facility], Exercise Physiologist;Gregory Luther Parody, BS, EP, Exercise Physiologist;Reagan Behlke Wynetta Emery, RN, BSN    Supervising physician immediately available to respond to emergencies  See telemetry face sheet for immediately available MD    Medication changes reported      No    Fall or balance concerns reported     Yes    Comments  Has fallen twice in the past 12 months.     Warm-up and Cool-down  Performed as group-led Higher education careers adviser Performed  Yes    VAD Patient?  No      Pain Assessment   Currently in Pain?  No/denies    Pain Score  0-No pain    Multiple Pain Sites  No       Capillary Blood Glucose: No results found for this or any previous visit (from the past 24 hour(s)).    Social History   Tobacco Use  Smoking Status Never Smoker  Smokeless Tobacco Never Used    Goals Met:  Proper associated with RPD/PD & O2 Sat Independence with exercise equipment Improved SOB with ADL's Using PLB without cueing & demonstrates good technique Exercise tolerated well Personal goals reviewed No report of cardiac concerns or symptoms Strength training completed today  Goals Unmet:  Not Applicable  Comments: Check out 1145.   Dr. Sinda Du is Medical Director for [facility].

## 2017-08-28 NOTE — Progress Notes (Signed)
Pulmonary Individual Treatment Plan  Patient Details  Name: JACQUITA MULHEARN MRN: [id number] Date of Birth: May 07, 1945 Referring Provider:     PULMONARY REHAB OTHER RESP ORIENTATION from 07/01/2017 in [place]  Referring Provider  Dr. Chase Caller       Initial Encounter Date:    [place] from 07/01/2017 in [place]  Date  07/01/17  Referring Provider  Dr. Chase Caller       Visit Diagnosis: ILD (interstitial lung disease) ([place])  Patient's Home Medications on Admission:   Current Outpatient Medications:  .  ACCU-CHEK AVIVA PLUS test strip, Apply 1 strip topically daily. for testing, Disp: , Rfl: 1 .  ACCU-CHEK SOFTCLIX LANCETS lancets, TEST DAILY E11.9, Disp: , Rfl: 1 .  amoxicillin (AMOXIL) 875 MG tablet, Take 875 mg by mouth 2 (two) times daily., Disp: , Rfl:  .  APPLE CIDER VINEGAR PO, Take by mouth daily. , Disp: , Rfl:  .  aspirin EC 81 MG tablet, Take 81 mg by mouth daily., Disp: , Rfl:  .  Calcium Carb-Cholecalciferol (CALCIUM+D3) 600-800 MG-UNIT TABS, Take 1 tablet by mouth daily., Disp: , Rfl:  .  cetirizine (ZYRTEC) 10 MG tablet, Take 10 mg by mouth daily., Disp: , Rfl: 0 .  co-enzyme Q-10 50 MG capsule, Take 100 mg by mouth daily., Disp: , Rfl:  .  [employer] NATURAL LUTEIN EYE HEALTH PO, Take 1 capsule by mouth daily. , Disp: , Rfl:  .  diclofenac sodium (VOLTAREN) 1 % GEL, Apply 1 application topically 2 (two) times daily., Disp: , Rfl:  .  fluticasone (FLONASE) 50 MCG/ACT nasal spray, Place 2 sprays into both nostrils daily. , Disp: , Rfl:  .  gabapentin (NEURONTIN) 100 MG capsule, Take 100 mg by mouth 2 (two) times daily., Disp: , Rfl:  .  gabapentin (NEURONTIN) 300 MG capsule, Take by mouth daily. , Disp: , Rfl:  .  Ginger, Zingiber officinalis, (GINGER ROOT) 550 MG CAPS, Take 1 capsule by mouth daily., Disp: , Rfl:  .  glipiZIDE (GLUCOTROL XL) 10 MG 24 hr tablet, TAKE 1 TABLET BY MOUTH AFTER LUNCH AND  SUPPER, Disp: , Rfl:  .  levocarnitine (CARNITOR) 250 MG capsule, Take 500 mg by mouth daily., Disp: , Rfl:  .  losartan-hydrochlorothiazide (HYZAAR) 100-25 MG tablet, TAKE (1) TABLET BY MOUTH ONCE DAILY., Disp: , Rfl:  .  metFORMIN (GLUCOPHAGE-XR) 500 MG 24 hr tablet, TAKE 2 TABLETS TWICE DAILY AS DIRECTED., Disp: , Rfl:  .  Misc Natural Products (TART CHERRY ADVANCED) CAPS, Take by mouth., Disp: , Rfl:  .  Misc. Devices MISC, Avivia Plus lancets and strips. Tests daily  DX E11.9, Disp: , Rfl:  .  naproxen sodium (ANAPROX) 550 MG tablet, , Disp: , Rfl:  .  Nutritional Supplements (EQ ESTROBLEND MENOPAUSE) TABS, Take 1 tablet by mouth daily., Disp: , Rfl:  .  omega-3 acid ethyl esters (LOVAZA) 1 g capsule, Take 1 g by mouth daily., Disp: , Rfl:  .  pregabalin (LYRICA) 50 MG capsule, Take 50 mg by mouth 2 (two) times daily. , Disp: , Rfl:  .  pyridostigmine (MESTINON) 60 MG tablet, Takes 1/2 tablet four times per day, Disp: 120 tablet, Rfl: 6 .  rosuvastatin (CRESTOR) 5 MG tablet, Take 5 mg by mouth daily at 6 PM., Disp: , Rfl:  .  sitaGLIPtin (JANUVIA) 100 MG tablet, TAKE (1) TABLET BY MOUTH ONCE DAILY., Disp: , Rfl:  .  Turmeric Curcumin 500 MG  CAPS, Take 1 capsule by mouth daily., Disp: , Rfl:   Past Medical History: Past Medical History:  Diagnosis Date  . Arthritis   . Bursitis   . Diabetes mellitus without complication ([place])   . Fibromyalgia   . GERD (gastroesophageal reflux disease)   . Hyperlipemia   . Hypertension   . Sinusitis   . Sleep apnea    uses a cpap  . Sleep apnea   . Wears glasses     Tobacco Use: Social History   Tobacco Use  Smoking Status Never Smoker  Smokeless Tobacco Never Used    Labs: Recent Review Scientist, physiological    Labs for ITP Cardiac and Pulmonary Rehab Latest Ref Rng & Units 01/07/2011   TCO2 0 - 100 mmol/L 25      Capillary Blood Glucose: Lab Results  Component Value Date   GLUCAP 140 (H) 09/09/2012     Pulmonary Assessment  Scores: Pulmonary Assessment Scores    Row Name 07/01/17 1540         ADL UCSD   ADL Phase  Entry     SOB Score total  33     Rest  1     Walk  10     Stairs  4     Bath  0     Dress  0     Shop  0       CAT Score   CAT Score  11       mMRC Score   mMRC Score  3        Pulmonary Function Assessment: Pulmonary Function Assessment - 07/01/17 1539      Pulmonary Function Tests   FVC%  93 %    FEV1%  110 %    FEV1/FVC Ratio  117    RV%  35 %    DLCO%  71 %      Breath   Bilateral Breath Sounds  Clear    Shortness of Breath  Yes       Exercise Target Goals:    Exercise Program Goal: Individual exercise prescription set using results from initial 6 min walk test and THRR while considering  patient's activity barriers and safety.   Exercise Prescription Goal: Initial exercise prescription builds to 30-45 minutes a day of aerobic activity, 2-3 days per week.  Home exercise guidelines will be given to patient during program as part of exercise prescription that the participant will acknowledge.  Activity Barriers & Risk Stratification: Activity Barriers & Cardiac Risk Stratification - 07/01/17 1308      Activity Barriers & Cardiac Risk Stratification   Activity Barriers  Neck/Spine Problems;History of Falls;Deconditioning;Balance Concerns;Assistive Device;Shortness of Breath    Cardiac Risk Stratification  High       6 Minute Walk: 6 Minute Walk    Row Name 07/01/17 1411         6 Minute Walk   Phase  Initial     Distance  950 feet     Distance % Change  0 %     Distance Feet Change  0 ft     Walk Time  6 minutes     # of Rest Breaks  0     MPH  1.79     METS  2.37     RPE  11     Perceived Dyspnea   13     VO2 Peak  8.17     Symptoms  No  Resting HR  82 bpm     Resting BP  132/64     Resting Oxygen Saturation   97 %     Exercise Oxygen Saturation  during 6 min walk  97 %     Max Ex. HR  112 bpm     Max Ex. BP  180/76     2 Minute Post BP   136/64        Oxygen Initial Assessment: Oxygen Initial Assessment - 07/01/17 1556      Home Oxygen   Home Oxygen Device  None    Sleep Oxygen Prescription  None    Home Exercise Oxygen Prescription  None    Home at Rest Exercise Oxygen Prescription  None      Initial 6 min Walk   Oxygen Used  None      Program Oxygen Prescription   Program Oxygen Prescription  None       Oxygen Re-Evaluation: Oxygen Re-Evaluation    Row Name 07/31/17 1335 08/28/17 1518           Program Oxygen Prescription   Program Oxygen Prescription  None  None        Home Oxygen   Home Oxygen Device  None  None      Sleep Oxygen Prescription  None  None      Home Exercise Oxygen Prescription  None  None      Home at Rest Exercise Oxygen Prescription  None  None        Goals/Expected Outcomes   Short Term Goals  To learn and understand importance of monitoring SPO2 with pulse oximeter and demonstrate accurate use of the pulse oximeter.;To learn and understand importance of maintaining oxygen saturations>88%;To learn and demonstrate proper pursed lip breathing techniques or other breathing techniques.  To learn and understand importance of monitoring SPO2 with pulse oximeter and demonstrate accurate use of the pulse oximeter.;To learn and understand importance of maintaining oxygen saturations>88%;To learn and demonstrate proper pursed lip breathing techniques or other breathing techniques.      Long  Term Goals  Verbalizes importance of monitoring SPO2 with pulse oximeter and return demonstration;Maintenance of O2 saturations>88%;Exhibits proper breathing techniques, such as pursed lip breathing or other method taught during program session  Verbalizes importance of monitoring SPO2 with pulse oximeter and return demonstration;Maintenance of O2 saturations>88%;Exhibits proper breathing techniques, such as pursed lip breathing or other method taught during program session      Comments  Patient  demonstrates proper use of pulse oximeter with return demonstration and she verbalizes the importance of maintaining O2 saturations >88%. She also demonstrates proper pursed lip breathing during the program.   Patient demonstrates proper use of pulse oximeter with return demonstration and she verbalizes the importance of maintaining O2 saturations >88%. She also demonstrates proper pursed lip breathing during the program.       Goals/Expected Outcomes  Patient will continues to meet her short and long term goals.   Patient will continues to meet her short and long term goals.          Oxygen Discharge (Final Oxygen Re-Evaluation): Oxygen Re-Evaluation - 08/28/17 1518      Program Oxygen Prescription   Program Oxygen Prescription  None      Home Oxygen   Home Oxygen Device  None    Sleep Oxygen Prescription  None    Home Exercise Oxygen Prescription  None    Home at Rest Exercise Oxygen Prescription  None  Goals/Expected Outcomes   Short Term Goals  To learn and understand importance of monitoring SPO2 with pulse oximeter and demonstrate accurate use of the pulse oximeter.;To learn and understand importance of maintaining oxygen saturations>88%;To learn and demonstrate proper pursed lip breathing techniques or other breathing techniques.    Long  Term Goals  Verbalizes importance of monitoring SPO2 with pulse oximeter and return demonstration;Maintenance of O2 saturations>88%;Exhibits proper breathing techniques, such as pursed lip breathing or other method taught during program session    Comments  Patient demonstrates proper use of pulse oximeter with return demonstration and she verbalizes the importance of maintaining O2 saturations >88%. She also demonstrates proper pursed lip breathing during the program.     Goals/Expected Outcomes  Patient will continues to meet her short and long term goals.        Initial Exercise Prescription: Initial Exercise Prescription - 07/01/17 1300       Date of Initial Exercise RX and Referring Provider   Date  07/01/17    Referring Provider  Dr. Chase Caller       NuStep   Level  2    SPM  66    Minutes  15    METs  1.4      Arm Ergometer   Level  1.2    Watts  12    RPM  12    Minutes  20    METs  1.9      Prescription Details   Frequency (times per week)  3    Duration  Progress to 30 minutes of continuous aerobic without signs/symptoms of physical distress      Intensity   THRR 40-80% of Max Heartrate  [phone]    Ratings of Perceived Exertion  11-13    Perceived Dyspnea  0-4      Progression   Progression  Continue progressive overload as per policy without signs/symptoms or physical distress.      Resistance Training   Training Prescription  Yes    Weight  1    Reps  10-15       Perform Capillary Blood Glucose checks as needed.  Exercise Prescription Changes:  Exercise Prescription Changes    Row Name 07/17/17 0700 07/25/17 1400 08/20/17 1500         Response to Exercise   Blood Pressure (Admit)  120/62  116/50  118/58     Blood Pressure (Exercise)  180/80  180/64  170/62     Blood Pressure (Exit)  148/82  100/50  130/70     Heart Rate (Admit)  78 bpm  80 bpm  80 bpm     Heart Rate (Exercise)  94 bpm  95 bpm  96 bpm     Heart Rate (Exit)  69 bpm  77 bpm  74 bpm     Oxygen Saturation (Admit)  98 %  98 %  96 %     Oxygen Saturation (Exercise)  93 %  97 %  97 %     Oxygen Saturation (Exit)  96 %  96 %  97 %     Rating of Perceived Exertion (Exercise)  _0 Perceived Dyspnea (Exercise)  _1 Duration  Progress to 30 minutes of  aerobic without signs/symptoms of physical distress  Progress to 30 minutes of  aerobic without signs/symptoms of physical distress  Progress to 30 minutes of  aerobic without signs/symptoms  of physical distress     Intensity  THRR New 107-121-136  THRR New 108-122-136  THRR unchanged 108-122-136       Progression   Progression  Continue to progress  workloads to maintain intensity without signs/symptoms of physical distress.  Continue to progress workloads to maintain intensity without signs/symptoms of physical distress.  Continue to progress workloads to maintain intensity without signs/symptoms of physical distress.       Resistance Training   Training Prescription  Yes  Yes  Yes     Weight  _0 Reps  10-15  10-15  10-15       NuStep   Level  _1 SPM  81  97  120     Minutes  _2 METs  1.7  1.7  1.8       Arm Ergometer   Level  1  1.3  1.4     Watts  _3 RPM  _4 Minutes  _5 METs  1  1.3  2.3       Home Exercise Plan   Plans to continue exercise at  Home (comment)  Home (comment)  Home (comment)     Frequency  Add 2 additional days to program exercise sessions.  Add 2 additional days to program exercise sessions.  Add 2 additional days to program exercise sessions.     Initial Home Exercises Provided  07/15/17  07/15/17  07/15/17        Exercise Comments:  Exercise Comments    Row Name 07/17/17 0751 07/25/17 1429 08/20/17 1504       Exercise Comments  Patient has just started PR and will slowly be progressed in time.   Patient is doing very well in PR even on only her 5th session she has progressed on the arm ergometer to level 1.3. Patient has not been in the program long enough to see progress however she will continued to be monitored.   Patient is doing very well in PR she has increased her SPMs on the nustep and her level on the arm ergometer and she has gottn stronger since the program began. Patient enjoys the time with friends that she has made.         Exercise Goals and Review:  Exercise Goals    Row Name 07/01/17 1313             Exercise Goals   Increase Physical Activity  Yes       Intervention  Provide advice, education, support and counseling about physical activity/exercise needs.;Develop an individualized exercise prescription for  aerobic and resistive training based on initial evaluation findings, risk stratification, comorbidities and participant's personal goals.       Expected Outcomes  Short Term: Attend rehab on a regular basis to increase amount of physical activity.       Increase Strength and Stamina  Yes       Intervention  Develop an individualized exercise prescription for aerobic and resistive training based on initial evaluation findings, risk stratification, comorbidities and participant's personal goals.;Provide advice, education, support and counseling about physical activity/exercise needs.       Expected Outcomes  Short Term: Increase workloads from initial exercise prescription for resistance, speed, and  METs.       Able to understand and use rate of perceived exertion (RPE) scale  Yes       Intervention  Provide education and explanation on how to use RPE scale       Expected Outcomes  Short Term: Able to use RPE daily in rehab to express subjective intensity level;Long Term:  Able to use RPE to guide intensity level when exercising independently       Able to understand and use Dyspnea scale  Yes       Intervention  Provide education and explanation on how to use Dyspnea scale       Expected Outcomes  Short Term: Able to use Dyspnea scale daily in rehab to express subjective sense of shortness of breath during exertion;Long Term: Able to use Dyspnea scale to guide intensity level when exercising independently       Knowledge and understanding of Target Heart Rate Range (THRR)  Yes       Intervention  Provide education and explanation of THRR including how the numbers were predicted and where they are located for reference       Expected Outcomes  Short Term: Able to state/look up THRR;Long Term: Able to use THRR to govern intensity when exercising independently;Short Term: Able to use daily as guideline for intensity in rehab       Able to check pulse independently  Yes       Intervention  Provide  education and demonstration on how to check pulse in carotid and radial arteries.;Review the importance of being able to check your own pulse for safety during independent exercise       Expected Outcomes  Short Term: Able to explain why pulse checking is important during independent exercise;Long Term: Able to check pulse independently and accurately       Understanding of Exercise Prescription  Yes       Intervention  Provide education, explanation, and written materials on patient's individual exercise prescription       Expected Outcomes  Short Term: Able to explain program exercise prescription;Long Term: Able to explain home exercise prescription to exercise independently          Exercise Goals Re-Evaluation : Exercise Goals Re-Evaluation    Row Name 07/25/17 1428 08/25/17 1454           Exercise Goal Re-Evaluation   Exercise Goals Review  Increase Physical Activity;Increase Strength and Stamina;Able to understand and use Dyspnea scale;Knowledge and understanding of Target Heart Rate Range (THRR);Able to understand and use rate of perceived exertion (RPE) scale;Able to check pulse independently  Increase Physical Activity;Increase Strength and Stamina;Able to understand and use Dyspnea scale;Knowledge and understanding of Target Heart Rate Range (THRR);Able to understand and use rate of perceived exertion (RPE) scale;Able to check pulse independently      Comments  Patient is doing very well in PR even on only her 5th session she has progressed on the arm ergometer to level 1.3. Patient has not been in the program long enough to see progress however she will continued to be monitored.   Patient is doing well in PR and has maintained her levels on the two machines the arm ergometer and the nustep machine. Patient seems to be enjoying the program and looks to have gained some mobility from the program. Patient will continued to be monitored throughout the remainder of the program.        Expected Outcomes  Patient wishes to have better  mobility and to lose weight.   Patient wishes to have better mobility and to lose weight.          Discharge Exercise Prescription (Final Exercise Prescription Changes): Exercise Prescription Changes - 08/20/17 1500      Response to Exercise   Blood Pressure (Admit)  118/58    Blood Pressure (Exercise)  170/62    Blood Pressure (Exit)  130/70    Heart Rate (Admit)  80 bpm    Heart Rate (Exercise)  96 bpm    Heart Rate (Exit)  74 bpm    Oxygen Saturation (Admit)  96 %    Oxygen Saturation (Exercise)  97 %    Oxygen Saturation (Exit)  97 %    Rating of Perceived Exertion (Exercise)  13    Perceived Dyspnea (Exercise)  13    Duration  Progress to 30 minutes of  aerobic without signs/symptoms of physical distress    Intensity  THRR unchanged 108-122-136      Progression   Progression  Continue to progress workloads to maintain intensity without signs/symptoms of physical distress.      Resistance Training   Training Prescription  Yes    Weight  1    Reps  10-15      NuStep   Level  2    SPM  120    Minutes  20    METs  1.8      Arm Ergometer   Level  1.4    Watts  14    RPM  12    Minutes  15    METs  2.3      Home Exercise Plan   Plans to continue exercise at  Home (comment)    Frequency  Add 2 additional days to program exercise sessions.    Initial Home Exercises Provided  07/15/17       Nutrition:  Target Goals: Understanding of nutrition guidelines, daily intake of sodium <1523m, cholesterol <2020m calories 30% from fat and 7% or less from saturated fats, daily to have 5 or more servings of fruits and vegetables.  Biometrics: Pre Biometrics - 07/01/17 1313      Pre Biometrics   Height  _0  (1.651 m)    Weight  214 lb 1.6 oz (97.1 kg)    Waist Circumference  48.5 inches    Hip Circumference  47 inches    Waist to Hip Ratio  1.03 %    BMI (Calculated)  35.63    Triceps Skinfold  26 mm    % Body Fat   47.6 %    Grip Strength  28.93 kg    Flexibility  0 in    Single Leg Stand  0 seconds        Nutrition Therapy Plan and Nutrition Goals: Nutrition Therapy & Goals - 07/31/17 1526      Personal Nutrition Goals   Nutrition Goal  For heart healthy choices add >50% of whole grains, make half their plate fruits and vegetables. Discuss the difference between starchy vegetables and leafy greens, and how leafy vegetables provide fiber, helps maintain healthy weight, helps control blood glucose, and lowers cholesterol.  Discuss purchasing fresh or frozen vegetable to reduce sodium and not to add grease, fat or sugar. Consume <18oz of red meat per week. Consume lean cuts of meats and very little of meats high in sodium and nitrates such as pork and lunch meats. Discussed portion control for all food groups.  Additional Goals?  No    Comments  Patient met with RD 07/31/17.      Intervention Plan   Intervention  Nutrition handout(s) given to patient.    Expected Outcomes  Short Term Goal: A plan has been developed with personal nutrition goals set during dietitian appointment.;Long Term Goal: Adherence to prescribed nutrition plan.       Nutrition Assessments: Nutrition Assessments - 07/01/17 1600      MEDFICTS Scores   Pre Score  62       Nutrition Goals Re-Evaluation: Nutrition Goals Re-Evaluation    [place] Name 08/28/17 1518             Goals   Current Weight  208 lb 8 oz (94.6 kg)       Nutrition Goal  For heart healthy choices add >50% of whole grains, make half their plate fruits and vegetables. Discuss the difference between starchy vegetables and leafy greens, and how leafy vegetables provide fiber, helps maintain healthy weight, helps control blood glucose, and lowers cholesterol.  Discuss purchasing fresh or frozen vegetable to reduce sodium and not to add grease, fat or sugar. Consume <18oz of red meat per week. Consume lean cuts of meats and very little of meats high in sodium  and nitrates such as pork and lunch meats. Discussed portion control for all food groups.         Comment  Patient has lost 5.6 lbs since last 30 day review. 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 Will continue to monitor for progress.        Expected Outcome  Patient will continue to lose weight.          Personal Goal #2 Re-Evaluation   Personal Goal #2  Patient eats a regular diet.           Nutrition Goals Discharge (Final Nutrition Goals Re-Evaluation): Nutrition Goals Re-Evaluation - 08/28/17 1518      Goals   Current Weight  208 lb 8 oz (94.6 kg)    Nutrition Goal  For heart healthy choices add >50% of whole grains, make half their plate fruits and vegetables. Discuss the difference between starchy vegetables and leafy greens, and how leafy vegetables provide fiber, helps maintain healthy weight, helps control blood glucose, and lowers cholesterol.  Discuss purchasing fresh or frozen vegetable to reduce sodium and not to add grease, fat or sugar. Consume <18oz of red meat per week. Consume lean cuts of meats and very little of meats high in sodium and nitrates such as pork and lunch meats. Discussed portion control for all food groups.      Comment  Patient has lost 5.6 lbs since last 30 day review. 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 Will continue to monitor for progress.     Expected Outcome  Patient will continue to lose weight.       Personal Goal #2 Re-Evaluation   Personal Goal #2  Patient eats a regular diet.        Psychosocial: Target Goals: Acknowledge presence or absence of significant depression and/or stress, maximize coping skills, provide positive support system. Participant is able to verbalize types and ability to use techniques and skills needed for reducing stress and depression.  Initial Review & Psychosocial Screening: Initial Psych Review & Screening - 07/01/17 1603      Initial Review   Current issues with  Current Depression;Current Sleep Concerns      Family  Dynamics   Good Support System?  Yes      Barriers   Psychosocial barriers to participate in program  Psychosocial barriers identified (see note) QOL score indicated depression at 15.56 overall.      Screening Interventions   Interventions  Encouraged to exercise;Provide feedback about the scores to participant    Expected Outcomes  Short Term goal: Identification and review with participant of any Quality of Life or Depression concerns found by scoring the questionnaire.;Long Term goal: The participant improves quality of Life and PHQ9 Scores as seen by post scores and/or verbalization of changes       Quality of Life Scores: Quality of Life - 07/01/17 1314      Quality of Life Scores   Health/Function Pre  13.27 %    Socioeconomic Pre  11.33 %    Psych/Spiritual Pre  18.07 %    Family Pre  24 %    GLOBAL Pre  15.56 %      Scores of 19 and below usually indicate a poorer quality of life in these areas.  A difference of  2-3 points is a clinically meaningful difference.  A difference of 2-3 points in the total score of the Quality of Life Index has been associated with significant improvement in overall quality of life, self-image, physical symptoms, and general health in studies assessing change in quality of life.   PHQ-9: Recent Review Flowsheet Data    Depression screen [facility] 2/9 07/01/2017   Decreased Interest 0   Down, Depressed, Hopeless 0   PHQ - 2 Score 0   Altered sleeping 2   Tired, decreased energy 3   Change in appetite 1   Feeling bad or failure about yourself  0   Trouble concentrating 0   Moving slowly or fidgety/restless 0   Suicidal thoughts 0   PHQ-9 Score 6   Difficult doing work/chores Somewhat difficult     Interpretation of Total Score  Total Score Depression Severity:  1-4 = Minimal depression, 5-9 = Mild depression, 10-14 = Moderate depression, 15-19 = Moderately severe depression, 20-27 = Severe depression   Psychosocial Evaluation and  Intervention: Psychosocial Evaluation - 07/01/17 1604      Psychosocial Evaluation & Interventions   Interventions  Encouraged to exercise with the program and follow exercise prescription    Comments  Patient does not want to be referred to a therapist.     Continue Psychosocial Services   Follow up required by staff       Psychosocial Re-Evaluation: Psychosocial Re-Evaluation    [place] Name 07/31/17 1342 08/28/17 1523           Psychosocial Re-Evaluation   Current issues with  Current Depression;Current Sleep Concerns  Current Depression;Current Sleep Concerns      Comments  Patient's initial QOL score was 15.56 and her PHQ-9 score was 6 indicating depression. She says her does not feel she needs treatment at this time.   Patient's initial QOL score was 15.56 and her PHQ-9 score was 6 indicating depression. She says her does not feel she needs treatment at this time.       Expected Outcomes  Patient's QOL and PHQ-9 scores will improve or remain the same at discharge and she will have no additional psychosocial issues identified.   Patient's QOL and PHQ-9 scores will improve or remain the same at discharge and she will have no additional psychosocial issues identified.       Interventions  Stress management education;Encouraged to attend Cardiac Rehabilitation for the exercise;Relaxation  education  Stress management education;Encouraged to attend Cardiac Rehabilitation for the exercise;Relaxation education      Continue Psychosocial Services   No Follow up required  No Follow up required         Psychosocial Discharge (Final Psychosocial Re-Evaluation): Psychosocial Re-Evaluation - 08/28/17 1523      Psychosocial Re-Evaluation   Current issues with  Current Depression;Current Sleep Concerns    Comments  Patient's initial QOL score was 15.56 and her PHQ-9 score was 6 indicating depression. She says her does not feel she needs treatment at this time.     Expected Outcomes  Patient's QOL  and PHQ-9 scores will improve or remain the same at discharge and she will have no additional psychosocial issues identified.     Interventions  Stress management education;Encouraged to attend Cardiac Rehabilitation for the exercise;Relaxation education    Continue Psychosocial Services   No Follow up required        Education: Education Goals: Education classes will be provided on a weekly basis, covering required topics. Participant will state understanding/return demonstration of topics presented.  Learning Barriers/Preferences: Learning Barriers/Preferences - 07/01/17 1518      Learning Barriers/Preferences   Learning Barriers  None    Learning Preferences  Verbal Instruction;Skilled Demonstration;Group Instruction       Education Topics: How Lungs Work and Diseases: - Discuss the anatomy of the lungs and diseases that can affect the lungs, such as COPD.   Exercise: -Discuss the importance of exercise, FITT principles of exercise, normal and abnormal responses to exercise, and how to exercise safely.   PULMONARY REHAB OTHER RESPIRATORY from 08/28/2017 in [place]  Date  08/28/17  Educator  DC  Instruction Review Code  2- Demonstrated Understanding      Environmental Irritants: -Discuss types of environmental irritants and how to limit exposure to environmental irritants.   Meds/Inhalers and oxygen: - Discuss respiratory medications, definition of an inhaler and oxygen, and the proper way to use an inhaler and oxygen.   Energy Saving Techniques: - Discuss methods to conserve energy and decrease shortness of breath when performing activities of daily living.    Bronchial Hygiene / Breathing Techniques: - Discuss breathing mechanics, pursed-lip breathing technique,  proper posture, effective ways to clear airways, and other functional breathing techniques   Cleaning Equipment: - Provides group verbal and written instruction about the health  risks of elevated stress, cause of high stress, and healthy ways to reduce stress.   Nutrition I: Fats: - Discuss the types of cholesterol, what cholesterol does to the body, and how cholesterol levels can be controlled.   PULMONARY REHAB OTHER RESPIRATORY from 08/28/2017 in [place]  Date  07/17/17  Educator  DJ  Instruction Review Code  2- Demonstrated Understanding      Nutrition II: Labels: -Discuss the different components of food labels and how to read food labels.   PULMONARY REHAB OTHER RESPIRATORY from 08/28/2017 in [place]  Date  07/24/17  Educator  DJ  Instruction Review Code  2- Demonstrated Understanding      Respiratory Infections: - Discuss the signs and symptoms of respiratory infections, ways to prevent respiratory infections, and the importance of seeking medical treatment when having a respiratory infection.   PULMONARY REHAB OTHER RESPIRATORY from 08/28/2017 in [place]  Date  07/31/17  Educator  DJ  Instruction Review Code  2- Demonstrated Understanding      Stress I: Signs and Symptoms: -  Discuss the causes of stress, how stress may lead to anxiety and depression, and ways to limit stress.   PULMONARY REHAB OTHER RESPIRATORY from 08/28/2017 in [place]  Date  08/07/17  Educator  DJ  Instruction Review Code  2- Demonstrated Understanding      Stress II: Relaxation: -Discuss relaxation techniques to limit stress.   PULMONARY REHAB OTHER RESPIRATORY from 08/28/2017 in [place]  Date  08/14/17  Educator  DJ  Instruction Review Code  2- Demonstrated Understanding      Oxygen for Home/Travel: - Discuss how to prepare for travel when on oxygen and proper ways to transport and store oxygen to ensure safety.   PULMONARY REHAB OTHER RESPIRATORY from 08/28/2017 in [place]  Date  08/21/17  Educator  DC  Instruction  Review Code  2- Demonstrated Understanding      Knowledge Questionnaire Score: Knowledge Questionnaire Score - 07/01/17 1539      Knowledge Questionnaire Score   Pre Score  15/18       Core Components/Risk Factors/Patient Goals at Admission: Personal Goals and Risk Factors at Admission - 07/01/17 1600      Core Components/Risk Factors/Patient Goals on Admission    Weight Management  Yes    Intervention  Weight Management/Obesity: Establish reasonable short term and long term weight goals.;Obesity: Provide education and appropriate resources to help participant work on and attain dietary goals.    Admit Weight  214 lb 1.6 oz (97.1 kg)    Goal Weight: Short Term  204 lb 1.6 oz (92.6 kg)    Goal Weight: Long Term  194 lb 1.6 oz (88 kg)    Expected Outcomes  Short Term: Continue to assess and modify interventions until short term weight is achieved;Long Term: Adherence to nutrition and physical activity/exercise program aimed toward attainment of established weight goal    Personal Goal Other  Yes    Personal Goal  Better mobility, lose 25-30lbs    Intervention  Attend PR 2 x week and supplement at home exercise 3 x week.    Expected Outcomes  Reach personal goals.       Core Components/Risk Factors/Patient Goals Review:  Goals and Risk Factor Review    Row Name 07/31/17 1337 08/28/17 1521           Core Components/Risk Factors/Patient Goals Review   Personal Goals Review  Weight Management/Obesity;Increase knowledge of respiratory medications and ability to use respiratory devices properly.;Improve shortness of breath with ADL's Better mobility; lose weight 25-30 lbs;  Do yard work.   Weight Management/Obesity;Increase knowledge of respiratory medications and ability to use respiratory devices properly.;Improve shortness of breath with ADL's Better mobility; lose weight 25-30 lbs. Do yard work again.       Review  Patient has completed 7 sessions losing 3.5 lbs. She is doing well  in the program with some progression. She says it is too soon to tell if the program is making a difference. She hopes to meet her goals as she continues the program. Will continue to monitor for progress.   Patient has completed 15 sessions losing 4 lbs. She continues to do well in the program with progression. She says the program is helping her. She feels stronger and has more endurance to do things. She says she feels like she could do yard work but is taking a medication that does no allow to get out in the sun or sweat excessively. She is afraid to  try. She is very pleased with her progress. Will continue to monitor for progress.       Expected Outcomes  Patient will continue to attend sessions and complete the program.   Patient will continue to attend sessions and complete the program.          Core Components/Risk Factors/Patient Goals at Discharge (Final Review):  Goals and Risk Factor Review - 08/28/17 1521      Core Components/Risk Factors/Patient Goals Review   Personal Goals Review  Weight Management/Obesity;Increase knowledge of respiratory medications and ability to use respiratory devices properly.;Improve shortness of breath with ADL's Better mobility; lose weight 25-30 lbs. Do yard work again.     Review  Patient has completed 15 sessions losing 4 lbs. She continues to do well in the program with progression. She says the program is helping her. She feels stronger and has more endurance to do things. She says she feels like she could do yard work but is taking a medication that does no allow to get out in the sun or sweat excessively. She is afraid to try. She is very pleased with her progress. Will continue to monitor for progress.     Expected Outcomes  Patient will continue to attend sessions and complete the program.        ITP Comments: ITP Comments    Row Name 07/01/17 1543 07/03/17 1336         ITP Comments  Mrs. 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 She  has also been diagnosed with Myasthenia Gravis She is depressed according to her QOL scores. We will continue to address this area as she comes through our program.   Patient new to program. Plans to start Tuesday 07/08/17. Will continue to monitor for progress.          Comments: ITP 30 Day REVIEW Pt is making expected progress toward pulmonary rehab goals after completing 15 sessions. Recommend continued exercise, life style modification, education, and utilization of breathing techniques to increase stamina and strength and decrease shortness of breath with exertion.

## 2017-09-01 DIAGNOSIS — I10 Essential (primary) hypertension: Secondary | ICD-10-CM | POA: Diagnosis not present

## 2017-09-01 DIAGNOSIS — Z9989 Dependence on other enabling machines and devices: Secondary | ICD-10-CM | POA: Diagnosis not present

## 2017-09-01 DIAGNOSIS — G4733 Obstructive sleep apnea (adult) (pediatric): Secondary | ICD-10-CM | POA: Diagnosis not present

## 2017-09-01 DIAGNOSIS — I119 Hypertensive heart disease without heart failure: Secondary | ICD-10-CM | POA: Diagnosis not present

## 2017-09-02 ENCOUNTER — Encounter (HOSPITAL_COMMUNITY)
Admission: RE | Admit: 2017-09-02 | Discharge: 2017-09-02 | Disposition: A | Discharge status: Home / Self Care | Payer: PPO | Source: Ambulatory Visit | Attending: Internal Medicine | Admitting: Internal Medicine

## 2017-09-02 DIAGNOSIS — J849 Interstitial pulmonary disease, unspecified: Secondary | ICD-10-CM | POA: Diagnosis not present

## 2017-09-02 NOTE — Progress Notes (Signed)
Daily Session Note  Patient Details  Name: DAVIE SAGONA MRN: [id number] Date of Birth: [birth year] Referring Provider:     PULMONARY REHAB OTHER RESP ORIENTATION from 07/01/2017 in [place]  Referring Provider  Dr. Chase Caller       Encounter Date: 09/02/2017  Check In: Session Check In - 09/02/17 1121      Check-In   Location  [facility]    Staff Present  Diane Angelina Pih, MS, EP, [facility], Exercise Physiologist;Trinetta Alemu Luther Parody, BS, EP, Exercise Physiologist    Supervising physician immediately available to respond to emergencies  See telemetry face sheet for immediately available MD    Medication changes reported      No    Fall or balance concerns reported     Yes    Comments  Has fallen twice in the past 12 months.     Warm-up and Cool-down  Performed as group-led Higher education careers adviser Performed  Yes    VAD Patient?  No      Pain Assessment   Currently in Pain?  No/denies    Pain Score  0-No pain    Multiple Pain Sites  No       Capillary Blood Glucose: No results found for this or any previous visit (from the past 24 hour(s)).  Exercise Prescription Changes - 09/02/17 1000      Response to Exercise   Blood Pressure (Admit)  112/64    Blood Pressure (Exercise)  140/72    Blood Pressure (Exit)  148/70    Heart Rate (Admit)  63 bpm    Heart Rate (Exercise)  94 bpm    Heart Rate (Exit)  72 bpm    Oxygen Saturation (Admit)  97 %    Oxygen Saturation (Exercise)  97 %    Oxygen Saturation (Exit)  96 %    Rating of Perceived Exertion (Exercise)  13    Perceived Dyspnea (Exercise)  13    Duration  Progress to 30 minutes of  aerobic without signs/symptoms of physical distress    Intensity  THRR unchanged [phone]      Progression   Progression  Continue to progress workloads to maintain intensity without signs/symptoms of physical distress.      Resistance Training   Training Prescription  Yes    Weight  1    Reps  10-15       NuStep   Level  2    SPM  117    Minutes  20    METs  1.8      Arm Ergometer   Level  1.5    Watts  15    RPM  12    Minutes  15    METs  2.5      Home Exercise Plan   Plans to continue exercise at  Home (comment)    Frequency  Add 2 additional days to program exercise sessions.    Initial Home Exercises Provided  07/15/17       Social History   Tobacco Use  Smoking Status Never Smoker  Smokeless Tobacco Never Used    Goals Met:  Independence with exercise equipment Improved SOB with ADL's Using PLB without cueing & demonstrates good technique Exercise tolerated well No report of cardiac concerns or symptoms Strength training completed today  Goals Unmet:  Not Applicable  Comments: Check out 1145   Dr. Sinda Du is Medical Director for [facility] Pulmonary Rehab.

## 2017-09-04 ENCOUNTER — Encounter (HOSPITAL_COMMUNITY)
Admission: RE | Admit: 2017-09-04 | Discharge: 2017-09-04 | Disposition: A | Discharge status: Home / Self Care | Payer: PPO | Source: Ambulatory Visit | Attending: Internal Medicine | Admitting: Internal Medicine

## 2017-09-04 DIAGNOSIS — J849 Interstitial pulmonary disease, unspecified: Secondary | ICD-10-CM | POA: Diagnosis not present

## 2017-09-04 NOTE — Progress Notes (Signed)
Daily Session Note  Patient Details  Name: [name] MRN: [id number] Date of Birth: 17-Jun-1945 Referring Provider:     PULMONARY REHAB OTHER RESP ORIENTATION from 07/01/2017 in [place]  Referring Provider  Dr. Chase Caller       Encounter Date: 09/04/2017  Check In: Session Check In - 09/04/17 1045      Check-In   Location  [facility]    Staff Present  Terence Bart Angelina Pih, MS, EP, [facility], Exercise Physiologist;Debra Wynetta Emery, RN, BSN    Supervising physician immediately available to respond to emergencies  See telemetry face sheet for immediately available MD    Medication changes reported      No    Fall or balance concerns reported     Yes    Warm-up and Cool-down  Performed as group-led instruction    Resistance Training Performed  Yes    VAD Patient?  No      Pain Assessment   Currently in Pain?  No/denies    Pain Score  0-No pain    Multiple Pain Sites  No       Capillary Blood Glucose: No results found for this or any previous visit (from the past 24 hour(s)).    Social History   Tobacco Use  Smoking Status Never Smoker  Smokeless Tobacco Never Used    Goals Met:  Proper associated with RPD/PD & O2 Sat Independence with exercise equipment Improved SOB with ADL's Exercise tolerated well No report of cardiac concerns or symptoms Strength training completed today  Goals Unmet:  Not Applicable  Comments: Check out: 11:45   Dr. Sinda Du is Medical Director for [facility] Pulmonary Rehab.

## 2017-09-09 ENCOUNTER — Encounter (HOSPITAL_COMMUNITY)
Admission: RE | Admit: 2017-09-09 | Discharge: 2017-09-09 | Disposition: A | Discharge status: Home / Self Care | Payer: PPO | Source: Ambulatory Visit | Attending: Internal Medicine | Admitting: Internal Medicine

## 2017-09-09 DIAGNOSIS — J849 Interstitial pulmonary disease, unspecified: Secondary | ICD-10-CM | POA: Diagnosis not present

## 2017-09-09 NOTE — Progress Notes (Signed)
Daily Session Note  Patient Details  Name: [name] MRN: [id number] Date of Birth: [birth year] Referring Provider:     PULMONARY REHAB OTHER RESP ORIENTATION from 07/01/2017 in [place]  Referring Provider  Dr. Chase Caller       Encounter Date: 09/09/2017  Check In: Session Check In - 09/09/17 1121      Check-In   Location  [facility]    Staff Present  Maelee Hoot Angelina Pih, MS, EP, [facility], Exercise Physiologist;Gregory Luther Parody, BS, EP, Exercise Physiologist    Supervising physician immediately available to respond to emergencies  See telemetry face sheet for immediately available MD    Medication changes reported      No    Fall or balance concerns reported     No    Warm-up and Cool-down  Performed as group-led instruction    Resistance Training Performed  Yes    VAD Patient?  No      Pain Assessment   Currently in Pain?  No/denies    Pain Score  0-No pain    Multiple Pain Sites  No       Capillary Blood Glucose: No results found for this or any previous visit (from the past 24 hour(s)).    Social History   Tobacco Use  Smoking Status Never Smoker  Smokeless Tobacco Never Used    Goals Met:  Proper associated with RPD/PD & O2 Sat Improved SOB with ADL's Exercise tolerated well No report of cardiac concerns or symptoms Strength training completed today  Goals Unmet:  Not Applicable  Comments: Check out: 11:45   Dr. Sinda Du is Medical Director for [facility] Pulmonary Rehab.

## 2017-09-11 ENCOUNTER — Encounter (HOSPITAL_COMMUNITY)
Admission: RE | Admit: 2017-09-11 | Discharge: 2017-09-11 | Disposition: A | Discharge status: Home / Self Care | Payer: PPO | Source: Ambulatory Visit | Attending: Internal Medicine | Admitting: Internal Medicine

## 2017-09-11 DIAGNOSIS — J849 Interstitial pulmonary disease, unspecified: Secondary | ICD-10-CM | POA: Diagnosis not present

## 2017-09-11 NOTE — Progress Notes (Signed)
Daily Session Note  Patient Details  Name: [name] MRN: [id number] Date of Birth: 26-Aug-1945 Referring Provider:     PULMONARY REHAB OTHER RESP ORIENTATION from 07/01/2017 in [place]  Referring Provider  Dr. Chase Caller       Encounter Date: 09/11/2017  Check In: Session Check In - 09/11/17 1045      Check-In   Location  [facility]    Staff Present  Suzanne Boron, BS, EP, Exercise Physiologist;Karver Fadden Wynetta Emery, RN, BSN    Supervising physician immediately available to respond to emergencies  See telemetry face sheet for immediately available MD    Medication changes reported      No    Fall or balance concerns reported     Yes    Comments  Has fallen twice in the past 12 months.     Warm-up and Cool-down  Performed as group-led Higher education careers adviser Performed  Yes    VAD Patient?  No      Pain Assessment   Currently in Pain?  No/denies    Pain Score  0-No pain    Multiple Pain Sites  No       Capillary Blood Glucose: No results found for this or any previous visit (from the past 24 hour(s)).    Social History   Tobacco Use  Smoking Status Never Smoker  Smokeless Tobacco Never Used    Goals Met:  Proper associated with RPD/PD & O2 Sat Independence with exercise equipment Improved SOB with ADL's Using PLB without cueing & demonstrates good technique Exercise tolerated well No report of cardiac concerns or symptoms Strength training completed today  Goals Unmet:  Not Applicable  Comments:  Check out 1145.   Dr. Sinda Du is Medical Director for [facility] Pul[facility] Re[facility].

## 2017-09-16 ENCOUNTER — Encounter (HOSPITAL_COMMUNITY)
Admission: RE | Admit: 2017-09-16 | Discharge: 2017-09-16 | Disposition: A | Discharge status: Home / Self Care | Payer: PPO | Source: Ambulatory Visit | Attending: Internal Medicine | Admitting: Internal Medicine

## 2017-09-16 DIAGNOSIS — J849 Interstitial pulmonary disease, unspecified: Secondary | ICD-10-CM | POA: Diagnosis not present

## 2017-09-16 NOTE — Progress Notes (Signed)
Daily Session Note  Patient Details  Name: PAHOLA DIMMITT MRN: [id number] Date of Birth: 24-Jul-1945 Referring Provider:     PULMONARY REHAB OTHER RESP ORIENTATION from 07/01/2017 in [place]  Referring Provider  Dr. Chase Caller       Encounter Date: 09/16/2017  Check In: Session Check In - 09/16/17 1106      Check-In   Location  [facility]    Staff Present  Suzanne Boron, BS, EP, Exercise Physiologist;Debra Wynetta Emery, RN, BSN    Supervising physician immediately available to respond to emergencies  See telemetry face sheet for immediately available MD    Medication changes reported      No    Fall or balance concerns reported     Yes    Comments  Has fallen twice in the past 12 months.     Warm-up and Cool-down  Performed as group-led Higher education careers adviser Performed  Yes    VAD Patient?  No      Pain Assessment   Currently in Pain?  No/denies    Pain Score  0-No pain    Multiple Pain Sites  No       Capillary Blood Glucose: No results found for this or any previous visit (from the past 24 hour(s)).  Exercise Prescription Changes - 09/15/17 1400      Response to Exercise   Blood Pressure (Admit)  128/74    Blood Pressure (Exercise)  160/58    Blood Pressure (Exit)  112/54    Heart Rate (Admit)  78 bpm    Heart Rate (Exercise)  87 bpm    Heart Rate (Exit)  87 bpm    Oxygen Saturation (Admit)  96 %    Oxygen Saturation (Exercise)  98 %    Oxygen Saturation (Exit)  97 %    Rating of Perceived Exertion (Exercise)  13    Perceived Dyspnea (Exercise)  13    Duration  Progress to 30 minutes of  aerobic without signs/symptoms of physical distress    Intensity  THRR unchanged [id number]      Progression   Progression  Continue to progress workloads to maintain intensity without signs/symptoms of physical distress.      Resistance Training   Training Prescription  Yes    Weight  1    Reps  10-15      NuStep   Level   2    SPM  134    Minutes  30    METs  2      Home Exercise Plan   Plans to continue exercise at  Home (comment)    Frequency  Add 2 additional days to program exercise sessions.    Initial Home Exercises Provided  07/15/17       Social History   Tobacco Use  Smoking Status Never Smoker  Smokeless Tobacco Never Used    Goals Met:  Independence with exercise equipment Improved SOB with ADL's Using PLB without cueing & demonstrates good technique Exercise tolerated well No report of cardiac concerns or symptoms Strength training completed today  Goals Unmet:  Not Applicable  Comments: Check out 1145   Dr. Sinda Du is Medical Director for [facility] Pulmonary Rehab.

## 2017-09-17 DIAGNOSIS — S338XXA Sprain of other parts of lumbar spine and pelvis, initial encounter: Secondary | ICD-10-CM | POA: Diagnosis not present

## 2017-09-17 DIAGNOSIS — M47816 Spondylosis without myelopathy or radiculopathy, lumbar region: Secondary | ICD-10-CM | POA: Diagnosis not present

## 2017-09-17 DIAGNOSIS — M9903 Segmental and somatic dysfunction of lumbar region: Secondary | ICD-10-CM | POA: Diagnosis not present

## 2017-09-18 ENCOUNTER — Encounter (HOSPITAL_COMMUNITY)
Admission: RE | Admit: 2017-09-18 | Discharge: 2017-09-18 | Disposition: A | Discharge status: Home / Self Care | Payer: PPO | Source: Ambulatory Visit | Attending: Internal Medicine | Admitting: Internal Medicine

## 2017-09-18 DIAGNOSIS — J849 Interstitial pulmonary disease, unspecified: Secondary | ICD-10-CM | POA: Diagnosis not present

## 2017-09-18 NOTE — Progress Notes (Signed)
Pulmonary Individual Treatment Plan  Patient Details  Name: [name] MRN: [id number] Date of Birth: May 07, 1945 Referring Provider:     PULMONARY REHAB OTHER RESP ORIENTATION from 07/01/2017 in [place]  Referring Provider  Dr. Chase Caller       Initial Encounter Date:    [place] from 07/01/2017 in [place]  Date  07/01/17  Referring Provider  Dr. Chase Caller       Visit Diagnosis: ILD (interstitial lung disease) ([place])  Patient's Home Medications on Admission:   Current Outpatient Medications:  .  ACCU-CHEK AVIVA PLUS test strip, Apply 1 strip topically daily. for testing, Disp: , Rfl: 1 .  ACCU-CHEK SOFTCLIX LANCETS lancets, TEST DAILY E11.9, Disp: , Rfl: 1 .  amoxicillin (AMOXIL) 875 MG tablet, Take 875 mg by mouth 2 (two) times daily., Disp: , Rfl:  .  APPLE CIDER VINEGAR PO, Take by mouth daily. , Disp: , Rfl:  .  aspirin EC 81 MG tablet, Take 81 mg by mouth daily., Disp: , Rfl:  .  Calcium Carb-Cholecalciferol (CALCIUM+D3) 600-800 MG-UNIT TABS, Take 1 tablet by mouth daily., Disp: , Rfl:  .  cetirizine (ZYRTEC) 10 MG tablet, Take 10 mg by mouth daily., Disp: , Rfl: 0 .  co-enzyme Q-10 50 MG capsule, Take 100 mg by mouth daily., Disp: , Rfl:  .  [employer] NATURAL LUTEIN EYE HEALTH PO, Take 1 capsule by mouth daily. , Disp: , Rfl:  .  diclofenac sodium (VOLTAREN) 1 % GEL, Apply 1 application topically 2 (two) times daily., Disp: , Rfl:  .  fluticasone (FLONASE) 50 MCG/ACT nasal spray, Place 2 sprays into both nostrils daily. , Disp: , Rfl:  .  gabapentin (NEURONTIN) 100 MG capsule, Take 100 mg by mouth 2 (two) times daily., Disp: , Rfl:  .  gabapentin (NEURONTIN) 300 MG capsule, Take by mouth daily. , Disp: , Rfl:  .  Ginger, Zingiber officinalis, (GINGER ROOT) 550 MG CAPS, Take 1 capsule by mouth daily., Disp: , Rfl:  .  glipiZIDE (GLUCOTROL XL) 10 MG 24 hr tablet, TAKE 1 TABLET BY MOUTH AFTER LUNCH AND  SUPPER, Disp: , Rfl:  .  levocarnitine (CARNITOR) 250 MG capsule, Take 500 mg by mouth daily., Disp: , Rfl:  .  losartan-hydrochlorothiazide (HYZAAR) 100-25 MG tablet, TAKE (1) TABLET BY MOUTH ONCE DAILY., Disp: , Rfl:  .  metFORMIN (GLUCOPHAGE-XR) 500 MG 24 hr tablet, TAKE 2 TABLETS TWICE DAILY AS DIRECTED., Disp: , Rfl:  .  Misc Natural Products (TART CHERRY ADVANCED) CAPS, Take by mouth., Disp: , Rfl:  .  Misc. Devices MISC, Avivia Plus lancets and strips. Tests daily  DX E11.9, Disp: , Rfl:  .  naproxen sodium (ANAPROX) 550 MG tablet, , Disp: , Rfl:  .  Nutritional Supplements (EQ ESTROBLEND MENOPAUSE) TABS, Take 1 tablet by mouth daily., Disp: , Rfl:  .  omega-3 acid ethyl esters (LOVAZA) 1 g capsule, Take 1 g by mouth daily., Disp: , Rfl:  .  pregabalin (LYRICA) 50 MG capsule, Take 50 mg by mouth 2 (two) times daily. , Disp: , Rfl:  .  pyridostigmine (MESTINON) 60 MG tablet, Takes 1/2 tablet four times per day, Disp: 120 tablet, Rfl: 6 .  rosuvastatin (CRESTOR) 5 MG tablet, Take 5 mg by mouth daily at 6 PM., Disp: , Rfl:  .  sitaGLIPtin (JANUVIA) 100 MG tablet, TAKE (1) TABLET BY MOUTH ONCE DAILY., Disp: , Rfl:  .  Turmeric Curcumin 500 MG  CAPS, Take 1 capsule by mouth daily., Disp: , Rfl:   Past Medical History: Past Medical History:  Diagnosis Date  . Arthritis   . Bursitis   . Diabetes mellitus without complication ([place])   . Fibromyalgia   . GERD (gastroesophageal reflux disease)   . Hyperlipemia   . Hypertension   . Sinusitis   . Sleep apnea    uses a cpap  . Sleep apnea   . Wears glasses     Tobacco Use: Social History   Tobacco Use  Smoking Status Never Smoker  Smokeless Tobacco Never Used    Labs: Recent Review Scientist, physiological    Labs for ITP Cardiac and Pulmonary Rehab Latest Ref Rng & Units 01/07/2011   TCO2 0 - 100 mmol/L 25      Capillary Blood Glucose: Lab Results  Component Value Date   GLUCAP 140 (H) 09/09/2012     Pulmonary Assessment  Scores: Pulmonary Assessment Scores    Row Name 07/01/17 1540         ADL UCSD   ADL Phase  Entry     SOB Score total  33     Rest  1     Walk  10     Stairs  4     Bath  0     Dress  0     Shop  0       CAT Score   CAT Score  11       mMRC Score   mMRC Score  3        Pulmonary Function Assessment: Pulmonary Function Assessment - 07/01/17 1539      Pulmonary Function Tests   FVC%  93 %    FEV1%  110 %    FEV1/FVC Ratio  117    RV%  35 %    DLCO%  71 %      Breath   Bilateral Breath Sounds  Clear    Shortness of Breath  Yes       Exercise Target Goals:    Exercise Program Goal: Individual exercise prescription set using results from initial 6 min walk test and THRR while considering  patient's activity barriers and safety.   Exercise Prescription Goal: Initial exercise prescription builds to 30-45 minutes a day of aerobic activity, 2-3 days per week.  Home exercise guidelines will be given to patient during program as part of exercise prescription that the participant will acknowledge.  Activity Barriers & Risk Stratification: Activity Barriers & Cardiac Risk Stratification - 07/01/17 1308      Activity Barriers & Cardiac Risk Stratification   Activity Barriers  Neck/Spine Problems;History of Falls;Deconditioning;Balance Concerns;Assistive Device;Shortness of Breath    Cardiac Risk Stratification  High       6 Minute Walk: 6 Minute Walk    Row Name 07/01/17 1411         6 Minute Walk   Phase  Initial     Distance  950 feet     Distance % Change  0 %     Distance Feet Change  0 ft     Walk Time  6 minutes     # of Rest Breaks  0     MPH  1.79     METS  2.37     RPE  11     Perceived Dyspnea   13     VO2 Peak  8.17     Symptoms  No  Resting HR  82 bpm     Resting BP  132/64     Resting Oxygen Saturation   97 %     Exercise Oxygen Saturation  during 6 min walk  97 %     Max Ex. HR  112 bpm     Max Ex. BP  180/76     2 Minute Post BP   136/64        Oxygen Initial Assessment: Oxygen Initial Assessment - 07/01/17 1556      Home Oxygen   Home Oxygen Device  None    Sleep Oxygen Prescription  None    Home Exercise Oxygen Prescription  None    Home at Rest Exercise Oxygen Prescription  None      Initial 6 min Walk   Oxygen Used  None      Program Oxygen Prescription   Program Oxygen Prescription  None       Oxygen Re-Evaluation: Oxygen Re-Evaluation    Row Name 07/31/17 1335 08/28/17 1518 09/18/17 1501         Program Oxygen Prescription   Program Oxygen Prescription  None  None  None       Home Oxygen   Home Oxygen Device  None  None  None     Sleep Oxygen Prescription  None  None  None     Home Exercise Oxygen Prescription  None  None  None     Home at Rest Exercise Oxygen Prescription  None  None  None       Goals/Expected Outcomes   Short Term Goals  To learn and understand importance of monitoring SPO2 with pulse oximeter and demonstrate accurate use of the pulse oximeter.;To learn and understand importance of maintaining oxygen saturations>88%;To learn and demonstrate proper pursed lip breathing techniques or other breathing techniques.  To learn and understand importance of monitoring SPO2 with pulse oximeter and demonstrate accurate use of the pulse oximeter.;To learn and understand importance of maintaining oxygen saturations>88%;To learn and demonstrate proper pursed lip breathing techniques or other breathing techniques.  To learn and understand importance of monitoring SPO2 with pulse oximeter and demonstrate accurate use of the pulse oximeter.;To learn and understand importance of maintaining oxygen saturations>88%;To learn and demonstrate proper pursed lip breathing techniques or other breathing techniques.     Long  Term Goals  Verbalizes importance of monitoring SPO2 with pulse oximeter and return demonstration;Maintenance of O2 saturations>88%;Exhibits proper breathing techniques, such as  pursed lip breathing or other method taught during program session  Verbalizes importance of monitoring SPO2 with pulse oximeter and return demonstration;Maintenance of O2 saturations>88%;Exhibits proper breathing techniques, such as pursed lip breathing or other method taught during program session  Verbalizes importance of monitoring SPO2 with pulse oximeter and return demonstration;Maintenance of O2 saturations>88%;Exhibits proper breathing techniques, such as pursed lip breathing or other method taught during program session     Comments  Patient demonstrates proper use of pulse oximeter with return demonstration and she verbalizes the importance of maintaining O2 saturations >88%. She also demonstrates proper pursed lip breathing during the program.   Patient demonstrates proper use of pulse oximeter with return demonstration and she verbalizes the importance of maintaining O2 saturations >88%. She also demonstrates proper pursed lip breathing during the program.   Patient demonstrates proper use of pulse oximeter with return demonstration and she verbalizes the importance of maintaining O2 saturations >88%. She also demonstrates proper pursed lip breathing during the program.      Goals/Expected  Outcomes  Patient will continues to meet her short and long term goals.   Patient will continues to meet her short and long term goals.   Patient will continues to meet her short and long term goals.         Oxygen Discharge (Final Oxygen Re-Evaluation): Oxygen Re-Evaluation - 09/18/17 1501      Program Oxygen Prescription   Program Oxygen Prescription  None      Home Oxygen   Home Oxygen Device  None    Sleep Oxygen Prescription  None    Home Exercise Oxygen Prescription  None    Home at Rest Exercise Oxygen Prescription  None      Goals/Expected Outcomes   Short Term Goals  To learn and understand importance of monitoring SPO2 with pulse oximeter and demonstrate accurate use of the pulse  oximeter.;To learn and understand importance of maintaining oxygen saturations>88%;To learn and demonstrate proper pursed lip breathing techniques or other breathing techniques.    Long  Term Goals  Verbalizes importance of monitoring SPO2 with pulse oximeter and return demonstration;Maintenance of O2 saturations>88%;Exhibits proper breathing techniques, such as pursed lip breathing or other method taught during program session    Comments  Patient demonstrates proper use of pulse oximeter with return demonstration and she verbalizes the importance of maintaining O2 saturations >88%. She also demonstrates proper pursed lip breathing during the program.     Goals/Expected Outcomes  Patient will continues to meet her short and long term goals.        Initial Exercise Prescription: Initial Exercise Prescription - 07/01/17 1300      Date of Initial Exercise RX and Referring Provider   Date  07/01/17    Referring Provider  Dr. Chase Caller       NuStep   Level  2    SPM  66    Minutes  15    METs  1.4      Arm Ergometer   Level  1.2    Watts  12    RPM  12    Minutes  20    METs  1.9      Prescription Details   Frequency (times per week)  3    Duration  Progress to 30 minutes of continuous aerobic without signs/symptoms of physical distress      Intensity   THRR 40-80% of Max Heartrate  [phone]    Ratings of Perceived Exertion  11-13    Perceived Dyspnea  0-4      Progression   Progression  Continue progressive overload as per policy without signs/symptoms or physical distress.      Resistance Training   Training Prescription  Yes    Weight  1    Reps  10-15       Perform Capillary Blood Glucose checks as needed.  Exercise Prescription Changes:  Exercise Prescription Changes    Row Name 07/17/17 0700 07/25/17 1400 08/20/17 1500 09/02/17 1000 09/15/17 1400     Response to Exercise   Blood Pressure (Admit)  120/62  116/50  118/58  112/64  128/74   Blood Pressure  (Exercise)  180/80  180/64  170/62  140/72  160/58   Blood Pressure (Exit)  148/82  100/50  130/70  148/70  112/54   Heart Rate (Admit)  78 bpm  80 bpm  80 bpm  63 bpm  78 bpm   Heart Rate (Exercise)  94 bpm  95 bpm  96 bpm  94 bpm  87 bpm   Heart Rate (Exit)  69 bpm  77 bpm  74 bpm  72 bpm  87 bpm   Oxygen Saturation (Admit)  98 %  98 %  96 %  97 %  96 %   Oxygen Saturation (Exercise)  93 %  97 %  97 %  97 %  98 %   Oxygen Saturation (Exit)  96 %  96 %  97 %  96 %  97 %   Rating of Perceived Exertion (Exercise)  '13  13  13  13  13   '$ Perceived Dyspnea (Exercise)  '13  13  13  13  13   '$ Duration  Progress to 30 minutes of  aerobic without signs/symptoms of physical distress  Progress to 30 minutes of  aerobic without signs/symptoms of physical distress  Progress to 30 minutes of  aerobic without signs/symptoms of physical distress  Progress to 30 minutes of  aerobic without signs/symptoms of physical distress  Progress to 30 minutes of  aerobic without signs/symptoms of physical distress   Intensity  THRR New [id number]  THRR New 108-122-136  THRR unchanged 108-122-136  THRR unchanged 98-115-133  THRR unchanged 107-121-136     Progression   Progression  Continue to progress workloads to maintain intensity without signs/symptoms of physical distress.  Continue to progress workloads to maintain intensity without signs/symptoms of physical distress.  Continue to progress workloads to maintain intensity without signs/symptoms of physical distress.  Continue to progress workloads to maintain intensity without signs/symptoms of physical distress.  Continue to progress workloads to maintain intensity without signs/symptoms of physical distress.     Resistance Training   Training Prescription  Yes  Yes  Yes  Yes  Yes   Weight  '1  1  1  1  1   '$ Reps  10-15  10-15  10-15  10-15  10-15     NuStep   Level  '2  2  2  2  2   '$ SPM  81  97  120  117  134   Minutes  '20  20  20  20  30   '$ METs  1.7  1.7  1.8  1.8   2     Arm Ergometer   Level  1  1.3  1.4  1.5  -   Watts  '1  3  14  15  '$ -   RPM  '12  12  12  12  '$ -   Minutes  '15  15  15  15  '$ -   METs  1  1.3  2.3  2.5  -     Home Exercise Plan   Plans to continue exercise at  Home (comment)  Home (comment)  Home (comment)  Home (comment)  Home (comment)   Frequency  Add 2 additional days to program exercise sessions.  Add 2 additional days to program exercise sessions.  Add 2 additional days to program exercise sessions.  Add 2 additional days to program exercise sessions.  Add 2 additional days to program exercise sessions.   Initial Home Exercises Provided  07/15/17  07/15/17  07/15/17  07/15/17  07/15/17      Exercise Comments:  Exercise Comments    Row Name 07/17/17 0751 07/25/17 1429 08/20/17 1504 09/02/17 1013 09/15/17 1453   Exercise Comments  Patient has just started PR and will slowly be progressed in time.   Patient is doing very well in PR even on only  her 5th session she has progressed on the arm ergometer to level 1.3. Patient has not been in the program long enough to see progress however she will continued to be monitored.   Patient is doing very well in PR she has increased her SPMs on the nustep and her level on the arm ergometer and she has gottn stronger since the program began. Patient enjoys the time with friends that she has made.   Patient continues to do well in PR. Patient has increased her level on the arm ergometer and has been keeping her SPMs high on the nustep machine. Patient continues to exercise with little to no SOB.   Patient continues to do well in PR. Patient has progressed in [facility] on the nustep and is doing well on the arm ergometer when she can use it due to our broken machine and busy class. Patient has stated to me that she has increased strength and stamina since beginnin the program and that she truly enjoys it      Exercise Goals and Review:  Exercise Goals    Row Name 07/01/17 1313             Exercise  Goals   Increase Physical Activity  Yes       Intervention  Provide advice, education, support and counseling about physical activity/exercise needs.;Develop an individualized exercise prescription for aerobic and resistive training based on initial evaluation findings, risk stratification, comorbidities and participant's personal goals.       Expected Outcomes  Short Term: Attend rehab on a regular basis to increase amount of physical activity.       Increase Strength and Stamina  Yes       Intervention  Develop an individualized exercise prescription for aerobic and resistive training based on initial evaluation findings, risk stratification, comorbidities and participant's personal goals.;Provide advice, education, support and counseling about physical activity/exercise needs.       Expected Outcomes  Short Term: Increase workloads from initial exercise prescription for resistance, speed, and METs.       Able to understand and use rate of perceived exertion (RPE) scale  Yes       Intervention  Provide education and explanation on how to use RPE scale       Expected Outcomes  Short Term: Able to use RPE daily in rehab to express subjective intensity level;Long Term:  Able to use RPE to guide intensity level when exercising independently       Able to understand and use Dyspnea scale  Yes       Intervention  Provide education and explanation on how to use Dyspnea scale       Expected Outcomes  Short Term: Able to use Dyspnea scale daily in rehab to express subjective sense of shortness of breath during exertion;Long Term: Able to use Dyspnea scale to guide intensity level when exercising independently       Knowledge and understanding of Target Heart Rate Range (THRR)  Yes       Intervention  Provide education and explanation of THRR including how the numbers were predicted and where they are located for reference       Expected Outcomes  Short Term: Able to state/look up THRR;Long Term: Able to use  THRR to govern intensity when exercising independently;Short Term: Able to use daily as guideline for intensity in rehab       Able to check pulse independently  Yes       Intervention  Provide education and demonstration on how to check pulse in carotid and radial arteries.;Review the importance of being able to check your own pulse for safety during independent exercise       Expected Outcomes  Short Term: Able to explain why pulse checking is important during independent exercise;Long Term: Able to check pulse independently and accurately       Understanding of Exercise Prescription  Yes       Intervention  Provide education, explanation, and written materials on patient's individual exercise prescription       Expected Outcomes  Short Term: Able to explain program exercise prescription;Long Term: Able to explain home exercise prescription to exercise independently          Exercise Goals Re-Evaluation : Exercise Goals Re-Evaluation    Row Name 07/25/17 1428 08/25/17 1454 09/15/17 1452         Exercise Goal Re-Evaluation   Exercise Goals Review  Increase Physical Activity;Increase Strength and Stamina;Able to understand and use Dyspnea scale;Knowledge and understanding of Target Heart Rate Range (THRR);Able to understand and use rate of perceived exertion (RPE) scale;Able to check pulse independently  Increase Physical Activity;Increase Strength and Stamina;Able to understand and use Dyspnea scale;Knowledge and understanding of Target Heart Rate Range (THRR);Able to understand and use rate of perceived exertion (RPE) scale;Able to check pulse independently  Increase Physical Activity;Increase Strength and Stamina;Able to understand and use Dyspnea scale;Knowledge and understanding of Target Heart Rate Range (THRR);Able to understand and use rate of perceived exertion (RPE) scale;Able to check pulse independently     Comments  Patient is doing very well in PR even on only her 5th session she has  progressed on the arm ergometer to level 1.3. Patient has not been in the program long enough to see progress however she will continued to be monitored.   Patient is doing well in PR and has maintained her levels on the two machines the arm ergometer and the nustep machine. Patient seems to be enjoying the program and looks to have gained some mobility from the program. Patient will continued to be monitored throughout the remainder of the program.   Patient continues to do well in PR. Patient has progressed in [facility] on the nustep and is doing well on the arm ergometer when she can use it due to our broken machine and busy class. Patient has stated to me that she has increased strength and stamina since beginnin the program and that she truly enjoys it     Expected Outcomes  Patient wishes to have better mobility and to lose weight.   Patient wishes to have better mobility and to lose weight.   Patient wishes to have better mobility and to lose weight.         Discharge Exercise Prescription (Final Exercise Prescription Changes): Exercise Prescription Changes - 09/15/17 1400      Response to Exercise   Blood Pressure (Admit)  128/74    Blood Pressure (Exercise)  160/58    Blood Pressure (Exit)  112/54    Heart Rate (Admit)  78 bpm    Heart Rate (Exercise)  87 bpm    Heart Rate (Exit)  87 bpm    Oxygen Saturation (Admit)  96 %    Oxygen Saturation (Exercise)  98 %    Oxygen Saturation (Exit)  97 %    Rating of Perceived Exertion (Exercise)  13    Perceived Dyspnea (Exercise)  13    Duration  Progress to 30  minutes of  aerobic without signs/symptoms of physical distress    Intensity  THRR unchanged 107-121-136      Progression   Progression  Continue to progress workloads to maintain intensity without signs/symptoms of physical distress.      Resistance Training   Training Prescription  Yes    Weight  1    Reps  10-15      NuStep   Level  2    SPM  134    Minutes  30    METs  2       Home Exercise Plan   Plans to continue exercise at  Home (comment)    Frequency  Add 2 additional days to program exercise sessions.    Initial Home Exercises Provided  07/15/17       Nutrition:  Target Goals: Understanding of nutrition guidelines, daily intake of sodium '1500mg'$ , cholesterol '200mg'$ , calories 30% from fat and 7% or less from saturated fats, daily to have 5 or more servings of fruits and vegetables.  Biometrics: Pre Biometrics - 07/01/17 1313      Pre Biometrics   Height  '5\' 5"'$  (1.651 m)    Weight  214 lb 1.6 oz (97.1 kg)    Waist Circumference  48.5 inches    Hip Circumference  47 inches    Waist to Hip Ratio  1.03 %    BMI (Calculated)  35.63    Triceps Skinfold  26 mm    % Body Fat  47.6 %    Grip Strength  28.93 kg    Flexibility  0 in    Single Leg Stand  0 seconds        Nutrition Therapy Plan and Nutrition Goals: Nutrition Therapy & Goals - 07/31/17 1526      Personal Nutrition Goals   Nutrition Goal  For heart healthy choices add >50% of whole grains, make half their plate fruits and vegetables. Discuss the difference between starchy vegetables and leafy greens, and how leafy vegetables provide fiber, helps maintain healthy weight, helps control blood glucose, and lowers cholesterol.  Discuss purchasing fresh or frozen vegetable to reduce sodium and not to add grease, fat or sugar. Consume <18oz of red meat per week. Consume lean cuts of meats and very little of meats high in sodium and nitrates such as pork and lunch meats. Discussed portion control for all food groups.      Additional Goals?  No    Comments  Patient met with RD 07/31/17.      Intervention Plan   Intervention  Nutrition handout(s) given to patient.    Expected Outcomes  Short Term Goal: A plan has been developed with personal nutrition goals set during dietitian appointment.;Long Term Goal: Adherence to prescribed nutrition plan.       Nutrition Assessments: Nutrition Assessments -  07/01/17 1600      MEDFICTS Scores   Pre Score  62       Nutrition Goals Re-Evaluation: Nutrition Goals Re-Evaluation    [place] Name 08/28/17 1518 09/18/17 1501           Goals   Current Weight  208 lb 8 oz (94.6 kg)  211 lb 4.8 oz (95.8 kg)      Nutrition Goal  For heart healthy choices add >50% of whole grains, make half their plate fruits and vegetables. Discuss the difference between starchy vegetables and leafy greens, and how leafy vegetables provide fiber, helps maintain healthy weight, helps control blood glucose,  and lowers cholesterol.  Discuss purchasing fresh or frozen vegetable to reduce sodium and not to add grease, fat or sugar. Consume <18oz of red meat per week. Consume lean cuts of meats and very little of meats high in sodium and nitrates such as pork and lunch meats. Discussed portion control for all food groups.    For heart healthy choices add >50% of whole grains, make half their plate fruits and vegetables. Discuss the difference between starchy vegetables and leafy greens, and how leafy vegetables provide fiber, helps maintain healthy weight, helps control blood glucose, and lowers cholesterol.  Discuss purchasing fresh or frozen vegetable to reduce sodium and not to add grease, fat or sugar. Consume <18oz of red meat per week. Consume lean cuts of meats and very little of meats high in sodium and nitrates such as pork and lunch meats. Discussed portion control for all food groups.        Comment  Patient has lost 5.6 lbs since last 30 day review. 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 Will continue to monitor for progress.   Patient has gained 3 lbs since last 30 day review. She continues to follow a regular diet. Will continue to monitor for progress.       Expected Outcome  Patient will continue to lose weight.   Patient will continue to lose weight.         Personal Goal #2 Re-Evaluation   Personal Goal #2  Patient eats a regular diet.   Patient eats a regular diet.           Nutrition Goals Discharge (Final Nutrition Goals Re-Evaluation): Nutrition Goals Re-Evaluation - 09/18/17 1501      Goals   Current Weight  211 lb 4.8 oz (95.8 kg)    Nutrition Goal  For heart healthy choices add >50% of whole grains, make half their plate fruits and vegetables. Discuss the difference between starchy vegetables and leafy greens, and how leafy vegetables provide fiber, helps maintain healthy weight, helps control blood glucose, and lowers cholesterol.  Discuss purchasing fresh or frozen vegetable to reduce sodium and not to add grease, fat or sugar. Consume <18oz of red meat per week. Consume lean cuts of meats and very little of meats high in sodium and nitrates such as pork and lunch meats. Discussed portion control for all food groups.      Comment  Patient has gained 3 lbs since last 30 day review. She continues to follow a regular diet. Will continue to monitor for progress.     Expected Outcome  Patient will continue to lose weight.       Personal Goal #2 Re-Evaluation   Personal Goal #2  Patient eats a regular diet.        Psychosocial: Target Goals: Acknowledge presence or absence of significant depression and/or stress, maximize coping skills, provide positive support system. Participant is able to verbalize types and ability to use techniques and skills needed for reducing stress and depression.  Initial Review & Psychosocial Screening: Initial Psych Review & Screening - 07/01/17 1603      Initial Review   Current issues with  Current Depression;Current Sleep Concerns      Family Dynamics   Good Support System?  Yes      Barriers   Psychosocial barriers to participate in program  Psychosocial barriers identified (see note) QOL score indicated depression at 15.56 overall.      Screening Interventions   Interventions  Encouraged to exercise;Provide  feedback about the scores to participant    Expected Outcomes  Short Term goal: Identification and review  with participant of any Quality of Life or Depression concerns found by scoring the questionnaire.;Long Term goal: The participant improves quality of Life and PHQ9 Scores as seen by post scores and/or verbalization of changes       Quality of Life Scores: Quality of Life - 07/01/17 1314      Quality of Life Scores   Health/Function Pre  13.27 %    Socioeconomic Pre  11.33 %    Psych/Spiritual Pre  18.07 %    Family Pre  24 %    GLOBAL Pre  15.56 %      Scores of 19 and below usually indicate a poorer quality of life in these areas.  A difference of  2-3 points is a clinically meaningful difference.  A difference of 2-3 points in the total score of the Quality of Life Index has been associated with significant improvement in overall quality of life, self-image, physical symptoms, and general health in studies assessing change in quality of life.   PHQ-9: Recent Review Flowsheet Data    Depression screen [facility] 2/9 07/01/2017   Decreased Interest 0   Down, Depressed, Hopeless 0   PHQ - 2 Score 0   Altered sleeping 2   Tired, decreased energy 3   Change in appetite 1   Feeling bad or failure about yourself  0   Trouble concentrating 0   Moving slowly or fidgety/restless 0   Suicidal thoughts 0   PHQ-9 Score 6   Difficult doing work/chores Somewhat difficult     Interpretation of Total Score  Total Score Depression Severity:  1-4 = Minimal depression, 5-9 = Mild depression, 10-14 = Moderate depression, 15-19 = Moderately severe depression, 20-27 = Severe depression   Psychosocial Evaluation and Intervention: Psychosocial Evaluation - 07/01/17 1604      Psychosocial Evaluation & Interventions   Interventions  Encouraged to exercise with the program and follow exercise prescription    Comments  Patient does not want to be referred to a therapist.     Continue Psychosocial Services   Follow up required by staff       Psychosocial Re-Evaluation: Psychosocial Re-Evaluation     [place] Name 07/31/17 1342 08/28/17 1523 09/18/17 1505         Psychosocial Re-Evaluation   Current issues with  Current Depression;Current Sleep Concerns  Current Depression;Current Sleep Concerns  Current Depression;Current Sleep Concerns     Comments  Patient's initial QOL score was 15.56 and her PHQ-9 score was 6 indicating depression. She says her does not feel she needs treatment at this time.   Patient's initial QOL score was 15.56 and her PHQ-9 score was 6 indicating depression. She says her does not feel she needs treatment at this time.   Patient's initial QOL score was 15.56 and her PHQ-9 score was 6 indicating depression. She says her does not feel she needs treatment at this time. Still reports difficulty sleeping.      Expected Outcomes  Patient's QOL and PHQ-9 scores will improve or remain the same at discharge and she will have no additional psychosocial issues identified.   Patient's QOL and PHQ-9 scores will improve or remain the same at discharge and she will have no additional psychosocial issues identified.   Patient's QOL and PHQ-9 scores will improve or remain the same at discharge and she will have no additional psychosocial issues identified.  Interventions  Stress management education;Encouraged to attend Cardiac Rehabilitation for the exercise;Relaxation education  Stress management education;Encouraged to attend Cardiac Rehabilitation for the exercise;Relaxation education  Stress management education;Encouraged to attend Cardiac Rehabilitation for the exercise;Relaxation education     Continue Psychosocial Services   No Follow up required  No Follow up required  No Follow up required        Psychosocial Discharge (Final Psychosocial Re-Evaluation): Psychosocial Re-Evaluation - 09/18/17 1505      Psychosocial Re-Evaluation   Current issues with  Current Depression;Current Sleep Concerns    Comments  Patient's initial QOL score was 15.56 and her PHQ-9 score was 6  indicating depression. She says her does not feel she needs treatment at this time. Still reports difficulty sleeping.     Expected Outcomes  Patient's QOL and PHQ-9 scores will improve or remain the same at discharge and she will have no additional psychosocial issues identified.     Interventions  Stress management education;Encouraged to attend Cardiac Rehabilitation for the exercise;Relaxation education    Continue Psychosocial Services   No Follow up required        Education: Education Goals: Education classes will be provided on a weekly basis, covering required topics. Participant will state understanding/return demonstration of topics presented.  Learning Barriers/Preferences: Learning Barriers/Preferences - 07/01/17 1518      Learning Barriers/Preferences   Learning Barriers  None    Learning Preferences  Verbal Instruction;Skilled Demonstration;Group Instruction       Education Topics: How Lungs Work and Diseases: - Discuss the anatomy of the lungs and diseases that can affect the lungs, such as COPD.   PULMONARY REHAB OTHER RESPIRATORY from 09/18/2017 in [place]  Date  09/04/17  Educator  Etheleen Mayhew  Instruction Review Code  2- Demonstrated Understanding      Exercise: -Discuss the importance of exercise, FITT principles of exercise, normal and abnormal responses to exercise, and how to exercise safely.   PULMONARY REHAB OTHER RESPIRATORY from 09/18/2017 in [place]  Date  08/28/17  Educator  DC  Instruction Review Code  2- Demonstrated Understanding      Environmental Irritants: -Discuss types of environmental irritants and how to limit exposure to environmental irritants.   PULMONARY REHAB OTHER RESPIRATORY from 09/18/2017 in [place]  Date  09/11/17  Educator  DJ  Instruction Review Code  2- Demonstrated Understanding      Meds/Inhalers and oxygen: - Discuss respiratory  medications, definition of an inhaler and oxygen, and the proper way to use an inhaler and oxygen.   PULMONARY REHAB OTHER RESPIRATORY from 09/18/2017 in [place]  Date  09/18/17  Educator  DC      Energy Saving Techniques: - Discuss methods to conserve energy and decrease shortness of breath when performing activities of daily living.    Bronchial Hygiene / Breathing Techniques: - Discuss breathing mechanics, pursed-lip breathing technique,  proper posture, effective ways to clear airways, and other functional breathing techniques   Cleaning Equipment: - Provides group verbal and written instruction about the health risks of elevated stress, cause of high stress, and healthy ways to reduce stress.   Nutrition I: Fats: - Discuss the types of cholesterol, what cholesterol does to the body, and how cholesterol levels can be controlled.   PULMONARY REHAB OTHER RESPIRATORY from 09/18/2017 in [place]  Date  07/17/17  Educator  DJ  Instruction Review Code  2- Demonstrated Understanding  Nutrition II: Labels: -Discuss the different components of food labels and how to read food labels.   PULMONARY REHAB OTHER RESPIRATORY from 09/18/2017 in [place]  Date  07/24/17  Educator  DJ  Instruction Review Code  2- Demonstrated Understanding      Respiratory Infections: - Discuss the signs and symptoms of respiratory infections, ways to prevent respiratory infections, and the importance of seeking medical treatment when having a respiratory infection.   PULMONARY REHAB OTHER RESPIRATORY from 09/18/2017 in [place]  Date  07/31/17  Educator  DJ  Instruction Review Code  2- Demonstrated Understanding      Stress I: Signs and Symptoms: - Discuss the causes of stress, how stress may lead to anxiety and depression, and ways to limit stress.   PULMONARY REHAB OTHER RESPIRATORY from 09/18/2017  in [place]  Date  08/07/17  Educator  DJ  Instruction Review Code  2- Demonstrated Understanding      Stress II: Relaxation: -Discuss relaxation techniques to limit stress.   PULMONARY REHAB OTHER RESPIRATORY from 09/18/2017 in [place]  Date  08/14/17  Educator  DJ  Instruction Review Code  2- Demonstrated Understanding      Oxygen for Home/Travel: - Discuss how to prepare for travel when on oxygen and proper ways to transport and store oxygen to ensure safety.   PULMONARY REHAB OTHER RESPIRATORY from 09/18/2017 in [place]  Date  08/21/17  Educator  DC  Instruction Review Code  2- Demonstrated Understanding      Knowledge Questionnaire Score: Knowledge Questionnaire Score - 07/01/17 1539      Knowledge Questionnaire Score   Pre Score  15/18       Core Components/Risk Factors/Patient Goals at Admission: Personal Goals and Risk Factors at Admission - 07/01/17 1600      Core Components/Risk Factors/Patient Goals on Admission    Weight Management  Yes    Intervention  Weight Management/Obesity: Establish reasonable short term and long term weight goals.;Obesity: Provide education and appropriate resources to help participant work on and attain dietary goals.    Admit Weight  214 lb 1.6 oz (97.1 kg)    Goal Weight: Short Term  204 lb 1.6 oz (92.6 kg)    Goal Weight: Long Term  194 lb 1.6 oz (88 kg)    Expected Outcomes  Short Term: Continue to assess and modify interventions until short term weight is achieved;Long Term: Adherence to nutrition and physical activity/exercise program aimed toward attainment of established weight goal    Personal Goal Other  Yes    Personal Goal  Better mobility, lose 25-30lbs    Intervention  Attend PR 2 x week and supplement at home exercise 3 x week.    Expected Outcomes  Reach personal goals.       Core Components/Risk Factors/Patient Goals Review:  Goals and  Risk Factor Review    Row Name 07/31/17 1337 08/28/17 1521 09/18/17 1502         Core Components/Risk Factors/Patient Goals Review   Personal Goals Review  Weight Management/Obesity;Increase knowledge of respiratory medications and ability to use respiratory devices properly.;Improve shortness of breath with ADL's Better mobility; lose weight 25-30 lbs;  Do yard work.   Weight Management/Obesity;Increase knowledge of respiratory medications and ability to use respiratory devices properly.;Improve shortness of breath with ADL's Better mobility; lose weight 25-30 lbs. Do yard work again.   Weight Management/Obesity;Increase knowledge of respiratory medications and  ability to use respiratory devices properly.;Improve shortness of breath with ADL's Better mobility; lose weight 25-30 lbs; Do yard work again.      Review  Patient has completed 7 sessions losing 3.5 lbs. She is doing well in the program with some progression. She says it is too soon to tell if the program is making a difference. She hopes to meet her goals as she continues the program. Will continue to monitor for progress.   Patient has completed 15 sessions losing 4 lbs. She continues to do well in the program with progression. She says the program is helping her. She feels stronger and has more endurance to do things. She says she feels like she could do yard work but is taking a medication that does no allow to get out in the sun or sweat excessively. She is afraid to try. She is very pleased with her progress. Will continue to monitor for progress.   Patient has completed 21 sessions gaining 3 lbs since last 30 day review. She continues to do well in the program with progression. She continues to say the program is helping her. She says she has more endurance and more energy to do things. She says she feels like she has gained some strength since last 30 day review. Will continue to monitor for progress.      Expected Outcomes  Patient will  continue to attend sessions and complete the program.   Patient will continue to attend sessions and complete the program.   Patient will continue to attend sessions and complete the program.         Core Components/Risk Factors/Patient Goals at Discharge (Final Review):  Goals and Risk Factor Review - 09/18/17 1502      Core Components/Risk Factors/Patient Goals Review   Personal Goals Review  Weight Management/Obesity;Increase knowledge of respiratory medications and ability to use respiratory devices properly.;Improve shortness of breath with ADL's Better mobility; lose weight 25-30 lbs; Do yard work again.     Review  Patient has completed 21 sessions gaining 3 lbs since last 30 day review. She continues to do well in the program with progression. She continues to say the program is helping her. She says she has more endurance and more energy to do things. She says she feels like she has gained some strength since last 30 day review. Will continue to monitor for progress.     Expected Outcomes  Patient will continue to attend sessions and complete the program.        ITP Comments: ITP Comments    Row Name 07/01/17 1543 07/03/17 1336         ITP Comments  Mrs. 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 She has also been diagnosed with Myasthenia Gravis She is depressed according to her QOL scores. We will continue to address this area as she comes through our program.   Patient new to program. Plans to start Tuesday 07/08/17. Will continue to monitor for progress.          Comments: .ITP 30 Day REVIEW Pt is making expected progress toward pulmonary rehab goals after completing 21 sessions. Recommend continued exercise, life style modification, education, and utilization of breathing techniques to increase stamina and strength and decrease shortness of breath with exertion.

## 2017-09-18 NOTE — Progress Notes (Signed)
Daily Session Note  Patient Details  Name: [name] MRN: [id number] Date of Birth: 11-Mar-1946 Referring Provider:     PULMONARY REHAB OTHER RESP ORIENTATION from 07/01/2017 in [place]  Referring Provider  Dr. Chase Caller       Encounter Date: 09/18/2017  Check In: Session Check In - 09/18/17 1122      Check-In   Location  [facility]    Staff Present  Suzanne Boron, BS, EP, Exercise Physiologist;Debra Wynetta Emery, RN, BSN;Diane Coad, MS, EP, [facility], Exercise Physiologist    Supervising physician immediately available to respond to emergencies  See telemetry face sheet for immediately available MD    Medication changes reported      No    Fall or balance concerns reported     Yes    Comments  Has fallen twice in the past 12 months.     Warm-up and Cool-down  Performed as group-led Higher education careers adviser Performed  Yes    VAD Patient?  No      Pain Assessment   Currently in Pain?  No/denies    Pain Score  0-No pain    Multiple Pain Sites  No       Capillary Blood Glucose: No results found for this or any previous visit (from the past 24 hour(s)).    Social History   Tobacco Use  Smoking Status Never Smoker  Smokeless Tobacco Never Used    Goals Met:  Independence with exercise equipment Improved SOB with ADL's Using PLB without cueing & demonstrates good technique Exercise tolerated well No report of cardiac concerns or symptoms Strength training completed today  Goals Unmet:  Not Applicable  Comments: Check out 1145   Dr. Sinda Du is Medical Director for [facility] Pulmonary Rehab.

## 2017-09-23 ENCOUNTER — Encounter (HOSPITAL_COMMUNITY)
Admission: RE | Admit: 2017-09-23 | Discharge: 2017-09-23 | Disposition: A | Discharge status: Home / Self Care | Payer: PPO | Source: Ambulatory Visit | Attending: Internal Medicine | Admitting: Internal Medicine

## 2017-09-23 DIAGNOSIS — J849 Interstitial pulmonary disease, unspecified: Secondary | ICD-10-CM

## 2017-09-23 NOTE — Progress Notes (Signed)
Daily Session Note  Patient Details  Name: [name] MRN: [id number] Date of Birth: 10-15-1945 Referring Provider:     PULMONARY REHAB OTHER RESP ORIENTATION from 07/01/2017 in [place]  Referring Provider  Dr. Chase Caller       Encounter Date: 09/23/2017  Check In: Session Check In - 09/23/17 1100      Check-In   Location  [facility]    Staff Present  Suzanne Boron, BS, EP, Exercise Physiologist;Diane Coad, MS, EP, [facility], Exercise Physiologist    Supervising physician immediately available to respond to emergencies  See telemetry face sheet for immediately available MD    Medication changes reported      No    Fall or balance concerns reported     Yes    Comments  Has fallen twice in the past 12 months.     Warm-up and Cool-down  Performed as group-led Higher education careers adviser Performed  Yes    VAD Patient?  No      Pain Assessment   Currently in Pain?  No/denies    Pain Score  0-No pain    Multiple Pain Sites  No       Capillary Blood Glucose: No results found for this or any previous visit (from the past 24 hour(s)).    Social History   Tobacco Use  Smoking Status Never Smoker  Smokeless Tobacco Never Used    Goals Met:  Independence with exercise equipment Improved SOB with ADL's Using PLB without cueing & demonstrates good technique Exercise tolerated well No report of cardiac concerns or symptoms Strength training completed today  Goals Unmet:  Not Applicable  Comments: Check out 1145   Dr. Sinda Du is Medical Director for [facility] Pulmonary Rehab.

## 2017-09-25 ENCOUNTER — Encounter (HOSPITAL_COMMUNITY)
Admission: RE | Admit: 2017-09-25 | Discharge: 2017-09-25 | Disposition: A | Discharge status: Home / Self Care | Payer: PPO | Source: Ambulatory Visit | Attending: Internal Medicine | Admitting: Internal Medicine

## 2017-09-25 DIAGNOSIS — J849 Interstitial pulmonary disease, unspecified: Secondary | ICD-10-CM | POA: Diagnosis not present

## 2017-09-25 NOTE — Progress Notes (Signed)
Daily Session Note  Patient Details  Name: [name] MRN: [id number] Date of Birth: [birth year] Referring Provider:     PULMONARY REHAB OTHER RESP ORIENTATION from 07/01/2017 in [place]  Referring Provider  Dr. Chase Caller       Encounter Date: 09/25/2017  Check In: Session Check In - 09/25/17 1045      Check-In   Location  [facility]    Staff Present  Suzanne Boron, BS, EP, Exercise Physiologist;Diane Coad, MS, EP, [facility], Exercise Physiologist;Lyric Hoar Wynetta Emery, RN, BSN    Supervising physician immediately available to respond to emergencies  See telemetry face sheet for immediately available MD    Medication changes reported      No    Fall or balance concerns reported     Yes    Comments  Has fallen twice in the past 12 months. Patient reports falling yesterday 09/24/17 at her mother-in-law's house tripping over a broken piece of floor tile. She reports hitting her left elbow on a table as she fell. Her family assisted her up. She did not see MD or ED. She says her elbow is sore today when she bends it. She did not do the Armogometer today.     Warm-up and Cool-down  Performed as group-led Higher education careers adviser Performed  Yes    VAD Patient?  No      Pain Assessment   Currently in Pain?  No/denies    Pain Score  0-No pain    Multiple Pain Sites  No       Capillary Blood Glucose: No results found for this or any previous visit (from the past 24 hour(s)).    Social History   Tobacco Use  Smoking Status Never Smoker  Smokeless Tobacco Never Used    Goals Met:  Proper associated with RPD/PD & O2 Sat Independence with exercise equipment Improved SOB with ADL's Using PLB without cueing & demonstrates good technique Exercise tolerated well Personal goals reviewed No report of cardiac concerns or symptoms Strength training completed today  Goals Unmet:  Not Applicable  Comments: Check out 1145.   Dr. Sinda Du is Medical Director for [facility] Pulmonary Rehab.

## 2017-09-30 ENCOUNTER — Encounter (HOSPITAL_COMMUNITY)
Admission: RE | Admit: 2017-09-30 | Discharge: 2017-09-30 | Disposition: A | Discharge status: Home / Self Care | Payer: PPO | Source: Ambulatory Visit | Attending: Internal Medicine | Admitting: Internal Medicine

## 2017-09-30 DIAGNOSIS — Z79899 Other long term (current) drug therapy: Secondary | ICD-10-CM | POA: Diagnosis not present

## 2017-09-30 DIAGNOSIS — E119 Type 2 diabetes mellitus without complications: Secondary | ICD-10-CM | POA: Insufficient documentation

## 2017-09-30 DIAGNOSIS — K219 Gastro-esophageal reflux disease without esophagitis: Secondary | ICD-10-CM | POA: Diagnosis not present

## 2017-09-30 DIAGNOSIS — E785 Hyperlipidemia, unspecified: Secondary | ICD-10-CM | POA: Diagnosis not present

## 2017-09-30 DIAGNOSIS — M797 Fibromyalgia: Secondary | ICD-10-CM | POA: Insufficient documentation

## 2017-09-30 DIAGNOSIS — G4730 Sleep apnea, unspecified: Secondary | ICD-10-CM | POA: Insufficient documentation

## 2017-09-30 DIAGNOSIS — Z7984 Long term (current) use of oral hypoglycemic drugs: Secondary | ICD-10-CM | POA: Insufficient documentation

## 2017-09-30 DIAGNOSIS — I10 Essential (primary) hypertension: Secondary | ICD-10-CM | POA: Insufficient documentation

## 2017-09-30 DIAGNOSIS — Z7982 Long term (current) use of aspirin: Secondary | ICD-10-CM | POA: Insufficient documentation

## 2017-09-30 DIAGNOSIS — J849 Interstitial pulmonary disease, unspecified: Secondary | ICD-10-CM | POA: Insufficient documentation

## 2017-09-30 NOTE — Progress Notes (Signed)
Daily Session Note  Patient Details  Name: [name] MRN: [id number] Date of Birth: 28-Aug-1945 Referring Provider:     PULMONARY REHAB OTHER RESP ORIENTATION from 07/01/2017 in [place]  Referring Provider  Dr. Chase Caller       Encounter Date: 09/30/2017  Check In:  Session Check In - 09/30/17 1403      Check-In   Location  [facility]    Staff Present  Suzanne Boron, BS, EP, Exercise Physiologist;Diane Coad, MS, EP, [facility], Exercise Physiologist    Supervising physician immediately available to respond to emergencies  See telemetry face sheet for immediately available MD    Medication changes reported      No    Fall or balance concerns reported     Yes    Comments  Has fallen twice in the past 12 months. Patient reports falling yesterday 09/24/17 at her mother-in-law's house tripping over a broken piece of floor tile. She reports hitting her left elbow on a table as she fell. Her family assisted her up. She did not see MD or ED. She says her elbow is sore today when she bends it. She did not do the Armogometer today.     Warm-up and Cool-down  Performed as group-led Higher education careers adviser Performed  Yes    VAD Patient?  No      Pain Assessment   Currently in Pain?  No/denies    Pain Score  0-No pain    Multiple Pain Sites  No       Capillary Blood Glucose: No results found for this or any previous visit (from the past 24 hour(s)).    Social History   Tobacco Use  Smoking Status Never Smoker  Smokeless Tobacco Never Used    Goals Met:  Independence with exercise equipment Improved SOB with ADL's Using PLB without cueing & demonstrates good technique Exercise tolerated well Personal goals reviewed No report of cardiac concerns or symptoms Strength training completed today  Goals Unmet:  Not Applicable  Comments: Check out 1145   Dr. Sinda Du is Medical Director for [facility] Pulmonary Rehab.

## 2017-10-02 ENCOUNTER — Encounter (HOSPITAL_COMMUNITY)
Admission: RE | Admit: 2017-10-02 | Discharge: 2017-10-02 | Disposition: A | Discharge status: Home / Self Care | Payer: PPO | Source: Ambulatory Visit | Attending: Internal Medicine | Admitting: Internal Medicine

## 2017-10-02 DIAGNOSIS — J849 Interstitial pulmonary disease, unspecified: Secondary | ICD-10-CM | POA: Diagnosis not present

## 2017-10-02 NOTE — Progress Notes (Signed)
Daily Session Note  Patient Details  Name: [name] MRN: [id number] Date of Birth: Apr 20, 1946 Referring Provider:     PULMONARY REHAB OTHER RESP ORIENTATION from 07/01/2017 in [place]  Referring Provider  Dr. Chase Caller       Encounter Date: 10/02/2017  Check In: Session Check In - 10/02/17 1045      Check-In   Location  [facility]    Staff Present  Diane Angelina Pih, MS, EP, [facility], Exercise Physiologist;Mehran Guderian Wynetta Emery, RN, BSN    Supervising physician immediately available to respond to emergencies  See telemetry face sheet for immediately available MD    Medication changes reported      No    Fall or balance concerns reported     Yes    Comments  Has fallen twice in the past 12 months. She also fell 09/24/17.     Warm-up and Cool-down  Performed as group-led Higher education careers adviser Performed  Yes    VAD Patient?  No      Pain Assessment   Currently in Pain?  No/denies    Pain Score  0-No pain    Multiple Pain Sites  No       Capillary Blood Glucose: No results found for this or any previous visit (from the past 24 hour(s)).    Social History   Tobacco Use  Smoking Status Never Smoker  Smokeless Tobacco Never Used    Goals Met:  Proper associated with RPD/PD & O2 Sat Independence with exercise equipment Improved SOB with ADL's Using PLB without cueing & demonstrates good technique Exercise tolerated well No report of cardiac concerns or symptoms Strength training completed today  Goals Unmet:  Not Applicable  Comments: Check out 1145.   Dr. Sinda Du is Medical Director for [facility].

## 2017-10-07 ENCOUNTER — Encounter (HOSPITAL_COMMUNITY)
Admission: RE | Admit: 2017-10-07 | Discharge: 2017-10-07 | Disposition: A | Discharge status: Home / Self Care | Payer: PPO | Source: Ambulatory Visit | Attending: Internal Medicine | Admitting: Internal Medicine

## 2017-10-07 DIAGNOSIS — J849 Interstitial pulmonary disease, unspecified: Secondary | ICD-10-CM | POA: Diagnosis not present

## 2017-10-07 NOTE — Progress Notes (Signed)
Daily Session Note  Patient Details  Name: [name] MRN: [id number] Date of Birth: 30-Apr-1945 Referring Provider:     PULMONARY REHAB OTHER RESP ORIENTATION from 07/01/2017 in [place]  Referring Provider  Dr. Chase Caller       Encounter Date: 10/07/2017  Check In: Session Check In - 10/07/17 1045      Check-In   Location  [facility]    Staff Present  Quintyn Dombek Angelina Pih, MS, EP, [facility], Exercise Physiologist;Gregory Luther Parody, BS, EP, Exercise Physiologist    Supervising physician immediately available to respond to emergencies  See telemetry face sheet for immediately available MD    Medication changes reported      No    Fall or balance concerns reported     Yes    Warm-up and Cool-down  Performed as group-led instruction    Resistance Training Performed  Yes    VAD Patient?  No      Pain Assessment   Currently in Pain?  No/denies    Multiple Pain Sites  No       Capillary Blood Glucose: No results found for this or any previous visit (from the past 24 hour(s)).    Social History   Tobacco Use  Smoking Status Never Smoker  Smokeless Tobacco Never Used    Goals Met:  Proper associated with RPD/PD & O2 Sat Independence with exercise equipment Exercise tolerated well Personal goals reviewed No report of cardiac concerns or symptoms Strength training completed today  Goals Unmet:  Not Applicable  Comments: Check out: [place]    Dr. Sinda Du is Medical Director for [facility].

## 2017-10-09 ENCOUNTER — Encounter (HOSPITAL_COMMUNITY)
Admission: RE | Admit: 2017-10-09 | Discharge: 2017-10-09 | Disposition: A | Discharge status: Home / Self Care | Payer: PPO | Source: Ambulatory Visit | Attending: Internal Medicine | Admitting: Internal Medicine

## 2017-10-09 DIAGNOSIS — J849 Interstitial pulmonary disease, unspecified: Secondary | ICD-10-CM | POA: Diagnosis not present

## 2017-10-09 NOTE — Progress Notes (Signed)
Daily Session Note  Patient Details  Name: [name] MRN: [id number] Date of Birth: 1945/11/07 Referring Provider:     PULMONARY REHAB OTHER RESP ORIENTATION from 07/01/2017 in [place]  Referring Provider  Dr. Chase Caller       Encounter Date: 10/09/2017  Check In: Session Check In - 10/09/17 1045      Check-In   Location  [facility]    Staff Present  Diane Angelina Pih, MS, EP, [facility], Exercise Physiologist;Gregory Luther Parody, BS, EP, Exercise Physiologist;Sanjith Siwek Wynetta Emery, RN, BSN    Supervising physician immediately available to respond to emergencies  See telemetry face sheet for immediately available MD    Medication changes reported      No    Fall or balance concerns reported     Yes    Comments  Has fallen twice in the past 12 months. She also fell 09/24/17.     Warm-up and Cool-down  Performed as group-led Higher education careers adviser Performed  Yes    VAD Patient?  No      Pain Assessment   Currently in Pain?  No/denies    Pain Score  0-No pain    Multiple Pain Sites  No       Capillary Blood Glucose: No results found for this or any previous visit (from the past 24 hour(s)).    Social History   Tobacco Use  Smoking Status Never Smoker  Smokeless Tobacco Never Used    Goals Met:  Proper associated with RPD/PD & O2 Sat Independence with exercise equipment Improved SOB with ADL's Using PLB without cueing & demonstrates good technique Exercise tolerated well No report of cardiac concerns or symptoms Strength training completed today  Goals Unmet:  Not Applicable  Comments: Check out 1145.   Dr. Sinda Du is Medical Director for [facility].

## 2017-10-10 NOTE — Progress Notes (Signed)
Pulmonary Individual Treatment Plan  Patient Details  Name: JACQUITA MULHEARN MRN: [id number] Date of Birth: May 07, 1945 Referring Provider:     PULMONARY REHAB OTHER RESP ORIENTATION from 07/01/2017 in [place]  Referring Provider  Dr. Chase Caller       Initial Encounter Date:    [place] from 07/01/2017 in [place]  Date  07/01/17  Referring Provider  Dr. Chase Caller       Visit Diagnosis: ILD (interstitial lung disease) ([place])  Patient's Home Medications on Admission:   Current Outpatient Medications:  .  ACCU-CHEK AVIVA PLUS test strip, Apply 1 strip topically daily. for testing, Disp: , Rfl: 1 .  ACCU-CHEK SOFTCLIX LANCETS lancets, TEST DAILY E11.9, Disp: , Rfl: 1 .  amoxicillin (AMOXIL) 875 MG tablet, Take 875 mg by mouth 2 (two) times daily., Disp: , Rfl:  .  APPLE CIDER VINEGAR PO, Take by mouth daily. , Disp: , Rfl:  .  aspirin EC 81 MG tablet, Take 81 mg by mouth daily., Disp: , Rfl:  .  Calcium Carb-Cholecalciferol (CALCIUM+D3) 600-800 MG-UNIT TABS, Take 1 tablet by mouth daily., Disp: , Rfl:  .  cetirizine (ZYRTEC) 10 MG tablet, Take 10 mg by mouth daily., Disp: , Rfl: 0 .  co-enzyme Q-10 50 MG capsule, Take 100 mg by mouth daily., Disp: , Rfl:  .  [employer] NATURAL LUTEIN EYE HEALTH PO, Take 1 capsule by mouth daily. , Disp: , Rfl:  .  diclofenac sodium (VOLTAREN) 1 % GEL, Apply 1 application topically 2 (two) times daily., Disp: , Rfl:  .  fluticasone (FLONASE) 50 MCG/ACT nasal spray, Place 2 sprays into both nostrils daily. , Disp: , Rfl:  .  gabapentin (NEURONTIN) 100 MG capsule, Take 100 mg by mouth 2 (two) times daily., Disp: , Rfl:  .  gabapentin (NEURONTIN) 300 MG capsule, Take by mouth daily. , Disp: , Rfl:  .  Ginger, Zingiber officinalis, (GINGER ROOT) 550 MG CAPS, Take 1 capsule by mouth daily., Disp: , Rfl:  .  glipiZIDE (GLUCOTROL XL) 10 MG 24 hr tablet, TAKE 1 TABLET BY MOUTH AFTER LUNCH AND  SUPPER, Disp: , Rfl:  .  levocarnitine (CARNITOR) 250 MG capsule, Take 500 mg by mouth daily., Disp: , Rfl:  .  losartan-hydrochlorothiazide (HYZAAR) 100-25 MG tablet, TAKE (1) TABLET BY MOUTH ONCE DAILY., Disp: , Rfl:  .  metFORMIN (GLUCOPHAGE-XR) 500 MG 24 hr tablet, TAKE 2 TABLETS TWICE DAILY AS DIRECTED., Disp: , Rfl:  .  Misc Natural Products (TART CHERRY ADVANCED) CAPS, Take by mouth., Disp: , Rfl:  .  Misc. Devices MISC, Avivia Plus lancets and strips. Tests daily  DX E11.9, Disp: , Rfl:  .  naproxen sodium (ANAPROX) 550 MG tablet, , Disp: , Rfl:  .  Nutritional Supplements (EQ ESTROBLEND MENOPAUSE) TABS, Take 1 tablet by mouth daily., Disp: , Rfl:  .  omega-3 acid ethyl esters (LOVAZA) 1 g capsule, Take 1 g by mouth daily., Disp: , Rfl:  .  pregabalin (LYRICA) 50 MG capsule, Take 50 mg by mouth 2 (two) times daily. , Disp: , Rfl:  .  pyridostigmine (MESTINON) 60 MG tablet, Takes 1/2 tablet four times per day, Disp: 120 tablet, Rfl: 6 .  rosuvastatin (CRESTOR) 5 MG tablet, Take 5 mg by mouth daily at 6 PM., Disp: , Rfl:  .  sitaGLIPtin (JANUVIA) 100 MG tablet, TAKE (1) TABLET BY MOUTH ONCE DAILY., Disp: , Rfl:  .  Turmeric Curcumin 500 MG  CAPS, Take 1 capsule by mouth daily., Disp: , Rfl:   Past Medical History: Past Medical History:  Diagnosis Date  . Arthritis   . Bursitis   . Diabetes mellitus without complication ([place])   . Fibromyalgia   . GERD (gastroesophageal reflux disease)   . Hyperlipemia   . Hypertension   . Sinusitis   . Sleep apnea    uses a cpap  . Sleep apnea   . Wears glasses     Tobacco Use: Social History   Tobacco Use  Smoking Status Never Smoker  Smokeless Tobacco Never Used    Labs: Recent Review Scientist, physiological    Labs for ITP Cardiac and Pulmonary Rehab Latest Ref Rng & Units 01/07/2011   TCO2 0 - 100 mmol/L 25      Capillary Blood Glucose: Lab Results  Component Value Date   GLUCAP 140 (H) 09/09/2012     Pulmonary Assessment  Scores: Pulmonary Assessment Scores    Row Name 07/01/17 1540         ADL UCSD   ADL Phase  Entry     SOB Score total  33     Rest  1     Walk  10     Stairs  4     Bath  0     Dress  0     Shop  0       CAT Score   CAT Score  11       mMRC Score   mMRC Score  3        Pulmonary Function Assessment: Pulmonary Function Assessment - 07/01/17 1539      Pulmonary Function Tests   FVC%  93 %    FEV1%  110 %    FEV1/FVC Ratio  117    RV%  35 %    DLCO%  71 %      Breath   Bilateral Breath Sounds  Clear    Shortness of Breath  Yes       Exercise Target Goals:    Exercise Program Goal: Individual exercise prescription set using results from initial 6 min walk test and THRR while considering  patient's activity barriers and safety.   Exercise Prescription Goal: Initial exercise prescription builds to 30-45 minutes a day of aerobic activity, 2-3 days per week.  Home exercise guidelines will be given to patient during program as part of exercise prescription that the participant will acknowledge.  Activity Barriers & Risk Stratification: Activity Barriers & Cardiac Risk Stratification - 07/01/17 1308      Activity Barriers & Cardiac Risk Stratification   Activity Barriers  Neck/Spine Problems;History of Falls;Deconditioning;Balance Concerns;Assistive Device;Shortness of Breath    Cardiac Risk Stratification  High       6 Minute Walk: 6 Minute Walk    Row Name 07/01/17 1411         6 Minute Walk   Phase  Initial     Distance  950 feet     Distance % Change  0 %     Distance Feet Change  0 ft     Walk Time  6 minutes     # of Rest Breaks  0     MPH  1.79     METS  2.37     RPE  11     Perceived Dyspnea   13     VO2 Peak  8.17     Symptoms  No  Resting HR  82 bpm     Resting BP  132/64     Resting Oxygen Saturation   97 %     Exercise Oxygen Saturation  during 6 min walk  97 %     Max Ex. HR  112 bpm     Max Ex. BP  180/76     2 Minute Post BP   136/64        Oxygen Initial Assessment: Oxygen Initial Assessment - 07/01/17 1556      Home Oxygen   Home Oxygen Device  None    Sleep Oxygen Prescription  None    Home Exercise Oxygen Prescription  None    Home at Rest Exercise Oxygen Prescription  None      Initial 6 min Walk   Oxygen Used  None      Program Oxygen Prescription   Program Oxygen Prescription  None       Oxygen Re-Evaluation: Oxygen Re-Evaluation    Row Name 07/31/17 1335 08/28/17 1518 09/18/17 1501 10/10/17 1348       Program Oxygen Prescription   Program Oxygen Prescription  None  None  None  None      Home Oxygen   Home Oxygen Device  None  None  None  None    Sleep Oxygen Prescription  None  None  None  None    Home Exercise Oxygen Prescription  None  None  None  None    Home at Rest Exercise Oxygen Prescription  None  None  None  None      Goals/Expected Outcomes   Short Term Goals  To learn and understand importance of monitoring SPO2 with pulse oximeter and demonstrate accurate use of the pulse oximeter.;To learn and understand importance of maintaining oxygen saturations>88%;To learn and demonstrate proper pursed lip breathing techniques or other breathing techniques.  To learn and understand importance of monitoring SPO2 with pulse oximeter and demonstrate accurate use of the pulse oximeter.;To learn and understand importance of maintaining oxygen saturations>88%;To learn and demonstrate proper pursed lip breathing techniques or other breathing techniques.  To learn and understand importance of monitoring SPO2 with pulse oximeter and demonstrate accurate use of the pulse oximeter.;To learn and understand importance of maintaining oxygen saturations>88%;To learn and demonstrate proper pursed lip breathing techniques or other breathing techniques.  To learn and understand importance of monitoring SPO2 with pulse oximeter and demonstrate accurate use of the pulse oximeter.;To learn and understand  importance of maintaining oxygen saturations>88%;To learn and demonstrate proper pursed lip breathing techniques or other breathing techniques.    Long  Term Goals  Verbalizes importance of monitoring SPO2 with pulse oximeter and return demonstration;Maintenance of O2 saturations>88%;Exhibits proper breathing techniques, such as pursed lip breathing or other method taught during program session  Verbalizes importance of monitoring SPO2 with pulse oximeter and return demonstration;Maintenance of O2 saturations>88%;Exhibits proper breathing techniques, such as pursed lip breathing or other method taught during program session  Verbalizes importance of monitoring SPO2 with pulse oximeter and return demonstration;Maintenance of O2 saturations>88%;Exhibits proper breathing techniques, such as pursed lip breathing or other method taught during program session  Verbalizes importance of monitoring SPO2 with pulse oximeter and return demonstration;Maintenance of O2 saturations>88%;Exhibits proper breathing techniques, such as pursed lip breathing or other method taught during program session    Comments  Patient demonstrates proper use of pulse oximeter with return demonstration and she verbalizes the importance of maintaining O2 saturations >88%. She also demonstrates proper pursed lip breathing  during the program.   Patient demonstrates proper use of pulse oximeter with return demonstration and she verbalizes the importance of maintaining O2 saturations >88%. She also demonstrates proper pursed lip breathing during the program.   Patient demonstrates proper use of pulse oximeter with return demonstration and she verbalizes the importance of maintaining O2 saturations >88%. She also demonstrates proper pursed lip breathing during the program.   Patient demonstrates proper use of pulse oximeter with return demonstration and she verbalizes the importance of maintaining O2 saturations >88%. She also demonstrates proper  pursed lip breathing during the program.     Goals/Expected Outcomes  Patient will continues to meet her short and long term goals.   Patient will continues to meet her short and long term goals.   Patient will continues to meet her short and long term goals.   Patient will continues to meet her short and long term goals.        Oxygen Discharge (Final Oxygen Re-Evaluation): Oxygen Re-Evaluation - 10/10/17 1348      Program Oxygen Prescription   Program Oxygen Prescription  None      Home Oxygen   Home Oxygen Device  None    Sleep Oxygen Prescription  None    Home Exercise Oxygen Prescription  None    Home at Rest Exercise Oxygen Prescription  None      Goals/Expected Outcomes   Short Term Goals  To learn and understand importance of monitoring SPO2 with pulse oximeter and demonstrate accurate use of the pulse oximeter.;To learn and understand importance of maintaining oxygen saturations>88%;To learn and demonstrate proper pursed lip breathing techniques or other breathing techniques.    Long  Term Goals  Verbalizes importance of monitoring SPO2 with pulse oximeter and return demonstration;Maintenance of O2 saturations>88%;Exhibits proper breathing techniques, such as pursed lip breathing or other method taught during program session    Comments  Patient demonstrates proper use of pulse oximeter with return demonstration and she verbalizes the importance of maintaining O2 saturations >88%. She also demonstrates proper pursed lip breathing during the program.     Goals/Expected Outcomes  Patient will continues to meet her short and long term goals.        Initial Exercise Prescription: Initial Exercise Prescription - 07/01/17 1300      Date of Initial Exercise RX and Referring Provider   Date  07/01/17    Referring Provider  Dr. Chase Caller       NuStep   Level  2    SPM  66    Minutes  15    METs  1.4      Arm Ergometer   Level  1.2    Watts  12    RPM  12    Minutes  20     METs  1.9      Prescription Details   Frequency (times per week)  3    Duration  Progress to 30 minutes of continuous aerobic without signs/symptoms of physical distress      Intensity   THRR 40-80% of Max Heartrate  [phone]    Ratings of Perceived Exertion  11-13    Perceived Dyspnea  0-4      Progression   Progression  Continue progressive overload as per policy without signs/symptoms or physical distress.      Resistance Training   Training Prescription  Yes    Weight  1    Reps  10-15       Perform Capillary Blood  Glucose checks as needed.  Exercise Prescription Changes:  Exercise Prescription Changes    Row Name 07/17/17 0700 07/25/17 1400 08/20/17 1500 09/02/17 1000 09/15/17 1400     Response to Exercise   Blood Pressure (Admit)  120/62  116/50  118/58  112/64  128/74   Blood Pressure (Exercise)  180/80  180/64  170/62  140/72  160/58   Blood Pressure (Exit)  148/82  100/50  130/70  148/70  112/54   Heart Rate (Admit)  78 bpm  80 bpm  80 bpm  63 bpm  78 bpm   Heart Rate (Exercise)  94 bpm  95 bpm  96 bpm  94 bpm  87 bpm   Heart Rate (Exit)  69 bpm  77 bpm  74 bpm  72 bpm  87 bpm   Oxygen Saturation (Admit)  98 %  98 %  96 %  97 %  96 %   Oxygen Saturation (Exercise)  93 %  97 %  97 %  97 %  98 %   Oxygen Saturation (Exit)  96 %  96 %  97 %  96 %  97 %   Rating of Perceived Exertion (Exercise)  _0 Perceived Dyspnea (Exercise)  _1 Duration  Progress to 30 minutes of  aerobic without signs/symptoms of physical distress  Progress to 30 minutes of  aerobic without signs/symptoms of physical distress  Progress to 30 minutes of  aerobic without signs/symptoms of physical distress  Progress to 30 minutes of  aerobic without signs/symptoms of physical distress  Progress to 30 minutes of  aerobic without signs/symptoms of physical distress   Intensity  THRR New [phone]  THRR New 108-122-136  THRR unchanged 108-122-136  THRR unchanged  98-115-133  THRR unchanged 107-121-136     Progression   Progression  Continue to progress workloads to maintain intensity without signs/symptoms of physical distress.  Continue to progress workloads to maintain intensity without signs/symptoms of physical distress.  Continue to progress workloads to maintain intensity without signs/symptoms of physical distress.  Continue to progress workloads to maintain intensity without signs/symptoms of physical distress.  Continue to progress workloads to maintain intensity without signs/symptoms of physical distress.     Resistance Training   Training Prescription  Yes  Yes  Yes  Yes  Yes   Weight  _2 Reps  10-15  10-15  10-15  10-15  10-15     NuStep   Level  _3 SPM  81  97  120  117  134   Minutes  _4 METs  1.7  1.7  1.8  1.8  2     Arm Ergometer   Level  1  1.3  1.4  1.5  -   Watts  _5 -   RPM  _6 -   Minutes  _7 -   METs  1  1.3  2.3  2.5  -     Home Exercise Plan   Plans to continue exercise at  Home (comment)  Home (comment)  Home (comment)  Home (comment)  Home (comment)  Frequency  Add 2 additional days to program exercise sessions.  Add 2 additional days to program exercise sessions.  Add 2 additional days to program exercise sessions.  Add 2 additional days to program exercise sessions.  Add 2 additional days to program exercise sessions.   Initial Home Exercises Provided  07/15/17  07/15/17  07/15/17  07/15/17  07/15/17   Row Name 09/29/17 1400             Response to Exercise   Blood Pressure (Admit)  130/72       Blood Pressure (Exercise)  134/50       Blood Pressure (Exit)  100/50       Heart Rate (Admit)  80 bpm       Heart Rate (Exercise)  84 bpm       Heart Rate (Exit)  66 bpm       Oxygen Saturation (Admit)  97 %       Oxygen Saturation (Exercise)  98 %       Oxygen Saturation (Exit)  99 %       Rating of Perceived Exertion (Exercise)   11       Perceived Dyspnea (Exercise)  11       Duration  Progress to 30 minutes of  aerobic without signs/symptoms of physical distress       Intensity  THRR New [id number]         Progression   Progression  Continue to progress workloads to maintain intensity without signs/symptoms of physical distress.         Resistance Training   Training Prescription  Yes       Weight  1       Reps  10-15         NuStep   Level  3       SPM  116       Minutes  30       METs  1.8         Home Exercise Plan   Plans to continue exercise at  Home (comment)       Frequency  Add 2 additional days to program exercise sessions.       Initial Home Exercises Provided  07/15/17          Exercise Comments:  Exercise Comments    Row Name 07/17/17 0751 07/25/17 1429 08/20/17 1504 09/02/17 1013 09/15/17 1453   Exercise Comments  Patient has just started PR and will slowly be progressed in time.   Patient is doing very well in PR even on only her 5th session she has progressed on the arm ergometer to level 1.3. Patient has not been in the program long enough to see progress however she will continued to be monitored.   Patient is doing very well in PR she has increased her SPMs on the nustep and her level on the arm ergometer and she has gottn stronger since the program began. Patient enjoys the time with friends that she has made.   Patient continues to do well in PR. Patient has increased her level on the arm ergometer and has been keeping her SPMs high on the nustep machine. Patient continues to exercise with little to no SOB.   Patient continues to do well in PR. Patient has progressed in [facility] on the nustep and is doing well on the arm ergometer when she can use it due to our broken machine and busy class. Patient has  stated to me that she has increased strength and stamina since beginnin the program and that she truly enjoys it   [place] Name 09/29/17 1426           Exercise Comments  Patient is doing  well in PR. Patient has maintained her weights for her dumbbells at the beginning of the workout and has increased her level on the nustep machine. Patient states that she feels stronger since joining the program.           Exercise Goals and Review:  Exercise Goals    Row Name 07/01/17 1313             Exercise Goals   Increase Physical Activity  Yes       Intervention  Provide advice, education, support and counseling about physical activity/exercise needs.;Develop an individualized exercise prescription for aerobic and resistive training based on initial evaluation findings, risk stratification, comorbidities and participant's personal goals.       Expected Outcomes  Short Term: Attend rehab on a regular basis to increase amount of physical activity.       Increase Strength and Stamina  Yes       Intervention  Develop an individualized exercise prescription for aerobic and resistive training based on initial evaluation findings, risk stratification, comorbidities and participant's personal goals.;Provide advice, education, support and counseling about physical activity/exercise needs.       Expected Outcomes  Short Term: Increase workloads from initial exercise prescription for resistance, speed, and METs.       Able to understand and use rate of perceived exertion (RPE) scale  Yes       Intervention  Provide education and explanation on how to use RPE scale       Expected Outcomes  Short Term: Able to use RPE daily in rehab to express subjective intensity level;Long Term:  Able to use RPE to guide intensity level when exercising independently       Able to understand and use Dyspnea scale  Yes       Intervention  Provide education and explanation on how to use Dyspnea scale       Expected Outcomes  Short Term: Able to use Dyspnea scale daily in rehab to express subjective sense of shortness of breath during exertion;Long Term: Able to use Dyspnea scale to guide intensity level when  exercising independently       Knowledge and understanding of Target Heart Rate Range (THRR)  Yes       Intervention  Provide education and explanation of THRR including how the numbers were predicted and where they are located for reference       Expected Outcomes  Short Term: Able to state/look up THRR;Long Term: Able to use THRR to govern intensity when exercising independently;Short Term: Able to use daily as guideline for intensity in rehab       Able to check pulse independently  Yes       Intervention  Provide education and demonstration on how to check pulse in carotid and radial arteries.;Review the importance of being able to check your own pulse for safety during independent exercise       Expected Outcomes  Short Term: Able to explain why pulse checking is important during independent exercise;Long Term: Able to check pulse independently and accurately       Understanding of Exercise Prescription  Yes       Intervention  Provide education, explanation, and written materials on patient's individual exercise  prescription       Expected Outcomes  Short Term: Able to explain program exercise prescription;Long Term: Able to explain home exercise prescription to exercise independently          Exercise Goals Re-Evaluation : Exercise Goals Re-Evaluation    Row Name 07/25/17 1428 08/25/17 1454 09/15/17 1452 10/07/17 0814       Exercise Goal Re-Evaluation   Exercise Goals Review  Increase Physical Activity;Increase Strength and Stamina;Able to understand and use Dyspnea scale;Knowledge and understanding of Target Heart Rate Range (THRR);Able to understand and use rate of perceived exertion (RPE) scale;Able to check pulse independently  Increase Physical Activity;Increase Strength and Stamina;Able to understand and use Dyspnea scale;Knowledge and understanding of Target Heart Rate Range (THRR);Able to understand and use rate of perceived exertion (RPE) scale;Able to check pulse independently   Increase Physical Activity;Increase Strength and Stamina;Able to understand and use Dyspnea scale;Knowledge and understanding of Target Heart Rate Range (THRR);Able to understand and use rate of perceived exertion (RPE) scale;Able to check pulse independently  Increase Physical Activity;Increase Strength and Stamina;Able to understand and use Dyspnea scale;Knowledge and understanding of Target Heart Rate Range (THRR);Able to understand and use rate of perceived exertion (RPE) scale;Able to check pulse independently    Comments  Patient is doing very well in PR even on only her 5th session she has progressed on the arm ergometer to level 1.3. Patient has not been in the program long enough to see progress however she will continued to be monitored.   Patient is doing well in PR and has maintained her levels on the two machines the arm ergometer and the nustep machine. Patient seems to be enjoying the program and looks to have gained some mobility from the program. Patient will continued to be monitored throughout the remainder of the program.   Patient continues to do well in PR. Patient has progressed in [facility] on the nustep and is doing well on the arm ergometer when she can use it due to our broken machine and busy class. Patient has stated to me that she has increased strength and stamina since beginnin the program and that she truly enjoys it  Patient continues to do better in CR. patiet has increased her level on the nustep to level 3 since last ITP. Patient has stated to me that she has better mobility and she can do much more around the house and at her local church without as much SOB.    Expected Outcomes  Patient wishes to have better mobility and to lose weight.   Patient wishes to have better mobility and to lose weight.   Patient wishes to have better mobility and to lose weight.   Patient wishes to have better mobility and to lose weight.        Discharge Exercise Prescription (Final Exercise  Prescription Changes): Exercise Prescription Changes - 09/29/17 1400      Response to Exercise   Blood Pressure (Admit)  130/72    Blood Pressure (Exercise)  134/50    Blood Pressure (Exit)  100/50    Heart Rate (Admit)  80 bpm    Heart Rate (Exercise)  84 bpm    Heart Rate (Exit)  66 bpm    Oxygen Saturation (Admit)  97 %    Oxygen Saturation (Exercise)  98 %    Oxygen Saturation (Exit)  99 %    Rating of Perceived Exertion (Exercise)  11    Perceived Dyspnea (Exercise)  11  Duration  Progress to 30 minutes of  aerobic without signs/symptoms of physical distress    Intensity  THRR New [id number]      Progression   Progression  Continue to progress workloads to maintain intensity without signs/symptoms of physical distress.      Resistance Training   Training Prescription  Yes    Weight  1    Reps  10-15      NuStep   Level  3    SPM  116    Minutes  30    METs  1.8      Home Exercise Plan   Plans to continue exercise at  Home (comment)    Frequency  Add 2 additional days to program exercise sessions.    Initial Home Exercises Provided  07/15/17       Nutrition:  Target Goals: Understanding of nutrition guidelines, daily intake of sodium <1546m, cholesterol <201m calories 30% from fat and 7% or less from saturated fats, daily to have 5 or more servings of fruits and vegetables.  Biometrics: Pre Biometrics - 07/01/17 1313      Pre Biometrics   Height  _0  (1.651 m)    Weight  214 lb 1.6 oz (97.1 kg)    Waist Circumference  48.5 inches    Hip Circumference  47 inches    Waist to Hip Ratio  1.03 %    BMI (Calculated)  35.63    Triceps Skinfold  26 mm    % Body Fat  47.6 %    Grip Strength  28.93 kg    Flexibility  0 in    Single Leg Stand  0 seconds        Nutrition Therapy Plan and Nutrition Goals: Nutrition Therapy & Goals - 07/31/17 1526      Personal Nutrition Goals   Nutrition Goal  For heart healthy choices add >50% of whole grains, make  half their plate fruits and vegetables. Discuss the difference between starchy vegetables and leafy greens, and how leafy vegetables provide fiber, helps maintain healthy weight, helps control blood glucose, and lowers cholesterol.  Discuss purchasing fresh or frozen vegetable to reduce sodium and not to add grease, fat or sugar. Consume <18oz of red meat per week. Consume lean cuts of meats and very little of meats high in sodium and nitrates such as pork and lunch meats. Discussed portion control for all food groups.      Additional Goals?  No    Comments  Patient met with RD 07/31/17.      Intervention Plan   Intervention  Nutrition handout(s) given to patient.    Expected Outcomes  Short Term Goal: A plan has been developed with personal nutrition goals set during dietitian appointment.;Long Term Goal: Adherence to prescribed nutrition plan.       Nutrition Assessments: Nutrition Assessments - 07/01/17 1600      MEDFICTS Scores   Pre Score  62       Nutrition Goals Re-Evaluation: Nutrition Goals Re-Evaluation    Row Name 08/28/17 1518 09/18/17 1501 10/10/17 1349         Goals   Current Weight  208 lb 8 oz (94.6 kg)  211 lb 4.8 oz (95.8 kg)  215 lb 6.4 oz (97.7 kg)     Nutrition Goal  For heart healthy choices add >50% of whole grains, make half their plate fruits and vegetables. Discuss the difference between starchy vegetables and leafy greens, and how leafy vegetables  provide fiber, helps maintain healthy weight, helps control blood glucose, and lowers cholesterol.  Discuss purchasing fresh or frozen vegetable to reduce sodium and not to add grease, fat or sugar. Consume <18oz of red meat per week. Consume lean cuts of meats and very little of meats high in sodium and nitrates such as pork and lunch meats. Discussed portion control for all food groups.    For heart healthy choices add >50% of whole grains, make half their plate fruits and vegetables. Discuss the difference between  starchy vegetables and leafy greens, and how leafy vegetables provide fiber, helps maintain healthy weight, helps control blood glucose, and lowers cholesterol.  Discuss purchasing fresh or frozen vegetable to reduce sodium and not to add grease, fat or sugar. Consume <18oz of red meat per week. Consume lean cuts of meats and very little of meats high in sodium and nitrates such as pork and lunch meats. Discussed portion control for all food groups.    For heart healthy choices add >50% of whole grains, make half their plate fruits and vegetables. Discuss the difference between starchy vegetables and leafy greens, and how leafy vegetables provide fiber, helps maintain healthy weight, helps control blood glucose, and lowers cholesterol.  Discuss purchasing fresh or frozen vegetable to reduce sodium and not to add grease, fat or sugar. Consume <18oz of red meat per week. Consume lean cuts of meats and very little of meats high in sodium and nitrates such as pork and lunch meats. Discussed portion control for all food groups.       Comment  Patient has lost 5.6 lbs since last 30 day review. 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 Will continue to monitor for progress.   Patient has gained 3 lbs since last 30 day review. She continues to follow a regular diet. Will continue to monitor for progress.   Patient has gained 4 lbs since last 30 day review. She continues to follow a regular diet. Will continue to monitor for progress.      Expected Outcome  Patient will continue to lose weight.   Patient will continue to lose weight.   Patient will continue to lose weight.        Personal Goal #2 Re-Evaluation   Personal Goal #2  Patient eats a regular diet.   Patient eats a regular diet.   Patient eats a regular diet.         Nutrition Goals Discharge (Final Nutrition Goals Re-Evaluation): Nutrition Goals Re-Evaluation - 10/10/17 1349      Goals   Current Weight  215 lb 6.4 oz (97.7 kg)    Nutrition Goal  For heart  healthy choices add >50% of whole grains, make half their plate fruits and vegetables. Discuss the difference between starchy vegetables and leafy greens, and how leafy vegetables provide fiber, helps maintain healthy weight, helps control blood glucose, and lowers cholesterol.  Discuss purchasing fresh or frozen vegetable to reduce sodium and not to add grease, fat or sugar. Consume <18oz of red meat per week. Consume lean cuts of meats and very little of meats high in sodium and nitrates such as pork and lunch meats. Discussed portion control for all food groups.      Comment  Patient has gained 4 lbs since last 30 day review. She continues to follow a regular diet. Will continue to monitor for progress.     Expected Outcome  Patient will continue to lose weight.       Personal  Goal #2 Re-Evaluation   Personal Goal #2  Patient eats a regular diet.        Psychosocial: Target Goals: Acknowledge presence or absence of significant depression and/or stress, maximize coping skills, provide positive support system. Participant is able to verbalize types and ability to use techniques and skills needed for reducing stress and depression.  Initial Review & Psychosocial Screening: Initial Psych Review & Screening - 07/01/17 1603      Initial Review   Current issues with  Current Depression;Current Sleep Concerns      Family Dynamics   Good Support System?  Yes      Barriers   Psychosocial barriers to participate in program  Psychosocial barriers identified (see note) QOL score indicated depression at 15.56 overall.      Screening Interventions   Interventions  Encouraged to exercise;Provide feedback about the scores to participant    Expected Outcomes  Short Term goal: Identification and review with participant of any Quality of Life or Depression concerns found by scoring the questionnaire.;Long Term goal: The participant improves quality of Life and PHQ9 Scores as seen by post scores and/or  verbalization of changes       Quality of Life Scores: Quality of Life - 07/01/17 1314      Quality of Life Scores   Health/Function Pre  13.27 %    Socioeconomic Pre  11.33 %    Psych/Spiritual Pre  18.07 %    Family Pre  24 %    GLOBAL Pre  15.56 %      Scores of 19 and below usually indicate a poorer quality of life in these areas.  A difference of  2-3 points is a clinically meaningful difference.  A difference of 2-3 points in the total score of the Quality of Life Index has been associated with significant improvement in overall quality of life, self-image, physical symptoms, and general health in studies assessing change in quality of life.   PHQ-9: Recent Review Flowsheet Data    Depression screen [facility] 2/9 07/01/2017   Decreased Interest 0   Down, Depressed, Hopeless 0   PHQ - 2 Score 0   Altered sleeping 2   Tired, decreased energy 3   Change in appetite 1   Feeling bad or failure about yourself  0   Trouble concentrating 0   Moving slowly or fidgety/restless 0   Suicidal thoughts 0   PHQ-9 Score 6   Difficult doing work/chores Somewhat difficult     Interpretation of Total Score  Total Score Depression Severity:  1-4 = Minimal depression, 5-9 = Mild depression, 10-14 = Moderate depression, 15-19 = Moderately severe depression, 20-27 = Severe depression   Psychosocial Evaluation and Intervention: Psychosocial Evaluation - 07/01/17 1604      Psychosocial Evaluation & Interventions   Interventions  Encouraged to exercise with the program and follow exercise prescription    Comments  Patient does not want to be referred to a therapist.     Continue Psychosocial Services   Follow up required by staff       Psychosocial Re-Evaluation: Psychosocial Re-Evaluation    [place] Name 07/31/17 1342 08/28/17 1523 09/18/17 1505 10/10/17 1352       Psychosocial Re-Evaluation   Current issues with  Current Depression;Current Sleep Concerns  Current Depression;Current Sleep  Concerns  Current Depression;Current Sleep Concerns  Current Depression;Current Sleep Concerns    Comments  Patient's initial QOL score was 15.56 and her PHQ-9 score was 6 indicating depression. She  says her does not feel she needs treatment at this time.   Patient's initial QOL score was 15.56 and her PHQ-9 score was 6 indicating depression. She says her does not feel she needs treatment at this time.   Patient's initial QOL score was 15.56 and her PHQ-9 score was 6 indicating depression. She says her does not feel she needs treatment at this time. Still reports difficulty sleeping.   Patient's initial QOL score was 15.56 and her PHQ-9 score was 6 indicating depression. She says she does not feel she needs treatment at this time. Still reports difficulty sleeping.     Expected Outcomes  Patient's QOL and PHQ-9 scores will improve or remain the same at discharge and she will have no additional psychosocial issues identified.   Patient's QOL and PHQ-9 scores will improve or remain the same at discharge and she will have no additional psychosocial issues identified.   Patient's QOL and PHQ-9 scores will improve or remain the same at discharge and she will have no additional psychosocial issues identified.   Patient's QOL and PHQ-9 scores will improve or remain the same at discharge and she will have no additional psychosocial issues identified.     Interventions  Stress management education;Encouraged to attend Cardiac Rehabilitation for the exercise;Relaxation education  Stress management education;Encouraged to attend Cardiac Rehabilitation for the exercise;Relaxation education  Stress management education;Encouraged to attend Cardiac Rehabilitation for the exercise;Relaxation education  Stress management education;Encouraged to attend Cardiac Rehabilitation for the exercise;Relaxation education    Continue Psychosocial Services   No Follow up required  No Follow up required  No Follow up required  No Follow up  required       Psychosocial Discharge (Final Psychosocial Re-Evaluation): Psychosocial Re-Evaluation - 10/10/17 1352      Psychosocial Re-Evaluation   Current issues with  Current Depression;Current Sleep Concerns    Comments  Patient's initial QOL score was 15.56 and her PHQ-9 score was 6 indicating depression. She says she does not feel she needs treatment at this time. Still reports difficulty sleeping.     Expected Outcomes  Patient's QOL and PHQ-9 scores will improve or remain the same at discharge and she will have no additional psychosocial issues identified.     Interventions  Stress management education;Encouraged to attend Cardiac Rehabilitation for the exercise;Relaxation education    Continue Psychosocial Services   No Follow up required        Education: Education Goals: Education classes will be provided on a weekly basis, covering required topics. Participant will state understanding/return demonstration of topics presented.  Learning Barriers/Preferences: Learning Barriers/Preferences - 07/01/17 1518      Learning Barriers/Preferences   Learning Barriers  None    Learning Preferences  Verbal Instruction;Skilled Demonstration;Group Instruction       Education Topics: How Lungs Work and Diseases: - Discuss the anatomy of the lungs and diseases that can affect the lungs, such as COPD.   PULMONARY REHAB OTHER RESPIRATORY from 10/09/2017 in [place]  Date  09/04/17  Educator  DWynetta Emery  Instruction Review Code  2- Demonstrated Understanding      Exercise: -Discuss the importance of exercise, FITT principles of exercise, normal and abnormal responses to exercise, and how to exercise safely.   PULMONARY REHAB OTHER RESPIRATORY from 10/09/2017 in [place]  Date  08/28/17  Educator  DC  Instruction Review Code  2- Demonstrated Understanding      Environmental Irritants: -Discuss types of environmental irritants  and how to limit exposure to environmental irritants.   PULMONARY REHAB OTHER RESPIRATORY from 10/09/2017 in [place]  Date  09/11/17  Educator  DJ  Instruction Review Code  2- Demonstrated Understanding      Meds/Inhalers and oxygen: - Discuss respiratory medications, definition of an inhaler and oxygen, and the proper way to use an inhaler and oxygen.   PULMONARY REHAB OTHER RESPIRATORY from 10/09/2017 in [place]  Date  09/18/17  Educator  DC      Energy Saving Techniques: - Discuss methods to conserve energy and decrease shortness of breath when performing activities of daily living.    PULMONARY REHAB OTHER RESPIRATORY from 10/09/2017 in [place]  Date  09/25/17  Educator  DC  Instruction Review Code  2- Demonstrated Understanding      Bronchial Hygiene / Breathing Techniques: - Discuss breathing mechanics, pursed-lip breathing technique,  proper posture, effective ways to clear airways, and other functional breathing techniques   PULMONARY REHAB OTHER RESPIRATORY from 10/09/2017 in [place]  Date  10/02/17  Educator  DJ  Instruction Review Code  2- Demonstrated Understanding      Cleaning Equipment: - Provides group verbal and written instruction about the health risks of elevated stress, cause of high stress, and healthy ways to reduce stress.   Nutrition I: Fats: - Discuss the types of cholesterol, what cholesterol does to the body, and how cholesterol levels can be controlled.   PULMONARY REHAB OTHER RESPIRATORY from 10/09/2017 in [place]  Date  07/17/17  Educator  DJ  Instruction Review Code  2- Demonstrated Understanding      Nutrition II: Labels: -Discuss the different components of food labels and how to read food labels.   PULMONARY REHAB OTHER RESPIRATORY from 10/09/2017 in [place]  Date  07/24/17  Educator   DJ  Instruction Review Code  2- Demonstrated Understanding      Respiratory Infections: - Discuss the signs and symptoms of respiratory infections, ways to prevent respiratory infections, and the importance of seeking medical treatment when having a respiratory infection.   PULMONARY REHAB OTHER RESPIRATORY from 10/09/2017 in [place]  Date  07/31/17  Educator  DJ  Instruction Review Code  2- Demonstrated Understanding      Stress I: Signs and Symptoms: - Discuss the causes of stress, how stress may lead to anxiety and depression, and ways to limit stress.   PULMONARY REHAB OTHER RESPIRATORY from 10/09/2017 in [place]  Date  08/07/17  Educator  DJ  Instruction Review Code  2- Demonstrated Understanding      Stress II: Relaxation: -Discuss relaxation techniques to limit stress.   PULMONARY REHAB OTHER RESPIRATORY from 10/09/2017 in [place]  Date  08/14/17  Educator  DJ  Instruction Review Code  2- Demonstrated Understanding      Oxygen for Home/Travel: - Discuss how to prepare for travel when on oxygen and proper ways to transport and store oxygen to ensure safety.   PULMONARY REHAB OTHER RESPIRATORY from 10/09/2017 in [place]  Date  08/21/17  Educator  DC  Instruction Review Code  2- Demonstrated Understanding      Knowledge Questionnaire Score: Knowledge Questionnaire Score - 07/01/17 1539      Knowledge Questionnaire Score   Pre Score  15/18       Core Components/Risk Factors/Patient Goals at Admission: Personal Goals and Risk  Factors at Admission - 07/01/17 1600      Core Components/Risk Factors/Patient Goals on Admission    Weight Management  Yes    Intervention  Weight Management/Obesity: Establish reasonable short term and long term weight goals.;Obesity: Provide education and appropriate resources to help participant work on and attain dietary goals.     Admit Weight  214 lb 1.6 oz (97.1 kg)    Goal Weight: Short Term  204 lb 1.6 oz (92.6 kg)    Goal Weight: Long Term  194 lb 1.6 oz (88 kg)    Expected Outcomes  Short Term: Continue to assess and modify interventions until short term weight is achieved;Long Term: Adherence to nutrition and physical activity/exercise program aimed toward attainment of established weight goal    Personal Goal Other  Yes    Personal Goal  Better mobility, lose 25-30lbs    Intervention  Attend PR 2 x week and supplement at home exercise 3 x week.    Expected Outcomes  Reach personal goals.       Core Components/Risk Factors/Patient Goals Review:  Goals and Risk Factor Review    Row Name 07/31/17 1337 08/28/17 1521 09/18/17 1502 10/10/17 1349       Core Components/Risk Factors/Patient Goals Review   Personal Goals Review  Weight Management/Obesity;Increase knowledge of respiratory medications and ability to use respiratory devices properly.;Improve shortness of breath with ADL's Better mobility; lose weight 25-30 lbs;  Do yard work.   Weight Management/Obesity;Increase knowledge of respiratory medications and ability to use respiratory devices properly.;Improve shortness of breath with ADL's Better mobility; lose weight 25-30 lbs. Do yard work again.   Weight Management/Obesity;Increase knowledge of respiratory medications and ability to use respiratory devices properly.;Improve shortness of breath with ADL's Better mobility; lose weight 25-30 lbs; Do yard work again.   Weight Management/Obesity;Increase knowledge of respiratory medications and ability to use respiratory devices properly.;Improve shortness of breath with ADL's Better mobility; lose weight 25-30 lbs; do yard work again.     Review  Patient has completed 7 sessions losing 3.5 lbs. She is doing well in the program with some progression. She says it is too soon to tell if the program is making a difference. She hopes to meet her goals as she continues the  program. Will continue to monitor for progress.   Patient has completed 15 sessions losing 4 lbs. She continues to do well in the program with progression. She says the program is helping her. She feels stronger and has more endurance to do things. She says she feels like she could do yard work but is taking a medication that does no allow to get out in the sun or sweat excessively. She is afraid to try. She is very pleased with her progress. Will continue to monitor for progress.   Patient has completed 21 sessions gaining 3 lbs since last 30 day review. She continues to do well in the program with progression. She continues to say the program is helping her. She says she has more endurance and more energy to do things. She says she feels like she has gained some strength since last 30 day review. Will continue to monitor for progress.   Patient has completed 27 sessions gaining 4 lbs since last 30 day review. She blames weight gain on fluid. She continues to do well in the program with progression. She says she is continuing to get stronger and has more stamina. She says she feels she has gained more mobility in  her upper extremtities and feels her balance has improved with better walking. She is very pleased with her progress. Will continue to monitor.     Expected Outcomes  Patient will continue to attend sessions and complete the program.   Patient will continue to attend sessions and complete the program.   Patient will continue to attend sessions and complete the program.   Patient will continue to attend sessions and complete the program.        Core Components/Risk Factors/Patient Goals at Discharge (Final Review):  Goals and Risk Factor Review - 10/10/17 1349      Core Components/Risk Factors/Patient Goals Review   Personal Goals Review  Weight Management/Obesity;Increase knowledge of respiratory medications and ability to use respiratory devices properly.;Improve shortness of breath with ADL's  Better mobility; lose weight 25-30 lbs; do yard work again.     Review  Patient has completed 27 sessions gaining 4 lbs since last 30 day review. She blames weight gain on fluid. She continues to do well in the program with progression. She says she is continuing to get stronger and has more stamina. She says she feels she has gained more mobility in her upper extremtities and feels her balance has improved with better walking. She is very pleased with her progress. Will continue to monitor.     Expected Outcomes  Patient will continue to attend sessions and complete the program.        ITP Comments: ITP Comments    Row Name 07/01/17 1543 07/03/17 1336         ITP Comments  Mrs. 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 She has also been diagnosed with Myasthenia Gravis She is depressed according to her QOL scores. We will continue to address this area as she comes through our program.   Patient new to program. Plans to start Tuesday 07/08/17. Will continue to monitor for progress.          Comments: ITP 30 Day REVIEW Pt is making expected progress toward pulmonary rehab goals after completing 27 sessions. Recommend continued exercise, life style modification, education, and utilization of breathing techniques to increase stamina and strength and decrease shortness of breath with exertion.

## 2017-10-14 ENCOUNTER — Encounter (HOSPITAL_COMMUNITY)
Admission: RE | Admit: 2017-10-14 | Discharge: 2017-10-14 | Disposition: A | Discharge status: Home / Self Care | Payer: PPO | Source: Ambulatory Visit | Attending: Internal Medicine | Admitting: Internal Medicine

## 2017-10-14 DIAGNOSIS — J849 Interstitial pulmonary disease, unspecified: Secondary | ICD-10-CM | POA: Diagnosis not present

## 2017-10-14 NOTE — Progress Notes (Signed)
Daily Session Note  Patient Details  Name: GAEL DELUDE MRN: [id number] Date of Birth: 1945/10/21 Referring Provider:     PULMONARY REHAB OTHER RESP ORIENTATION from 07/01/2017 in [place]  Referring Provider  Dr. Chase Caller       Encounter Date: 10/14/2017  Check In: Session Check In - 10/14/17 1045      Check-In   Location  [facility]    Staff Present  Kordel Leavy Angelina Pih, MS, EP, [facility], Exercise Physiologist;Gregory Luther Parody, BS, EP, Exercise Physiologist    Supervising physician immediately available to respond to emergencies  See telemetry face sheet for immediately available MD    Medication changes reported      No    Fall or balance concerns reported     Yes    Warm-up and Cool-down  Performed as group-led instruction    Resistance Training Performed  Yes    VAD Patient?  No      Pain Assessment   Currently in Pain?  No/denies    Pain Score  0-No pain    Multiple Pain Sites  No       Capillary Blood Glucose: No results found for this or any previous visit (from the past 24 hour(s)).    Social History   Tobacco Use  Smoking Status Never Smoker  Smokeless Tobacco Never Used    Goals Met:  Proper associated with RPD/PD & O2 Sat Improved SOB with ADL's Using PLB without cueing & demonstrates good technique Achieving weight loss Exercise tolerated well Personal goals reviewed No report of cardiac concerns or symptoms Strength training completed today  Goals Unmet:  Not Applicable  Comments: Check out: [place]   Dr. Sinda Du is Medical Director for [facility].

## 2017-10-16 ENCOUNTER — Encounter (HOSPITAL_COMMUNITY)
Admission: RE | Admit: 2017-10-16 | Discharge: 2017-10-16 | Disposition: A | Discharge status: Home / Self Care | Payer: PPO | Source: Ambulatory Visit | Attending: Internal Medicine | Admitting: Internal Medicine

## 2017-10-16 DIAGNOSIS — J849 Interstitial pulmonary disease, unspecified: Secondary | ICD-10-CM | POA: Diagnosis not present

## 2017-10-16 NOTE — Progress Notes (Signed)
Daily Session Note  Patient Details  Name: [name] MRN: [id number] Date of Birth: [birth year] Referring Provider:     PULMONARY REHAB OTHER RESP ORIENTATION from 07/01/2017 in [place]  Referring Provider  Dr. Chase Caller       Encounter Date: 10/16/2017  Check In: Session Check In - 10/16/17 1045      Check-In   Location  [facility]    Staff Present  Diane Angelina Pih, MS, EP, [facility], Exercise Physiologist;Gregory Luther Parody, BS, EP, Exercise Physiologist;Farron Watrous Wynetta Emery, RN, BSN    Supervising physician immediately available to respond to emergencies  See telemetry face sheet for immediately available MD    Medication changes reported      No    Fall or balance concerns reported     Yes    Comments  Has fallen twice in the past 12 months. She also fell 09/24/17.     Warm-up and Cool-down  Performed as group-led Higher education careers adviser Performed  Yes    VAD Patient?  No      Pain Assessment   Currently in Pain?  No/denies    Pain Score  0-No pain    Multiple Pain Sites  No       Capillary Blood Glucose: No results found for this or any previous visit (from the past 24 hour(s)).    Social History   Tobacco Use  Smoking Status Never Smoker  Smokeless Tobacco Never Used    Goals Met:  Proper associated with RPD/PD & O2 Sat Independence with exercise equipment Improved SOB with ADL's Using PLB without cueing & demonstrates good technique Exercise tolerated well No report of cardiac concerns or symptoms Strength training completed today  Goals Unmet:  Not Applicable  Comments: Check out 1145.   Dr. Sinda Du is Medical Director for [facility] Pul[facility].

## 2017-10-21 ENCOUNTER — Encounter (HOSPITAL_COMMUNITY)
Admission: RE | Admit: 2017-10-21 | Discharge: 2017-10-21 | Disposition: A | Discharge status: Home / Self Care | Payer: PPO | Source: Ambulatory Visit | Attending: Internal Medicine | Admitting: Internal Medicine

## 2017-10-21 DIAGNOSIS — J849 Interstitial pulmonary disease, unspecified: Secondary | ICD-10-CM | POA: Diagnosis not present

## 2017-10-21 NOTE — Progress Notes (Signed)
Daily Session Note  Patient Details  Name: [name] MRN: [id number] Date of Birth: 11-23-1945 Referring Provider:     PULMONARY REHAB OTHER RESP ORIENTATION from 07/01/2017 in [place]  Referring Provider  Dr. Chase Caller       Encounter Date: 10/21/2017  Check In: Session Check In - 10/21/17 1045      Check-In   Location  [facility]    Staff Present  Hallis Meditz Angelina Pih, MS, EP, [facility], Exercise Physiologist;Gregory Luther Parody, BS, EP, Exercise Physiologist    Supervising physician immediately available to respond to emergencies  See telemetry face sheet for immediately available MD    Medication changes reported      No    Fall or balance concerns reported     Yes    Warm-up and Cool-down  Performed as group-led instruction    Resistance Training Performed  Yes    VAD Patient?  No    PAD/SET Patient?  No      Pain Assessment   Currently in Pain?  No/denies    Pain Score  0-No pain    Multiple Pain Sites  No       Capillary Blood Glucose: No results found for this or any previous visit (from the past 24 hour(s)).    Social History   Tobacco Use  Smoking Status Never Smoker  Smokeless Tobacco Never Used    Goals Met:  Independence with exercise equipment Improved SOB with ADL's Exercise tolerated well Queuing for purse lip breathing No report of cardiac concerns or symptoms Strength training completed today  Goals Unmet:  Not Applicable  Comments: Check out: [place]   Dr. Sinda Du is Medical Director for [facility] Pul[facility] Rehab.

## 2017-10-23 ENCOUNTER — Encounter (HOSPITAL_COMMUNITY)
Admission: RE | Admit: 2017-10-23 | Discharge: 2017-10-23 | Disposition: A | Discharge status: Home / Self Care | Payer: PPO | Source: Ambulatory Visit | Attending: Internal Medicine | Admitting: Internal Medicine

## 2017-10-23 DIAGNOSIS — J849 Interstitial pulmonary disease, unspecified: Secondary | ICD-10-CM

## 2017-10-23 NOTE — Progress Notes (Signed)
Daily Session Note  Patient Details  Name: [name] MRN: [id number] Date of Birth: Jul 20, 1945 Referring Provider:     PULMONARY REHAB OTHER RESP ORIENTATION from 07/01/2017 in [place]  Referring Provider  Dr. Chase Caller       Encounter Date: 10/23/2017  Check In: Session Check In - 10/23/17 1045      Check-In   Location  [facility]    Staff Present  Diane Angelina Pih, MS, EP, [facility], Exercise Physiologist;Gregory Luther Parody, BS, EP, Exercise Physiologist;Burdette Gergely Wynetta Emery, RN, BSN    Supervising physician immediately available to respond to emergencies  See telemetry face sheet for immediately available MD    Medication changes reported      No    Fall or balance concerns reported     Yes    Comments  Has fallen twice in the past 12 months. She also fell 09/24/17.     Warm-up and Cool-down  Performed as group-led Higher education careers adviser Performed  Yes    VAD Patient?  No    PAD/SET Patient?  No      Pain Assessment   Currently in Pain?  No/denies    Pain Score  0-No pain    Multiple Pain Sites  No       Capillary Blood Glucose: No results found for this or any previous visit (from the past 24 hour(s)).    Social History   Tobacco Use  Smoking Status Never Smoker  Smokeless Tobacco Never Used    Goals Met:  Proper associated with RPD/PD & O2 Sat Independence with exercise equipment Improved SOB with ADL's Using PLB without cueing & demonstrates good technique Exercise tolerated well No report of cardiac concerns or symptoms Strength training completed today  Goals Unmet:  Not Applicable  Comments: Check out 1145.   Dr. Sinda Du is Medical Director for [facility] Pulmonary Rehab.

## 2017-10-28 ENCOUNTER — Encounter (HOSPITAL_COMMUNITY)
Admission: RE | Admit: 2017-10-28 | Discharge: 2017-10-28 | Disposition: A | Discharge status: Home / Self Care | Payer: PPO | Source: Ambulatory Visit | Attending: Internal Medicine | Admitting: Internal Medicine

## 2017-10-28 DIAGNOSIS — Z7984 Long term (current) use of oral hypoglycemic drugs: Secondary | ICD-10-CM | POA: Diagnosis not present

## 2017-10-28 DIAGNOSIS — E785 Hyperlipidemia, unspecified: Secondary | ICD-10-CM | POA: Insufficient documentation

## 2017-10-28 DIAGNOSIS — G4730 Sleep apnea, unspecified: Secondary | ICD-10-CM | POA: Diagnosis not present

## 2017-10-28 DIAGNOSIS — E119 Type 2 diabetes mellitus without complications: Secondary | ICD-10-CM | POA: Insufficient documentation

## 2017-10-28 DIAGNOSIS — I10 Essential (primary) hypertension: Secondary | ICD-10-CM | POA: Diagnosis not present

## 2017-10-28 DIAGNOSIS — K219 Gastro-esophageal reflux disease without esophagitis: Secondary | ICD-10-CM | POA: Diagnosis not present

## 2017-10-28 DIAGNOSIS — M797 Fibromyalgia: Secondary | ICD-10-CM | POA: Diagnosis not present

## 2017-10-28 DIAGNOSIS — Z79899 Other long term (current) drug therapy: Secondary | ICD-10-CM | POA: Diagnosis not present

## 2017-10-28 DIAGNOSIS — J849 Interstitial pulmonary disease, unspecified: Secondary | ICD-10-CM

## 2017-10-28 DIAGNOSIS — Z7982 Long term (current) use of aspirin: Secondary | ICD-10-CM | POA: Insufficient documentation

## 2017-10-28 NOTE — Progress Notes (Signed)
Daily Session Note  Patient Details  Name: [name] MRN: [id number] Date of Birth: 02/16/1946 Referring Provider:     PULMONARY REHAB OTHER RESP ORIENTATION from 07/01/2017 in [place] [facility] [place]  Referring Provider  Dr. Chase Caller       Encounter Date: 10/28/2017  Check In:  Session Check In - 10/28/17 1045      Check-In   Location  [facility]    Staff Present  Diane Angelina Pih, MS, EP, [facility], Exercise Physiologist;Ronak Duquette Luther Parody, BS, EP, Exercise Physiologist    Supervising physician immediately available to respond to emergencies  See telemetry face sheet for immediately available MD    Medication changes reported      No    Fall or balance concerns reported     Yes    Comments  Has fallen twice in the past 12 months. She also fell 09/24/17.     Warm-up and Cool-down  Performed as group-led Higher education careers adviser Performed  Yes    VAD Patient?  No    PAD/SET Patient?  No      Pain Assessment   Currently in Pain?  No/denies    Pain Score  0-No pain    Multiple Pain Sites  No       Capillary Blood Glucose: No results found for this or any previous visit (from the past 24 hour(s)).  Exercise Prescription Changes - 10/28/17 0800      Response to Exercise   Blood Pressure (Admit)  112/60    Blood Pressure (Exercise)  146/70    Blood Pressure (Exit)  140/60    Heart Rate (Admit)  73 bpm    Heart Rate (Exercise)  83 bpm    Heart Rate (Exit)  81 bpm    Oxygen Saturation (Admit)  97 %    Oxygen Saturation (Exercise)  98 %    Oxygen Saturation (Exit)  97 %    Rating of Perceived Exertion (Exercise)  11    Perceived Dyspnea (Exercise)  11    Duration  Progress to 30 minutes of  aerobic without signs/symptoms of physical distress    Intensity  [facility] [phone]      Progression   Progression  Continue to progress workloads to maintain intensity without signs/symptoms of physical distress.      Resistance Training   Training  Prescription  Yes    Weight  2    Reps  10-15      NuStep   Level  3    SPM  94    Minutes  15    METs  2      Arm Ergometer   Level  1.7    Watts  26    RPM  16    Minutes  20    METs  3      Home Exercise Plan   Plans to continue exercise at  Home (comment)    Frequency  Add 2 additional days to program exercise sessions.    Initial Home Exercises Provided  07/15/17       Social History   Tobacco Use  Smoking Status Never Smoker  Smokeless Tobacco Never Used    Goals Met:  Independence with exercise equipment Improved SOB with ADL's Using PLB without cueing & demonstrates good technique Exercise tolerated well  Goals Unmet:  Not Applicable  Comments: Check out 1145   Dr. Sinda Du is Medical Director for [facility] Pulmonary Rehab.

## 2017-10-30 ENCOUNTER — Encounter (HOSPITAL_COMMUNITY): Payer: PPO

## 2017-11-04 ENCOUNTER — Encounter (HOSPITAL_COMMUNITY)
Admission: RE | Admit: 2017-11-04 | Discharge: 2017-11-04 | Disposition: A | Discharge status: Home / Self Care | Payer: PPO | Source: Ambulatory Visit | Attending: Internal Medicine | Admitting: Internal Medicine

## 2017-11-04 DIAGNOSIS — J849 Interstitial pulmonary disease, unspecified: Secondary | ICD-10-CM | POA: Diagnosis not present

## 2017-11-04 NOTE — Progress Notes (Signed)
Daily Session Note  Patient Details  Name: [name] MRN: [id number] Date of Birth: Jul 13, 1945 Referring Provider:     PULMONARY REHAB OTHER RESP ORIENTATION from 07/01/2017 in [place]  Referring Provider  Dr. Chase Caller       Encounter Date: 11/04/2017  Check In: Session Check In - 11/04/17 1045      Check-In   Location  [facility]    Staff Present  Diane Angelina Pih, MS, EP, [facility], Exercise Physiologist;Vernee Baines Luther Parody, BS, EP, Exercise Physiologist    Supervising physician immediately available to respond to emergencies  See telemetry face sheet for immediately available MD    Medication changes reported      No    Fall or balance concerns reported     Yes    Comments  Has fallen twice in the past 12 months. She also fell 09/24/17.     Warm-up and Cool-down  Performed as group-led Higher education careers adviser Performed  Yes    VAD Patient?  No    PAD/SET Patient?  No      Pain Assessment   Currently in Pain?  No/denies    Pain Score  0-No pain    Multiple Pain Sites  No       Capillary Blood Glucose: No results found for this or any previous visit (from the past 24 hour(s)).    Social History   Tobacco Use  Smoking Status Never Smoker  Smokeless Tobacco Never Used    Goals Met:  Independence with exercise equipment Improved SOB with ADL's Using PLB without cueing & demonstrates good technique Exercise tolerated well No report of cardiac concerns or symptoms Strength training completed today  Goals Unmet:  Not Applicable  Comments: Check out 1145   Dr. Sinda Du is Medical Director for [facility] Pulmonary Rehab.

## 2017-11-05 NOTE — Progress Notes (Signed)
Pulmonary Individual Treatment Plan  Patient Details  Name: ASHLON LOTTMAN MRN: [id number] Date of Birth: 1946/01/25 Referring Provider:     PULMONARY REHAB OTHER RESP ORIENTATION from 07/01/2017 in [place]  Referring Provider  Dr. Chase Caller       Initial Encounter Date:    [place] from 07/01/2017 in [place]  Date  07/01/17      Visit Diagnosis: ILD (interstitial lung disease) ([place])  Patient's Home Medications on Admission:   Current Outpatient Medications:  .  ACCU-CHEK AVIVA PLUS test strip, Apply 1 strip topically daily. for testing, Disp: , Rfl: 1 .  ACCU-CHEK SOFTCLIX LANCETS lancets, TEST DAILY E11.9, Disp: , Rfl: 1 .  amoxicillin (AMOXIL) 875 MG tablet, Take 875 mg by mouth 2 (two) times daily., Disp: , Rfl:  .  APPLE CIDER VINEGAR PO, Take by mouth daily. , Disp: , Rfl:  .  aspirin EC 81 MG tablet, Take 81 mg by mouth daily., Disp: , Rfl:  .  Calcium Carb-Cholecalciferol (CALCIUM+D3) 600-800 MG-UNIT TABS, Take 1 tablet by mouth daily., Disp: , Rfl:  .  cetirizine (ZYRTEC) 10 MG tablet, Take 10 mg by mouth daily., Disp: , Rfl: 0 .  co-enzyme Q-10 50 MG capsule, Take 100 mg by mouth daily., Disp: , Rfl:  .  [employer] NATURAL LUTEIN EYE HEALTH PO, Take 1 capsule by mouth daily. , Disp: , Rfl:  .  diclofenac sodium (VOLTAREN) 1 % GEL, Apply 1 application topically 2 (two) times daily., Disp: , Rfl:  .  fluticasone (FLONASE) 50 MCG/ACT nasal spray, Place 2 sprays into both nostrils daily. , Disp: , Rfl:  .  gabapentin (NEURONTIN) 100 MG capsule, Take 100 mg by mouth 2 (two) times daily., Disp: , Rfl:  .  gabapentin (NEURONTIN) 300 MG capsule, Take by mouth daily. , Disp: , Rfl:  .  Ginger, Zingiber officinalis, (GINGER ROOT) 550 MG CAPS, Take 1 capsule by mouth daily., Disp: , Rfl:  .  glipiZIDE (GLUCOTROL XL) 10 MG 24 hr tablet, TAKE 1 TABLET BY MOUTH AFTER LUNCH AND SUPPER, Disp: , Rfl:  .   levocarnitine (CARNITOR) 250 MG capsule, Take 500 mg by mouth daily., Disp: , Rfl:  .  losartan-hydrochlorothiazide (HYZAAR) 100-25 MG tablet, TAKE (1) TABLET BY MOUTH ONCE DAILY., Disp: , Rfl:  .  metFORMIN (GLUCOPHAGE-XR) 500 MG 24 hr tablet, TAKE 2 TABLETS TWICE DAILY AS DIRECTED., Disp: , Rfl:  .  Misc Natural Products (TART CHERRY ADVANCED) CAPS, Take by mouth., Disp: , Rfl:  .  Misc. Devices MISC, Avivia Plus lancets and strips. Tests daily  DX E11.9, Disp: , Rfl:  .  naproxen sodium (ANAPROX) 550 MG tablet, , Disp: , Rfl:  .  Nutritional Supplements (EQ ESTROBLEND MENOPAUSE) TABS, Take 1 tablet by mouth daily., Disp: , Rfl:  .  omega-3 acid ethyl esters (LOVAZA) 1 g capsule, Take 1 g by mouth daily., Disp: , Rfl:  .  pregabalin (LYRICA) 50 MG capsule, Take 50 mg by mouth 2 (two) times daily. , Disp: , Rfl:  .  pyridostigmine (MESTINON) 60 MG tablet, Takes 1/2 tablet four times per day, Disp: 120 tablet, Rfl: 6 .  rosuvastatin (CRESTOR) 5 MG tablet, Take 5 mg by mouth daily at 6 PM., Disp: , Rfl:  .  sitaGLIPtin (JANUVIA) 100 MG tablet, TAKE (1) TABLET BY MOUTH ONCE DAILY., Disp: , Rfl:  .  Turmeric Curcumin 500 MG CAPS, Take 1 capsule by mouth daily.,  Disp: , Rfl:   Past Medical History: Past Medical History:  Diagnosis Date  . Arthritis   . Bursitis   . Diabetes mellitus without complication ([place])   . Fibromyalgia   . GERD (gastroesophageal reflux disease)   . Hyperlipemia   . Hypertension   . Sinusitis   . Sleep apnea    uses a cpap  . Sleep apnea   . Wears glasses     Tobacco Use: Social History   Tobacco Use  Smoking Status Never Smoker  Smokeless Tobacco Never Used    Labs: Recent Review Scientist, physiological    Labs for ITP Cardiac and Pulmonary Rehab Latest Ref Rng & Units 01/07/2011   TCO2 0 - 100 mmol/L 25      Capillary Blood Glucose: Lab Results  Component Value Date   GLUCAP 140 (H) 09/09/2012     Pulmonary Assessment Scores: Pulmonary Assessment  Scores    Row Name 07/01/17 1540         ADL UCSD   ADL Phase  Entry     SOB Score total  33     Rest  1     Walk  10     Stairs  4     Bath  0     Dress  0     Shop  0       CAT Score   CAT Score  11       mMRC Score   mMRC Score  3        Pulmonary Function Assessment: Pulmonary Function Assessment - 07/01/17 1539      Pulmonary Function Tests   FVC%  93 %    FEV1%  110 %    FEV1/FVC Ratio  117    RV%  35 %    DLCO%  71 %      Breath   Bilateral Breath Sounds  Clear    Shortness of Breath  Yes       Exercise Target Goals:    Exercise Program Goal: Individual exercise prescription set using results from initial 6 min walk test and THRR while considering  patient's activity barriers and safety.   Exercise Prescription Goal: Initial exercise prescription builds to 30-45 minutes a day of aerobic activity, 2-3 days per week.  Home exercise guidelines will be given to patient during program as part of exercise prescription that the participant will acknowledge.  Activity Barriers & Risk Stratification: Activity Barriers & Cardiac Risk Stratification - 07/01/17 1308      Activity Barriers & Cardiac Risk Stratification   Activity Barriers  Neck/Spine Problems;History of Falls;Deconditioning;Balance Concerns;Assistive Device;Shortness of Breath    Cardiac Risk Stratification  High       6 Minute Walk: 6 Minute Walk    Row Name 07/01/17 1411         6 Minute Walk   Phase  Initial     Distance  950 feet     Distance % Change  0 %     Distance Feet Change  0 ft     Walk Time  6 minutes     # of Rest Breaks  0     MPH  1.79     METS  2.37     RPE  11     Perceived Dyspnea   13     VO2 Peak  8.17     Symptoms  No     Resting HR  82 bpm  Resting BP  132/64     Resting Oxygen Saturation   97 %     Exercise Oxygen Saturation  during 6 min walk  97 %     Max Ex. HR  112 bpm     Max Ex. BP  180/76     2 Minute Post BP  136/64        Oxygen  Initial Assessment: Oxygen Initial Assessment - 07/01/17 1556      Home Oxygen   Home Oxygen Device  None    Sleep Oxygen Prescription  None    Home Exercise Oxygen Prescription  None    Home at Rest Exercise Oxygen Prescription  None      Initial 6 min Walk   Oxygen Used  None      Program Oxygen Prescription   Program Oxygen Prescription  None       Oxygen Re-Evaluation: Oxygen Re-Evaluation    Row Name 07/31/17 1335 08/28/17 1518 09/18/17 1501 10/10/17 1348 11/05/17 1501     Program Oxygen Prescription   Program Oxygen Prescription  None  None  None  None  None     Home Oxygen   Home Oxygen Device  None  None  None  None  None   Sleep Oxygen Prescription  None  None  None  None  None   Home Exercise Oxygen Prescription  None  None  None  None  None   Home at Rest Exercise Oxygen Prescription  None  None  None  None  None     Goals/Expected Outcomes   Short Term Goals  To learn and understand importance of monitoring SPO2 with pulse oximeter and demonstrate accurate use of the pulse oximeter.;To learn and understand importance of maintaining oxygen saturations>88%;To learn and demonstrate proper pursed lip breathing techniques or other breathing techniques.  To learn and understand importance of monitoring SPO2 with pulse oximeter and demonstrate accurate use of the pulse oximeter.;To learn and understand importance of maintaining oxygen saturations>88%;To learn and demonstrate proper pursed lip breathing techniques or other breathing techniques.  To learn and understand importance of monitoring SPO2 with pulse oximeter and demonstrate accurate use of the pulse oximeter.;To learn and understand importance of maintaining oxygen saturations>88%;To learn and demonstrate proper pursed lip breathing techniques or other breathing techniques.  To learn and understand importance of monitoring SPO2 with pulse oximeter and demonstrate accurate use of the pulse oximeter.;To learn and  understand importance of maintaining oxygen saturations>88%;To learn and demonstrate proper pursed lip breathing techniques or other breathing techniques.  To learn and understand importance of monitoring SPO2 with pulse oximeter and demonstrate accurate use of the pulse oximeter.;To learn and understand importance of maintaining oxygen saturations>88%;To learn and demonstrate proper pursed lip breathing techniques or other breathing techniques.   Long  Term Goals  Verbalizes importance of monitoring SPO2 with pulse oximeter and return demonstration;Maintenance of O2 saturations>88%;Exhibits proper breathing techniques, such as pursed lip breathing or other method taught during program session  Verbalizes importance of monitoring SPO2 with pulse oximeter and return demonstration;Maintenance of O2 saturations>88%;Exhibits proper breathing techniques, such as pursed lip breathing or other method taught during program session  Verbalizes importance of monitoring SPO2 with pulse oximeter and return demonstration;Maintenance of O2 saturations>88%;Exhibits proper breathing techniques, such as pursed lip breathing or other method taught during program session  Verbalizes importance of monitoring SPO2 with pulse oximeter and return demonstration;Maintenance of O2 saturations>88%;Exhibits proper breathing techniques, such as pursed lip breathing or other method taught  during program session  Verbalizes importance of monitoring SPO2 with pulse oximeter and return demonstration;Maintenance of O2 saturations>88%;Exhibits proper breathing techniques, such as pursed lip breathing or other method taught during program session   Comments  Patient demonstrates proper use of pulse oximeter with return demonstration and she verbalizes the importance of maintaining O2 saturations >88%. She also demonstrates proper pursed lip breathing during the program.   Patient demonstrates proper use of pulse oximeter with return demonstration  and she verbalizes the importance of maintaining O2 saturations >88%. She also demonstrates proper pursed lip breathing during the program.   Patient demonstrates proper use of pulse oximeter with return demonstration and she verbalizes the importance of maintaining O2 saturations >88%. She also demonstrates proper pursed lip breathing during the program.   Patient demonstrates proper use of pulse oximeter with return demonstration and she verbalizes the importance of maintaining O2 saturations >88%. She also demonstrates proper pursed lip breathing during the program.   Patient demonstrates proper use of pulse oximeter with return demonstration and she verbalizes the importance of maintaining O2 saturations >88%. She also demonstrates proper pursed lip breathing during the program.    Goals/Expected Outcomes  Patient will continues to meet her short and long term goals.   Patient will continues to meet her short and long term goals.   Patient will continues to meet her short and long term goals.   Patient will continues to meet her short and long term goals.   Patient will continues to meet her short and long term goals.       Oxygen Discharge (Final Oxygen Re-Evaluation): Oxygen Re-Evaluation - 11/05/17 1501      Program Oxygen Prescription   Program Oxygen Prescription  None      Home Oxygen   Home Oxygen Device  None    Sleep Oxygen Prescription  None    Home Exercise Oxygen Prescription  None    Home at Rest Exercise Oxygen Prescription  None      Goals/Expected Outcomes   Short Term Goals  To learn and understand importance of monitoring SPO2 with pulse oximeter and demonstrate accurate use of the pulse oximeter.;To learn and understand importance of maintaining oxygen saturations>88%;To learn and demonstrate proper pursed lip breathing techniques or other breathing techniques.    Long  Term Goals  Verbalizes importance of monitoring SPO2 with pulse oximeter and return  demonstration;Maintenance of O2 saturations>88%;Exhibits proper breathing techniques, such as pursed lip breathing or other method taught during program session    Comments  Patient demonstrates proper use of pulse oximeter with return demonstration and she verbalizes the importance of maintaining O2 saturations >88%. She also demonstrates proper pursed lip breathing during the program.     Goals/Expected Outcomes  Patient will continues to meet her short and long term goals.        Initial Exercise Prescription: Initial Exercise Prescription - 07/01/17 1300      Date of Initial Exercise RX and Referring Provider   Date  07/01/17    Referring Provider  Dr. Chase Caller       NuStep   Level  2    SPM  66    Minutes  15    METs  1.4      Arm Ergometer   Level  1.2    Watts  12    RPM  12    Minutes  20    METs  1.9      Prescription Details   Frequency (times  per week)  3    Duration  Progress to 30 minutes of continuous aerobic without signs/symptoms of physical distress      Intensity   THRR 40-80% of Max Heartrate  [phone]    Ratings of Perceived Exertion  11-13    Perceived Dyspnea  0-4      Progression   Progression  Continue progressive overload as per policy without signs/symptoms or physical distress.      Resistance Training   Training Prescription  Yes    Weight  1    Reps  10-15       Perform Capillary Blood Glucose checks as needed.  Exercise Prescription Changes:  Exercise Prescription Changes    Row Name 07/17/17 0700 07/25/17 1400 08/20/17 1500 09/02/17 1000 09/15/17 1400     Response to Exercise   Blood Pressure (Admit)  120/62  116/50  118/58  112/64  128/74   Blood Pressure (Exercise)  180/80  180/64  170/62  140/72  160/58   Blood Pressure (Exit)  148/82  100/50  130/70  148/70  112/54   Heart Rate (Admit)  78 bpm  80 bpm  80 bpm  63 bpm  78 bpm   Heart Rate (Exercise)  94 bpm  95 bpm  96 bpm  94 bpm  87 bpm   Heart Rate (Exit)  69 bpm  77  bpm  74 bpm  72 bpm  87 bpm   Oxygen Saturation (Admit)  98 %  98 %  96 %  97 %  96 %   Oxygen Saturation (Exercise)  93 %  97 %  97 %  97 %  98 %   Oxygen Saturation (Exit)  96 %  96 %  97 %  96 %  97 %   Rating of Perceived Exertion (Exercise)  '13  13  13  13  13   ' Perceived Dyspnea (Exercise)  '13  13  13  13  13   ' Duration  Progress to 30 minutes of  aerobic without signs/symptoms of physical distress  Progress to 30 minutes of  aerobic without signs/symptoms of physical distress  Progress to 30 minutes of  aerobic without signs/symptoms of physical distress  Progress to 30 minutes of  aerobic without signs/symptoms of physical distress  Progress to 30 minutes of  aerobic without signs/symptoms of physical distress   Intensity  THRR New [phone]  THRR New [phone]  THRR unchanged [phone]  THRR unchanged 98-115-133  THRR unchanged [phone]     Progression   Progression  Continue to progress workloads to maintain intensity without signs/symptoms of physical distress.  Continue to progress workloads to maintain intensity without signs/symptoms of physical distress.  Continue to progress workloads to maintain intensity without signs/symptoms of physical distress.  Continue to progress workloads to maintain intensity without signs/symptoms of physical distress.  Continue to progress workloads to maintain intensity without signs/symptoms of physical distress.     Resistance Training   Training Prescription  Yes  Yes  Yes  Yes  Yes   Weight  '1  1  1  1  1   ' Reps  10-15  10-15  10-15  10-15  10-15     NuStep   Level  '2  2  2  2  2   ' SPM  81  97  120  117  134   Minutes  '20  20  20  20  30   ' METs  1.7  1.7  1.8  1.8  2     Arm Ergometer   Level  1  1.3  1.4  1.5  -   Watts  '1  3  14  15  ' -   RPM  '12  12  12  12  ' -   Minutes  '15  15  15  15  ' -   METs  1  1.3  2.3  2.5  -     Home Exercise Plan   Plans to continue exercise at  Home (comment)  Home (comment)  Home (comment)  Home  (comment)  Home (comment)   Frequency  Add 2 additional days to program exercise sessions.  Add 2 additional days to program exercise sessions.  Add 2 additional days to program exercise sessions.  Add 2 additional days to program exercise sessions.  Add 2 additional days to program exercise sessions.   Initial Home Exercises Provided  07/15/17  07/15/17  07/15/17  07/15/17  07/15/17   Row Name 09/29/17 1400 10/14/17 1500 10/28/17 0800         Response to Exercise   Blood Pressure (Admit)  130/72  138/60  112/60     Blood Pressure (Exercise)  134/50  190/88  146/70     Blood Pressure (Exit)  100/50  158/80  140/60     Heart Rate (Admit)  80 bpm  70 bpm  73 bpm     Heart Rate (Exercise)  84 bpm  91 bpm  83 bpm     Heart Rate (Exit)  66 bpm  71 bpm  81 bpm     Oxygen Saturation (Admit)  97 %  95 %  97 %     Oxygen Saturation (Exercise)  98 %  96 %  98 %     Oxygen Saturation (Exit)  99 %  97 %  97 %     Rating of Perceived Exertion (Exercise)  '11  13  11     ' Perceived Dyspnea (Exercise)  '11  13  11     ' Duration  Progress to 30 minutes of  aerobic without signs/symptoms of physical distress  Progress to 30 minutes of  aerobic without signs/symptoms of physical distress  Progress to 30 minutes of  aerobic without signs/symptoms of physical distress     Intensity  THRR New [phone]  THRR New 102-117-134  THRR New [phone]       Progression   Progression  Continue to progress workloads to maintain intensity without signs/symptoms of physical distress.  Continue to progress workloads to maintain intensity without signs/symptoms of physical distress.  Continue to progress workloads to maintain intensity without signs/symptoms of physical distress.       Resistance Training   Training Prescription  Yes  Yes  Yes     Weight  '1  2  2     ' Reps  10-15  10-15  10-15       NuStep   Level  '3  3  3     ' SPM  116  100  94     Minutes  '30  15  15     ' METs  1.8  1.9  2       Arm Ergometer    Level  -  1.7  1.7     Watts  -  16  26     RPM  -  16  16     Minutes  -  20  20     METs  -  2.8  3       Home Exercise Plan   Plans to continue exercise at  Home (comment)  Home (comment)  Home (comment)     Frequency  Add 2 additional days to program exercise sessions.  Add 2 additional days to program exercise sessions.  Add 2 additional days to program exercise sessions.     Initial Home Exercises Provided  07/15/17  07/15/17  07/15/17        Exercise Comments:  Exercise Comments    Row Name 07/17/17 0751 07/25/17 1429 08/20/17 1504 09/02/17 1013 09/15/17 1453   Exercise Comments  Patient has just started PR and will slowly be progressed in time.   Patient is doing very well in PR even on only her 5th session she has progressed on the arm ergometer to level 1.3. Patient has not been in the program long enough to see progress however she will continued to be monitored.   Patient is doing very well in PR she has increased her SPMs on the nustep and her level on the arm ergometer and she has gottn stronger since the program began. Patient enjoys the time with friends that she has made.   Patient continues to do well in PR. Patient has increased her level on the arm ergometer and has been keeping her SPMs high on the nustep machine. Patient continues to exercise with little to no SOB.   Patient continues to do well in PR. Patient has progressed in [facility] on the nustep and is doing well on the arm ergometer when she can use it due to our broken machine and busy class. Patient has stated to me that she has increased strength and stamina since beginnin the program and that she truly enjoys it   [place] Name 09/29/17 1426 10/14/17 1538 10/28/17 0805       Exercise Comments  Patient is doing well in PR. Patient has maintained her weights for her dumbbells at the beginning of the workout and has increased her level on the nustep machine. Patient states that she feels stronger since joining the program.    Patient is doing well in PR. Patient has increased her level on the arm ergometer and has maintained her level on the nustep machine. patient has also increased the size of her dumbbells to 2lbs.   Patient has been doing very well in PR. Patient has maintained her level on the machines that she is on and has increased her SPMs on both machines as well as the METs.         Exercise Goals and Review:  Exercise Goals    Row Name 07/01/17 1313             Exercise Goals   Increase Physical Activity  Yes       Intervention  Provide advice, education, support and counseling about physical activity/exercise needs.;Develop an individualized exercise prescription for aerobic and resistive training based on initial evaluation findings, risk stratification, comorbidities and participant's personal goals.       Expected Outcomes  Short Term: Attend rehab on a regular basis to increase amount of physical activity.       Increase Strength and Stamina  Yes       Intervention  Develop an individualized exercise prescription for aerobic and resistive training based on initial evaluation findings, risk stratification, comorbidities and participant's personal goals.;Provide advice, education, support and counseling about physical activity/exercise needs.  Expected Outcomes  Short Term: Increase workloads from initial exercise prescription for resistance, speed, and METs.       Able to understand and use rate of perceived exertion (RPE) scale  Yes       Intervention  Provide education and explanation on how to use RPE scale       Expected Outcomes  Short Term: Able to use RPE daily in rehab to express subjective intensity level;Long Term:  Able to use RPE to guide intensity level when exercising independently       Able to understand and use Dyspnea scale  Yes       Intervention  Provide education and explanation on how to use Dyspnea scale       Expected Outcomes  Short Term: Able to use Dyspnea scale  daily in rehab to express subjective sense of shortness of breath during exertion;Long Term: Able to use Dyspnea scale to guide intensity level when exercising independently       Knowledge and understanding of Target Heart Rate Range (THRR)  Yes       Intervention  Provide education and explanation of THRR including how the numbers were predicted and where they are located for reference       Expected Outcomes  Short Term: Able to state/look up THRR;Long Term: Able to use THRR to govern intensity when exercising independently;Short Term: Able to use daily as guideline for intensity in rehab       Able to check pulse independently  Yes       Intervention  Provide education and demonstration on how to check pulse in carotid and radial arteries.;Review the importance of being able to check your own pulse for safety during independent exercise       Expected Outcomes  Short Term: Able to explain why pulse checking is important during independent exercise;Long Term: Able to check pulse independently and accurately       Understanding of Exercise Prescription  Yes       Intervention  Provide education, explanation, and written materials on patient's individual exercise prescription       Expected Outcomes  Short Term: Able to explain program exercise prescription;Long Term: Able to explain home exercise prescription to exercise independently          Exercise Goals Re-Evaluation : Exercise Goals Re-Evaluation    Row Name 07/25/17 1428 08/25/17 1454 09/15/17 1452 10/07/17 0814 11/03/17 1455     Exercise Goal Re-Evaluation   Exercise Goals Review  Increase Physical Activity;Increase Strength and Stamina;Able to understand and use Dyspnea scale;Knowledge and understanding of Target Heart Rate Range (THRR);Able to understand and use rate of perceived exertion (RPE) scale;Able to check pulse independently  Increase Physical Activity;Increase Strength and Stamina;Able to understand and use Dyspnea  scale;Knowledge and understanding of Target Heart Rate Range (THRR);Able to understand and use rate of perceived exertion (RPE) scale;Able to check pulse independently  Increase Physical Activity;Increase Strength and Stamina;Able to understand and use Dyspnea scale;Knowledge and understanding of Target Heart Rate Range (THRR);Able to understand and use rate of perceived exertion (RPE) scale;Able to check pulse independently  Increase Physical Activity;Increase Strength and Stamina;Able to understand and use Dyspnea scale;Knowledge and understanding of Target Heart Rate Range (THRR);Able to understand and use rate of perceived exertion (RPE) scale;Able to check pulse independently  Increase Physical Activity;Increase Strength and Stamina;Able to understand and use Dyspnea scale;Knowledge and understanding of Target Heart Rate Range (THRR);Able to understand and use rate of perceived exertion (RPE) scale;Able  to check pulse independently   Comments  Patient is doing very well in PR even on only her 5th session she has progressed on the arm ergometer to level 1.3. Patient has not been in the program long enough to see progress however she will continued to be monitored.   Patient is doing well in PR and has maintained her levels on the two machines the arm ergometer and the nustep machine. Patient seems to be enjoying the program and looks to have gained some mobility from the program. Patient will continued to be monitored throughout the remainder of the program.   Patient continues to do well in PR. Patient has progressed in [facility] on the nustep and is doing well on the arm ergometer when she can use it due to our broken machine and busy class. Patient has stated to me that she has increased strength and stamina since beginnin the program and that she truly enjoys it  Patient continues to do better in CR. patiet has increased her level on the nustep to level 3 since last ITP. Patient has stated to me that she has  better mobility and she can do much more around the house and at her local church without as much SOB.  Patient has been doing very well in PR. Patient has stated to me that she has gotten betetr mobility from the program and that she is being active outside of class with church activities.    Expected Outcomes  Patient wishes to have better mobility and to lose weight.   Patient wishes to have better mobility and to lose weight.   Patient wishes to have better mobility and to lose weight.   Patient wishes to have better mobility and to lose weight.   Patient wishes to have better mobility and to lose weight.       Discharge Exercise Prescription (Final Exercise Prescription Changes): Exercise Prescription Changes - 10/28/17 0800      Response to Exercise   Blood Pressure (Admit)  112/60    Blood Pressure (Exercise)  146/70    Blood Pressure (Exit)  140/60    Heart Rate (Admit)  73 bpm    Heart Rate (Exercise)  83 bpm    Heart Rate (Exit)  81 bpm    Oxygen Saturation (Admit)  97 %    Oxygen Saturation (Exercise)  98 %    Oxygen Saturation (Exit)  97 %    Rating of Perceived Exertion (Exercise)  11    Perceived Dyspnea (Exercise)  11    Duration  Progress to 30 minutes of  aerobic without signs/symptoms of physical distress    Intensity  THRR New [phone]      Progression   Progression  Continue to progress workloads to maintain intensity without signs/symptoms of physical distress.      Resistance Training   Training Prescription  Yes    Weight  2    Reps  10-15      NuStep   Level  3    SPM  94    Minutes  15    METs  2      Arm Ergometer   Level  1.7    Watts  26    RPM  16    Minutes  20    METs  3      Home Exercise Plan   Plans to continue exercise at  Home (comment)    Frequency  Add 2 additional days to  program exercise sessions.    Initial Home Exercises Provided  07/15/17       Nutrition:  Target Goals: Understanding of nutrition guidelines, daily  intake of sodium <1550m, cholesterol <2064m calories 30% from fat and 7% or less from saturated fats, daily to have 5 or more servings of fruits and vegetables.  Biometrics: Pre Biometrics - 07/01/17 1313      Pre Biometrics   Height  '5\' 5"'  (1.651 m)    Weight  214 lb 1.6 oz (97.1 kg)    Waist Circumference  48.5 inches    Hip Circumference  47 inches    Waist to Hip Ratio  1.03 %    BMI (Calculated)  35.63    Triceps Skinfold  26 mm    % Body Fat  47.6 %    Grip Strength  28.93 kg    Flexibility  0 in    Single Leg Stand  0 seconds        Nutrition Therapy Plan and Nutrition Goals: Nutrition Therapy & Goals - 07/31/17 1526      Personal Nutrition Goals   Nutrition Goal  For heart healthy choices add >50% of whole grains, make half their plate fruits and vegetables. Discuss the difference between starchy vegetables and leafy greens, and how leafy vegetables provide fiber, helps maintain healthy weight, helps control blood glucose, and lowers cholesterol.  Discuss purchasing fresh or frozen vegetable to reduce sodium and not to add grease, fat or sugar. Consume <18oz of red meat per week. Consume lean cuts of meats and very little of meats high in sodium and nitrates such as pork and lunch meats. Discussed portion control for all food groups.      Additional Goals?  No    Comments  Patient met with RD 07/31/17.      Intervention Plan   Intervention  Nutrition handout(s) given to patient.    Expected Outcomes  Short Term Goal: A plan has been developed with personal nutrition goals set during dietitian appointment.;Long Term Goal: Adherence to prescribed nutrition plan.       Nutrition Assessments: Nutrition Assessments - 07/01/17 1600      MEDFICTS Scores   Pre Score  62       Nutrition Goals Re-Evaluation: Nutrition Goals Re-Evaluation    Row Name 08/28/17 1518 09/18/17 1501 10/10/17 1349 11/05/17 1502       Goals   Current Weight  208 lb 8 oz (94.6 kg)  211 lb 4.8 oz  (95.8 kg)  215 lb 6.4 oz (97.7 kg)  211 lb 12.8 oz (96.1 kg)    Nutrition Goal  For heart healthy choices add >50% of whole grains, make half their plate fruits and vegetables. Discuss the difference between starchy vegetables and leafy greens, and how leafy vegetables provide fiber, helps maintain healthy weight, helps control blood glucose, and lowers cholesterol.  Discuss purchasing fresh or frozen vegetable to reduce sodium and not to add grease, fat or sugar. Consume <18oz of red meat per week. Consume lean cuts of meats and very little of meats high in sodium and nitrates such as pork and lunch meats. Discussed portion control for all food groups.    For heart healthy choices add >50% of whole grains, make half their plate fruits and vegetables. Discuss the difference between starchy vegetables and leafy greens, and how leafy vegetables provide fiber, helps maintain healthy weight, helps control blood glucose, and lowers cholesterol.  Discuss purchasing fresh or frozen vegetable to  reduce sodium and not to add grease, fat or sugar. Consume <18oz of red meat per week. Consume lean cuts of meats and very little of meats high in sodium and nitrates such as pork and lunch meats. Discussed portion control for all food groups.    For heart healthy choices add >50% of whole grains, make half their plate fruits and vegetables. Discuss the difference between starchy vegetables and leafy greens, and how leafy vegetables provide fiber, helps maintain healthy weight, helps control blood glucose, and lowers cholesterol.  Discuss purchasing fresh or frozen vegetable to reduce sodium and not to add grease, fat or sugar. Consume <18oz of red meat per week. Consume lean cuts of meats and very little of meats high in sodium and nitrates such as pork and lunch meats. Discussed portion control for all food groups.    For heart healthy choices add >50% of whole grains, make half their plate fruits and vegetables. Discuss the  difference between starchy vegetables and leafy greens, and how leafy vegetables provide fiber, helps maintain healthy weight, helps control blood glucose, and lowers cholesterol.  Discuss purchasing fresh or frozen vegetable to reduce sodium and not to add grease, fat or sugar. Consume <18oz of red meat per week. Consume lean cuts of meats and very little of meats high in sodium and nitrates such as pork and lunch meats. Discussed portion control for all food groups.      Comment  Patient has lost 5.6 lbs since last 30 day review. 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 Will continue to monitor for progress.   Patient has gained 3 lbs since last 30 day review. She continues to follow a regular diet. Will continue to monitor for progress.   Patient has gained 4 lbs since last 30 day review. She continues to follow a regular diet. Will continue to monitor for progress.   Patient has gained 4 lbs since last 30 day review. She says she continues to follow a regular diet. Will continue to monitor for progress.     Expected Outcome  Patient will continue to lose weight.   Patient will continue to lose weight.   Patient will continue to lose weight.   Patient will continue meet her nutritional goals.       Personal Goal #2 Re-Evaluation   Personal Goal #2  Patient eats a regular diet.   Patient eats a regular diet.   Patient eats a regular diet.   Patient eats a regular diet.        Nutrition Goals Discharge (Final Nutrition Goals Re-Evaluation): Nutrition Goals Re-Evaluation - 11/05/17 1502      Goals   Current Weight  211 lb 12.8 oz (96.1 kg)    Nutrition Goal  For heart healthy choices add >50% of whole grains, make half their plate fruits and vegetables. Discuss the difference between starchy vegetables and leafy greens, and how leafy vegetables provide fiber, helps maintain healthy weight, helps control blood glucose, and lowers cholesterol.  Discuss purchasing fresh or frozen vegetable to reduce sodium and  not to add grease, fat or sugar. Consume <18oz of red meat per week. Consume lean cuts of meats and very little of meats high in sodium and nitrates such as pork and lunch meats. Discussed portion control for all food groups.      Comment  Patient has gained 4 lbs since last 30 day review. She says she continues to follow a regular diet. Will continue to monitor for progress.  Expected Outcome  Patient will continue meet her nutritional goals.       Personal Goal #2 Re-Evaluation   Personal Goal #2  Patient eats a regular diet.        Psychosocial: Target Goals: Acknowledge presence or absence of significant depression and/or stress, maximize coping skills, provide positive support system. Participant is able to verbalize types and ability to use techniques and skills needed for reducing stress and depression.  Initial Review & Psychosocial Screening: Initial Psych Review & Screening - 07/01/17 1603      Initial Review   Current issues with  Current Depression;Current Sleep Concerns      Family Dynamics   Good Support System?  Yes      Barriers   Psychosocial barriers to participate in program  Psychosocial barriers identified (see note) QOL score indicated depression at 15.56 overall.      Screening Interventions   Interventions  Encouraged to exercise;Provide feedback about the scores to participant    Expected Outcomes  Short Term goal: Identification and review with participant of any Quality of Life or Depression concerns found by scoring the questionnaire.;Long Term goal: The participant improves quality of Life and PHQ9 Scores as seen by post scores and/or verbalization of changes       Quality of Life Scores: Quality of Life - 07/01/17 1314      Quality of Life Scores   Health/Function Pre  13.27 %    Socioeconomic Pre  11.33 %    Psych/Spiritual Pre  18.07 %    Family Pre  24 %    GLOBAL Pre  15.56 %      Scores of 19 and below usually indicate a poorer quality of  life in these areas.  A difference of  2-3 points is a clinically meaningful difference.  A difference of 2-3 points in the total score of the Quality of Life Index has been associated with significant improvement in overall quality of life, self-image, physical symptoms, and general health in studies assessing change in quality of life.   PHQ-9: Recent Review Flowsheet Data    Depression screen [facility] 2/9 07/01/2017   Decreased Interest 0   Down, Depressed, Hopeless 0   PHQ - 2 Score 0   Altered sleeping 2   Tired, decreased energy 3   Change in appetite 1   Feeling bad or failure about yourself  0   Trouble concentrating 0   Moving slowly or fidgety/restless 0   Suicidal thoughts 0   PHQ-9 Score 6   Difficult doing work/chores Somewhat difficult     Interpretation of Total Score  Total Score Depression Severity:  1-4 = Minimal depression, 5-9 = Mild depression, 10-14 = Moderate depression, 15-19 = Moderately severe depression, 20-27 = Severe depression   Psychosocial Evaluation and Intervention: Psychosocial Evaluation - 07/01/17 1604      Psychosocial Evaluation & Interventions   Interventions  Encouraged to exercise with the program and follow exercise prescription    Comments  Patient does not want to be referred to a therapist.     Continue Psychosocial Services   Follow up required by staff       Psychosocial Re-Evaluation: Psychosocial Re-Evaluation    [place] Name 07/31/17 1342 08/28/17 1523 09/18/17 1505 10/10/17 1352 11/05/17 1505     Psychosocial Re-Evaluation   Current issues with  Current Depression;Current Sleep Concerns  Current Depression;Current Sleep Concerns  Current Depression;Current Sleep Concerns  Current Depression;Current Sleep Concerns  Current Depression;Current Sleep  Concerns   Comments  Patient's initial QOL score was 15.56 and her PHQ-9 score was 6 indicating depression. She says her does not feel she needs treatment at this time.   Patient's initial QOL  score was 15.56 and her PHQ-9 score was 6 indicating depression. She says her does not feel she needs treatment at this time.   Patient's initial QOL score was 15.56 and her PHQ-9 score was 6 indicating depression. She says her does not feel she needs treatment at this time. Still reports difficulty sleeping.   Patient's initial QOL score was 15.56 and her PHQ-9 score was 6 indicating depression. She says she does not feel she needs treatment at this time. Still reports difficulty sleeping.   Patient's initial QOL score was 15.56 and her PHQ-9 score was 6 indicating depression. She says she does not feel she needs treatment at this time. Still reports difficulty sleeping.    Expected Outcomes  Patient's QOL and PHQ-9 scores will improve or remain the same at discharge and she will have no additional psychosocial issues identified.   Patient's QOL and PHQ-9 scores will improve or remain the same at discharge and she will have no additional psychosocial issues identified.   Patient's QOL and PHQ-9 scores will improve or remain the same at discharge and she will have no additional psychosocial issues identified.   Patient's QOL and PHQ-9 scores will improve or remain the same at discharge and she will have no additional psychosocial issues identified.   Patient's QOL and PHQ-9 scores will improve or remain the same at discharge and she will have no additional psychosocial issues identified.    Interventions  Stress management education;Encouraged to attend Cardiac Rehabilitation for the exercise;Relaxation education  Stress management education;Encouraged to attend Cardiac Rehabilitation for the exercise;Relaxation education  Stress management education;Encouraged to attend Cardiac Rehabilitation for the exercise;Relaxation education  Stress management education;Encouraged to attend Cardiac Rehabilitation for the exercise;Relaxation education  Stress management education;Encouraged to attend Cardiac Rehabilitation  for the exercise;Relaxation education   Continue Psychosocial Services   No Follow up required  No Follow up required  No Follow up required  No Follow up required  No Follow up required      Psychosocial Discharge (Final Psychosocial Re-Evaluation): Psychosocial Re-Evaluation - 11/05/17 1505      Psychosocial Re-Evaluation   Current issues with  Current Depression;Current Sleep Concerns    Comments  Patient's initial QOL score was 15.56 and her PHQ-9 score was 6 indicating depression. She says she does not feel she needs treatment at this time. Still reports difficulty sleeping.     Expected Outcomes  Patient's QOL and PHQ-9 scores will improve or remain the same at discharge and she will have no additional psychosocial issues identified.     Interventions  Stress management education;Encouraged to attend Cardiac Rehabilitation for the exercise;Relaxation education    Continue Psychosocial Services   No Follow up required        Education: Education Goals: Education classes will be provided on a weekly basis, covering required topics. Participant will state understanding/return demonstration of topics presented.  Learning Barriers/Preferences: Learning Barriers/Preferences - 07/01/17 1518      Learning Barriers/Preferences   Learning Barriers  None    Learning Preferences  Verbal Instruction;Skilled Demonstration;Group Instruction       Education Topics: How Lungs Work and Diseases: - Discuss the anatomy of the lungs and diseases that can affect the lungs, such as COPD.   PULMONARY REHAB OTHER RESPIRATORY from 10/16/2017  in [place]  Date  09/04/17  Educator  Etheleen Mayhew  Instruction Review Code  2- Demonstrated Understanding      Exercise: -Discuss the importance of exercise, FITT principles of exercise, normal and abnormal responses to exercise, and how to exercise safely.   PULMONARY REHAB OTHER RESPIRATORY from 10/16/2017 in [place]  Date  08/28/17  Educator  DC  Instruction Review Code  2- Demonstrated Understanding      Environmental Irritants: -Discuss types of environmental irritants and how to limit exposure to environmental irritants.   PULMONARY REHAB OTHER RESPIRATORY from 10/16/2017 in [place]  Date  09/11/17  Educator  DJ  Instruction Review Code  2- Demonstrated Understanding      Meds/Inhalers and oxygen: - Discuss respiratory medications, definition of an inhaler and oxygen, and the proper way to use an inhaler and oxygen.   PULMONARY REHAB OTHER RESPIRATORY from 10/16/2017 in [place]  Date  09/18/17  Educator  DC      Energy Saving Techniques: - Discuss methods to conserve energy and decrease shortness of breath when performing activities of daily living.    PULMONARY REHAB OTHER RESPIRATORY from 10/16/2017 in [place]  Date  09/25/17  Educator  DC  Instruction Review Code  2- Demonstrated Understanding      Bronchial Hygiene / Breathing Techniques: - Discuss breathing mechanics, pursed-lip breathing technique,  proper posture, effective ways to clear airways, and other functional breathing techniques   PULMONARY REHAB OTHER RESPIRATORY from 10/16/2017 in [place]  Date  10/02/17  Educator  DJ  Instruction Review Code  2- Demonstrated Understanding      Cleaning Equipment: - Provides group verbal and written instruction about the health risks of elevated stress, cause of high stress, and healthy ways to reduce stress.   Nutrition I: Fats: - Discuss the types of cholesterol, what cholesterol does to the body, and how cholesterol levels can be controlled.   PULMONARY REHAB OTHER RESPIRATORY from 10/16/2017 in [place]  Date  07/17/17  Educator  DJ  Instruction Review Code  2- Demonstrated Understanding      Nutrition II: Labels: -Discuss the  different components of food labels and how to read food labels.   PULMONARY REHAB OTHER RESPIRATORY from 10/16/2017 in [place]  Date  07/24/17  Educator  DJ  Instruction Review Code  2- Demonstrated Understanding      Respiratory Infections: - Discuss the signs and symptoms of respiratory infections, ways to prevent respiratory infections, and the importance of seeking medical treatment when having a respiratory infection.   PULMONARY REHAB OTHER RESPIRATORY from 10/16/2017 in [place]  Date  07/31/17  Educator  DJ  Instruction Review Code  2- Demonstrated Understanding      Stress I: Signs and Symptoms: - Discuss the causes of stress, how stress may lead to anxiety and depression, and ways to limit stress.   PULMONARY REHAB OTHER RESPIRATORY from 10/16/2017 in [place]  Date  08/07/17  Educator  DJ  Instruction Review Code  2- Demonstrated Understanding      Stress II: Relaxation: -Discuss relaxation techniques to limit stress.   PULMONARY REHAB OTHER RESPIRATORY from 10/16/2017 in [place]  Date  08/14/17  Educator  DJ  Instruction Review Code  2- Demonstrated Understanding      Oxygen for Home/Travel: - Discuss how to  prepare for travel when on oxygen and proper ways to transport and store oxygen to ensure safety.   PULMONARY REHAB OTHER RESPIRATORY from 10/16/2017 in [place]  Date  08/21/17  Educator  DC  Instruction Review Code  2- Demonstrated Understanding      Knowledge Questionnaire Score: Knowledge Questionnaire Score - 07/01/17 1539      Knowledge Questionnaire Score   Pre Score  15/18       Core Components/Risk Factors/Patient Goals at Admission: Personal Goals and Risk Factors at Admission - 07/01/17 1600      Core Components/Risk Factors/Patient Goals on Admission    Weight Management  Yes    Intervention  Weight  Management/Obesity: Establish reasonable short term and long term weight goals.;Obesity: Provide education and appropriate resources to help participant work on and attain dietary goals.    Admit Weight  214 lb 1.6 oz (97.1 kg)    Goal Weight: Short Term  204 lb 1.6 oz (92.6 kg)    Goal Weight: Long Term  194 lb 1.6 oz (88 kg)    Expected Outcomes  Short Term: Continue to assess and modify interventions until short term weight is achieved;Long Term: Adherence to nutrition and physical activity/exercise program aimed toward attainment of established weight goal    Personal Goal Other  Yes    Personal Goal  Better mobility, lose 25-30lbs    Intervention  Attend PR 2 x week and supplement at home exercise 3 x week.    Expected Outcomes  Reach personal goals.       Core Components/Risk Factors/Patient Goals Review:  Goals and Risk Factor Review    Row Name 07/31/17 1337 08/28/17 1521 09/18/17 1502 10/10/17 1349 11/05/17 1503     Core Components/Risk Factors/Patient Goals Review   Personal Goals Review  Weight Management/Obesity;Increase knowledge of respiratory medications and ability to use respiratory devices properly.;Improve shortness of breath with ADL's Better mobility; lose weight 25-30 lbs;  Do yard work.   Weight Management/Obesity;Increase knowledge of respiratory medications and ability to use respiratory devices properly.;Improve shortness of breath with ADL's Better mobility; lose weight 25-30 lbs. Do yard work again.   Weight Management/Obesity;Increase knowledge of respiratory medications and ability to use respiratory devices properly.;Improve shortness of breath with ADL's Better mobility; lose weight 25-30 lbs; Do yard work again.   Weight Management/Obesity;Increase knowledge of respiratory medications and ability to use respiratory devices properly.;Improve shortness of breath with ADL's Better mobility; lose weight 25-30 lbs; do yard work again.   Weight Management/Obesity;Increase  knowledge of respiratory medications and ability to use respiratory devices properly.;Improve shortness of breath with ADL's Better mobility; lose weight 25-30 lbs; Do yard work again.    Review  Patient has completed 7 sessions losing 3.5 lbs. She is doing well in the program with some progression. She says it is too soon to tell if the program is making a difference. She hopes to meet her goals as she continues the program. Will continue to monitor for progress.   Patient has completed 15 sessions losing 4 lbs. She continues to do well in the program with progression. She says the program is helping her. She feels stronger and has more endurance to do things. She says she feels like she could do yard work but is taking a medication that does no allow to get out in the sun or sweat excessively. She is afraid to try. She is very pleased with her progress. Will continue to monitor for progress.  Patient has completed 21 sessions gaining 3 lbs since last 30 day review. She continues to do well in the program with progression. She continues to say the program is helping her. She says she has more endurance and more energy to do things. She says she feels like she has gained some strength since last 30 day review. Will continue to monitor for progress.   Patient has completed 27 sessions gaining 4 lbs since last 30 day review. She blames weight gain on fluid. She continues to do well in the program with progression. She says she is continuing to get stronger and has more stamina. She says she feels she has gained more mobility in her upper extremtities and feels her balance has improved with better walking. She is very pleased with her progress. Will continue to monitor.   Patient has completed 33 sessions gaining 4 lbs since last 30 day review. She continues to do well in the program with progression. She says she continues to feel stronger with increased energy. She does feel more flexible in her upper extremtities  and feels she is walking better with improved balance. Will continue to monitor for progress.    Expected Outcomes  Patient will continue to attend sessions and complete the program.   Patient will continue to attend sessions and complete the program.   Patient will continue to attend sessions and complete the program.   Patient will continue to attend sessions and complete the program.   Patient will continue to attend sessions and complete the program.       Core Components/Risk Factors/Patient Goals at Discharge (Final Review):  Goals and Risk Factor Review - 11/05/17 1503      Core Components/Risk Factors/Patient Goals Review   Personal Goals Review  Weight Management/Obesity;Increase knowledge of respiratory medications and ability to use respiratory devices properly.;Improve shortness of breath with ADL's Better mobility; lose weight 25-30 lbs; Do yard work again.     Review  Patient has completed 33 sessions gaining 4 lbs since last 30 day review. She continues to do well in the program with progression. She says she continues to feel stronger with increased energy. She does feel more flexible in her upper extremtities and feels she is walking better with improved balance. Will continue to monitor for progress.     Expected Outcomes  Patient will continue to attend sessions and complete the program.        ITP Comments: ITP Comments    Row Name 07/01/17 1543 07/03/17 1336         ITP Comments  Mrs. 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 She has also been diagnosed with Myasthenia Gravis She is depressed according to her QOL scores. We will continue to address this area as she comes through our program.   Patient new to program. Plans to start Tuesday 07/08/17. Will continue to monitor for progress.          Comments: ITP 30 Day REVIEW Pt is making expected progress toward pulmonary rehab goals after completing 33 sessions. Recommend continued exercise, life style  modification, education, and utilization of breathing techniques to increase stamina and strength and decrease shortness of breath with exertion.

## 2017-11-06 ENCOUNTER — Encounter (HOSPITAL_COMMUNITY)
Admission: RE | Admit: 2017-11-06 | Discharge: 2017-11-06 | Disposition: A | Discharge status: Home / Self Care | Payer: PPO | Source: Ambulatory Visit | Attending: Internal Medicine | Admitting: Internal Medicine

## 2017-11-06 DIAGNOSIS — J849 Interstitial pulmonary disease, unspecified: Secondary | ICD-10-CM | POA: Diagnosis not present

## 2017-11-06 NOTE — Progress Notes (Signed)
Daily Session Note  Patient Details  Name: [name] MRN: [id number] Date of Birth: [birth year] Referring Provider:     PULMONARY REHAB OTHER RESP ORIENTATION from 07/01/2017 in [place]  Referring Provider  Dr. Chase Caller       Encounter Date: 11/06/2017  Check In: Session Check In - 11/06/17 1045      Check-In   Location  [facility]    Staff Present  Diane Angelina Pih, MS, EP, [facility], Exercise Physiologist;Gregory Luther Parody, BS, EP, Exercise Physiologist;Kaleo Condrey Wynetta Emery, RN, BSN    Supervising physician immediately available to respond to emergencies  See telemetry face sheet for immediately available MD    Medication changes reported      No    Fall or balance concerns reported     Yes    Comments  Has fallen twice in the past 12 months. She also fell 09/24/17.     Warm-up and Cool-down  Performed as group-led Higher education careers adviser Performed  Yes    VAD Patient?  No    PAD/SET Patient?  No      Pain Assessment   Currently in Pain?  No/denies    Pain Score  0-No pain    Multiple Pain Sites  No       Capillary Blood Glucose: No results found for this or any previous visit (from the past 24 hour(s)).    Social History   Tobacco Use  Smoking Status Never Smoker  Smokeless Tobacco Never Used    Goals Met:  Proper associated with RPD/PD & O2 Sat Independence with exercise equipment Improved SOB with ADL's Using PLB without cueing & demonstrates good technique Exercise tolerated well No report of cardiac concerns or symptoms Strength training completed today  Goals Unmet:  Not Applicable  Comments: Check out 1145.   Dr. Sinda Du is Medical Director for [facility].

## 2017-11-11 ENCOUNTER — Encounter (HOSPITAL_COMMUNITY)
Admission: RE | Admit: 2017-11-11 | Discharge: 2017-11-11 | Disposition: A | Discharge status: Home / Self Care | Payer: PPO | Source: Ambulatory Visit | Attending: Internal Medicine | Admitting: Internal Medicine

## 2017-11-11 DIAGNOSIS — J849 Interstitial pulmonary disease, unspecified: Secondary | ICD-10-CM | POA: Diagnosis not present

## 2017-11-11 NOTE — Progress Notes (Signed)
Daily Session Note  Patient Details  Name: [name] MRN: [id number] Date of Birth: 1945/05/18 Referring Provider:     PULMONARY REHAB OTHER RESP ORIENTATION from 07/01/2017 in [place]  Referring Provider  Dr. Chase Caller       Encounter Date: 11/11/2017  Check In: Session Check In - 11/11/17 1400      Check-In   Location  [facility]    Staff Present  Diane Angelina Pih, MS, EP, [facility], Exercise Physiologist;Brice Potteiger Wynetta Emery, RN, BSN    Supervising physician immediately available to respond to emergencies  See telemetry face sheet for immediately available MD    Medication changes reported      No    Fall or balance concerns reported     Yes    Comments  Has fallen twice in the past 12 months. She also fell 09/24/17.     Warm-up and Cool-down  Performed as group-led Higher education careers adviser Performed  Yes    VAD Patient?  No    PAD/SET Patient?  No      Pain Assessment   Currently in Pain?  No/denies    Pain Score  0-No pain    Multiple Pain Sites  No       Capillary Blood Glucose: No results found for this or any previous visit (from the past 24 hour(s)).    Social History   Tobacco Use  Smoking Status Never Smoker  Smokeless Tobacco Never Used    Goals Met:  Proper associated with RPD/PD & O2 Sat Independence with exercise equipment Improved SOB with ADL's Using PLB without cueing & demonstrates good technique Exercise tolerated well No report of cardiac concerns or symptoms Strength training completed today  Goals Unmet:  Not Applicable  Comments: Check out 1400.   Dr. Sinda Du is Medical Director for [facility].

## 2017-11-13 ENCOUNTER — Encounter (HOSPITAL_COMMUNITY)
Admission: RE | Admit: 2017-11-13 | Discharge: 2017-11-13 | Disposition: A | Discharge status: Home / Self Care | Payer: PPO | Source: Ambulatory Visit | Attending: Internal Medicine | Admitting: Internal Medicine

## 2017-11-13 VITALS — Ht 65.0 in | Wt 212.0 lb

## 2017-11-13 DIAGNOSIS — J849 Interstitial pulmonary disease, unspecified: Secondary | ICD-10-CM

## 2017-11-13 NOTE — Progress Notes (Signed)
Daily Session Note  Patient Details  Name: LETANYA FROH MRN: [id number] Date of Birth: [birth year] Referring Provider:     PULMONARY REHAB OTHER RESP ORIENTATION from 07/01/2017 in [place]  Referring Provider  Dr. Chase Caller       Encounter Date: 11/13/2017  Check In: Session Check In - 11/13/17 1045      Check-In   Location  [facility]    Staff Present  Diane Angelina Pih, MS, EP, [facility], Exercise Physiologist;Helon Wisinski Wynetta Emery, RN, BSN    Supervising physician immediately available to respond to emergencies  See telemetry face sheet for immediately available MD    Medication changes reported      No    Fall or balance concerns reported     Yes    Comments  Has fallen twice in the past 12 months. She also fell 09/24/17.     Warm-up and Cool-down  Performed as group-led Higher education careers adviser Performed  Yes    VAD Patient?  No    PAD/SET Patient?  No      Pain Assessment   Currently in Pain?  No/denies    Pain Score  0-No pain    Multiple Pain Sites  No       Capillary Blood Glucose: No results found for this or any previous visit (from the past 24 hour(s)).    Social History   Tobacco Use  Smoking Status Never Smoker  Smokeless Tobacco Never Used    Goals Met:  Proper associated with RPD/PD & O2 Sat Independence with exercise equipment Improved SOB with ADL's Using PLB without cueing & demonstrates good technique Exercise tolerated well No report of cardiac concerns or symptoms Strength training completed today  Goals Unmet:  Not Applicable  Comments: Check out 1145.   Dr. Sinda Du is Medical Director for [facility].

## 2017-11-18 ENCOUNTER — Encounter (HOSPITAL_COMMUNITY): Payer: PPO

## 2017-11-20 NOTE — Progress Notes (Signed)
Pulmonary Individual Treatment Plan  Patient Details  Name: [name] MRN: [id number] Date of Birth: Feb 27, 1946 Referring Provider:     PULMONARY REHAB OTHER RESP ORIENTATION from 07/01/2017 in [facility]  Referring Provider  Dr. Chase Caller       Initial Encounter Date:    [facility] from 07/01/2017 in [facility]  Date  07/01/17      Visit Diagnosis: ILD (interstitial lung disease) ([place])  Patient's Home Medications on Admission:   Current Outpatient Medications:  .  ACCU-CHEK AVIVA PLUS test strip, Apply 1 strip topically daily. for testing, Disp: , Rfl: 1 .  ACCU-CHEK SOFTCLIX LANCETS lancets, TEST DAILY E11.9, Disp: , Rfl: 1 .  amoxicillin (AMOXIL) 875 MG tablet, Take 875 mg by mouth 2 (two) times daily., Disp: , Rfl:  .  APPLE CIDER VINEGAR PO, Take by mouth daily. , Disp: , Rfl:  .  aspirin EC 81 MG tablet, Take 81 mg by mouth daily., Disp: , Rfl:  .  Calcium Carb-Cholecalciferol (CALCIUM+D3) 600-800 MG-UNIT TABS, Take 1 tablet by mouth daily., Disp: , Rfl:  .  cetirizine (ZYRTEC) 10 MG tablet, Take 10 mg by mouth daily., Disp: , Rfl: 0 .  co-enzyme Q-10 50 MG capsule, Take 100 mg by mouth daily., Disp: , Rfl:  .  [employer] NATURAL LUTEIN EYE HEALTH PO, Take 1 capsule by mouth daily. , Disp: , Rfl:  .  diclofenac sodium (VOLTAREN) 1 % GEL, Apply 1 application topically 2 (two) times daily., Disp: , Rfl:  .  fluticasone (FLONASE) 50 MCG/ACT nasal spray, Place 2 sprays into both nostrils daily. , Disp: , Rfl:  .  gabapentin (NEURONTIN) 100 MG capsule, Take 100 mg by mouth 2 (two) times daily., Disp: , Rfl:  .  gabapentin (NEURONTIN) 300 MG capsule, Take by mouth daily. , Disp: , Rfl:  .  Ginger, Zingiber officinalis, (GINGER ROOT) 550 MG CAPS, Take 1 capsule by mouth daily., Disp: , Rfl:  .  glipiZIDE (GLUCOTROL XL) 10 MG 24 hr tablet, TAKE 1 TABLET BY MOUTH AFTER LUNCH AND SUPPER, Disp: , Rfl:  .   levocarnitine (CARNITOR) 250 MG capsule, Take 500 mg by mouth daily., Disp: , Rfl:  .  losartan-hydrochlorothiazide (HYZAAR) 100-25 MG tablet, TAKE (1) TABLET BY MOUTH ONCE DAILY., Disp: , Rfl:  .  metFORMIN (GLUCOPHAGE-XR) 500 MG 24 hr tablet, TAKE 2 TABLETS TWICE DAILY AS DIRECTED., Disp: , Rfl:  .  Misc Natural Products (TART CHERRY ADVANCED) CAPS, Take by mouth., Disp: , Rfl:  .  Misc. Devices MISC, Avivia Plus lancets and strips. Tests daily  DX E11.9, Disp: , Rfl:  .  naproxen sodium (ANAPROX) 550 MG tablet, , Disp: , Rfl:  .  Nutritional Supplements (EQ ESTROBLEND MENOPAUSE) TABS, Take 1 tablet by mouth daily., Disp: , Rfl:  .  omega-3 acid ethyl esters (LOVAZA) 1 g capsule, Take 1 g by mouth daily., Disp: , Rfl:  .  pregabalin (LYRICA) 50 MG capsule, Take 50 mg by mouth 2 (two) times daily. , Disp: , Rfl:  .  pyridostigmine (MESTINON) 60 MG tablet, Takes 1/2 tablet four times per day, Disp: 120 tablet, Rfl: 6 .  rosuvastatin (CRESTOR) 5 MG tablet, Take 5 mg by mouth daily at 6 PM., Disp: , Rfl:  .  sitaGLIPtin (JANUVIA) 100 MG tablet, TAKE (1) TABLET BY MOUTH ONCE DAILY., Disp: , Rfl:  .  Turmeric Curcumin 500 MG CAPS, Take 1 capsule by mouth daily.,  Disp: , Rfl:   Past Medical History: Past Medical History:  Diagnosis Date  . Arthritis   . Bursitis   . Diabetes mellitus without complication ([place])   . Fibromyalgia   . GERD (gastroesophageal reflux disease)   . Hyperlipemia   . Hypertension   . Sinusitis   . Sleep apnea    uses a cpap  . Sleep apnea   . Wears glasses     Tobacco Use: Social History   Tobacco Use  Smoking Status Never Smoker  Smokeless Tobacco Never Used    Labs: Recent Review Scientist, physiological    Labs for ITP Cardiac and Pulmonary Rehab Latest Ref Rng & Units 01/07/2011   TCO2 0 - 100 mmol/L 25      Capillary Blood Glucose: Lab Results  Component Value Date   GLUCAP 140 (H) 09/09/2012     Pulmonary Assessment Scores: Pulmonary Assessment  Scores    Row Name 07/01/17 1540 11/20/17 1604       ADL UCSD   ADL Phase  Entry  Exit    SOB Score total  33  29    Rest  1  0    Walk  10  9    Stairs  4  4    Bath  0  0    Dress  0  0    Shop  0  0      CAT Score   CAT Score  11  23      mMRC Score   mMRC Score  3  2       Pulmonary Function Assessment: Pulmonary Function Assessment - 07/01/17 1539      Pulmonary Function Tests   FVC%  93 %    FEV1%  110 %    FEV1/FVC Ratio  117    RV%  35 %    DLCO%  71 %      Breath   Bilateral Breath Sounds  Clear    Shortness of Breath  Yes       Exercise Target Goals:    Exercise Program Goal: Individual exercise prescription set using results from initial 6 min walk test and THRR while considering  patient's activity barriers and safety.   Exercise Prescription Goal: Initial exercise prescription builds to 30-45 minutes a day of aerobic activity, 2-3 days per week.  Home exercise guidelines will be given to patient during program as part of exercise prescription that the participant will acknowledge.  Activity Barriers & Risk Stratification: Activity Barriers & Cardiac Risk Stratification - 07/01/17 1308      Activity Barriers & Cardiac Risk Stratification   Activity Barriers  Neck/Spine Problems;History of Falls;Deconditioning;Balance Concerns;Assistive Device;Shortness of Breath    Cardiac Risk Stratification  High       6 Minute Walk: 6 Minute Walk    Row Name 07/01/17 1411 11/20/17 1445       6 Minute Walk   Phase  Initial  Discharge    Distance  950 feet  950 feet    Distance % Change  0 %  21 %    Distance Feet Change  0 ft  1150 ft    Walk Time  6 minutes  6 minutes    # of Rest Breaks  0  0    MPH  1.79  2.17    METS  2.37  2.66    RPE  11  11    Perceived Dyspnea   13  11    VO2 Peak  8.17  10.13    Symptoms  No  -    Resting HR  82 bpm  81 bpm    Resting BP  132/64  128/76    Resting Oxygen Saturation   97 %  97 %    Exercise Oxygen  Saturation  during 6 min walk  97 %  97 %    Max Ex. HR  112 bpm  128 bpm    Max Ex. BP  180/76  178/76    2 Minute Post BP  136/64  120/62       Oxygen Initial Assessment: Oxygen Initial Assessment - 07/01/17 1556      Home Oxygen   Home Oxygen Device  None    Sleep Oxygen Prescription  None    Home Exercise Oxygen Prescription  None    Home at Rest Exercise Oxygen Prescription  None      Initial 6 min Walk   Oxygen Used  None      Program Oxygen Prescription   Program Oxygen Prescription  None       Oxygen Re-Evaluation: Oxygen Re-Evaluation    Row Name 07/31/17 1335 08/28/17 1518 09/18/17 1501 10/10/17 1348 11/05/17 1501     Program Oxygen Prescription   Program Oxygen Prescription  None  None  None  None  None     Home Oxygen   Home Oxygen Device  None  None  None  None  None   Sleep Oxygen Prescription  None  None  None  None  None   Home Exercise Oxygen Prescription  None  None  None  None  None   Home at Rest Exercise Oxygen Prescription  None  None  None  None  None     Goals/Expected Outcomes   Short Term Goals  To learn and understand importance of monitoring SPO2 with pulse oximeter and demonstrate accurate use of the pulse oximeter.;To learn and understand importance of maintaining oxygen saturations>88%;To learn and demonstrate proper pursed lip breathing techniques or other breathing techniques.  To learn and understand importance of monitoring SPO2 with pulse oximeter and demonstrate accurate use of the pulse oximeter.;To learn and understand importance of maintaining oxygen saturations>88%;To learn and demonstrate proper pursed lip breathing techniques or other breathing techniques.  To learn and understand importance of monitoring SPO2 with pulse oximeter and demonstrate accurate use of the pulse oximeter.;To learn and understand importance of maintaining oxygen saturations>88%;To learn and demonstrate proper pursed lip breathing techniques or other breathing  techniques.  To learn and understand importance of monitoring SPO2 with pulse oximeter and demonstrate accurate use of the pulse oximeter.;To learn and understand importance of maintaining oxygen saturations>88%;To learn and demonstrate proper pursed lip breathing techniques or other breathing techniques.  To learn and understand importance of monitoring SPO2 with pulse oximeter and demonstrate accurate use of the pulse oximeter.;To learn and understand importance of maintaining oxygen saturations>88%;To learn and demonstrate proper pursed lip breathing techniques or other breathing techniques.   Long  Term Goals  Verbalizes importance of monitoring SPO2 with pulse oximeter and return demonstration;Maintenance of O2 saturations>88%;Exhibits proper breathing techniques, such as pursed lip breathing or other method taught during program session  Verbalizes importance of monitoring SPO2 with pulse oximeter and return demonstration;Maintenance of O2 saturations>88%;Exhibits proper breathing techniques, such as pursed lip breathing or other method taught during program session  Verbalizes importance of monitoring SPO2 with pulse oximeter and return demonstration;Maintenance of O2 saturations>88%;Exhibits proper  breathing techniques, such as pursed lip breathing or other method taught during program session  Verbalizes importance of monitoring SPO2 with pulse oximeter and return demonstration;Maintenance of O2 saturations>88%;Exhibits proper breathing techniques, such as pursed lip breathing or other method taught during program session  Verbalizes importance of monitoring SPO2 with pulse oximeter and return demonstration;Maintenance of O2 saturations>88%;Exhibits proper breathing techniques, such as pursed lip breathing or other method taught during program session   Comments  Patient demonstrates proper use of pulse oximeter with return demonstration and she verbalizes the importance of maintaining O2 saturations  >88%. She also demonstrates proper pursed lip breathing during the program.   Patient demonstrates proper use of pulse oximeter with return demonstration and she verbalizes the importance of maintaining O2 saturations >88%. She also demonstrates proper pursed lip breathing during the program.   Patient demonstrates proper use of pulse oximeter with return demonstration and she verbalizes the importance of maintaining O2 saturations >88%. She also demonstrates proper pursed lip breathing during the program.   Patient demonstrates proper use of pulse oximeter with return demonstration and she verbalizes the importance of maintaining O2 saturations >88%. She also demonstrates proper pursed lip breathing during the program.   Patient demonstrates proper use of pulse oximeter with return demonstration and she verbalizes the importance of maintaining O2 saturations >88%. She also demonstrates proper pursed lip breathing during the program.    Goals/Expected Outcomes  Patient will continues to meet her short and long term goals.   Patient will continues to meet her short and long term goals.   Patient will continues to meet her short and long term goals.   Patient will continues to meet her short and long term goals.   Patient will continues to meet her short and long term goals.       Oxygen Discharge (Final Oxygen Re-Evaluation): Oxygen Re-Evaluation - 11/05/17 1501      Program Oxygen Prescription   Program Oxygen Prescription  None      Home Oxygen   Home Oxygen Device  None    Sleep Oxygen Prescription  None    Home Exercise Oxygen Prescription  None    Home at Rest Exercise Oxygen Prescription  None      Goals/Expected Outcomes   Short Term Goals  To learn and understand importance of monitoring SPO2 with pulse oximeter and demonstrate accurate use of the pulse oximeter.;To learn and understand importance of maintaining oxygen saturations>88%;To learn and demonstrate proper pursed lip breathing  techniques or other breathing techniques.    Long  Term Goals  Verbalizes importance of monitoring SPO2 with pulse oximeter and return demonstration;Maintenance of O2 saturations>88%;Exhibits proper breathing techniques, such as pursed lip breathing or other method taught during program session    Comments  Patient demonstrates proper use of pulse oximeter with return demonstration and she verbalizes the importance of maintaining O2 saturations >88%. She also demonstrates proper pursed lip breathing during the program.     Goals/Expected Outcomes  Patient will continues to meet her short and long term goals.        Initial Exercise Prescription: Initial Exercise Prescription - 07/01/17 1300      Date of Initial Exercise RX and Referring Provider   Date  07/01/17    Referring Provider  Dr. Chase Caller       NuStep   Level  2    SPM  66    Minutes  15    METs  1.4      Arm  Ergometer   Level  1.2    Watts  12    RPM  12    Minutes  20    METs  1.9      Prescription Details   Frequency (times per week)  3    Duration  Progress to 30 minutes of continuous aerobic without signs/symptoms of physical distress      Intensity   THRR 40-80% of Max Heartrate  [phone]    Ratings of Perceived Exertion  11-13    Perceived Dyspnea  0-4      Progression   Progression  Continue progressive overload as per policy without signs/symptoms or physical distress.      Resistance Training   Training Prescription  Yes    Weight  1    Reps  10-15       Perform Capillary Blood Glucose checks as needed.  Exercise Prescription Changes:  Exercise Prescription Changes    Row Name 07/17/17 0700 07/25/17 1400 08/20/17 1500 09/02/17 1000 09/15/17 1400     Response to Exercise   Blood Pressure (Admit)  120/62  116/50  118/58  112/64  128/74   Blood Pressure (Exercise)  180/80  180/64  170/62  140/72  160/58   Blood Pressure (Exit)  148/82  100/50  130/70  148/70  112/54   Heart Rate (Admit)   78 bpm  80 bpm  80 bpm  63 bpm  78 bpm   Heart Rate (Exercise)  94 bpm  95 bpm  96 bpm  94 bpm  87 bpm   Heart Rate (Exit)  69 bpm  77 bpm  74 bpm  72 bpm  87 bpm   Oxygen Saturation (Admit)  98 %  98 %  96 %  97 %  96 %   Oxygen Saturation (Exercise)  93 %  97 %  97 %  97 %  98 %   Oxygen Saturation (Exit)  96 %  96 %  97 %  96 %  97 %   Rating of Perceived Exertion (Exercise)  '13  13  13  13  13   ' Perceived Dyspnea (Exercise)  '13  13  13  13  13   ' Duration  Progress to 30 minutes of  aerobic without signs/symptoms of physical distress  Progress to 30 minutes of  aerobic without signs/symptoms of physical distress  Progress to 30 minutes of  aerobic without signs/symptoms of physical distress  Progress to 30 minutes of  aerobic without signs/symptoms of physical distress  Progress to 30 minutes of  aerobic without signs/symptoms of physical distress   Intensity  THRR New 107-121-136  THRR New 108-122-136  THRR unchanged 108-122-136  THRR unchanged 98-115-133  THRR unchanged 107-121-136     Progression   Progression  Continue to progress workloads to maintain intensity without signs/symptoms of physical distress.  Continue to progress workloads to maintain intensity without signs/symptoms of physical distress.  Continue to progress workloads to maintain intensity without signs/symptoms of physical distress.  Continue to progress workloads to maintain intensity without signs/symptoms of physical distress.  Continue to progress workloads to maintain intensity without signs/symptoms of physical distress.     Resistance Training   Training Prescription  Yes  Yes  Yes  Yes  Yes   Weight  '1  1  1  1  1   ' Reps  10-15  10-15  10-15  10-15  10-15     NuStep   Level  2  '2  2  2  2   ' SPM  81  97  120  117  134   Minutes  '20  20  20  20  30   ' METs  1.7  1.7  1.8  1.8  2     Arm Ergometer   Level  1  1.3  1.4  1.5  -   Watts  '1  3  14  15  ' -   RPM  '12  12  12  12  ' -   Minutes  '15  15  15  15  ' -    METs  1  1.3  2.3  2.5  -     Home Exercise Plan   Plans to continue exercise at  Home (comment)  Home (comment)  Home (comment)  Home (comment)  Home (comment)   Frequency  Add 2 additional days to program exercise sessions.  Add 2 additional days to program exercise sessions.  Add 2 additional days to program exercise sessions.  Add 2 additional days to program exercise sessions.  Add 2 additional days to program exercise sessions.   Initial Home Exercises Provided  07/15/17  07/15/17  07/15/17  07/15/17  07/15/17   Row Name 09/29/17 1400 10/14/17 1500 10/28/17 0800 11/16/17 1400       Response to Exercise   Blood Pressure (Admit)  130/72  138/60  112/60  128/76    Blood Pressure (Exercise)  134/50  190/88  146/70  178/80    Blood Pressure (Exit)  100/50  158/80  140/60  138/78    Heart Rate (Admit)  80 bpm  70 bpm  73 bpm  81 bpm    Heart Rate (Exercise)  84 bpm  91 bpm  83 bpm  82 bpm    Heart Rate (Exit)  66 bpm  71 bpm  81 bpm  95 bpm    Oxygen Saturation (Admit)  97 %  95 %  97 %  97 %    Oxygen Saturation (Exercise)  98 %  96 %  98 %  97 %    Oxygen Saturation (Exit)  99 %  97 %  97 %  94 %    Rating of Perceived Exertion (Exercise)  '11  13  11  11    ' Perceived Dyspnea (Exercise)  '11  13  11  13    ' Duration  Progress to 30 minutes of  aerobic without signs/symptoms of physical distress  Progress to 30 minutes of  aerobic without signs/symptoms of physical distress  Progress to 30 minutes of  aerobic without signs/symptoms of physical distress  Progress to 30 minutes of  aerobic without signs/symptoms of physical distress    Intensity  THRR New 108-122-136  THRR New 102-117-134  THRR New 104-119-135  THRR unchanged      Progression   Progression  Continue to progress workloads to maintain intensity without signs/symptoms of physical distress.  Continue to progress workloads to maintain intensity without signs/symptoms of physical distress.  Continue to progress workloads to maintain  intensity without signs/symptoms of physical distress.  Continue to progress workloads to maintain intensity without signs/symptoms of physical distress.      Resistance Training   Training Prescription  Yes  Yes  Yes  Yes    Weight  '1  2  2  2 ' Could not increase weights due to shoulder pain    Reps  10-15  10-15  10-15  10-15  Time  -  -  -  5 Minutes      NuStep   Level  '3  3  3  3    ' SPM  116  100  94  102    Minutes  '30  15  15  15    ' METs  1.8  1.9  2  2.1      Arm Ergometer   Level  -  1.7  1.7  1.8    Watts  -  '16  26  23    ' RPM  -  '16  16  18    ' Minutes  -  '20  20  20    ' METs  -  2.8  3  2.7      Home Exercise Plan   Plans to continue exercise at  Home (comment)  Home (comment)  Home (comment)  Home (comment)    Frequency  Add 2 additional days to program exercise sessions.  Add 2 additional days to program exercise sessions.  Add 2 additional days to program exercise sessions.  Add 2 additional days to program exercise sessions.    Initial Home Exercises Provided  07/15/17  07/15/17  07/15/17  07/15/17       Exercise Comments:  Exercise Comments    Row Name 07/17/17 0751 07/25/17 1429 08/20/17 1504 09/02/17 1013 09/15/17 1453   Exercise Comments  Patient has just started PR and will slowly be progressed in time.   Patient is doing very well in PR even on only her 5th session she has progressed on the arm ergometer to level 1.3. Patient has not been in the program long enough to see progress however she will continued to be monitored.   Patient is doing very well in PR she has increased her SPMs on the nustep and her level on the arm ergometer and she has gottn stronger since the program began. Patient enjoys the time with friends that she has made.   Patient continues to do well in PR. Patient has increased her level on the arm ergometer and has been keeping her SPMs high on the nustep machine. Patient continues to exercise with little to no SOB.   Patient continues to do  well in PR. Patient has progressed in [facility] on the nustep and is doing well on the arm ergometer when she can use it due to our broken machine and busy class. Patient has stated to me that she has increased strength and stamina since beginnin the program and that she truly enjoys it   [place] Name 09/29/17 1426 10/14/17 1538 10/28/17 0805       Exercise Comments  Patient is doing well in PR. Patient has maintained her weights for her dumbbells at the beginning of the workout and has increased her level on the nustep machine. Patient states that she feels stronger since joining the program.   Patient is doing well in PR. Patient has increased her level on the arm ergometer and has maintained her level on the nustep machine. patient has also increased the size of her dumbbells to 2lbs.   Patient has been doing very well in PR. Patient has maintained her level on the machines that she is on and has increased her SPMs on both machines as well as the METs.         Exercise Goals and Review:  Exercise Goals    Row Name 07/01/17 1313  Exercise Goals   Increase Physical Activity  Yes       Intervention  Provide advice, education, support and counseling about physical activity/exercise needs.;Develop an individualized exercise prescription for aerobic and resistive training based on initial evaluation findings, risk stratification, comorbidities and participant's personal goals.       Expected Outcomes  Short Term: Attend rehab on a regular basis to increase amount of physical activity.       Increase Strength and Stamina  Yes       Intervention  Develop an individualized exercise prescription for aerobic and resistive training based on initial evaluation findings, risk stratification, comorbidities and participant's personal goals.;Provide advice, education, support and counseling about physical activity/exercise needs.       Expected Outcomes  Short Term: Increase workloads from initial  exercise prescription for resistance, speed, and METs.       Able to understand and use rate of perceived exertion (RPE) scale  Yes       Intervention  Provide education and explanation on how to use RPE scale       Expected Outcomes  Short Term: Able to use RPE daily in rehab to express subjective intensity level;Long Term:  Able to use RPE to guide intensity level when exercising independently       Able to understand and use Dyspnea scale  Yes       Intervention  Provide education and explanation on how to use Dyspnea scale       Expected Outcomes  Short Term: Able to use Dyspnea scale daily in rehab to express subjective sense of shortness of breath during exertion;Long Term: Able to use Dyspnea scale to guide intensity level when exercising independently       Knowledge and understanding of Target Heart Rate Range (THRR)  Yes       Intervention  Provide education and explanation of THRR including how the numbers were predicted and where they are located for reference       Expected Outcomes  Short Term: Able to state/look up THRR;Long Term: Able to use THRR to govern intensity when exercising independently;Short Term: Able to use daily as guideline for intensity in rehab       Able to check pulse independently  Yes       Intervention  Provide education and demonstration on how to check pulse in carotid and radial arteries.;Review the importance of being able to check your own pulse for safety during independent exercise       Expected Outcomes  Short Term: Able to explain why pulse checking is important during independent exercise;Long Term: Able to check pulse independently and accurately       Understanding of Exercise Prescription  Yes       Intervention  Provide education, explanation, and written materials on patient's individual exercise prescription       Expected Outcomes  Short Term: Able to explain program exercise prescription;Long Term: Able to explain home exercise prescription to  exercise independently          Exercise Goals Re-Evaluation : Exercise Goals Re-Evaluation    Row Name 07/25/17 1428 08/25/17 1454 09/15/17 1452 10/07/17 0814 11/03/17 1455     Exercise Goal Re-Evaluation   Exercise Goals Review  Increase Physical Activity;Increase Strength and Stamina;Able to understand and use Dyspnea scale;Knowledge and understanding of Target Heart Rate Range (THRR);Able to understand and use rate of perceived exertion (RPE) scale;Able to check pulse independently  Increase Physical Activity;Increase Strength and Stamina;Able to  understand and use Dyspnea scale;Knowledge and understanding of Target Heart Rate Range (THRR);Able to understand and use rate of perceived exertion (RPE) scale;Able to check pulse independently  Increase Physical Activity;Increase Strength and Stamina;Able to understand and use Dyspnea scale;Knowledge and understanding of Target Heart Rate Range (THRR);Able to understand and use rate of perceived exertion (RPE) scale;Able to check pulse independently  Increase Physical Activity;Increase Strength and Stamina;Able to understand and use Dyspnea scale;Knowledge and understanding of Target Heart Rate Range (THRR);Able to understand and use rate of perceived exertion (RPE) scale;Able to check pulse independently  Increase Physical Activity;Increase Strength and Stamina;Able to understand and use Dyspnea scale;Knowledge and understanding of Target Heart Rate Range (THRR);Able to understand and use rate of perceived exertion (RPE) scale;Able to check pulse independently   Comments  Patient is doing very well in PR even on only her 5th session she has progressed on the arm ergometer to level 1.3. Patient has not been in the program long enough to see progress however she will continued to be monitored.   Patient is doing well in PR and has maintained her levels on the two machines the arm ergometer and the nustep machine. Patient seems to be enjoying the program and  looks to have gained some mobility from the program. Patient will continued to be monitored throughout the remainder of the program.   Patient continues to do well in PR. Patient has progressed in [facility] on the nustep and is doing well on the arm ergometer when she can use it due to our broken machine and busy class. Patient has stated to me that she has increased strength and stamina since beginnin the program and that she truly enjoys it  Patient continues to do better in CR. patiet has increased her level on the nustep to level 3 since last ITP. Patient has stated to me that she has better mobility and she can do much more around the house and at her local church without as much SOB.  Patient has been doing very well in PR. Patient has stated to me that she has gotten betetr mobility from the program and that she is being active outside of class with church activities.    Expected Outcomes  Patient wishes to have better mobility and to lose weight.   Patient wishes to have better mobility and to lose weight.   Patient wishes to have better mobility and to lose weight.   Patient wishes to have better mobility and to lose weight.   Patient wishes to have better mobility and to lose weight.       Discharge Exercise Prescription (Final Exercise Prescription Changes): Exercise Prescription Changes - 11/16/17 1400      Response to Exercise   Blood Pressure (Admit)  128/76    Blood Pressure (Exercise)  178/80    Blood Pressure (Exit)  138/78    Heart Rate (Admit)  81 bpm    Heart Rate (Exercise)  82 bpm    Heart Rate (Exit)  95 bpm    Oxygen Saturation (Admit)  97 %    Oxygen Saturation (Exercise)  97 %    Oxygen Saturation (Exit)  94 %    Rating of Perceived Exertion (Exercise)  11    Perceived Dyspnea (Exercise)  13    Duration  Progress to 30 minutes of  aerobic without signs/symptoms of physical distress    Intensity  THRR unchanged      Progression   Progression  Continue to progress  workloads  to maintain intensity without signs/symptoms of physical distress.      Resistance Training   Training Prescription  Yes    Weight  2 Could not increase weights due to shoulder pain    Reps  10-15    Time  5 Minutes      NuStep   Level  3    SPM  102    Minutes  15    METs  2.1      Arm Ergometer   Level  1.8    Watts  23    RPM  18    Minutes  20    METs  2.7      Home Exercise Plan   Plans to continue exercise at  Home (comment)    Frequency  Add 2 additional days to program exercise sessions.    Initial Home Exercises Provided  07/15/17       Nutrition:  Target Goals: Understanding of nutrition guidelines, daily intake of sodium <1530m, cholesterol <2027m calories 30% from fat and 7% or less from saturated fats, daily to have 5 or more servings of fruits and vegetables.  Biometrics: Pre Biometrics - 07/01/17 1313      Pre Biometrics   Height  '5\' 5"'  (1.651 m)    Weight  214 lb 1.6 oz (97.1 kg)    Waist Circumference  48.5 inches    Hip Circumference  47 inches    Waist to Hip Ratio  1.03 %    BMI (Calculated)  35.63    Triceps Skinfold  26 mm    % Body Fat  47.6 %    Grip Strength  28.93 kg    Flexibility  0 in    Single Leg Stand  0 seconds      Post Biometrics - 11/20/17 1450       Post  Biometrics   Height  '5\' 5"'  (1.651 m)    Weight  212 lb (96.2 kg)    Waist Circumference  48.5 inches    Hip Circumference  47 inches    Waist to Hip Ratio  1.03 %    BMI (Calculated)  35.28    Triceps Skinfold  26 mm    % Body Fat  46.9 %    Grip Strength  20.64 kg    Flexibility  0 in    Single Leg Stand  0 seconds       Nutrition Therapy Plan and Nutrition Goals: Nutrition Therapy & Goals - 07/31/17 1526      Personal Nutrition Goals   Nutrition Goal  For heart healthy choices add >50% of whole grains, make half their plate fruits and vegetables. Discuss the difference between starchy vegetables and leafy greens, and how leafy vegetables provide fiber,  helps maintain healthy weight, helps control blood glucose, and lowers cholesterol.  Discuss purchasing fresh or frozen vegetable to reduce sodium and not to add grease, fat or sugar. Consume <18oz of red meat per week. Consume lean cuts of meats and very little of meats high in sodium and nitrates such as pork and lunch meats. Discussed portion control for all food groups.      Additional Goals?  No    Comments  Patient met with RD 07/31/17.      Intervention Plan   Intervention  Nutrition handout(s) given to patient.    Expected Outcomes  Short Term Goal: A plan has been developed with personal nutrition goals set during dietitian appointment.;Long  Term Goal: Adherence to prescribed nutrition plan.       Nutrition Assessments: Nutrition Assessments - 11/20/17 1602      MEDFICTS Scores   Pre Score  62    Post Score  6    Score Difference  -56       Nutrition Goals Re-Evaluation: Nutrition Goals Re-Evaluation    Row Name 08/28/17 1518 09/18/17 1501 10/10/17 1349 11/05/17 1502       Goals   Current Weight  208 lb 8 oz (94.6 kg)  211 lb 4.8 oz (95.8 kg)  215 lb 6.4 oz (97.7 kg)  211 lb 12.8 oz (96.1 kg)    Nutrition Goal  For heart healthy choices add >50% of whole grains, make half their plate fruits and vegetables. Discuss the difference between starchy vegetables and leafy greens, and how leafy vegetables provide fiber, helps maintain healthy weight, helps control blood glucose, and lowers cholesterol.  Discuss purchasing fresh or frozen vegetable to reduce sodium and not to add grease, fat or sugar. Consume <18oz of red meat per week. Consume lean cuts of meats and very little of meats high in sodium and nitrates such as pork and lunch meats. Discussed portion control for all food groups.    For heart healthy choices add >50% of whole grains, make half their plate fruits and vegetables. Discuss the difference between starchy vegetables and leafy greens, and how leafy vegetables provide  fiber, helps maintain healthy weight, helps control blood glucose, and lowers cholesterol.  Discuss purchasing fresh or frozen vegetable to reduce sodium and not to add grease, fat or sugar. Consume <18oz of red meat per week. Consume lean cuts of meats and very little of meats high in sodium and nitrates such as pork and lunch meats. Discussed portion control for all food groups.    For heart healthy choices add >50% of whole grains, make half their plate fruits and vegetables. Discuss the difference between starchy vegetables and leafy greens, and how leafy vegetables provide fiber, helps maintain healthy weight, helps control blood glucose, and lowers cholesterol.  Discuss purchasing fresh or frozen vegetable to reduce sodium and not to add grease, fat or sugar. Consume <18oz of red meat per week. Consume lean cuts of meats and very little of meats high in sodium and nitrates such as pork and lunch meats. Discussed portion control for all food groups.    For heart healthy choices add >50% of whole grains, make half their plate fruits and vegetables. Discuss the difference between starchy vegetables and leafy greens, and how leafy vegetables provide fiber, helps maintain healthy weight, helps control blood glucose, and lowers cholesterol.  Discuss purchasing fresh or frozen vegetable to reduce sodium and not to add grease, fat or sugar. Consume <18oz of red meat per week. Consume lean cuts of meats and very little of meats high in sodium and nitrates such as pork and lunch meats. Discussed portion control for all food groups.      Comment  Patient has lost 5.6 lbs since last 30 day review. 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 Will continue to monitor for progress.   Patient has gained 3 lbs since last 30 day review. She continues to follow a regular diet. Will continue to monitor for progress.   Patient has gained 4 lbs since last 30 day review. She continues to follow a regular diet. Will continue to monitor for  progress.   Patient has gained 4 lbs since last 30 day review.  She says she continues to follow a regular diet. Will continue to monitor for progress.     Expected Outcome  Patient will continue to lose weight.   Patient will continue to lose weight.   Patient will continue to lose weight.   Patient will continue meet her nutritional goals.       Personal Goal #2 Re-Evaluation   Personal Goal #2  Patient eats a regular diet.   Patient eats a regular diet.   Patient eats a regular diet.   Patient eats a regular diet.        Nutrition Goals Discharge (Final Nutrition Goals Re-Evaluation): Nutrition Goals Re-Evaluation - 11/05/17 1502      Goals   Current Weight  211 lb 12.8 oz (96.1 kg)    Nutrition Goal  For heart healthy choices add >50% of whole grains, make half their plate fruits and vegetables. Discuss the difference between starchy vegetables and leafy greens, and how leafy vegetables provide fiber, helps maintain healthy weight, helps control blood glucose, and lowers cholesterol.  Discuss purchasing fresh or frozen vegetable to reduce sodium and not to add grease, fat or sugar. Consume <18oz of red meat per week. Consume lean cuts of meats and very little of meats high in sodium and nitrates such as pork and lunch meats. Discussed portion control for all food groups.      Comment  Patient has gained 4 lbs since last 30 day review. She says she continues to follow a regular diet. Will continue to monitor for progress.     Expected Outcome  Patient will continue meet her nutritional goals.       Personal Goal #2 Re-Evaluation   Personal Goal #2  Patient eats a regular diet.        Psychosocial: Target Goals: Acknowledge presence or absence of significant depression and/or stress, maximize coping skills, provide positive support system. Participant is able to verbalize types and ability to use techniques and skills needed for reducing stress and depression.  Initial Review & Psychosocial  Screening: Initial Psych Review & Screening - 07/01/17 1603      Initial Review   Current issues with  Current Depression;Current Sleep Concerns      Family Dynamics   Good Support System?  Yes      Barriers   Psychosocial barriers to participate in program  Psychosocial barriers identified (see note) QOL score indicated depression at 15.56 overall.      Screening Interventions   Interventions  Encouraged to exercise;Provide feedback about the scores to participant    Expected Outcomes  Short Term goal: Identification and review with participant of any Quality of Life or Depression concerns found by scoring the questionnaire.;Long Term goal: The participant improves quality of Life and PHQ9 Scores as seen by post scores and/or verbalization of changes       Quality of Life Scores: Quality of Life - 11/20/17 1458      Quality of Life   Select  Quality of Life      Quality of Life Scores   Health/Function Pre  13.27 %    Health/Function Post  21.31 %    Health/Function % Change  60.59 %    Socioeconomic Pre  11.33 %    Socioeconomic Post  19.33 %    Socioeconomic % Change   70.61 %    Psych/Spiritual Pre  18.07 %    Psych/Spiritual Post  24 %    Psych/Spiritual % Change  32.82 %  Family Pre  24 %    Family Post  24 %    Family % Change  0 %    GLOBAL Pre  15.56 %    GLOBAL Post  21.91 %    GLOBAL % Change  40.81 %      Scores of 19 and below usually indicate a poorer quality of life in these areas.  A difference of  2-3 points is a clinically meaningful difference.  A difference of 2-3 points in the total score of the Quality of Life Index has been associated with significant improvement in overall quality of life, self-image, physical symptoms, and general health in studies assessing change in quality of life.   PHQ-9: Recent Review Flowsheet Data    Depression screen [facility] 2/9 11/20/2017 07/01/2017   Decreased Interest 0 0   Down, Depressed, Hopeless 1 0   PHQ - 2 Score  1 0   Altered sleeping 0 2   Tired, decreased energy 1 3   Change in appetite 1 1   Feeling bad or failure about yourself  0 0   Trouble concentrating 1 0   Moving slowly or fidgety/restless 0 0   Suicidal thoughts 0 0   PHQ-9 Score 4 6   Difficult doing work/chores Not difficult at all Somewhat difficult     Interpretation of Total Score  Total Score Depression Severity:  1-4 = Minimal depression, 5-9 = Mild depression, 10-14 = Moderate depression, 15-19 = Moderately severe depression, 20-27 = Severe depression   Psychosocial Evaluation and Intervention: Psychosocial Evaluation - 11/20/17 1614      Discharge Psychosocial Assessment & Intervention   Comments  Patient's exit QOL score improved by 40.81% at 21.91% and her PHQ-9 score at initial was 6 and 4 at discharge. She does continue to have sleep issues but says she is not depressed. She has a good outlook and plans to continue exercising and is pleased with her progress in the program.        Psychosocial Re-Evaluation: Psychosocial Re-Evaluation    [place] Name 07/31/17 1342 08/28/17 1523 09/18/17 1505 10/10/17 1352 11/05/17 1505     Psychosocial Re-Evaluation   Current issues with  Current Depression;Current Sleep Concerns  Current Depression;Current Sleep Concerns  Current Depression;Current Sleep Concerns  Current Depression;Current Sleep Concerns  Current Depression;Current Sleep Concerns   Comments  Patient's initial QOL score was 15.56 and her PHQ-9 score was 6 indicating depression. She says her does not feel she needs treatment at this time.   Patient's initial QOL score was 15.56 and her PHQ-9 score was 6 indicating depression. She says her does not feel she needs treatment at this time.   Patient's initial QOL score was 15.56 and her PHQ-9 score was 6 indicating depression. She says her does not feel she needs treatment at this time. Still reports difficulty sleeping.   Patient's initial QOL score was 15.56 and her PHQ-9  score was 6 indicating depression. She says she does not feel she needs treatment at this time. Still reports difficulty sleeping.   Patient's initial QOL score was 15.56 and her PHQ-9 score was 6 indicating depression. She says she does not feel she needs treatment at this time. Still reports difficulty sleeping.    Expected Outcomes  Patient's QOL and PHQ-9 scores will improve or remain the same at discharge and she will have no additional psychosocial issues identified.   Patient's QOL and PHQ-9 scores will improve or remain the same at discharge and  she will have no additional psychosocial issues identified.   Patient's QOL and PHQ-9 scores will improve or remain the same at discharge and she will have no additional psychosocial issues identified.   Patient's QOL and PHQ-9 scores will improve or remain the same at discharge and she will have no additional psychosocial issues identified.   Patient's QOL and PHQ-9 scores will improve or remain the same at discharge and she will have no additional psychosocial issues identified.    Interventions  Stress management education;Encouraged to attend Cardiac Rehabilitation for the exercise;Relaxation education  Stress management education;Encouraged to attend Cardiac Rehabilitation for the exercise;Relaxation education  Stress management education;Encouraged to attend Cardiac Rehabilitation for the exercise;Relaxation education  Stress management education;Encouraged to attend Cardiac Rehabilitation for the exercise;Relaxation education  Stress management education;Encouraged to attend Cardiac Rehabilitation for the exercise;Relaxation education   Continue Psychosocial Services   No Follow up required  No Follow up required  No Follow up required  No Follow up required  No Follow up required      Psychosocial Discharge (Final Psychosocial Re-Evaluation): Psychosocial Re-Evaluation - 11/05/17 1505      Psychosocial Re-Evaluation   Current issues with  Current  Depression;Current Sleep Concerns    Comments  Patient's initial QOL score was 15.56 and her PHQ-9 score was 6 indicating depression. She says she does not feel she needs treatment at this time. Still reports difficulty sleeping.     Expected Outcomes  Patient's QOL and PHQ-9 scores will improve or remain the same at discharge and she will have no additional psychosocial issues identified.     Interventions  Stress management education;Encouraged to attend Cardiac Rehabilitation for the exercise;Relaxation education    Continue Psychosocial Services   No Follow up required        Education: Education Goals: Education classes will be provided on a weekly basis, covering required topics. Participant will state understanding/return demonstration of topics presented.  Learning Barriers/Preferences: Learning Barriers/Preferences - 07/01/17 1518      Learning Barriers/Preferences   Learning Barriers  None    Learning Preferences  Verbal Instruction;Skilled Demonstration;Group Instruction       Education Topics: How Lungs Work and Diseases: - Discuss the anatomy of the lungs and diseases that can affect the lungs, such as COPD.   PULMONARY REHAB OTHER RESPIRATORY from 11/13/2017 in [place]  Date  09/04/17  Educator  DWynetta Emery  Instruction Review Code  2- Demonstrated Understanding      Exercise: -Discuss the importance of exercise, FITT principles of exercise, normal and abnormal responses to exercise, and how to exercise safely.   PULMONARY REHAB OTHER RESPIRATORY from 11/13/2017 in [place]  Date  08/28/17  Educator  DC  Instruction Review Code  2- Demonstrated Understanding      Environmental Irritants: -Discuss types of environmental irritants and how to limit exposure to environmental irritants.   PULMONARY REHAB OTHER RESPIRATORY from 11/13/2017 in [place]  Date  09/11/17  Educator  DJ  Instruction  Review Code  2- Demonstrated Understanding      Meds/Inhalers and oxygen: - Discuss respiratory medications, definition of an inhaler and oxygen, and the proper way to use an inhaler and oxygen.   PULMONARY REHAB OTHER RESPIRATORY from 11/13/2017 in [place]  Date  09/18/17  Educator  DC      Energy Saving Techniques: - Discuss methods to conserve energy and decrease shortness of breath when performing activities of daily  living.    PULMONARY REHAB OTHER RESPIRATORY from 11/13/2017 in [place]  Date  09/25/17  Educator  DC  Instruction Review Code  2- Demonstrated Understanding      Bronchial Hygiene / Breathing Techniques: - Discuss breathing mechanics, pursed-lip breathing technique,  proper posture, effective ways to clear airways, and other functional breathing techniques   PULMONARY REHAB OTHER RESPIRATORY from 11/13/2017 in [place]  Date  10/02/17  Educator  DJ  Instruction Review Code  2- Demonstrated Understanding      Cleaning Equipment: - Provides group verbal and written instruction about the health risks of elevated stress, cause of high stress, and healthy ways to reduce stress.   Nutrition I: Fats: - Discuss the types of cholesterol, what cholesterol does to the body, and how cholesterol levels can be controlled.   PULMONARY REHAB OTHER RESPIRATORY from 11/13/2017 in [place]  Date  07/17/17  Educator  DJ  Instruction Review Code  2- Demonstrated Understanding      Nutrition II: Labels: -Discuss the different components of food labels and how to read food labels.   PULMONARY REHAB OTHER RESPIRATORY from 11/13/2017 in [place]  Date  07/24/17  Educator  DJ  Instruction Review Code  2- Demonstrated Understanding      Respiratory Infections: - Discuss the signs and symptoms of respiratory infections, ways to prevent respiratory  infections, and the importance of seeking medical treatment when having a respiratory infection.   PULMONARY REHAB OTHER RESPIRATORY from 11/13/2017 in [place]  Date  07/31/17  Educator  DJ  Instruction Review Code  2- Demonstrated Understanding      Stress I: Signs and Symptoms: - Discuss the causes of stress, how stress may lead to anxiety and depression, and ways to limit stress.   PULMONARY REHAB OTHER RESPIRATORY from 11/13/2017 in [place]  Date  08/07/17  Educator  DJ  Instruction Review Code  2- Demonstrated Understanding      Stress II: Relaxation: -Discuss relaxation techniques to limit stress.   PULMONARY REHAB OTHER RESPIRATORY from 11/13/2017 in [place]  Date  08/14/17  Educator  DJ  Instruction Review Code  2- Demonstrated Understanding      Oxygen for Home/Travel: - Discuss how to prepare for travel when on oxygen and proper ways to transport and store oxygen to ensure safety.   PULMONARY REHAB OTHER RESPIRATORY from 11/13/2017 in [place]  Date  08/21/17  Educator  DC  Instruction Review Code  2- Demonstrated Understanding      Knowledge Questionnaire Score: Knowledge Questionnaire Score - 11/20/17 1605      Knowledge Questionnaire Score   Pre Score  15/18    Post Score  16/18       Core Components/Risk Factors/Patient Goals at Admission: Personal Goals and Risk Factors at Admission - 07/01/17 1600      Core Components/Risk Factors/Patient Goals on Admission    Weight Management  Yes    Intervention  Weight Management/Obesity: Establish reasonable short term and long term weight goals.;Obesity: Provide education and appropriate resources to help participant work on and attain dietary goals.    Admit Weight  214 lb 1.6 oz (97.1 kg)    Goal Weight: Short Term  204 lb 1.6 oz (92.6 kg)    Goal Weight: Long Term  194 lb 1.6 oz (88 kg)    Expected Outcomes   Short Term:  Continue to assess and modify interventions until short term weight is achieved;Long Term: Adherence to nutrition and physical activity/exercise program aimed toward attainment of established weight goal    Personal Goal Other  Yes    Personal Goal  Better mobility, lose 25-30lbs    Intervention  Attend PR 2 x week and supplement at home exercise 3 x week.    Expected Outcomes  Reach personal goals.       Core Components/Risk Factors/Patient Goals Review:  Goals and Risk Factor Review    Row Name 07/31/17 1337 08/28/17 1521 09/18/17 1502 10/10/17 1349 11/05/17 1503     Core Components/Risk Factors/Patient Goals Review   Personal Goals Review  Weight Management/Obesity;Increase knowledge of respiratory medications and ability to use respiratory devices properly.;Improve shortness of breath with ADL's Better mobility; lose weight 25-30 lbs;  Do yard work.   Weight Management/Obesity;Increase knowledge of respiratory medications and ability to use respiratory devices properly.;Improve shortness of breath with ADL's Better mobility; lose weight 25-30 lbs. Do yard work again.   Weight Management/Obesity;Increase knowledge of respiratory medications and ability to use respiratory devices properly.;Improve shortness of breath with ADL's Better mobility; lose weight 25-30 lbs; Do yard work again.   Weight Management/Obesity;Increase knowledge of respiratory medications and ability to use respiratory devices properly.;Improve shortness of breath with ADL's Better mobility; lose weight 25-30 lbs; do yard work again.   Weight Management/Obesity;Increase knowledge of respiratory medications and ability to use respiratory devices properly.;Improve shortness of breath with ADL's Better mobility; lose weight 25-30 lbs; Do yard work again.    Review  Patient has completed 7 sessions losing 3.5 lbs. She is doing well in the program with some progression. She says it is too soon to tell if the program is  making a difference. She hopes to meet her goals as she continues the program. Will continue to monitor for progress.   Patient has completed 15 sessions losing 4 lbs. She continues to do well in the program with progression. She says the program is helping her. She feels stronger and has more endurance to do things. She says she feels like she could do yard work but is taking a medication that does no allow to get out in the sun or sweat excessively. She is afraid to try. She is very pleased with her progress. Will continue to monitor for progress.   Patient has completed 21 sessions gaining 3 lbs since last 30 day review. She continues to do well in the program with progression. She continues to say the program is helping her. She says she has more endurance and more energy to do things. She says she feels like she has gained some strength since last 30 day review. Will continue to monitor for progress.   Patient has completed 27 sessions gaining 4 lbs since last 30 day review. She blames weight gain on fluid. She continues to do well in the program with progression. She says she is continuing to get stronger and has more stamina. She says she feels she has gained more mobility in her upper extremtities and feels her balance has improved with better walking. She is very pleased with her progress. Will continue to monitor.   Patient has completed 33 sessions gaining 4 lbs since last 30 day review. She continues to do well in the program with progression. She says she continues to feel stronger with increased energy. She does feel more flexible in her upper extremtities and feels she is walking better with  improved balance. Will continue to monitor for progress.    Expected Outcomes  Patient will continue to attend sessions and complete the program.   Patient will continue to attend sessions and complete the program.   Patient will continue to attend sessions and complete the program.   Patient will continue to  attend sessions and complete the program.   Patient will continue to attend sessions and complete the program.    [place] Name 11/20/17 1616             Core Components/Risk Factors/Patient Goals Review   Personal Goals Review  Weight Management/Obesity;Increase knowledge of respiratory medications and ability to use respiratory devices properly.;Improve shortness of breath with ADL's Better mobility; lose 25 lbs.        Review  Patient graduated with 36 sessions losing 2 lbs overall. She did well in the program stating she feels a lot better overall both mentally and physically. She feels stronger and has more stamina. She also says her mobility and flexibility has improved. Her Medficts score improved by 56. Her grip strength increased. Her waist circumference decreased by 0.5 inches. Her exit walk test improved by 21%. Her pulmonary assessment scores also improved. She says she is breathing better with less SOB with activities. She plans on joining our [employer] program to continue exercising.        Expected Outcomes  Patient will join our [employer] program and continue exercising and continue to meet her personal goals.           Core Components/Risk Factors/Patient Goals at Discharge (Final Review):  Goals and Risk Factor Review - 11/20/17 1616      Core Components/Risk Factors/Patient Goals Review   Personal Goals Review  Weight Management/Obesity;Increase knowledge of respiratory medications and ability to use respiratory devices properly.;Improve shortness of breath with ADL's Better mobility; lose 25 lbs.     Review  Patient graduated with 36 sessions losing 2 lbs overall. She did well in the program stating she feels a lot better overall both mentally and physically. She feels stronger and has more stamina. She also says her mobility and flexibility has improved. Her Medficts score improved by 56. Her grip strength increased. Her waist circumference decreased by  0.5 inches. Her exit walk test improved by 21%. Her pulmonary assessment scores also improved. She says she is breathing better with less SOB with activities. She plans on joining our [employer] program to continue exercising.     Expected Outcomes  Patient will join our [employer] program and continue exercising and continue to meet her personal goals.        ITP Comments: ITP Comments    Row Name 07/01/17 1543 07/03/17 1336         ITP Comments  Mrs. Wideman is a 72 year old female that has ILD with SOB. She has also been diagnosed with Myasthenia Gravis She is depressed according to her QOL scores. We will continue to address this area as she comes through our program.   Patient new to program. Plans to start Tuesday 07/08/17. Will continue to monitor for progress.          Comments: Patient graduated from Pulmonary Rehabilitation today on 11/13/2017 after completing 36 sessions. She achieved LTG of 30 minutes of aerobic exercise at Max Met level of 2.66.  All patients vitals are WNL. Patient has met with dietician. Discharge instruction has been reviewed in detail and patient stated an  understanding of material given. Patient plans to join our [employer] program to continue exercising. Pulmonary Rehab staff will make f/u calls at 1 month, 6 months, and 1 year. Patient had no complaints of any abnormal S/S or pain on their exit visit.

## 2017-11-20 NOTE — Progress Notes (Signed)
Discharge Progress Report  Patient Details  Name: [name] MRN: [id number] Date of Birth: [birth year] Referring Provider:     PULMONARY REHAB OTHER RESP ORIENTATION from 07/01/2017 in [place]  Referring Provider  Dr. Chase Caller        Number of Visits: 36  Reason for Discharge:  Patient reached a stable level of exercise. Patient independent in their exercise. Patient has met program and personal goals.  Smoking History:  Social History   Tobacco Use  Smoking Status Never Smoker  Smokeless Tobacco Never Used    Diagnosis:  ILD (interstitial lung disease) ([place])  ADL UCSD: Pulmonary Assessment Scores    Row Name 07/01/17 1540 11/20/17 1604       ADL UCSD   ADL Phase  Entry  Exit    SOB Score total  33  29    Rest  1  0    Walk  10  9    Stairs  4  4    Bath  0  0    Dress  0  0    Shop  0  0      CAT Score   CAT Score  11  23      mMRC Score   mMRC Score  3  2       Initial Exercise Prescription: Initial Exercise Prescription - 07/01/17 1300      Date of Initial Exercise RX and Referring Provider   Date  07/01/17    Referring Provider  Dr. Chase Caller       NuStep   Level  2    SPM  66    Minutes  15    METs  1.4      Arm Ergometer   Level  1.2    Watts  12    RPM  12    Minutes  20    METs  1.9      Prescription Details   Frequency (times per week)  3    Duration  Progress to 30 minutes of continuous aerobic without signs/symptoms of physical distress      Intensity   THRR 40-80% of Max Heartrate  [phone]    Ratings of Perceived Exertion  11-13    Perceived Dyspnea  0-4      Progression   Progression  Continue progressive overload as per policy without signs/symptoms or physical distress.      Resistance Training   Training Prescription  Yes    Weight  1    Reps  10-15       Discharge Exercise Prescription (Final Exercise Prescription Changes): Exercise Prescription Changes - 11/16/17 1400       Response to Exercise   Blood Pressure (Admit)  128/76    Blood Pressure (Exercise)  178/80    Blood Pressure (Exit)  138/78    Heart Rate (Admit)  81 bpm    Heart Rate (Exercise)  82 bpm    Heart Rate (Exit)  95 bpm    Oxygen Saturation (Admit)  97 %    Oxygen Saturation (Exercise)  97 %    Oxygen Saturation (Exit)  94 %    Rating of Perceived Exertion (Exercise)  11    Perceived Dyspnea (Exercise)  13    Duration  Progress to 30 minutes of  aerobic without signs/symptoms of physical distress    Intensity  THRR unchanged      Progression   Progression  Continue to  progress workloads to maintain intensity without signs/symptoms of physical distress.      Resistance Training   Training Prescription  Yes    Weight  2 Could not increase weights due to shoulder pain    Reps  10-15    Time  5 Minutes      NuStep   Level  3    SPM  102    Minutes  15    METs  2.1      Arm Ergometer   Level  1.8    Watts  23    RPM  18    Minutes  20    METs  2.7      Home Exercise Plan   Plans to continue exercise at  Home (comment)    Frequency  Add 2 additional days to program exercise sessions.    Initial Home Exercises Provided  07/15/17       Functional Capacity: 6 Minute Walk    Row Name 07/01/17 1411 11/20/17 1445       6 Minute Walk   Phase  Initial  Discharge    Distance  950 feet  950 feet    Distance % Change  0 %  21 %    Distance Feet Change  0 ft  1150 ft    Walk Time  6 minutes  6 minutes    # of Rest Breaks  0  0    MPH  1.79  2.17    METS  2.37  2.66    RPE  11  11    Perceived Dyspnea   13  11    VO2 Peak  8.17  10.13    Symptoms  No  -    Resting HR  82 bpm  81 bpm    Resting BP  132/64  128/76    Resting Oxygen Saturation   97 %  97 %    Exercise Oxygen Saturation  during 6 min walk  97 %  97 %    Max Ex. HR  112 bpm  128 bpm    Max Ex. BP  180/76  178/76    2 Minute Post BP  136/64  120/62       Psychological, QOL, Others - Outcomes: PHQ  2/9: Depression screen [facility] 2/9 11/20/2017 07/01/2017  Decreased Interest 0 0  Down, Depressed, Hopeless 1 0  PHQ - 2 Score 1 0  Altered sleeping 0 2  Tired, decreased energy 1 3  Change in appetite 1 1  Feeling bad or failure about yourself  0 0  Trouble concentrating 1 0  Moving slowly or fidgety/restless 0 0  Suicidal thoughts 0 0  PHQ-9 Score 4 6  Difficult doing work/chores Not difficult at all Somewhat difficult    Quality of Life: Quality of Life - 11/20/17 1458      Quality of Life   Select  Quality of Life      Quality of Life Scores   Health/Function Pre  13.27 %    Health/Function Post  21.31 %    Health/Function % Change  60.59 %    Socioeconomic Pre  11.33 %    Socioeconomic Post  19.33 %    Socioeconomic % Change   70.61 %    Psych/Spiritual Pre  18.07 %    Psych/Spiritual Post  24 %    Psych/Spiritual % Change  32.82 %    Family Pre  24 %  Family Post  24 %    Family % Change  0 %    GLOBAL Pre  15.56 %    GLOBAL Post  21.91 %    GLOBAL % Change  40.81 %       Personal Goals: Goals established at orientation with interventions provided to work toward goal. Personal Goals and Risk Factors at Admission - 07/01/17 1600      Core Components/Risk Factors/Patient Goals on Admission    Weight Management  Yes    Intervention  Weight Management/Obesity: Establish reasonable short term and long term weight goals.;Obesity: Provide education and appropriate resources to help participant work on and attain dietary goals.    Admit Weight  214 lb 1.6 oz (97.1 kg)    Goal Weight: Short Term  204 lb 1.6 oz (92.6 kg)    Goal Weight: Long Term  194 lb 1.6 oz (88 kg)    Expected Outcomes  Short Term: Continue to assess and modify interventions until short term weight is achieved;Long Term: Adherence to nutrition and physical activity/exercise program aimed toward attainment of established weight goal    Personal Goal Other  Yes    Personal Goal  Better mobility, lose  25-30lbs    Intervention  Attend PR 2 x week and supplement at home exercise 3 x week.    Expected Outcomes  Reach personal goals.        Personal Goals Discharge: Goals and Risk Factor Review    Row Name 07/31/17 1337 08/28/17 1521 09/18/17 1502 10/10/17 1349 11/05/17 1503     Core Components/Risk Factors/Patient Goals Review   Personal Goals Review  Weight Management/Obesity;Increase knowledge of respiratory medications and ability to use respiratory devices properly.;Improve shortness of breath with ADL's Better mobility; lose weight 25-30 lbs;  Do yard work.   Weight Management/Obesity;Increase knowledge of respiratory medications and ability to use respiratory devices properly.;Improve shortness of breath with ADL's Better mobility; lose weight 25-30 lbs. Do yard work again.   Weight Management/Obesity;Increase knowledge of respiratory medications and ability to use respiratory devices properly.;Improve shortness of breath with ADL's Better mobility; lose weight 25-30 lbs; Do yard work again.   Weight Management/Obesity;Increase knowledge of respiratory medications and ability to use respiratory devices properly.;Improve shortness of breath with ADL's Better mobility; lose weight 25-30 lbs; do yard work again.   Weight Management/Obesity;Increase knowledge of respiratory medications and ability to use respiratory devices properly.;Improve shortness of breath with ADL's Better mobility; lose weight 25-30 lbs; Do yard work again.    Review  Patient has completed 7 sessions losing 3.5 lbs. She is doing well in the program with some progression. She says it is too soon to tell if the program is making a difference. She hopes to meet her goals as she continues the program. Will continue to monitor for progress.   Patient has completed 15 sessions losing 4 lbs. She continues to do well in the program with progression. She says the program is helping her. She feels stronger and has more endurance to do  things. She says she feels like she could do yard work but is taking a medication that does no allow to get out in the sun or sweat excessively. She is afraid to try. She is very pleased with her progress. Will continue to monitor for progress.   Patient has completed 21 sessions gaining 3 lbs since last 30 day review. She continues to do well in the program with progression. She continues to say the program  is helping her. She says she has more endurance and more energy to do things. She says she feels like she has gained some strength since last 30 day review. Will continue to monitor for progress.   Patient has completed 27 sessions gaining 4 lbs since last 30 day review. She blames weight gain on fluid. She continues to do well in the program with progression. She says she is continuing to get stronger and has more stamina. She says she feels she has gained more mobility in her upper extremtities and feels her balance has improved with better walking. She is very pleased with her progress. Will continue to monitor.   Patient has completed 33 sessions gaining 4 lbs since last 30 day review. She continues to do well in the program with progression. She says she continues to feel stronger with increased energy. She does feel more flexible in her upper extremtities and feels she is walking better with improved balance. Will continue to monitor for progress.    Expected Outcomes  Patient will continue to attend sessions and complete the program.   Patient will continue to attend sessions and complete the program.   Patient will continue to attend sessions and complete the program.   Patient will continue to attend sessions and complete the program.   Patient will continue to attend sessions and complete the program.    [place] Name 11/20/17 1616             Core Components/Risk Factors/Patient Goals Review   Personal Goals Review  Weight Management/Obesity;Increase knowledge of respiratory medications and  ability to use respiratory devices properly.;Improve shortness of breath with ADL's Better mobility; lose 25 lbs.        Review  Patient graduated with 36 sessions losing 2 lbs overall. She did well in the program stating she feels a lot better overall both mentally and physically. She feels stronger and has more stamina. She also says her mobility and flexibility has improved. Her Medficts score improved by 56. Her grip strength increased. Her waist circumference decreased by 0.5 inches. Her exit walk test improved by 21%. Her pulmonary assessment scores also improved. She says she is breathing better with less SOB with activities. She plans on joining our [employer] program to continue exercising.        Expected Outcomes  Patient will join our [employer] program and continue exercising and continue to meet her personal goals.           Exercise Goals and Review: Exercise Goals    Row Name 07/01/17 1313             Exercise Goals   Increase Physical Activity  Yes       Intervention  Provide advice, education, support and counseling about physical activity/exercise needs.;Develop an individualized exercise prescription for aerobic and resistive training based on initial evaluation findings, risk stratification, comorbidities and participant's personal goals.       Expected Outcomes  Short Term: Attend rehab on a regular basis to increase amount of physical activity.       Increase Strength and Stamina  Yes       Intervention  Develop an individualized exercise prescription for aerobic and resistive training based on initial evaluation findings, risk stratification, comorbidities and participant's personal goals.;Provide advice, education, support and counseling about physical activity/exercise needs.       Expected Outcomes  Short Term: Increase workloads from initial exercise prescription for resistance, speed, and METs.  Able to understand and use rate of  perceived exertion (RPE) scale  Yes       Intervention  Provide education and explanation on how to use RPE scale       Expected Outcomes  Short Term: Able to use RPE daily in rehab to express subjective intensity level;Long Term:  Able to use RPE to guide intensity level when exercising independently       Able to understand and use Dyspnea scale  Yes       Intervention  Provide education and explanation on how to use Dyspnea scale       Expected Outcomes  Short Term: Able to use Dyspnea scale daily in rehab to express subjective sense of shortness of breath during exertion;Long Term: Able to use Dyspnea scale to guide intensity level when exercising independently       Knowledge and understanding of Target Heart Rate Range (THRR)  Yes       Intervention  Provide education and explanation of THRR including how the numbers were predicted and where they are located for reference       Expected Outcomes  Short Term: Able to state/look up THRR;Long Term: Able to use THRR to govern intensity when exercising independently;Short Term: Able to use daily as guideline for intensity in rehab       Able to check pulse independently  Yes       Intervention  Provide education and demonstration on how to check pulse in carotid and radial arteries.;Review the importance of being able to check your own pulse for safety during independent exercise       Expected Outcomes  Short Term: Able to explain why pulse checking is important during independent exercise;Long Term: Able to check pulse independently and accurately       Understanding of Exercise Prescription  Yes       Intervention  Provide education, explanation, and written materials on patient's individual exercise prescription       Expected Outcomes  Short Term: Able to explain program exercise prescription;Long Term: Able to explain home exercise prescription to exercise independently          Nutrition & Weight - Outcomes: Pre Biometrics - 07/01/17 1313       Pre Biometrics   Height  '5\' 5"'  (1.651 m)    Weight  214 lb 1.6 oz (97.1 kg)    Waist Circumference  48.5 inches    Hip Circumference  47 inches    Waist to Hip Ratio  1.03 %    BMI (Calculated)  35.63    Triceps Skinfold  26 mm    % Body Fat  47.6 %    Grip Strength  28.93 kg    Flexibility  0 in    Single Leg Stand  0 seconds      Post Biometrics - 11/20/17 1450       Post  Biometrics   Height  '5\' 5"'  (1.651 m)    Weight  212 lb (96.2 kg)    Waist Circumference  48.5 inches    Hip Circumference  47 inches    Waist to Hip Ratio  1.03 %    BMI (Calculated)  35.28    Triceps Skinfold  26 mm    % Body Fat  46.9 %    Grip Strength  20.64 kg    Flexibility  0 in    Single Leg Stand  0 seconds       Nutrition:  Nutrition Therapy & Goals - 07/31/17 1526      Personal Nutrition Goals   Nutrition Goal  For heart healthy choices add >50% of whole grains, make half their plate fruits and vegetables. Discuss the difference between starchy vegetables and leafy greens, and how leafy vegetables provide fiber, helps maintain healthy weight, helps control blood glucose, and lowers cholesterol.  Discuss purchasing fresh or frozen vegetable to reduce sodium and not to add grease, fat or sugar. Consume <18oz of red meat per week. Consume lean cuts of meats and very little of meats high in sodium and nitrates such as pork and lunch meats. Discussed portion control for all food groups.      Additional Goals?  No    Comments  Patient met with RD 07/31/17.      Intervention Plan   Intervention  Nutrition handout(s) given to patient.    Expected Outcomes  Short Term Goal: A plan has been developed with personal nutrition goals set during dietitian appointment.;Long Term Goal: Adherence to prescribed nutrition plan.       Nutrition Discharge: Nutrition Assessments - 11/20/17 1602      MEDFICTS Scores   Pre Score  62    Post Score  6    Score Difference  -56       Education  Questionnaire Score: Knowledge Questionnaire Score - 11/20/17 1605      Knowledge Questionnaire Score   Pre Score  15/18    Post Score  16/18       Goals reviewed with patient; copy given to patient.

## 2017-11-25 DIAGNOSIS — E119 Type 2 diabetes mellitus without complications: Secondary | ICD-10-CM | POA: Diagnosis not present

## 2017-11-25 DIAGNOSIS — I10 Essential (primary) hypertension: Secondary | ICD-10-CM | POA: Diagnosis not present

## 2017-11-25 DIAGNOSIS — E1169 Type 2 diabetes mellitus with other specified complication: Secondary | ICD-10-CM | POA: Diagnosis not present

## 2017-11-25 DIAGNOSIS — E785 Hyperlipidemia, unspecified: Secondary | ICD-10-CM | POA: Diagnosis not present

## 2017-11-27 DIAGNOSIS — E785 Hyperlipidemia, unspecified: Secondary | ICD-10-CM | POA: Diagnosis not present

## 2017-11-27 DIAGNOSIS — G7000 Myasthenia gravis without (acute) exacerbation: Secondary | ICD-10-CM | POA: Diagnosis not present

## 2017-11-27 DIAGNOSIS — E669 Obesity, unspecified: Secondary | ICD-10-CM | POA: Diagnosis not present

## 2017-11-27 DIAGNOSIS — E1159 Type 2 diabetes mellitus with other circulatory complications: Secondary | ICD-10-CM | POA: Diagnosis not present

## 2017-11-27 DIAGNOSIS — E1169 Type 2 diabetes mellitus with other specified complication: Secondary | ICD-10-CM | POA: Diagnosis not present

## 2017-11-27 DIAGNOSIS — I10 Essential (primary) hypertension: Secondary | ICD-10-CM | POA: Diagnosis not present

## 2017-11-27 DIAGNOSIS — E1165 Type 2 diabetes mellitus with hyperglycemia: Secondary | ICD-10-CM | POA: Diagnosis not present

## 2017-11-27 DIAGNOSIS — E1142 Type 2 diabetes mellitus with diabetic polyneuropathy: Secondary | ICD-10-CM | POA: Diagnosis not present

## 2017-11-27 DIAGNOSIS — Z6834 Body mass index (BMI) 34.0-34.9, adult: Secondary | ICD-10-CM | POA: Diagnosis not present

## 2017-11-27 DIAGNOSIS — J849 Interstitial pulmonary disease, unspecified: Secondary | ICD-10-CM | POA: Diagnosis not present

## 2017-12-04 DIAGNOSIS — Z124 Encounter for screening for malignant neoplasm of cervix: Secondary | ICD-10-CM | POA: Diagnosis not present

## 2017-12-04 DIAGNOSIS — Z1231 Encounter for screening mammogram for malignant neoplasm of breast: Secondary | ICD-10-CM | POA: Diagnosis not present

## 2017-12-04 DIAGNOSIS — Z6837 Body mass index (BMI) 37.0-37.9, adult: Secondary | ICD-10-CM | POA: Diagnosis not present

## 2017-12-05 DIAGNOSIS — D485 Neoplasm of uncertain behavior of skin: Secondary | ICD-10-CM | POA: Diagnosis not present

## 2017-12-05 DIAGNOSIS — L570 Actinic keratosis: Secondary | ICD-10-CM | POA: Diagnosis not present

## 2018-01-19 NOTE — Progress Notes (Signed)
Office Visit Note  Patient: [name]             Date of Birth: 1945-07-25           MRN: [id number]             PCP: Dione Housekeeper, MD Referring: Dione Housekeeper, MD Visit Date: 01/30/2018 Occupation: @GUAROCC @  Subjective:  Lower back pain.   History of Present Illness: [name] is a 72 y.o. female history of interstitial lung disease and osteoarthritis.  She states she is not having much joint discomfort.  She does not have any joint swelling.  She states she had CT scan yesterday which bothered her right shoulder.  Her neck and lower back pain is tolerable.  She does not have much discomfort in her knee joints.  She continues to have shortness of breath.  Activities of Daily Living:  Patient reports morning stiffness for 5 minute.   Patient Denies nocturnal pain.  Difficulty dressing/grooming: Denies Difficulty climbing stairs: Reports Difficulty getting out of chair: Denies Difficulty using hands for taps, buttons, cutlery, and/or writing: Denies  Review of Systems  Constitutional: Positive for fatigue. Negative for night sweats, weight gain and weight loss.  HENT: Positive for mouth dryness. Negative for mouth sores, trouble swallowing, trouble swallowing and nose dryness.   Eyes: Negative for pain, redness, visual disturbance and dryness.  Respiratory: Positive for shortness of breath. Negative for cough and difficulty breathing.   Cardiovascular: Negative for chest pain, palpitations, hypertension, irregular heartbeat and swelling in legs/feet.  Gastrointestinal: Negative for blood in stool, constipation and diarrhea.  Endocrine: Negative for increased urination.  Genitourinary: Negative for vaginal dryness.  Musculoskeletal: Positive for morning stiffness. Negative for arthralgias, joint pain, joint swelling, myalgias, muscle weakness, muscle tenderness and myalgias.  Skin: Negative for color change, rash, hair loss, skin tightness, ulcers and  sensitivity to sunlight.  Allergic/Immunologic: Negative for susceptible to infections.  Neurological: Negative for dizziness, memory loss, night sweats and weakness.  Hematological: Negative for swollen glands.  Psychiatric/Behavioral: Negative for depressed mood and sleep disturbance. The patient is not nervous/anxious.     PMFS History:  Patient Active Problem List   Diagnosis Date Noted  . Positive anti-CCP test 12/03/2016  . Coronary artery calcification seen on CAT scan 12/03/2016  . ILD (interstitial lung disease) ([place]) 06/04/2016  . Dyspnea and respiratory abnormality 06/04/2016  . Primary osteoarthritis of both hands 05/01/2016  . Primary osteoarthritis of both knees 05/01/2016  . DDD (degenerative disc disease), lumbar 05/01/2016  . Trochanteric bursitis of both hips 05/01/2016  . 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 History of sleep apnea 05/01/2016  . History of hypertension 05/01/2016  . 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 Dyslipidemia 05/01/2016  . 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 Arthritis   . Bursitis   . Diabetes mellitus without complication ([place])   . Fibromyalgia   . GERD (gastroesophageal reflux disease)   . Hyperlipemia   . Hypertension   . Sinusitis   . Sleep apnea    uses a cpap  . Sleep apnea   . Wears glasses     Family History  Problem Relation Age of Onset  . Emphysema Father   . Intracerebral hemorrhage Sister    Past Surgical History:  Procedure Laterality Date  . ABDOMINAL HYSTERECTOMY  1999  . BREAST SURGERY  2002   rt br bx  . CERVICAL FUSION  2007  .  CHOLECYSTECTOMY  2005   lap choli  . COLONOSCOPY    . FINGER GANGLION CYST EXCISION    . SHOULDER ARTHROSCOPY WITH OPEN ROTATOR CUFF REPAIR AND DISTAL CLAVICLE ACROMINECTOMY Right 09/09/2012   Procedure: Right Shoulder Arthroscopy with distal clavicle excision and mini-open rotator cuff repair.;  Surgeon: Alta Corning, MD;   Location: [facility];  Service: Orthopedics;  Laterality: Right;  . TONSILLECTOMY     Social History   Social History Narrative  . Not on file    Objective: Vital Signs: BP 113/65 (BP Location: Left Arm, Patient Position: Sitting, Cuff Size: Large)   Pulse 71   Resp 14   Ht 5\' 4"  (1.626 m)   Wt 206 lb 9.6 oz (93.7 kg)   BMI 35.46 kg/m    Physical Exam  Constitutional: She is oriented to person, place, and time. She appears well-developed and well-nourished.  HENT:  Head: Normocephalic and atraumatic.  Eyes: Conjunctivae and EOM are normal.  Neck: Normal range of motion.  Cardiovascular: Normal rate, regular rhythm, normal heart sounds and intact distal pulses.  Pulmonary/Chest: Effort normal. She has rales.  Abdominal: Soft. Bowel sounds are normal.  Lymphadenopathy:    She has no cervical adenopathy.  Neurological: She is alert and oriented to person, place, and time.  Skin: Skin is warm and dry. Capillary refill takes less than 2 seconds.  Psychiatric: She has a normal mood and affect. Her behavior is normal.  Nursing note and vitals reviewed.    Musculoskeletal Exam: C-spine thoracic lumbar spine limited range of motion.  Shoulder joints elbow joints wrist joints with good range of motion.  She has DIP and thickening bilaterally consistent with osteoarthritis.  No synovitis was noted.  Knee joints were in good range of motion.  She has right ankle joint brace.  No synovitis was noted over ankle joints.  CDAI Exam: CDAI Score: Not documented Patient Global Assessment: Not documented; Provider Global Assessment: Not documented Swollen: Not documented; Tender: Not documented Joint Exam   Not documented   There is currently no information documented on the homunculus. Go to the Rheumatology activity and complete the homunculus joint exam.  Investigation: No additional findings.  Imaging: Ct Chest High Resolution  Result Date: 01/30/2018 CLINICAL  DATA:  Follow-up interstitial lung disease. Chronic cough and dyspnea. EXAM: CT CHEST WITHOUT CONTRAST TECHNIQUE: Multidetector CT imaging of the chest was performed following the standard protocol without intravenous contrast. High resolution imaging of the lungs, as well as inspiratory and expiratory imaging, was performed. COMPARISON:  11/29/2016 high-resolution chest CT. FINDINGS: Cardiovascular: Normal heart size. No significant pericardial effusion/thickening. Atherosclerotic nonaneurysmal thoracic aorta. Normal caliber pulmonary arteries. Three-vessel coronary atherosclerosis. Mediastinum/Nodes: No discrete thyroid nodules. Unremarkable esophagus. No pathologically enlarged axillary, mediastinal or hilar lymph nodes, noting limited sensitivity for the detection of hilar adenopathy on this noncontrast study. Lungs/Pleura: No pneumothorax. No pleural effusion. No acute consolidative airspace disease, lung masses or significant pulmonary nodules. There is mild patchy subpleural reticulation and ground-glass attenuation in both lungs with associated mild traction bronchiectasis. There is a basilar gradient to these findings. No frank honeycombing. Mild patchy air trapping in both lungs on the expiration sequence. Interval stability of these findings. Upper abdomen: Cholecystectomy. Musculoskeletal: No aggressive appearing focal osseous lesions. Marked thoracic spondylosis. Partially visualized surgical hardware from ACDF in the lower cervical spine. IMPRESSION: 1. Continued stability of basilar predominant fibrotic interstitial lung disease without frank honeycombing. Findings are considered most compatible with an alternative diagnosis to UIP  per ATS consensus criteria. 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 2. Mild patchy air trapping in both lungs, indicative of small airways disease. 3. Three-vessel coronary atherosclerosis. Aortic Atherosclerosis (ICD10-I70.0). Electronically  Signed   By: Ilona Sorrel M.D.   On: 01/30/2018 08:07    Recent Labs: Lab Results  Component Value Date   HGB 12.9 09/09/2012   NA 139 09/08/2012   K 3.4 (L) 09/08/2012   CL 100 09/08/2012   CO2 29 09/08/2012   GLUCOSE 144 (H) 09/08/2012   BUN 20 09/08/2012   CREATININE 0.67 09/08/2012   CALCIUM 9.9 09/08/2012   GFRAA >90 09/08/2012    Speciality Comments: No specialty comments available.  Procedures:  No procedures performed Allergies: Latex; Celebrex [celecoxib]; and Vioxx [rofecoxib]   Assessment / Plan:     Visit Diagnoses: ILD (interstitial lung disease) ([place]) - Anti-CCP antibody 109, RF negative, ANA negative, ENA negative, pan ANCA negative, mild and stable disease followed up by Dr. Chase Caller.  Patient has been participating in pulmonary rehab now.  She will follow-up with Dr. Chase Caller regarding possible lung biopsy.  I have advised her to return in case she develops any joint swelling.  Primary osteoarthritis of both hands-she has some osteoarthritic changes but no synovitis on examination.  Primary osteoarthritis of both knees-she denies any discomfort in her knee joints.  But she has difficulty climbing the stairs.  No warmth swelling or effusion was noted.  DDD (degenerative disc disease), cervical-she is not having much discomfort currently.  DDD (degenerative disc disease), lumbar-she has intermittent discomfort in her lower back.  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  No orders of the defined types were placed in this encounter.     Follow-Up Instructions: Return if symptoms worsen or fail to improve, for ILD.   Bo Merino, MD  Note - This record has been created using Editor, commissioning.  Chart creation errors have been sought, but may not always    have been located. Such creation errors do not reflect on  the standard of medical care.

## 2018-01-22 DIAGNOSIS — E1159 Type 2 diabetes mellitus with other circulatory complications: Secondary | ICD-10-CM | POA: Diagnosis not present

## 2018-01-22 DIAGNOSIS — H5213 Myopia, bilateral: Secondary | ICD-10-CM | POA: Diagnosis not present

## 2018-01-22 DIAGNOSIS — E119 Type 2 diabetes mellitus without complications: Secondary | ICD-10-CM | POA: Diagnosis not present

## 2018-01-22 DIAGNOSIS — H02831 Dermatochalasis of right upper eyelid: Secondary | ICD-10-CM | POA: Diagnosis not present

## 2018-01-22 DIAGNOSIS — H2513 Age-related nuclear cataract, bilateral: Secondary | ICD-10-CM | POA: Diagnosis not present

## 2018-01-22 DIAGNOSIS — I10 Essential (primary) hypertension: Secondary | ICD-10-CM | POA: Diagnosis not present

## 2018-01-27 DIAGNOSIS — E1159 Type 2 diabetes mellitus with other circulatory complications: Secondary | ICD-10-CM | POA: Diagnosis not present

## 2018-01-27 DIAGNOSIS — J849 Interstitial pulmonary disease, unspecified: Secondary | ICD-10-CM | POA: Diagnosis not present

## 2018-01-27 DIAGNOSIS — Z6834 Body mass index (BMI) 34.0-34.9, adult: Secondary | ICD-10-CM | POA: Diagnosis not present

## 2018-01-27 DIAGNOSIS — E785 Hyperlipidemia, unspecified: Secondary | ICD-10-CM | POA: Diagnosis not present

## 2018-01-27 DIAGNOSIS — E1142 Type 2 diabetes mellitus with diabetic polyneuropathy: Secondary | ICD-10-CM | POA: Diagnosis not present

## 2018-01-27 DIAGNOSIS — R1310 Dysphagia, unspecified: Secondary | ICD-10-CM | POA: Diagnosis not present

## 2018-01-27 DIAGNOSIS — Z23 Encounter for immunization: Secondary | ICD-10-CM | POA: Diagnosis not present

## 2018-01-27 DIAGNOSIS — I10 Essential (primary) hypertension: Secondary | ICD-10-CM | POA: Diagnosis not present

## 2018-01-27 DIAGNOSIS — G7000 Myasthenia gravis without (acute) exacerbation: Secondary | ICD-10-CM | POA: Diagnosis not present

## 2018-01-27 DIAGNOSIS — E669 Obesity, unspecified: Secondary | ICD-10-CM | POA: Diagnosis not present

## 2018-01-27 DIAGNOSIS — E1169 Type 2 diabetes mellitus with other specified complication: Secondary | ICD-10-CM | POA: Diagnosis not present

## 2018-01-29 ENCOUNTER — Ambulatory Visit (INDEPENDENT_AMBULATORY_CARE_PROVIDER_SITE_OTHER)
Admission: RE | Admit: 2018-01-29 | Discharge: 2018-01-29 | Disposition: A | Discharge status: Home / Self Care | Payer: PPO | Source: Ambulatory Visit | Attending: Internal Medicine | Admitting: Internal Medicine

## 2018-01-29 DIAGNOSIS — J849 Interstitial pulmonary disease, unspecified: Secondary | ICD-10-CM | POA: Diagnosis not present

## 2018-01-30 ENCOUNTER — Encounter: Payer: Self-pay | Admitting: Rheumatology

## 2018-01-30 ENCOUNTER — Ambulatory Visit: Payer: Medicare Other | Admitting: Rheumatology

## 2018-01-30 VITALS — BP 113/65 | HR 71 | Resp 14 | Ht 64.0 in | Wt 206.6 lb

## 2018-01-30 DIAGNOSIS — I2510 Atherosclerotic heart disease of native coronary artery without angina pectoris: Secondary | ICD-10-CM | POA: Diagnosis not present

## 2018-01-30 DIAGNOSIS — E785 Hyperlipidemia, unspecified: Secondary | ICD-10-CM

## 2018-01-30 DIAGNOSIS — M51369 Other intervertebral disc degeneration, lumbar region without mention of lumbar back pain or lower extremity pain: Secondary | ICD-10-CM

## 2018-01-30 DIAGNOSIS — J849 Interstitial pulmonary disease, unspecified: Secondary | ICD-10-CM

## 2018-01-30 DIAGNOSIS — M5136 Other intervertebral disc degeneration, lumbar region: Secondary | ICD-10-CM

## 2018-01-30 DIAGNOSIS — G7000 Myasthenia gravis without (acute) exacerbation: Secondary | ICD-10-CM | POA: Diagnosis not present

## 2018-01-30 DIAGNOSIS — Z8679 Personal history of other diseases of the circulatory system: Secondary | ICD-10-CM | POA: Diagnosis not present

## 2018-01-30 DIAGNOSIS — M170 Bilateral primary osteoarthritis of knee: Secondary | ICD-10-CM | POA: Diagnosis not present

## 2018-01-30 DIAGNOSIS — M19042 Primary osteoarthritis, left hand: Secondary | ICD-10-CM

## 2018-01-30 DIAGNOSIS — M19041 Primary osteoarthritis, right hand: Secondary | ICD-10-CM | POA: Diagnosis not present

## 2018-01-30 DIAGNOSIS — M5030 Other cervical disc degeneration, unspecified cervical region: Secondary | ICD-10-CM | POA: Diagnosis not present

## 2018-02-03 ENCOUNTER — Other Ambulatory Visit: Payer: Self-pay | Admitting: Gastroenterology

## 2018-02-03 DIAGNOSIS — Z800 Family history of malignant neoplasm of digestive organs: Secondary | ICD-10-CM | POA: Diagnosis not present

## 2018-02-03 DIAGNOSIS — R1310 Dysphagia, unspecified: Secondary | ICD-10-CM | POA: Diagnosis not present

## 2018-02-03 DIAGNOSIS — K5900 Constipation, unspecified: Secondary | ICD-10-CM | POA: Diagnosis not present

## 2018-02-03 DIAGNOSIS — K219 Gastro-esophageal reflux disease without esophagitis: Secondary | ICD-10-CM | POA: Diagnosis not present

## 2018-02-05 DIAGNOSIS — L570 Actinic keratosis: Secondary | ICD-10-CM | POA: Diagnosis not present

## 2018-02-10 ENCOUNTER — Ambulatory Visit
Admission: RE | Admit: 2018-02-10 | Discharge: 2018-02-10 | Disposition: A | Discharge status: Home / Self Care | Payer: PPO | Source: Ambulatory Visit | Attending: Gastroenterology | Admitting: Gastroenterology

## 2018-02-10 DIAGNOSIS — R1310 Dysphagia, unspecified: Secondary | ICD-10-CM

## 2018-02-24 DIAGNOSIS — I739 Peripheral vascular disease, unspecified: Secondary | ICD-10-CM | POA: Diagnosis not present

## 2018-02-24 DIAGNOSIS — I517 Cardiomegaly: Secondary | ICD-10-CM | POA: Diagnosis not present

## 2018-02-24 DIAGNOSIS — I10 Essential (primary) hypertension: Secondary | ICD-10-CM | POA: Diagnosis not present

## 2018-02-24 DIAGNOSIS — R9431 Abnormal electrocardiogram [ECG] [EKG]: Secondary | ICD-10-CM | POA: Diagnosis not present

## 2018-02-24 DIAGNOSIS — I119 Hypertensive heart disease without heart failure: Secondary | ICD-10-CM | POA: Diagnosis not present

## 2018-02-24 DIAGNOSIS — R0602 Shortness of breath: Secondary | ICD-10-CM | POA: Diagnosis not present

## 2018-02-24 DIAGNOSIS — I340 Nonrheumatic mitral (valve) insufficiency: Secondary | ICD-10-CM | POA: Diagnosis not present

## 2018-02-24 DIAGNOSIS — I6529 Occlusion and stenosis of unspecified carotid artery: Secondary | ICD-10-CM | POA: Diagnosis not present

## 2018-02-26 DIAGNOSIS — E119 Type 2 diabetes mellitus without complications: Secondary | ICD-10-CM | POA: Diagnosis not present

## 2018-02-26 DIAGNOSIS — I10 Essential (primary) hypertension: Secondary | ICD-10-CM | POA: Diagnosis not present

## 2018-02-26 DIAGNOSIS — I119 Hypertensive heart disease without heart failure: Secondary | ICD-10-CM | POA: Diagnosis not present

## 2018-02-26 DIAGNOSIS — G4733 Obstructive sleep apnea (adult) (pediatric): Secondary | ICD-10-CM | POA: Diagnosis not present

## 2018-03-03 ENCOUNTER — Other Ambulatory Visit (INDEPENDENT_AMBULATORY_CARE_PROVIDER_SITE_OTHER): Payer: PPO

## 2018-03-03 ENCOUNTER — Encounter: Payer: Self-pay | Admitting: Internal Medicine

## 2018-03-03 ENCOUNTER — Ambulatory Visit: Payer: PPO | Admitting: Internal Medicine

## 2018-03-03 VITALS — BP 122/66 | HR 68 | Ht 64.0 in | Wt 205.0 lb

## 2018-03-03 DIAGNOSIS — R768 Other specified abnormal immunological findings in serum: Secondary | ICD-10-CM

## 2018-03-03 DIAGNOSIS — J849 Interstitial pulmonary disease, unspecified: Secondary | ICD-10-CM

## 2018-03-03 LAB — CARDIAC PANEL
CK MB: 2.4 ng/mL (ref 0.3–4.0)
CK TOTAL: 114 U/L (ref 7–177)
RELATIVE INDEX: 2.1 calc (ref 0.0–2.5)

## 2018-03-03 LAB — SEDIMENTATION RATE: SED RATE: 2 mm/h (ref 0–30)

## 2018-03-03 NOTE — Patient Instructions (Addendum)
ICD-10-CM   1. ILD (interstitial lung disease) ([place]) J84.9   2. 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 Other possibilities are NSIP (females in your age group)  or a variety called HP or IPF (idiopathic)  Plan - check additionla blood work for ILD cause -  hypersennsitivity pneumonitis panel, myositis panel, lupus anticoagulant panel, ESR - if these are negative then can refer to Dr Roxan Hockey for surgical lung biopsy - keeping in mind your Myasthenia  Followup 3 months do spirometry and DLCOL 3 months in ILD clinic

## 2018-03-03 NOTE — Progress Notes (Signed)
IOV 06/04/2016  Chief Complaint  Patient presents with  . Advice Only    referred by Dr. Estanislado Pandy for ILD, abn ct.     72 year old female referred by Dr. Keturah Barre in the rheumatology clinic for evaluation of dyspnea in the setting of positive CCP and CT chest suggestive of ILD  History is taken from her and review of the outside chart of rheumatology and neurology January 2018  She is significantly obese although this is been stable for the last several years. Several years ago she developed knee issues and is wearing a brace/shoulder and her right lower extremity. Since then she's been less mobile and less active. Corresponding with this she's had insidious onset of dyspnea on exertion for climbing stairs and other activities that are class II-III nature. This dyspnea has been stable all along without any associated weight change. It is present with exertion relieved by rest. Is known orthopnea paroxysmal nocturnal dyspnea. She's had occasional associated dry cough but this is not consistent in the same with wheezing which is not consistent. There is no associated chest pain.  According to her history she's been seeing Dr. 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 According to her and review of the chart this is localized only to the ocular muscles she is on pyridostigmine. At this time a high-resolution CT chest was done 04/27/2016 that I personally visualized that shows coronary artery calcification which is unaware of and also mild subtle ILD changes. She subsequently saw Dr. Blake Divine January 2018 and autoimmune profile showed elevated CCP that was isolated but no clinical or systemic evidence of rheumatoid arthritis. The diagnoses on the joints is still osteoarthritis. Therefore she's been referred here. There is no associated chest pain     review of the chart shows she's not had an echocardiogram a  cardiac stress test btu she tells me she has one at [facility] with her cardioloigist 2 years ago was normal  Walking desaturation test 185 feet 3 laps on room air: Did not desaturate.  Results for ANGELICIA, LESSNER (MRN [id number]) as of 06/04/2016 15:47  Ref. Range 01/15/2016 13:00  Acetylchol Block Ab Latest Ref Range: 0 - 25 % 31 (H)  Acety choline binding ab Latest Ref Range: 0.00 - 0.24 nmol/L 6.16 (H)  Acetylcholine Modulat Ab Latest Ref Range: 0 - 20 % 48 (H)   Results for CAPITOLA, LADSON (MRN [id number]) as of 06/04/2016 15:47  Ref. Range 05/02/2016 10:00  CK Total Latest Ref Range: 7 - 177 U/L 99  Sed Rate Latest Ref Range: 0 - 30 mm/hr 4  Anit Nuclear Antibody(ANA) Latest Ref Range: NEGATIVE  NEG  Angiotensin-Converting Enzyme Latest Ref Range: 9 - 67 U/L 34  Cyclic Citrullin Peptide Ab Latest Units: Units 109 (H)  ds DNA Ab Latest Units: IU/mL <1  Myeloperoxidase Abs Latest Ref Range: <1.0 AI  <1.0  Serine Protease 3 Latest Ref Range: <1.0 AI  <1.0  RA Latex Turbid. Latest Ref Range: <14 IU/mL <14  ENA SM Ab Ser-aCnc Latest Ref Range: <1.0 NEG AI  <1.0 NEG  Ribonucleic Protein(ENA) Antibody, IgG Latest Ref Range: <1.0 NEG AI  <1.0 NEG  SSA (Ro) (ENA) Antibody, IgG Latest Ref Range: <1.0 NEG AI  <1.0 NEG  SSB (La) (ENA) Antibody, IgG Latest Ref Range: <1.0 NEG AI  <1.0 NEG  Scleroderma (Scl-70) (ENA) Antibody, IgG Latest Ref Range: <  1.0 NEG AI  <1.0 NEG      OV 12/03/2016  Chief Complaint  Patient presents with  . Follow-up    Pt states her breathing is unchanged since last OV. Pt denies significant cough, CP/tightness, f/c/s.    Follow-up shortness of breath that is multifactorial but associated with interstitial lung disease on CT scan of the chest December 2017 associated with positive CCP antibody and a diagnosis of osteoarthritis but not rheumatoid arthritis in the joints  She continues to be stable. There are no interim issues no chest pain. She only has mild shortness of  breath with exertion. Her ocular myasthenia is improved. Pulmonary function test shows mild reduction in isolated diffusion capacity 74%. Forced vital capacity and lung volumes are normal. High-resolution CT chest shows subtle ILD in the bases non-UIP pattern.  CT chest high resolution IMPRESSION: 1. 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 2. Aortic atherosclerosis, in addition to left main and 3 vessel coronary artery disease. Please note that although the presence of coronary artery calcium documents the presence of coronary artery disease, the severity of this disease and any potential stenosis cannot be assessed on this non-gated CT examination. Assessment for potential risk factor modification, dietary therapy or pharmacologic therapy may be warranted, if clinically indicated. 3. Mild hepatic steatosis. 4. Additional incidental findings, as above.  Aortic Atherosclerosis (ICD10-I70.0).   Electronically Signed   By: Vinnie Langton M.D.   On: 11/29/2016 10:15    OV 06/03/2017   Chief Complaint  Patient presents with  . Follow-up    PFT done today.  Pt states her breathing is about the same as last visit.  Pt recently had a sinus infection but is not currently on any meds. Has complaints of a cough with green mucus. Denies any CP.    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  In the setting of myasthenia gravis  Follow-up coronary artery calcification  Last visit August 2018.  Overall stable since then.  She reports significant dyspnea when climbing stairs but otherwise doing well.  Overall stable.  And walking desaturation test she got very tachycardic even though she did not desaturate.  She is known to have  coronary artery calcification and I referred her to cardiology but today she tells me that she follows for this at [facility] with Dr. Claudie Leach and her stress test is upcoming.  She has never been to pulmonary rehabilitation.  There is no associated chest pain or cough.  Follow pulmonary function test shows continued stability but perhaps reduction in DLCO.  On exam she has no crackles.     Walking desaturation test on 06/03/2017 185 feet x 3 laps on ROOM AIR:  did NOT desaturate. Rest pulse ox was 100%, final pulse ox was 99%. HR response 75/min at rest to 112/min at peak exertion. Patient [name]  Did ot Desaturate < 88% . Daleen Snook did not  Desaturated </= 3% points. Daleen Snook yes did get tachyardic   OV 03/03/2018  Subjective:  Patient ID: HAVANA BALDWIN, female , DOB: 08-12-1945 , age 39 y.o. , MRN: [id number] , ADDRESS: [place] [address]   03/03/2018 -   Chief Complaint  Patient presents with  . Follow-up    pt c/o  sob with exertion.  denies cough, chest congestion.     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  In the setting of myasthenia gravis  Also coronary arter calcification  -     HPI DELOYCE WALTHERS 72 y.o. -returns for follow-up of ILD not otherwise specified.  After last visit she did see her cardiologist.  According to her there is no discussion about the coronary artery calcification but she recollects a normal stress test with the last few to several years.  She started attending pulmonary rehabilitation for the last few to several months.  She is been doing well with this and improved dyspnea.  Overall she is stable.  She had high-resolution CT chest in October 2019 that is unchanged from February 2018.  The pattern is described as either indeterminate or alternative diagnosis.   This seems to be some craniocaudal gradient with reticulations and traction bronchiectasis.  However there is some air trapping as well.  She is attending pulmonary rehabilitation and this is improved her dyspnea.  However the etiology of her ILD still unknown.  She did see Dr. D rheumatologist January 30, 2018 and has been advised that she has osteoarthritis based on my chart review.  She is on as needed follow-up.  Patient is wondering about the etiology of her ILD and is wondering about the safety of surgical lung biopsy.  Did a history retake and she did admit to having RAYNAUD phenomenon mild in the last few winters of her fingers.  T    Results for MOKSHA, DORGAN (MRN [id number]) as of 06/03/2017 11:52  Ref. Range 12/03/2016 09:51 06/03/2017 10:48  FVC-Pre Latest Units: L 2.66 2.66  FVC-%Pred-Pre Latest Units: % 92 93   Results for ZAHARAH, AMIR (MRN [id number]) as of 06/03/2017 11:52  Ref. Range 12/03/2016 09:51 06/03/2017 10:48  DLCO unc Latest Units: ml/min/mmHg 17.17 16.35  DLCO unc % pred Latest Units: % 74 71     Simple office walk 185 feet x  3 laps goal with forehead probe 03/03/2018   O2 used Room air  Number laps completed 3  Comments about pace normal  Resting Pulse Ox/HR 100% and 66/min  Final Pulse Ox/HR 99% and 108/min  Desaturated </= 88% no  Desaturated <= 3% points no  Got Tachycardic >/= 90/min yes  Symptoms at end of test x  Miscellaneous comments x     HRCT 01/30/18 - [place]:  11/29/2016 high-resolution chest CT.  FINDINGS: Lungs/Pleura: No pneumothorax. No pleural effusion. No acute consolidative airspace disease, lung masses or significant pulmonary nodules. There is mild patchy subpleural reticulation and ground-glass attenuation in both lungs with associated mild traction bronchiectasis. There is a basilar gradient to these findings. No frank honeycombing. Mild patchy air trapping in both lungs on the expiration sequence. Interval stability of these  findings.  Upper abdomen: Cholecystectomy.  Musculoskeletal: No aggressive appearing focal osseous lesions. Marked thoracic spondylosis. Partially visualized surgical hardware from ACDF in the lower cervical spine.  IMPRESSION: 1. Continued stability of basilar predominant fibrotic interstitial lung disease without frank honeycombing. Findings are considered most compatible with an alternative diagnosis to UIP per ATS consensus criteria. 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 2. Mild patchy air trapping in both lungs, indicative of small airways disease. 3. Three-vessel coronary atherosclerosis.  Aortic Atherosclerosis (ICD10-I70.0).   Electronically Signed   By: Ilona Sorrel M.D.   On:  01/30/2018 08:07   IMPRESSION: Mild incomplete relaxation of the cricopharyngeus, but otherwise normal for age esophagram.   Electronically Signed   By: Genevie Ann M.D.   On: 02/10/2018 10:59  ROS - per HPI     has a past medical history of Arthritis, Bursitis, Diabetes mellitus without complication ([place]), Fibromyalgia, GERD (gastroesophageal reflux disease), Hyperlipemia, Hypertension, Sinusitis, Sleep apnea, Sleep apnea, and Wears glasses.   reports that she has never smoked. She has never used smokeless tobacco.  Past Surgical History:  Procedure Laterality Date  . ABDOMINAL HYSTERECTOMY  1999  . BREAST SURGERY  2002   rt br bx  . CERVICAL FUSION  2007  . CHOLECYSTECTOMY  2005   lap choli  . COLONOSCOPY    . FINGER GANGLION CYST EXCISION    . SHOULDER ARTHROSCOPY WITH OPEN ROTATOR CUFF REPAIR AND DISTAL CLAVICLE ACROMINECTOMY Right 09/09/2012   Procedure: Right Shoulder Arthroscopy with distal clavicle excision and mini-open rotator cuff repair.;  Surgeon: Alta Corning, MD;  Location: [facility];  Service: Orthopedics;  Laterality: Right;  . TONSILLECTOMY      Allergies  Allergen Reactions  . Latex Rash  . Celebrex  [Celecoxib] Swelling    hives  . Vioxx [Rofecoxib] Hives    Immunization History  Administered Date(s) Administered  . Influenza Split 02/02/2016  . Influenza, High Dose Seasonal PF 03/08/2015, 01/27/2017, 02/20/2017, 01/27/2018  . Pneumococcal Conjugate-13 06/01/2015  . Pneumococcal Polysaccharide-23 01/28/2012  . Pneumococcal-Unspecified 06/04/2013  . Tdap 04/29/2009    Family History  Problem Relation Age of Onset  . Emphysema Father   . Intracerebral hemorrhage Sister      Current Outpatient Medications:  .  ACCU-CHEK AVIVA PLUS test strip, Apply 1 strip topically daily. for testing, Disp: , Rfl: 1 .  ACCU-CHEK SOFTCLIX LANCETS lancets, TEST DAILY E11.9, Disp: , Rfl: 1 .  APPLE CIDER VINEGAR PO, Take by mouth daily. , Disp: , Rfl:  .  aspirin EC 81 MG tablet, Take 81 mg by mouth daily., Disp: , Rfl:  .  Calcium Carb-Cholecalciferol (CALCIUM+D3) 600-800 MG-UNIT TABS, Take 1 tablet by mouth daily., Disp: , Rfl:  .  cetirizine (ZYRTEC) 10 MG tablet, Take 10 mg by mouth daily., Disp: , Rfl: 0 .  co-enzyme Q-10 50 MG capsule, Take 100 mg by mouth daily., Disp: , Rfl:  .  [employer] NATURAL LUTEIN EYE HEALTH PO, Take 1 capsule by mouth daily. , Disp: , Rfl:  .  diclofenac sodium (VOLTAREN) 1 % GEL, Apply 1 application topically 2 (two) times daily., Disp: , Rfl:  .  fluticasone (FLONASE) 50 MCG/ACT nasal spray, Place 2 sprays into both nostrils daily. , Disp: , Rfl:  .  gabapentin (NEURONTIN) 100 MG capsule, Take 100 mg by mouth 2 (two) times daily., Disp: , Rfl:  .  gabapentin (NEURONTIN) 300 MG capsule, Take by mouth daily. , Disp: , Rfl:  .  Ginger, Zingiber officinalis, (GINGER ROOT) 550 MG CAPS, Take 1 capsule by mouth daily., Disp: , Rfl:  .  glipiZIDE (GLUCOTROL XL) 10 MG 24 hr tablet, TAKE 1 TABLET BY MOUTH AFTER LUNCH AND SUPPER, Disp: , Rfl:  .  levocarnitine (CARNITOR) 250 MG capsule, Take 500 mg by mouth daily., Disp: , Rfl:  .  losartan-hydrochlorothiazide (HYZAAR) 100-25 MG  tablet, TAKE (1) TABLET BY MOUTH ONCE DAILY., Disp: , Rfl:  .  metFORMIN (GLUCOPHAGE-XR) 500 MG 24 hr tablet, TAKE 2 TABLETS TWICE DAILY AS DIRECTED., Disp: , Rfl:  .  Misc Natural Products (TART CHERRY ADVANCED) CAPS, Take by mouth., Disp: , Rfl:  .  Misc. Devices MISC, Avivia Plus lancets and strips. Tests daily  DX E11.9, Disp: , Rfl:  .  naproxen sodium (ANAPROX) 550 MG tablet, , Disp: , Rfl:  .  Nutritional Supplements (EQ ESTROBLEND MENOPAUSE) TABS, Take 1 tablet by mouth daily., Disp: , Rfl:  .  omega-3 acid ethyl esters (LOVAZA) 1 g capsule, Take 1 g by mouth daily., Disp: , Rfl:  .  pregabalin (LYRICA) 50 MG capsule, Take 50 mg by mouth 2 (two) times daily. , Disp: , Rfl:  .  pyridostigmine (MESTINON) 60 MG tablet, Takes 1/2 tablet four times per day, Disp: 120 tablet, Rfl: 6 .  rosuvastatin (CRESTOR) 5 MG tablet, Take 5 mg by mouth daily at 6 PM., Disp: , Rfl:  .  sitaGLIPtin (JANUVIA) 100 MG tablet, TAKE (1) TABLET BY MOUTH ONCE DAILY., Disp: , Rfl:  .  Turmeric Curcumin 500 MG CAPS, Take 1 capsule by mouth daily., Disp: , Rfl:       Objective:   Vitals:   03/03/18 1130  BP: 122/66  Pulse: 68  SpO2: 98%  Weight: 205 lb (93 kg)  Height: _0  (1.626 m)    Estimated body mass index is 35.19 kg/m as calculated from the following:   Height as of this encounter: _1  (1.626 m).   Weight as of this encounter: 205 lb (93 kg).  _2 @  [employer]   03/03/18 1130  Weight: 205 lb (93 kg)     Physical Exam  General Appearance:    Alert, cooperative, no distress, appears stated age - yes , Deconditioned looking - yes , OBESE  - y3w, Sitting on Wheelchair -  no  Head:    Normocephalic, without obvious abnormality, atraumatic  Eyes:    PERRL, conjunctiva/corneas clear,  Ears:    Normal TM's and external ear canals, both ears  Nose:   Nares normal, septum midline, mucosa normal, no drainage    or sinus tenderness. OXYGEN ON  - no . Patient is @ ra   Throat:    Lips, mucosa, and tongue normal; teeth and gums normal. Cyanosis on lips - no  Neck:   Supple, symmetrical, trachea midline, no adenopathy;    thyroid:  no enlargement/tenderness/nodules; no carotid   bruit or JVD  Back:     Symmetric, no curvature, ROM normal, no CVA tenderness  Lungs:     Distress - no , Wheeze no, Barrell Chest - no, Purse lip breathing - no, Crackles - no   Chest Wall:    No tenderness or deformity.    Heart:    Regular rate and rhythm, S1 and S2 normal, no rub   or gallop, Murmur - no  Breast Exam:    NOT DONE  Abdomen:     Soft, non-tender, bowel sounds active all four quadrants,    no masses, no organomegaly. Visceral obesity - yes  Genitalia:   NOT DONE  Rectal:   NOT DONE  Extremities:   Extremities - normal, Has Cane - no, Clubbing - no, Edema - no  Pulses:   2+ and symmetric all extremities  Skin:   Stigmata of Connective Tissue Disease - STIGMATA of CONNECTIVE TISSUE DISEASE  - Distal digital fissuring (ie, "mechanic hands") - no - Distal digital tip ulceration -  no -Inflammatory arthritis or polyarticular morning joint stiffness ?60 minutes - no - Palmar telangiectasia - no - Raynaud  phenomenon - YES per Hx - MILD - Unexplained digital edema - no - Unexplained fixed rash on the digital extensor surfaces (Gottron's sign) - no ... - Deformities of RA - no - Scleroderma  - no - Malar Rash -  no   Lymph nodes:   Cervical, supraclavicular, and axillary nodes normal  Psychiatric:  Neurologic:   Pleasant - yes, Anxious - no, Flat affect - mild yes  CAm-ICU - neg, Alert and Oriented x 3 - yes, Moves all 4s - yes, Speech - normal, Cognition - intact           Assessment:       ICD-10-CM   1. ILD (interstitial lung disease) ([place]) J84.9   2. Positive anti-CCP test R76.8        Plan:     Patient Instructions     ICD-10-CM   1. ILD (interstitial lung disease) ([place]) J84.9   2. Positive anti-CCP test R76.8     Is possible you have a variety  called IPAF  - which means ILD from autoimmune antibodies but without the autoimmune disease elsewhere. Other possibilities are NSIP (females in your age group)  or a variety called HP or IPF (idiopathic)  Plan - check additionla blood work for ILD cause -  hypersennsitivity pneumonitis panel, myositis panel, lupus anticoagulant panel, ESR - if these are negative then can refer to Dr Roxan Hockey for surgical lung biopsy - keeping in mind your Myasthenia  Followup 3 months do spirometry and DLCOL 3 months in ILD clinic   > 50% of this > 25 min visit spent in face to face counseling or coordination of care - by this undersigned MD - Dr Brand Males. This includes one or more of the following documented above: discussion of test results, diagnostic or treatment recommendations, prognosis, risks and benefits of management options, instructions, education, compliance or risk-factor reduction     SIGNATURE    Dr. Brand Males, M.D., F.C.C.P,  Pulmonary and Critical Care Medicine Staff Physician, [place] Director - Interstitial Lung Disease  Program  Pulmonary [place] at [place], [place], [address]  Pager: [phone], If no answer or between  15:00h - 7:00h: call [phone] Telephone: [phone]  12:05 PM 03/03/2018

## 2018-03-05 DIAGNOSIS — E0510 Thyrotoxicosis with toxic single thyroid nodule without thyrotoxic crisis or storm: Secondary | ICD-10-CM | POA: Diagnosis not present

## 2018-03-05 LAB — SPECIMEN STATUS REPORT

## 2018-03-09 LAB — HYPERSENSITIVITY PNUEMONITIS PROFILE
ASPERGILLUS FUMIGATUS: NEGATIVE
Faenia retivirgula: NEGATIVE
Pigeon Serum: NEGATIVE
S. VIRIDIS: NEGATIVE
T. CANDIDUS: NEGATIVE
T. VULGARIS: NEGATIVE

## 2018-03-13 LAB — MYOSITIS PANEL III: RNP: 5.4 EU/ml

## 2018-03-13 LAB — LUPUS ANTICOAGULANT PANEL

## 2018-05-28 DIAGNOSIS — Z9989 Dependence on other enabling machines and devices: Secondary | ICD-10-CM | POA: Diagnosis not present

## 2018-05-28 DIAGNOSIS — J310 Chronic rhinitis: Secondary | ICD-10-CM | POA: Diagnosis not present

## 2018-05-28 DIAGNOSIS — G4733 Obstructive sleep apnea (adult) (pediatric): Secondary | ICD-10-CM | POA: Diagnosis not present

## 2018-05-28 DIAGNOSIS — I10 Essential (primary) hypertension: Secondary | ICD-10-CM | POA: Diagnosis not present

## 2018-06-02 ENCOUNTER — Ambulatory Visit (INDEPENDENT_AMBULATORY_CARE_PROVIDER_SITE_OTHER): Payer: PPO | Admitting: Internal Medicine

## 2018-06-02 ENCOUNTER — Encounter: Payer: Self-pay | Admitting: Internal Medicine

## 2018-06-02 ENCOUNTER — Ambulatory Visit: Payer: PPO | Admitting: Internal Medicine

## 2018-06-02 VITALS — BP 120/70 | HR 72 | Ht 63.0 in | Wt 203.0 lb

## 2018-06-02 DIAGNOSIS — J849 Interstitial pulmonary disease, unspecified: Secondary | ICD-10-CM

## 2018-06-02 DIAGNOSIS — R768 Other specified abnormal immunological findings in serum: Secondary | ICD-10-CM | POA: Diagnosis not present

## 2018-06-02 LAB — PULMONARY FUNCTION TEST
DL/VA % PRED: 90 %
DL/VA: 3.76 ml/min/mmHg/L
DLCO UNC: 15.58 ml/min/mmHg
DLCO unc % pred: 83 %
FEF 25-75 Pre: 3.9 L/sec
FEF2575-%PRED-PRE: 217 %
FEV1-%PRED-PRE: 110 %
FEV1-Pre: 2.38 L
FEV1FVC-%PRED-PRE: 121 %
FEV6-%PRED-PRE: 95 %
FEV6-PRE: 2.59 L
FEV6FVC-%PRED-PRE: 104 %
FVC-%PRED-PRE: 91 %
FVC-Pre: 2.59 L
Pre FEV1/FVC ratio: 92 %
Pre FEV6/FVC Ratio: 100 %

## 2018-06-02 NOTE — Progress Notes (Signed)
Spirometry and Dlco done today. 

## 2018-06-02 NOTE — Patient Instructions (Addendum)
ICD-10-CM   1. ILD (interstitial lung disease) ([place]) J84.9   2. Positive anti-CCP test R76.8     ILD is stable  Basis for ILD is not known -could be IPF, NSIP or rarely chronic hypersensitivity pneumonitis  Plan Refer Dr Roxan Hockey for evaluation for surgical lung biopsy   - if he feels risk is too high on basis of sleep apne and myasthenia let me know  Followup 3 months spirometry and dlco Return to ILD clinic in 3 months but if he does biopsy will see you sooner

## 2018-06-02 NOTE — Progress Notes (Signed)
IOV 06/04/2016  Chief Complaint  Patient presents with  . Advice Only    referred by Dr. Estanislado Pandy for ILD, abn ct.     73 year old female referred by Dr. Keturah Barre in the rheumatology clinic for evaluation of dyspnea in the setting of positive CCP and CT chest suggestive of ILD  History is taken from her and review of the outside chart of rheumatology and neurology January 2018  She is significantly obese although this is been stable for the last several years. Several years ago she developed knee issues and is wearing a brace/shoulder and her right lower extremity. Since then she's been less mobile and less active. Corresponding with this she's had insidious onset of dyspnea on exertion for climbing stairs and other activities that are class II-III nature. This dyspnea has been stable all along without any associated weight change. It is present with exertion relieved by rest. Is known orthopnea paroxysmal nocturnal dyspnea. She's had occasional associated dry cough but this is not consistent in the same with wheezing which is not consistent. There is no associated chest pain.  According to her history she's been seeing Dr. 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 According to her and review of the chart this is localized only to the ocular muscles she is on pyridostigmine. At this time a high-resolution CT chest was done 04/27/2016 that I personally visualized that shows coronary artery calcification which is unaware of and also mild subtle ILD changes. She subsequently saw Dr. Blake Divine January 2018 and autoimmune profile showed elevated CCP that was isolated but no clinical or systemic evidence of rheumatoid arthritis. The diagnoses on the joints is still osteoarthritis. Therefore she's been referred here. There is no associated chest pain     review of the chart shows she's not had an echocardiogram a  cardiac stress test btu she tells me she has one at [facility] with her cardioloigist 2 years ago was normal  Walking desaturation test 185 feet 3 laps on room air: Did not desaturate.  Results for [name], [name] (MRN [id number]) as of 06/04/2016 15:47  Ref. Range 01/15/2016 13:00  Acetylchol Block Ab Latest Ref Range: 0 - 25 % 31 (H)  Acety choline binding ab Latest Ref Range: 0.00 - 0.24 nmol/L 6.16 (H)  Acetylcholine Modulat Ab Latest Ref Range: 0 - 20 % 48 (H)   Results for [name], [name] (MRN [id number]) as of 06/04/2016 15:47  Ref. Range 05/02/2016 10:00  CK Total Latest Ref Range: 7 - 177 U/L 99  Sed Rate Latest Ref Range: 0 - 30 mm/hr 4  Anit Nuclear Antibody(ANA) Latest Ref Range: NEGATIVE  NEG  Angiotensin-Converting Enzyme Latest Ref Range: 9 - 67 U/L 34  Cyclic Citrullin Peptide Ab Latest Units: Units 109 (H)  ds DNA Ab Latest Units: IU/mL <1  Myeloperoxidase Abs Latest Ref Range: <1.0 AI  <1.0  Serine Protease 3 Latest Ref Range: <1.0 AI  <1.0  RA Latex Turbid. Latest Ref Range: <14 IU/mL <14  ENA SM Ab Ser-aCnc Latest Ref Range: <1.0 NEG AI  <1.0 NEG  Ribonucleic Protein(ENA) Antibody, IgG Latest Ref Range: <1.0 NEG AI  <1.0 NEG  SSA (Ro) (ENA) Antibody, IgG Latest Ref Range: <1.0 NEG AI  <1.0 NEG  SSB (La) (ENA) Antibody, IgG Latest Ref Range: <1.0 NEG AI  <1.0 NEG  Scleroderma (Scl-70) (ENA) Antibody, IgG Latest Ref Range: <1.0  NEG AI  <1.0 NEG      OV 12/03/2016  Chief Complaint  Patient presents with  . Follow-up    Pt states her breathing is unchanged since last OV. Pt denies significant cough, CP/tightness, f/c/s.    Follow-up shortness of breath that is multifactorial but associated with interstitial lung disease on CT scan of the chest December 2017 associated with positive CCP antibody and a diagnosis of osteoarthritis but not rheumatoid arthritis in the joints  She continues to be stable. There are no interim issues no chest pain. She only has mild shortness of  breath with exertion. Her ocular myasthenia is improved. Pulmonary function test shows mild reduction in isolated diffusion capacity 74%. Forced vital capacity and lung volumes are normal. High-resolution CT chest shows subtle ILD in the bases non-UIP pattern.  CT chest high resolution IMPRESSION: 1. 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 2. Aortic atherosclerosis, in addition to left main and 3 vessel coronary artery disease. Please note that although the presence of coronary artery calcium documents the presence of coronary artery disease, the severity of this disease and any potential stenosis cannot be assessed on this non-gated CT examination. Assessment for potential risk factor modification, dietary therapy or pharmacologic therapy may be warranted, if clinically indicated. 3. Mild hepatic steatosis. 4. Additional incidental findings, as above.  Aortic Atherosclerosis (ICD10-I70.0).   Electronically Signed   By: Vinnie Langton M.D.   On: 11/29/2016 10:15    OV 06/03/2017   Chief Complaint  Patient presents with  . Follow-up    PFT done today.  Pt states her breathing is about the same as last visit.  Pt recently had a sinus infection but is not currently on any meds. Has complaints of a cough with green mucus. Denies any CP.    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  In the setting of myasthenia gravis  Follow-up coronary artery calcification  Last visit August 2018.  Overall stable since then.  She reports significant dyspnea when climbing stairs but otherwise doing well.  Overall stable.  And walking desaturation test she got very tachycardic even though she did not desaturate.  She is known to have  coronary artery calcification and I referred her to cardiology but today she tells me that she follows for this at [facility] with Dr. Claudie Leach and her stress test is upcoming.  She has never been to pulmonary rehabilitation.  There is no associated chest pain or cough.  Follow pulmonary function test shows continued stability but perhaps reduction in DLCO.  On exam she has no crackles.     Walking desaturation test on 06/03/2017 185 feet x 3 laps on ROOM AIR:  did NOT desaturate. Rest pulse ox was 100%, final pulse ox was 99%. HR response 75/min at rest to 112/min at peak exertion. Patient [name]  Did ot Desaturate < 88% . [name] did not  Desaturated </= 3% points. [name] yes did get tachyardic   OV 03/03/2018  Subjective:  Patient ID: [name], female , DOB: [birth year] , age 70 y.o. , MRN: [id number] , ADDRESS: [place] [address]   03/03/2018 -   Chief Complaint  Patient presents with  . Follow-up    pt c/o sob  with exertion.  denies cough, chest congestion.     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  In the setting of myasthenia gravis  Also coronary arter calcification  -     HPI [name] 73 y.o. -returns for follow-up of ILD not otherwise specified.  After last visit she did see her cardiologist.  According to her there is no discussion about the coronary artery calcification but she recollects a normal stress test with the last few to several years.  She started attending pulmonary rehabilitation for the last few to several months.  She is been doing well with this and improved dyspnea.  Overall she is stable.  She had high-resolution CT chest in October 2019 that is unchanged from February 2018.  The pattern is described as either indeterminate or alternative diagnosis.   This seems to be some craniocaudal gradient with reticulations and traction bronchiectasis.  However there is some air trapping as well.  She is attending pulmonary rehabilitation and this is improved her dyspnea.  However the etiology of her ILD still unknown.  She did see Dr. D rheumatologist January 30, 2018 and has been advised that she has osteoarthritis based on my chart review.  She is on as needed follow-up.  Patient is wondering about the etiology of her ILD and is wondering about the safety of surgical lung biopsy.  Did a history retake and she did admit to having RAYNAUD phenomenon mild in the last few winters of her fingers.  T    Results for [name], [name] (MRN [id number]) as of 06/03/2017 11:52  Ref. Range 12/03/2016 09:51 06/03/2017 10:48  FVC-Pre Latest Units: L 2.66 2.66  FVC-%Pred-Pre Latest Units: % 92 93   Results for [name], [name] (MRN [id number]) as of 06/03/2017 11:52  Ref. Range 12/03/2016 09:51 06/03/2017 10:48  DLCO unc Latest Units: ml/min/mmHg 17.17 16.35  DLCO unc % pred Latest Units: % 74 71     HRCT 01/30/18 - [place]MPARISON:  11/29/2016 high-resolution chest CT.  FINDINGS: Lungs/Pleura: No pneumothorax. No pleural effusion. No acute consolidative airspace disease, lung masses or significant pulmonary nodules. There is mild patchy subpleural reticulation and ground-glass attenuation in both lungs with associated mild traction bronchiectasis. There is a basilar gradient to these findings. No frank honeycombing. Mild patchy air trapping in both lungs on the expiration sequence. Interval stability of these findings.  Upper abdomen: Cholecystectomy.  Musculoskeletal: No aggressive appearing focal osseous lesions. Marked thoracic spondylosis. Partially visualized surgical hardware from ACDF in the lower cervical spine.  IMPRESSION: 1. Continued stability of basilar predominant fibrotic interstitial lung disease without frank honeycombing. Findings are  considered most compatible with an alternative diagnosis to UIP per ATS consensus criteria. 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 2. Mild patchy air trapping in both lungs, indicative of small airways disease. 3. Three-vessel coronary atherosclerosis.  Aortic Atherosclerosis (ICD10-I70.0).   Electronically Signed   By: Ilona Sorrel M.D.   On: 01/30/2018 08:07   IMPRESSION: Mild incomplete relaxation of the cricopharyngeus, but otherwise normal for age esophagram.   Electronically Signed   By: Genevie Ann M.D.   On: 02/10/2018 10:  OV 06/02/2018  Subjective:  Patient ID: [name], female , DOB: [birth year] , age 73 y.o. , MRN: [id number] , ADDRESS: [place] [address]   06/02/2018 -   Chief Complaint  Patient presents with  .  Follow-up    PFT performed today.  Pt states she has been doing good since last visit and denies any complaints.    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  In the setting of myasthenia gravis    HPI [name] 73 y.o. -reports for follow-up.  In the interim she has had no new problems.  She had pulmonary function test.  The FVC is stable/slight decline.  Similarly DLCO even though the percentage is higher the wrong number shows possible decline.  This is because of the new equation called GLI.e. question that we are using.  Because of various possibilities of ILD #29 visit we did extensive autoimmune panel and this was all negative.  Currently the only positive result is the CCP antibody which was 109 in January 2018.  I have not over connective tissue disease history again but she denies.  In fact this time I asked her about RAYNAUD again and at this time she denies.  There are no new issues.  We discussed about the need to do a surgical  lung biopsy.  She is somewhat nervous because of obesity, sleep apnea and myasthenia gravis.  However, she is open to talking to the surgeon about it.  She is attending pulmonary rehabilitation and this helps her.    Results for [name], [name] (MRN [id number]) as of 06/02/2018 13:42  Ref. Range 12/03/2016 09:51 06/03/2017 11:05 06/02/2018 12:49  DLCO unc Latest Units: ml/min/mmHg 17.17 16.35 15.58  DLCO unc % pred Latest Units: % 74 71 83 - new GLI equation   Results for [name], [name] (MRN [id number]) as of 06/02/2018 13:42  Ref. Range 12/03/2016 09:51 06/03/2017 11:05 06/02/2018 12:49  FVC-Pre Latest Units: L 2.66 2.66 2.59  FVC-%Pred-Pre Latest Units: % 92 93 91     Simple office walk 185 feet x  3 laps goal with forehead probe 03/03/2018  06/02/2018   O2 used Room air Room air  Number laps completed 3 3  Comments about pace normal normal  Resting Pulse Ox/HR 100% and 66/min 98% and 72/min  Final Pulse Ox/HR 99% and 108/min 96% and 57/mn  Desaturated </= 88% no no  Desaturated <= 3% points no no  Got Tachycardic >/= 90/min yes no  Symptoms at end of test x Mild dyspnea  Miscellaneous comments x     ROS - per HPI     has a past medical history of Arthritis, Bursitis, Diabetes mellitus without complication ([place]), Fibromyalgia, GERD (gastroesophageal reflux disease), Hyperlipemia, Hypertension, Sinusitis, Sleep apnea, Sleep apnea, and Wears glasses.   reports that she has never smoked. She has never used smokeless tobacco.  Past Surgical History:  Procedure Laterality Date  . ABDOMINAL HYSTERECTOMY  1999  . BREAST SURGERY  2002   rt br bx  . CERVICAL FUSION  2007  . CHOLECYSTECTOMY  2005   lap choli  . COLONOSCOPY    . FINGER GANGLION CYST EXCISION    . SHOULDER ARTHROSCOPY WITH OPEN ROTATOR CUFF REPAIR AND DISTAL CLAVICLE ACROMINECTOMY Right 09/09/2012   Procedure: Right Shoulder Arthroscopy with distal clavicle excision and mini-open rotator cuff repair.;  Surgeon: Alta Corning, MD;  Location: [facility];  Service: Orthopedics;  Laterality: Right;  . TONSILLECTOMY      Allergies  Allergen Reactions  . Latex Rash  . Celebrex [Celecoxib] Swelling  hives  . Vioxx [Rofecoxib] Hives    Immunization History  Administered Date(s) Administered  . Influenza Split 02/02/2016  . Influenza, High Dose Seasonal PF 03/08/2015, 01/27/2017, 02/20/2017, 01/27/2018  . Pneumococcal Conjugate-13 06/01/2015  . Pneumococcal Polysaccharide-23 01/28/2012  . Pneumococcal-Unspecified 06/04/2013  . Tdap 04/29/2009    Family History  Problem Relation Age of Onset  . Emphysema Father   . Intracerebral hemorrhage Sister      Current Outpatient Medications:  .  ACCU-CHEK AVIVA PLUS test strip, Apply 1 strip topically daily. for testing, Disp: , Rfl: 1 .  ACCU-CHEK SOFTCLIX LANCETS lancets, TEST DAILY E11.9, Disp: , Rfl: 1 .  APPLE CIDER VINEGAR PO, Take by mouth daily. , Disp: , Rfl:  .  aspirin EC 81 MG tablet, Take 81 mg by mouth daily., Disp: , Rfl:  .  Calcium Carb-Cholecalciferol (CALCIUM+D3) 600-800 MG-UNIT TABS, Take 1 tablet by mouth daily., Disp: , Rfl:  .  cetirizine (ZYRTEC) 10 MG tablet, Take 10 mg by mouth daily., Disp: , Rfl: 0 .  co-enzyme Q-10 50 MG capsule, Take 100 mg by mouth daily., Disp: , Rfl:  .  [employer] NATURAL LUTEIN EYE HEALTH PO, Take 1 capsule by mouth daily. , Disp: , Rfl:  .  diclofenac sodium (VOLTAREN) 1 % GEL, Apply 1 application topically 2 (two) times daily., Disp: , Rfl:  .  fluticasone (FLONASE) 50 MCG/ACT nasal spray, Place 2 sprays into both nostrils daily. , Disp: , Rfl:  .  gabapentin (NEURONTIN) 100 MG capsule, Take 100 mg by mouth 2 (two) times daily., Disp: , Rfl:  .  gabapentin (NEURONTIN) 300 MG capsule, Take by mouth daily. , Disp: , Rfl:  .  Ginger, Zingiber officinalis, (GINGER ROOT) 550 MG CAPS, Take 1 capsule by mouth daily., Disp: , Rfl:  .  glipiZIDE (GLUCOTROL XL) 10 MG 24 hr tablet, TAKE 1 TABLET BY MOUTH  AFTER LUNCH AND SUPPER, Disp: , Rfl:  .  levocarnitine (CARNITOR) 250 MG capsule, Take 500 mg by mouth daily., Disp: , Rfl:  .  losartan-hydrochlorothiazide (HYZAAR) 100-25 MG tablet, TAKE (1) TABLET BY MOUTH ONCE DAILY., Disp: , Rfl:  .  metFORMIN (GLUCOPHAGE-XR) 500 MG 24 hr tablet, TAKE 2 TABLETS TWICE DAILY AS DIRECTED., Disp: , Rfl:  .  Misc Natural Products (TART CHERRY ADVANCED) CAPS, Take by mouth., Disp: , Rfl:  .  Misc. Devices MISC, Avivia Plus lancets and strips. Tests daily  DX E11.9, Disp: , Rfl:  .  Nutritional Supplements (EQ ESTROBLEND MENOPAUSE) TABS, Take 1 tablet by mouth daily., Disp: , Rfl:  .  omega-3 acid ethyl esters (LOVAZA) 1 g capsule, Take 1 g by mouth daily., Disp: , Rfl:  .  pregabalin (LYRICA) 50 MG capsule, Take 50 mg by mouth 2 (two) times daily. , Disp: , Rfl:  .  pyridostigmine (MESTINON) 60 MG tablet, Takes 1/2 tablet four times per day, Disp: 120 tablet, Rfl: 6 .  rosuvastatin (CRESTOR) 5 MG tablet, Take 5 mg by mouth daily at 6 PM., Disp: , Rfl:  .  sitaGLIPtin (JANUVIA) 100 MG tablet, TAKE (1) TABLET BY MOUTH ONCE DAILY., Disp: , Rfl:  .  Turmeric Curcumin 500 MG CAPS, Take 1 capsule by mouth daily., Disp: , Rfl:       Objective:   Vitals:   06/02/18 1332  BP: 120/70  Pulse: 72  SpO2: 98%  Weight: 203 lb (92.1 kg)  Height: 5\' 3"  (1.6 m)    Estimated body mass index is 35.96 kg/m  as calculated from the following:   Height as of this encounter: 5\' 3"  (1.6 m).   Weight as of this encounter: 203 lb (92.1 kg).  @WEIGHTCHANGE @  [employer]   06/02/18 1332  Weight: 203 lb (92.1 kg)     Physical Exam  General Appearance:    Alert, cooperative, no distress, appears stated age - yes , Deconditioned looking - yes , OBESE  - yes, Sitting on Wheelchair -  no  Head:    Normocephalic, without obvious abnormality, atraumatic  Eyes:    PERRL, conjunctiva/corneas clear,  Ears:    Normal TM's and external ear canals, both ears  Nose:   Nares normal,  septum midline, mucosa normal, no drainage    or sinus tenderness. OXYGEN ON  - no . Patient is @ ra   Throat:   Lips, mucosa, and tongue normal; teeth and gums normal. Cyanosis on lips - no  Neck:   Supple, symmetrical, trachea midline, no adenopathy;    thyroid:  no enlargement/tenderness/nodules; no carotid   bruit or JVD  Back:     Symmetric, no curvature, ROM normal, no CVA tenderness  Lungs:     Distress - no , Wheeze no, Barrell Chest - no, Purse lip breathing - no, Crackles - yes at base   Chest Wall:    No tenderness or deformity.    Heart:    Regular rate and rhythm, S1 and S2 normal, no rub   or gallop, Murmur - no  Breast Exam:    NOT DONE  Abdomen:     Soft, non-tender, bowel sounds active all four quadrants,    no masses, no organomegaly. Visceral obesity - yes  Genitalia:   NOT DONE  Rectal:   NOT DONE  Extremities:   Extremities - normal, Has Cane - no, Clubbing - no, Edema - no  Pulses:   2+ and symmetric all extremities  Skin:   Stigmata of Connective Tissue Disease - no  Lymph nodes:   Cervical, supraclavicular, and axillary nodes normal  Psychiatric:  Neurologic:   Pleasant - yes, Anxious - no, Flat affect - no  CAm-ICU - neg, Alert and Oriented x 3 - yes, Moves all 4s - yes, Speech - normal, Cognition - intact           Assessment:       ICD-10-CM   1. ILD (interstitial lung disease) ([place]) J84.9 Ambulatory referral to Cardiothoracic Surgery    Pulmonary Function Test  2. Positive anti-CCP test R76.8 Ambulatory referral to Cardiothoracic Surgery   Suspect overall she is stable but changes in FVC/DLCO need to be monitored closely.    Plan:     Patient Instructions     ICD-10-CM   1. ILD (interstitial lung disease) ([place]) J84.9   2. Positive anti-CCP test R76.8     ILD is stable Basis for ILD is not known  Plan Refer Dr Roxan Hockey for evaluation for surgical lung biopsy   - if he feels risk is too high on basis of sleep apne and myasthenia let me  know  Followup 3 months spirometry and dlco Return to ILD clinic in 3 months but if he does biopsy will see you sooner   > 50% of this > 25 min visit spent in face to face counseling or coordination of care - by this undersigned MD - Dr Brand Males. This includes one or more of the following documented above: discussion of test results, diagnostic or treatment recommendations, prognosis,  risks and benefits of management options, instructions, education, compliance or risk-factor reduction   SIGNATURE    Dr. Brand Males, M.D., F.C.C.P,  Pulmonary and Critical Care Medicine Staff Physician, [place] Director - Interstitial Lung Disease  Program  Pulmonary [place] [facility] [place], [place], [address]  Pager: [phone], If no answer or between  15:00h - 7:00h: call [phone] Telephone: [phone]  2:04 PM 06/02/2018

## 2018-06-17 ENCOUNTER — Encounter: Payer: PPO | Admitting: Thoracic Surgery (Cardiothoracic Vascular Surgery)

## 2018-06-24 ENCOUNTER — Institutional Professional Consult (permissible substitution): Payer: PPO | Admitting: Thoracic Surgery (Cardiothoracic Vascular Surgery)

## 2018-06-24 ENCOUNTER — Other Ambulatory Visit: Payer: Self-pay

## 2018-06-24 ENCOUNTER — Other Ambulatory Visit: Payer: Self-pay | Admitting: *Deleted

## 2018-06-24 ENCOUNTER — Encounter: Payer: Self-pay | Admitting: Thoracic Surgery (Cardiothoracic Vascular Surgery)

## 2018-06-24 VITALS — BP 128/69 | HR 70 | Resp 16 | Ht 64.0 in | Wt 205.0 lb

## 2018-06-24 DIAGNOSIS — J849 Interstitial pulmonary disease, unspecified: Secondary | ICD-10-CM

## 2018-06-24 NOTE — Progress Notes (Signed)
PCP is Dione Housekeeper, MD Referring Provider is Brand Males, MD  Chief Complaint  Patient presents with  . Interstitial Lung Disease    Surgical eval, PFT's 06/02/18, Chest CT 01/29/18    HPI: [name] is sent for a surgical consultation for lung biopsy for interstitial lung disease.  [name] is a 73 year old woman with a history of ocular myasthenia, obstructive sleep apnea, obesity, hypertension, hyperlipidemia, coronary artery disease on CT, type 2 diabetes without complication, fibromyalgia, arthritis, and bursitis.  She says she is been short of breath for many years.  It has been about 2 years ago that she was diagnosed with interstitial lung disease.   Her symptoms have been relatively stable.  She does get short of breath with exertion.  She has been participating in pulmonary rehab. She is followed by Dr. Chase Caller.  She recently saw him and he recommended a surgical lung biopsy for diagnostic purposes.  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  She was having some dysphasia that has resolved now.  She was not having any aspiration.  In addition to her respiratory status her activity is limited by arthritis.  She is not currently on steroids.  Zubrod Score: At the time of surgery this patient's most appropriate activity status/level should be described as: []     0    Normal activity, no symptoms []     1    Restricted in physical strenuous activity but ambulatory, able to do out light work [x]     2    Ambulatory and capable of self care, unable to do work activities, up and about >50 % of waking hours                              []     3    Only limited self care, in bed greater than 50% of waking hours []     4    Completely disabled, no self care, confined to bed or chair []     5    Moribund  Past Medical History:  Diagnosis Date  . Arthritis   . Bursitis   . Diabetes mellitus without complication ([place])   . Fibromyalgia   . GERD (gastroesophageal reflux  disease)   . Hyperlipemia   . Hypertension   . Sinusitis   . Sleep apnea    uses a cpap  . Sleep apnea   . Wears glasses     Past Surgical History:  Procedure Laterality Date  . ABDOMINAL HYSTERECTOMY  1999  . BREAST SURGERY  2002   rt br bx  . CERVICAL FUSION  2007  . CHOLECYSTECTOMY  2005   lap choli  . COLONOSCOPY    . FINGER GANGLION CYST EXCISION    . SHOULDER ARTHROSCOPY WITH OPEN ROTATOR CUFF REPAIR AND DISTAL CLAVICLE ACROMINECTOMY Right 09/09/2012   Procedure: Right Shoulder Arthroscopy with distal clavicle excision and mini-open rotator cuff repair.;  Surgeon: Alta Corning, MD;  Location: [facility];  Service: Orthopedics;  Laterality: Right;  . TONSILLECTOMY      Family History  Problem Relation Age of Onset  . Emphysema Father   . Intracerebral hemorrhage Sister     Social History Social History   Tobacco Use  . Smoking status: Never Smoker  . Smokeless tobacco: Never Used  Substance Use Topics  . Alcohol use: No  . Drug use: No    Current Outpatient Medications  Medication  Sig Dispense Refill  . ACCU-CHEK AVIVA PLUS test strip Apply 1 strip topically daily. for testing  1  . ACCU-CHEK SOFTCLIX LANCETS lancets TEST DAILY E11.9  1  . APPLE CIDER VINEGAR PO Take by mouth daily.     Marland Kitchen aspirin EC 81 MG tablet Take 81 mg by mouth daily.    . Calcium Carb-Cholecalciferol (CALCIUM+D3) 600-800 MG-UNIT TABS Take 1 tablet by mouth daily.    . cetirizine (ZYRTEC) 10 MG tablet Take 10 mg by mouth daily.  0  . co-enzyme Q-10 50 MG capsule Take 100 mg by mouth daily.    . [employer] NATURAL LUTEIN EYE HEALTH PO Take 1 capsule by mouth daily.     . diclofenac sodium (VOLTAREN) 1 % GEL Apply 1 application topically 2 (two) times daily.    . fluticasone (FLONASE) 50 MCG/ACT nasal spray Place 2 sprays into both nostrils daily.     Marland Kitchen gabapentin (NEURONTIN) 100 MG capsule Take 100 mg by mouth every morning.     . gabapentin (NEURONTIN) 300 MG capsule Take 300 mg  by mouth at bedtime.     . Ginger, Zingiber officinalis, (GINGER ROOT) 550 MG CAPS Take 1 capsule by mouth daily.    Marland Kitchen glipiZIDE (GLUCOTROL XL) 10 MG 24 hr tablet TAKE 1 TABLET BY MOUTH AFTER LUNCH AND SUPPER    . levocarnitine (CARNITOR) 250 MG capsule Take 500 mg by mouth daily.    Marland Kitchen losartan-hydrochlorothiazide (HYZAAR) 100-25 MG tablet TAKE (1) TABLET BY MOUTH ONCE DAILY.    . metFORMIN (GLUCOPHAGE-XR) 500 MG 24 hr tablet TAKE 2 TABLETS TWICE DAILY AS DIRECTED.    . Misc Natural Products (TART CHERRY ADVANCED) CAPS Take by mouth.    . Misc. Devices MISC Avivia Plus lancets and strips. Tests daily  DX E11.9    . Nutritional Supplements (EQ ESTROBLEND MENOPAUSE) TABS Take 1 tablet by mouth daily.    Marland Kitchen omega-3 acid ethyl esters (LOVAZA) 1 g capsule Take 1 g by mouth daily.    . pregabalin (LYRICA) 50 MG capsule Take 50 mg by mouth 2 (two) times daily.     Marland Kitchen pyridostigmine (MESTINON) 60 MG tablet Takes 1/2 tablet four times per day 120 tablet 6  . rosuvastatin (CRESTOR) 5 MG tablet Take 5 mg by mouth daily at 6 PM.    . sitaGLIPtin (JANUVIA) 100 MG tablet TAKE (1) TABLET BY MOUTH ONCE DAILY.    . Turmeric Curcumin 500 MG CAPS Take 1 capsule by mouth daily.     No current facility-administered medications for this visit.     Allergies  Allergen Reactions  . Latex Rash  . Celebrex [Celecoxib] Swelling    hives  . Vioxx [Rofecoxib] Hives    Review of Systems  Constitutional: Positive for activity change, fatigue and unexpected weight change (Lost 10 pounds over 3 months).  HENT: Positive for trouble swallowing and voice change.   Eyes: Positive for visual disturbance (Blurry vision).  Respiratory: Positive for apnea (cPAP at night) and shortness of breath.   Genitourinary: Negative for difficulty urinating and dysuria.  Musculoskeletal: Positive for arthralgias, joint swelling and myalgias (Leg cramps).  Neurological: Positive for dizziness. Negative for seizures and syncope.   Hematological: Negative for adenopathy. Bruises/bleeds easily.    BP 128/69 (BP Location: Right Arm, Patient Position: Sitting, Cuff Size: Large)   Pulse 70   Resp 16   Ht 5\' 4"  (1.626 m)   Wt 205 lb (93 kg)   SpO2 95% Comment: RA  BMI 35.19 kg/m  Physical Exam Vitals signs reviewed.  Constitutional:      General: She is not in acute distress.    Appearance: She is obese.  HENT:     Head: Normocephalic and atraumatic.     Mouth/Throat:     Pharynx: Oropharynx is clear.  Eyes:     General: No scleral icterus.    Extraocular Movements: Extraocular movements intact.  Neck:     Musculoskeletal: Neck supple.  Cardiovascular:     Rate and Rhythm: Normal rate and regular rhythm.     Heart sounds: Murmur (2/6 systolic) present.  Pulmonary:     Effort: Pulmonary effort is normal. No respiratory distress.     Breath sounds: Normal breath sounds. No wheezing or rales.  Abdominal:     General: There is no distension.     Palpations: Abdomen is soft.     Tenderness: There is no abdominal tenderness.  Musculoskeletal:        General: Deformity (Right sternoclavicular joint) present.  Lymphadenopathy:     Cervical: No cervical adenopathy.  Skin:    General: Skin is warm and dry.  Neurological:     Mental Status: She is alert.     Gait: Gait abnormal.    Diagnostic Tests: CT CHEST WITHOUT CONTRAST  TECHNIQUE: Multidetector CT imaging of the chest was performed following the standard protocol without intravenous contrast. High resolution imaging of the lungs, as well as inspiratory and expiratory imaging, was performed.  COMPARISON:  11/29/2016 high-resolution chest CT.  FINDINGS: Cardiovascular: Normal heart size. No significant pericardial effusion/thickening. Atherosclerotic nonaneurysmal thoracic aorta. Normal caliber pulmonary arteries. Three-vessel coronary atherosclerosis.  Mediastinum/Nodes: No discrete thyroid nodules. Unremarkable esophagus. No  pathologically enlarged axillary, mediastinal or hilar lymph nodes, noting limited sensitivity for the detection of hilar adenopathy on this noncontrast study.  Lungs/Pleura: No pneumothorax. No pleural effusion. No acute consolidative airspace disease, lung masses or significant pulmonary nodules. There is mild patchy subpleural reticulation and ground-glass attenuation in both lungs with associated mild traction bronchiectasis. There is a basilar gradient to these findings. No frank honeycombing. Mild patchy air trapping in both lungs on the expiration sequence. Interval stability of these findings.  Upper abdomen: Cholecystectomy.  Musculoskeletal: No aggressive appearing focal osseous lesions. Marked thoracic spondylosis. Partially visualized surgical hardware from ACDF in the lower cervical spine.  IMPRESSION: 1. Continued stability of basilar predominant fibrotic interstitial lung disease without frank honeycombing. Findings are considered most compatible with an alternative diagnosis to UIP per ATS consensus criteria. 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 2. Mild patchy air trapping in both lungs, indicative of small airways disease. 3. Three-vessel coronary atherosclerosis.  Aortic Atherosclerosis (ICD10-I70.0).   Electronically Signed   By: Ilona Sorrel M.D.   On: 01/30/2018 08:07 I personally reviewed the CT images concur with the findings noted above  Impression: [name] is a 73 year old woman with morbid obesity, obstructive sleep apnea, ocular myasthenia, hypertension, hyperlipidemia, type 2 diabetes, coronary artery disease on CT scan, rheumatoid arthritis by labs, and osteoarthritis.  She was found to have interstitial lung disease about 2 years ago.  This is not felt to be UIP.  She and her pulmonologist Dr. Chase Caller would like a lung biopsy for diagnostic purposes.  I described the general nature of the procedure to  Mrs. Gloor and her husband.  They understand the need for general anesthesia, the incisions to be used, the use of a drainage tube postoperatively, the expected hospital stay, and the  overall recovery.  They understand there is no guarantee of a definitive diagnosis.  I informed him of the indications, risks, benefits, and alternatives.  She is high risk due to her obesity, obstructive sleep apnea, and myasthenia.  They understand the risks include, but are not limited to death, MI, DVT, PE, bleeding, possible need for transfusion, infection, air leak, cardiac arrhythmias, as well as the possibility of other unforeseeable complications.  She is willing to accept those risks and would like to proceed.  Given her myasthenia and obstructive sleep apnea combination I do think we should have an anesthesiology consult prior to surgery.  Plan: Preoperative anesthesiology consult due to myasthenia and sleep apnea Right VATS for lung biopsy on Wednesday, 07/08/2018 Melrose Nakayama, MD [facility] [phone]

## 2018-06-25 ENCOUNTER — Encounter: Payer: Self-pay | Admitting: *Deleted

## 2018-07-02 DIAGNOSIS — L03211 Cellulitis of face: Secondary | ICD-10-CM | POA: Diagnosis not present

## 2018-07-03 NOTE — Pre-Procedure Instructions (Signed)
[name]  07/03/2018     [name] - [address], [place] [address] Phone: [phone] Fax: [phone]   Your procedure is scheduled on Wednesday, July 08, 2018  Report to [facility] Entrance A at 9:45 A.M.  Call this number if you have problems the morning of surgery:  [phone]   Remember:  Do not eat or drink after midnight.    Take these medicines the morning of surgery with A SIP OF WATER  gabapentin (NEURONTIN) 100mg   pregabalin (LYRICA)  pyridostigmine (MESTINON)  Follow your surgeon's instructions on when to stop Aspirin.  If no instructions were given by your surgeon then you will need to call the office to get those instructions.    7 days prior to surgery STOP taking any Aspirin (unless otherwise instructed by your surgeon), Aleve, Naproxen, Ibuprofen, Motrin, Advil, Goody's, BC's, all herbal medications, fish oil, and all vitamins.  How to Manage Your Diabetes Before and After Surgery  Why is it important to control my blood sugar before and after surgery? . Improving blood sugar levels before and after surgery helps healing and can limit problems. . A way of improving blood sugar control is eating a healthy diet by: o  Eating less sugar and carbohydrates o  Increasing activity/exercise o  Talking with your doctor about reaching your blood sugar goals . High blood sugars (greater than 180 mg/dL) can raise your risk of infections and slow your recovery, so you will need to focus on controlling your diabetes during the weeks before surgery. . Make sure that the doctor who takes care of your diabetes knows about your planned surgery including the date and location.  How do I manage my blood sugar before surgery? . Check your blood sugar at least 4 times a day, starting 2 days before surgery, to make sure that the level is not too high or low. o Check your blood sugar the morning of your surgery  when you wake up and every 2 hours until you get to the Short Stay unit. . If your blood sugar is less than 70 mg/dL, you will need to treat for low blood sugar: o Do not take insulin. o Treat a low blood sugar (less than 70 mg/dL) with  cup of clear juice (cranberry or apple), 4 glucose tablets, OR glucose gel. Recheck blood sugar in 15 minutes after treatment (to make sure it is greater than 70 mg/dL). If your blood sugar is not greater than 70 mg/dL on recheck, call [phone] o  for further instructions. . Report your blood sugar to the short stay nurse when you get to Short Stay.  . If you are admitted to the hospital after surgery: o Your blood sugar will be checked by the staff and you will probably be given insulin after surgery (instead of oral diabetes medicines) to make sure you have good blood sugar levels. o The goal for blood sugar control after surgery is 80-180 mg/dL.  WHAT DO I DO ABOUT MY DIABETES MEDICATION?  Marland Kitchen Do not take glipiZIDE (GLUCOTROL XL) /oral diabetes medicines (pills) the morning of surgery. . Do not take sitaGLIPtin (JANUVIA)  /oral diabetes medicines (pills) the morning of surgery. . Do not take  of glipiZIDE (GLUCOTROL XL) the evening of before surgery  . The day of surgery, do not take other diabetes injectables, including Byetta (exenatide), Bydureon (exenatide ER), Victoza (liraglutide), or Trulicity (dulaglutide).  Reviewed and Endorsed by [facility] Patient Education Committee, August 2015   Do not wear jewelry, make-up or nail polish.  Do not wear lotions, powders, or perfumes, or deodorant.  Do not shave 48 hours prior to surgery.  Men may shave face and neck.  Do not bring valuables to the hospital.  [facility] is not responsible for any belongings or valuables.  Contacts, dentures or bridgework may not be worn into surgery.  Leave your suitcase in the car.  After surgery it may be brought to your room.  For patients admitted to the  hospital, discharge time will be determined by your treatment team.  Patients discharged the day of surgery will not be allowed to drive home.   Special instructions:  [name]- Preparing For Surgery  Before surgery, you can play an important role. Because skin is not sterile, your skin needs to be as free of germs as possible. You can reduce the number of germs on your skin by washing with CHG (chlorahexidine gluconate) Soap before surgery.  CHG is an antiseptic cleaner which kills germs and bonds with the skin to continue killing germs even after washing.    Oral Hygiene is also important to reduce your risk of infection.  Remember - BRUSH YOUR TEETH THE MORNING OF SURGERY WITH YOUR REGULAR TOOTHPASTE  Please do not use if you have an allergy to CHG or antibacterial soaps. If your skin becomes reddened/irritated stop using the CHG.  Do not shave (including legs and underarms) for at least 48 hours prior to first CHG shower. It is OK to shave your face.  Please follow these instructions carefully.   1. Shower the NIGHT BEFORE SURGERY and the MORNING OF SURGERY with CHG.   2. If you chose to wash your hair, wash your hair first as usual with your normal shampoo.  3. After you shampoo, rinse your hair and body thoroughly to remove the shampoo.  4. Use CHG as you would any other liquid soap. You can apply CHG directly to the skin and wash gently with a scrungie or a clean washcloth.   5. Apply the CHG Soap to your body ONLY FROM THE NECK DOWN.  Do not use on open wounds or open sores. Avoid contact with your eyes, ears, mouth and genitals (private parts). Wash Face and genitals (private parts)  with your normal soap.  6. Wash thoroughly, paying special attention to the area where your surgery will be performed.  7. Thoroughly rinse your body with warm water from the neck down.  8. DO NOT shower/wash with your normal soap after using and rinsing off the CHG Soap.  9. Pat yourself dry  with a CLEAN TOWEL.  10. Wear CLEAN PAJAMAS to bed the night before surgery, wear comfortable clothes the morning of surgery  11. Place CLEAN SHEETS on your bed the night of your first shower and DO NOT SLEEP WITH PETS.  Day of Surgery:  Do not apply any deodorants/lotions.  Please wear clean clothes to the hospital/surgery center.   Remember to brush your teeth WITH YOUR REGULAR TOOTHPASTE.     Please read over the following fact sheets that you were given. Pain Booklet, MRSA Information and Surgical Site Infection Prevention

## 2018-07-06 ENCOUNTER — Encounter (HOSPITAL_COMMUNITY): Payer: Self-pay

## 2018-07-06 ENCOUNTER — Encounter (HOSPITAL_COMMUNITY)
Admission: RE | Admit: 2018-07-06 | Discharge: 2018-07-06 | Disposition: A | Discharge status: Home / Self Care | Payer: PPO | Source: Ambulatory Visit | Attending: Thoracic Surgery (Cardiothoracic Vascular Surgery) | Admitting: Thoracic Surgery (Cardiothoracic Vascular Surgery)

## 2018-07-06 ENCOUNTER — Other Ambulatory Visit: Payer: Self-pay

## 2018-07-06 ENCOUNTER — Encounter (HOSPITAL_COMMUNITY): Payer: Self-pay | Admitting: Vascular Surgery

## 2018-07-06 ENCOUNTER — Ambulatory Visit (HOSPITAL_COMMUNITY)
Admission: RE | Admit: 2018-07-06 | Discharge: 2018-07-06 | Disposition: A | Discharge status: Home / Self Care | Payer: PPO | Source: Ambulatory Visit | Attending: Thoracic Surgery (Cardiothoracic Vascular Surgery) | Admitting: Thoracic Surgery (Cardiothoracic Vascular Surgery)

## 2018-07-06 DIAGNOSIS — R918 Other nonspecific abnormal finding of lung field: Secondary | ICD-10-CM | POA: Diagnosis not present

## 2018-07-06 DIAGNOSIS — J849 Interstitial pulmonary disease, unspecified: Secondary | ICD-10-CM | POA: Diagnosis not present

## 2018-07-06 HISTORY — DX: Myasthenia gravis without (acute) exacerbation: G70.00

## 2018-07-06 HISTORY — DX: Dyspnea, unspecified: R06.00

## 2018-07-06 LAB — APTT: aPTT: 42 seconds — ABNORMAL HIGH (ref 24–36)

## 2018-07-06 LAB — URINALYSIS, ROUTINE W REFLEX MICROSCOPIC
Bilirubin Urine: NEGATIVE
Glucose, UA: NEGATIVE mg/dL
Hgb urine dipstick: NEGATIVE
Ketones, ur: NEGATIVE mg/dL
Leukocytes,Ua: NEGATIVE
Nitrite: NEGATIVE
Protein, ur: NEGATIVE mg/dL
Specific Gravity, Urine: 1.009 (ref 1.005–1.030)
pH: 5 (ref 5.0–8.0)

## 2018-07-06 LAB — COMPREHENSIVE METABOLIC PANEL
ALBUMIN: 3.9 g/dL (ref 3.5–5.0)
ALK PHOS: 53 U/L (ref 38–126)
ALT: 27 U/L (ref 0–44)
AST: 21 U/L (ref 15–41)
Anion gap: 11 (ref 5–15)
BUN: 16 mg/dL (ref 8–23)
CO2: 19 mmol/L — ABNORMAL LOW (ref 22–32)
Calcium: 10 mg/dL (ref 8.9–10.3)
Chloride: 109 mmol/L (ref 98–111)
Creatinine, Ser: 0.67 mg/dL (ref 0.44–1.00)
GFR calc Af Amer: >60 mL/min (ref >60)
GFR calc non Af Amer: >60 mL/min (ref >60)
GLUCOSE: 92 mg/dL (ref 70–99)
Potassium: 3.5 mmol/L (ref 3.5–5.1)
Sodium: 139 mmol/L (ref 135–145)
Total Bilirubin: 0.4 mg/dL (ref 0.3–1.2)
Total Protein: 6.6 g/dL (ref 6.5–8.1)

## 2018-07-06 LAB — CBC
HCT: 38.4 % (ref 36.0–46.0)
Hemoglobin: 12.5 g/dL (ref 12.0–15.0)
MCH: 30.4 pg (ref 26.0–34.0)
MCHC: 32.6 g/dL (ref 30.0–36.0)
MCV: 93.4 fL (ref 80.0–100.0)
Platelets: 242 10*3/uL (ref 150–400)
RBC: 4.11 MIL/uL (ref 3.87–5.11)
RDW: 12.1 % (ref 11.5–15.5)
WBC: 6.9 10*3/uL (ref 4.0–10.5)
nRBC: 0 % (ref 0.0–0.2)

## 2018-07-06 LAB — BLOOD GAS, ARTERIAL
Acid-base deficit: 0.3 mmol/L (ref 0.0–2.0)
Bicarbonate: 23 mmol/L (ref 20.0–28.0)
Drawn by: 42180
FIO2: 21
O2 Saturation: 99 %
Patient temperature: 98.6
pCO2 arterial: 32.7 mmHg (ref 32.0–48.0)
pH, Arterial: 7.461 — ABNORMAL HIGH (ref 7.350–7.450)
pO2, Arterial: 137 mmHg — ABNORMAL HIGH (ref 83.0–108.0)

## 2018-07-06 LAB — GLUCOSE, CAPILLARY: Glucose-Capillary: 156 mg/dL — ABNORMAL HIGH (ref 70–99)

## 2018-07-06 LAB — SURGICAL PCR SCREEN
MRSA, PCR: NEGATIVE
STAPHYLOCOCCUS AUREUS: POSITIVE — AB

## 2018-07-06 LAB — TYPE AND SCREEN
ABO/RH(D): O NEG
Antibody Screen: NEGATIVE

## 2018-07-06 LAB — PROTIME-INR
INR: 1.1 (ref 0.8–1.2)
Prothrombin Time: 13.6 seconds (ref 11.4–15.2)

## 2018-07-06 LAB — HEMOGLOBIN A1C
Hgb A1c MFr Bld: 6.7 % — ABNORMAL HIGH (ref 4.8–5.6)
Mean Plasma Glucose: 145.59 mg/dL

## 2018-07-06 LAB — ABO/RH: ABO/RH(D): O NEG

## 2018-07-06 NOTE — Progress Notes (Signed)
PCP - Dione Housekeeper Cardiologist - Dr. Claudie Leach  Chest x-ray - 07/06/18 EKG - 07/06/18 Stress Test -denies  ECHO - 01/2018-requested from [facility] Cardiac Cath - denies  Sleep Study - OSA+  CPAP - uses CPAP nightly   Fasting Blood Sugar - 110-150 Checks Blood Sugar 1 time a day  Blood Thinner Instructions:N/A Aspirin Instructions:N/A  Anesthesia review: Yes, per consult.   Patient denies shortness of breath, fever, cough and chest pain at PAT appointment  Pt shows skin discoloration to LLE; Ebony Hail, [place] notified.   Patient verbalized understanding of instructions that were given to them at the PAT appointment. Patient was also instructed that they will need to review over the PAT instructions again at home before surgery.

## 2018-07-06 NOTE — Progress Notes (Addendum)
Anesthesia PAT Evaluation:  Case:  [id number] Date/Time:  07/08/18 1141   Procedures:      VIDEO ASSISTED THORACOSCOPY (Right Chest)     LUNG BIOPSY (Right )   Anesthesia type:  General   Pre-op diagnosis:  ILD   Location:  [facility] OR ROOM 92 / [place] OR   Surgeon:  Melrose Nakayama, MD      DISCUSSION: Patient is a 73 year old female scheduled for the above procedure.  Anesthesia consult requested due to patient's history of OSA and myasthenia gravis. Dr. Roxan Hockey has told her she is high risk due to obesity, obstructive sleep apnea, and myasthenia gravis.  History includes never smoker, myasthenia gravis (ocular, left; on pyridostigmine), OSA (on CPAP), ILD, HTN, HLD, GERD, fibromyalgia, DM2 (with neuropathy), dyspnea, osteoarthritis (anti-CPP positive; RF, ANA, ENA, pan ANCA negative), C6-7 ACDF 09/19/05. Coronary calcifications on Chest CT (negative stress test 2017). BMI is consistent with obesity. - Treated for cellulitis over the bridge of her nose on 07/02/18. Three weeks prior she noticed an area of redness over where her CPAP mask or her glasses rest. It developed into a pustule that ruptured. Her chiropractor was concerned it could be community acquired MRSA and recommended further evaluation by her PCP. She was given a 10 day course of doxycyline. (Since then she feels the area is improving and the drainage has subsided, but still with ~ 10 x 12 mm area of erythema with central ~ 5 mm blackish eschar.)  Dr. Roxan Hockey classified patient's Zubrod Score as: [x] ?    2    Ambulatory and capable of self care, unable to do work activities, up and about >50 % of waking hours - She participates in pulmonary rehab.        Her ocular myasthenia gravis symptoms have remained stable since her 07/2017 neurology visit. She has not had any surgeries since her diagnosis of MG, but no known prior anesthesia complications. Advised that she continue Mestinon. Discussed that she may require post-operative  mechanical ventilatory support given her history. She will bring her CPAP equipment.  Reviewed OSA and MG history with anesthesiologist Suzette Battiest, MD.   Staff message sent to Dr. Roxan Hockey and [facility] RN Thurmond Butts regarding PTT 42, on-going treatment for cellulitis over the bridge of her nose, and LLE discoloration (see description below). No fever. CBC WNL. On doxycycline at present. PAT CXR report is still in process.   VS: BP (!) 121/57   Pulse 65   Temp 36.4 C   Resp 18   Ht 5\' 4"  (1.626 m)   Wt 91.9 kg   SpO2 98%   BMI 34.78 kg/m  Patient is a pleasant Caucasian female in NAD. Her husband is with her. No conversational dyspnea noted. She denied chest pain, SOB at rest. Pulmonary Rehab has helped her a bit and with day to day activities does not note much DOE unless walking up stairs or inclines. She does not use home O2. Her OSA pressures are currently at 14. Heart RRR, no murmur noted. Lungs clear. No carotid bruits noted. Mild BLE edema. She is wearing compression stockings. She wears a right ankle brace. She has a ~ 1 cm red papular lesion on her left lower leg, above the medial ankle. There is ~ 7 x 4.5 cm surrounding area that has a petechial-like rash (rather than diffuse erythema) without no open sores/excoriatrions or drainage. No calf tenderness. She isn't sure if she was bitten by an insect. There is not a  bullseye pattern. She noticed area today after her compression stocking rolled down and bunched up just below her left calf.   PROVIDERS: Dione Housekeeper, MD is PCP [facility] Everywhere) - Antony Contras, MD is neurologist. Last visit 08/19/17. Droopy left eyelid started in 03/2015 and noted to have elevated acetylcholine receptor antibodies on 01/15/16 and was started on Mestinon. At last visit, she denied any eyelid drooping, diplopia, or eye muscle weakness. Some generalized fatigue possibly due to OSA and deconditioning. Taking Mestinon 30 mg QID as unable to tolerate 60 mg  due to diarrhea and GI upset. Some intermittent dysphagia (which has improved by her 05/2018 visit with [facility]). Dr. Leonie Man did not recommend additional medication changed and advised PRN neurology follow-up. Bo Merino, MD is rheumatologist. Last visit 01/30/18.  Brand Males, MD is pulmonologist. 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 Last visit 06/02/18. ILD was felt stable, but he referred patient to CT surgery for consideration of biopsy since basis for ILD not known.  Lawson Radar, MD is cardiologist [facility]). Last visit 02/24/18.  Enid Derry, MD is Sleep Medicine [facility]). Within the past few months she reports getting a new CPAP mask. Pressure setting currently at 14. Last visit 05/28/18.   LABS: Preoperative labs noted. PTT 42, routed to surgeon and [place].  (all labs ordered are listed, but only abnormal results are displayed)  Labs Reviewed  SURGICAL PCR SCREEN - Abnormal; Notable for the following components:      Result Value   Staphylococcus aureus POSITIVE (*)    All other components within normal limits  GLUCOSE, CAPILLARY - Abnormal; Notable for the following components:   Glucose-Capillary 156 (*)    All other components within normal limits  APTT - Abnormal; Notable for the following components:   aPTT 42 (*)    All other components within normal limits  BLOOD GAS, ARTERIAL - Abnormal; Notable for the following components:   pH, Arterial 7.461 (*)    pO2, Arterial 137 (*)    All other components within normal limits  COMPREHENSIVE METABOLIC PANEL - Abnormal; Notable for the following components:   CO2 19 (*)    All other components within normal limits  URINALYSIS, ROUTINE W REFLEX MICROSCOPIC - Abnormal; Notable for the following components:   APPearance HAZY (*)    All other components within normal limits  HEMOGLOBIN A1C - Abnormal;  Notable for the following components:   Hgb A1c MFr Bld 6.7 (*)    All other components within normal limits  CBC  PROTIME-INR  TYPE AND SCREEN     OTHER: PFTs 06/02/18:  FVC 2.59 (91%)  FEV1 2.38 (110%) FEV1/FVC 92% (121%) DLCO unc 15.58 (83%)   Simple office walk 185 feet x  3 laps goal with forehead probe 03/03/2018  06/02/2018   O2 used Room air Room air  Number laps completed 3 3  Comments about pace normal normal  Resting Pulse Ox/HR 100% and 66/min 98% and 72/min  Final Pulse Ox/HR 99% and 108/min 96% and 57/mn  Desaturated </= 88% no no  Desaturated <= 3% points no no  Got Tachycardic >/= 90/min yes no  Symptoms at end of test x Mild dyspnea  Miscellaneous comments x     IMAGES: CXR 07/06/18: In process.  Esophagram/Barium Swallow 02/10/18: IMPRESSION: Mild incomplete relaxation of the cricopharyngeus, but otherwise normal for age esophagram.  CT Chest  High Resolutation 01/29/18: IMPRESSION: 1. Continued stability of basilar predominant fibrotic interstitial lung disease without frank honeycombing. Findings are considered most compatible with an alternative diagnosis to UIP per ATS consensus criteria. 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 2. Mild patchy air trapping in both lungs, indicative of small airways disease. 3. Three-vessel coronary atherosclerosis.   EKG: 07/06/18: SR with first degree AV block.   CV: Echo 02/24/18 ([place]): Conclusions:  1. There is mild concentric ventricular hypertrophy. 2. Normal study: Normal cardiac chamber sizes and function. Normal valve anatomy and function. No pericardial effusion or intracardiac mass. No intracardiac shunts by 2D and color flow imaging. Normal thoracic aorta and aortic arch.  Carotid US 02/24/18 ([place]): Conclusions: No hemodynamically significant stenosis in either the right or left carotid arterial system. (Comparison 02/18/17 US showed < 39%  BICA stenosis.)  BLE arterial Duplex with ABI 02/24/18 ([place]): Conclusion: - No hemodynamically significant stenoses in either right or left lower extremity arterial system. - Normal ankle/brachial indices with right and left ABI ratio > 0.90 and < 1.25.   Nuclear stress test 08/31/15 ([place]): IMPRESSION: The resting ECG shows NSR. The resting and stress ECG shos normal ST segment and no VT, significant QRS prolongation or heat block. Both the rest and stress images are with normal limits. No significant reversible ischemia or fixed scar.  Gated LVEF is normal (77%). Normal LV wall motion.    Past Medical History:  Diagnosis Date  . Arthritis   . Bursitis   . Diabetes mellitus without complication ([place])   . Dyspnea   . Fibromyalgia   . GERD (gastroesophageal reflux disease)   . Hyperlipemia   . Hypertension   . Myasthenia gravis ([place])    ocular  . Sinusitis   . Sleep apnea    uses a cpap  . Sleep apnea   . Wears glasses     Past Surgical History:  Procedure Laterality Date  . ABDOMINAL HYSTERECTOMY  1999  . BREAST SURGERY  2002   rt br bx  . CERVICAL FUSION  2007  . CHOLECYSTECTOMY  2005   lap choli  . COLONOSCOPY    . FINGER GANGLION CYST EXCISION    . INCISION AND DRAINAGE ABSCESS     Staph infection with multiple abdominal wounds s/p I&D in [place] ~ 2014  . SHOULDER ARTHROSCOPY WITH OPEN ROTATOR CUFF REPAIR AND DISTAL CLAVICLE ACROMINECTOMY Right 09/09/2012   Procedure: Right Shoulder Arthroscopy with distal clavicle excision and mini-open rotator cuff repair.;  Surgeon: Alta Corning, MD;  Location: [facility];  Service: Orthopedics;  Laterality: Right;  . TONSILLECTOMY      MEDICATIONS: . ACCU-CHEK AVIVA PLUS test strip  . ACCU-CHEK SOFTCLIX LANCETS lancets  . APPLE CIDER VINEGAR PO  . Biotin w/ Vitamins C & E (HAIR/SKIN/NAILS PO)  . Calcium Carb-Cholecalciferol (CALCIUM+D3) 600-800 MG-UNIT TABS  . cetirizine (ZYRTEC) 10 MG  tablet  . Co-Enzyme Q-10 100 MG CAPS  . diclofenac sodium (VOLTAREN) 1 % GEL  . fluticasone (FLONASE) 50 MCG/ACT nasal spray  . gabapentin (NEURONTIN) 100 MG capsule  . gabapentin (NEURONTIN) 300 MG capsule  . Ginger, Zingiber officinalis, (GINGER ROOT) 550 MG CAPS  . glipiZIDE (GLUCOTROL XL) 10 MG 24 hr tablet  . levOCARNitine Fumarate 500 MG TABS  . losartan-hydrochlorothiazide (HYZAAR) 100-25 MG tablet  . metFORMIN (GLUCOPHAGE-XR) 500 MG 24 hr tablet  . Misc Natural Products (TART CHERRY ADVANCED) CAPS  . Misc. Devices MISC  .  multivitamin-lutein (OCUVITE-LUTEIN) CAPS capsule  . Nutritional Supplements (EQ ESTROBLEND MENOPAUSE) TABS  . omega-3 acid ethyl esters (LOVAZA) 1 g capsule  . pregabalin (LYRICA) 50 MG capsule  . pyridostigmine (MESTINON) 60 MG tablet  . Rosuvastatin Calcium 10 MG CPSP  . sitaGLIPtin (JANUVIA) 100 MG tablet  . Turmeric Curcumin 500 MG CAPS   No current facility-administered medications for this encounter.     Myra Gianotti, PA-C Surgical Short Stay/Anesthesiology [facility] Phone [phone] [facility] Phone [phone] 07/06/2018 10:51 PM

## 2018-07-06 NOTE — Anesthesia Preprocedure Evaluation (Deleted)
Anesthesia Evaluation    Airway        Dental   Pulmonary           Cardiovascular hypertension,      Neuro/Psych    GI/Hepatic   Endo/Other  diabetes  Renal/GU      Musculoskeletal   Abdominal   Peds  Hematology   Anesthesia Other Findings   Reproductive/Obstetrics                             Anesthesia Physical Anesthesia Plan  ASA:   Anesthesia Plan:    Post-op Pain Management:    Induction:   PONV Risk Score and Plan:   Airway Management Planned:   Additional Equipment:   Intra-op Plan:   Post-operative Plan:   Informed Consent:   Plan Discussed with:   Anesthesia Plan Comments: (See PAT note written 07/06/2018 by Myra Gianotti, PA-C. Patient has history of OSA, ILD, ocular MG (on Mestinon). )        Anesthesia Quick Evaluation

## 2018-07-06 NOTE — Pre-Procedure Instructions (Addendum)
MELONI HINZ  07/06/2018     [name] - [place], [place] [address] Phone: [phone] Fax: [phone]   Your procedure is scheduled on Wednesday, July 08, 2018  Report to [facility] Entrance A at 9:45 A.M.  Call this number if you have problems the morning of surgery:  [phone]   Remember:  Do not eat or drink after midnight.    Take these medicines the morning of surgery with A SIP OF WATER  gabapentin (NEURONTIN) 100mg   pregabalin (LYRICA)  pyridostigmine (MESTINON)   7 days prior to surgery STOP taking any Aspirin (unless otherwise instructed by your surgeon), Aleve, Naproxen, Ibuprofen, Motrin, Advil, Goody's, BC's, all herbal medications, fish oil, and all vitamins. This includes: diclofenac (VOLTREN gel).  How to Manage Your Diabetes Before and After Surgery  Why is it important to control my blood sugar before and after surgery? . Improving blood sugar levels before and after surgery helps healing and can limit problems. . A way of improving blood sugar control is eating a healthy diet by: o  Eating less sugar and carbohydrates o  Increasing activity/exercise o  Talking with your doctor about reaching your blood sugar goals . High blood sugars (greater than 180 mg/dL) can raise your risk of infections and slow your recovery, so you will need to focus on controlling your diabetes during the weeks before surgery. . Make sure that the doctor who takes care of your diabetes knows about your planned surgery including the date and location.  How do I manage my blood sugar before surgery? . Check your blood sugar at least 4 times a day, starting 2 days before surgery, to make sure that the level is not too high or low. o Check your blood sugar the morning of your surgery when you wake up and every 2 hours until you get to the Short Stay unit. . If your blood sugar is less than 70 mg/dL, you will need to  treat for low blood sugar: o Do not take insulin. o Treat a low blood sugar (less than 70 mg/dL) with  cup of clear juice (cranberry or apple), 4 glucose tablets, OR glucose gel. Recheck blood sugar in 15 minutes after treatment (to make sure it is greater than 70 mg/dL). If your blood sugar is not greater than 70 mg/dL on recheck, call [phone] o  for further instructions. . Report your blood sugar to the short stay nurse when you get to Short Stay.  . If you are admitted to the hospital after surgery: o Your blood sugar will be checked by the staff and you will probably be given insulin after surgery (instead of oral diabetes medicines) to make sure you have good blood sugar levels. o The goal for blood sugar control after surgery is 80-180 mg/dL.  WHAT DO I DO ABOUT MY DIABETES MEDICATION?  Marland Kitchen Do not take glipiZIDE (GLUCOTROL XL), Januvia, or Metformin the morning of surgery.  . Do not take glipiZIDE (GLUCOTROL XL) the evening of before surgery  Reviewed and Endorsed by [facility] Patient Education Committee, August 2015   Do not wear jewelry, make-up or nail polish.  Do not wear lotions, powders, or perfumes, or deodorant.  Do not shave 48 hours prior to surgery.  Men may shave face and neck.  Do not bring valuables to the hospital.  [facility] is not responsible for any belongings or valuables.  Contacts, dentures or bridgework may not be worn into surgery.  Leave your suitcase in the car.  After surgery it may be brought to your room.  For patients admitted to the hospital, discharge time will be determined by your treatment team.  Patients discharged the day of surgery will not be allowed to drive home.   Special instructions:  [facility]- Preparing For Surgery  Before surgery, you can play an important role. Because skin is not sterile, your skin needs to be as free of germs as possible. You can reduce the number of germs on your skin by washing with CHG  (chlorahexidine gluconate) Soap before surgery.  CHG is an antiseptic cleaner which kills germs and bonds with the skin to continue killing germs even after washing.    Oral Hygiene is also important to reduce your risk of infection.  Remember - BRUSH YOUR TEETH THE MORNING OF SURGERY WITH YOUR REGULAR TOOTHPASTE  Please do not use if you have an allergy to CHG or antibacterial soaps. If your skin becomes reddened/irritated stop using the CHG.  Do not shave (including legs and underarms) for at least 48 hours prior to first CHG shower. It is OK to shave your face.  Please follow these instructions carefully.   1. Shower the NIGHT BEFORE SURGERY and the MORNING OF SURGERY with CHG.   2. If you chose to wash your hair, wash your hair first as usual with your normal shampoo.  3. After you shampoo, rinse your hair and body thoroughly to remove the shampoo.  4. Use CHG as you would any other liquid soap. You can apply CHG directly to the skin and wash gently with a scrungie or a clean washcloth.   5. Apply the CHG Soap to your body ONLY FROM THE NECK DOWN.  Do not use on open wounds or open sores. Avoid contact with your eyes, ears, mouth and genitals (private parts). Wash Face and genitals (private parts)  with your normal soap.  6. Wash thoroughly, paying special attention to the area where your surgery will be performed.  7. Thoroughly rinse your body with warm water from the neck down.  8. DO NOT shower/wash with your normal soap after using and rinsing off the CHG Soap.  9. Pat yourself dry with a CLEAN TOWEL.  10. Wear CLEAN PAJAMAS to bed the night before surgery, wear comfortable clothes the morning of surgery  11. Place CLEAN SHEETS on your bed the night of your first shower and DO NOT SLEEP WITH PETS.  Day of Surgery:  Do not apply any deodorants/lotions.  Please wear clean clothes to the hospital/surgery center.   Remember to brush your teeth WITH YOUR REGULAR  TOOTHPASTE.     Please read over the following fact sheets that you were given. Pain Booklet, MRSA Information and Surgical Site Infection Prevention

## 2018-07-07 ENCOUNTER — Other Ambulatory Visit: Payer: Self-pay | Admitting: *Deleted

## 2018-07-07 DIAGNOSIS — J849 Interstitial pulmonary disease, unspecified: Secondary | ICD-10-CM

## 2018-07-08 ENCOUNTER — Other Ambulatory Visit: Payer: Self-pay | Admitting: *Deleted

## 2018-07-08 ENCOUNTER — Inpatient Hospital Stay (HOSPITAL_COMMUNITY)
Admission: RE | Admit: 2018-07-08 | Payer: PPO | Source: Home / Self Care | Admitting: Thoracic Surgery (Cardiothoracic Vascular Surgery)

## 2018-07-08 ENCOUNTER — Encounter (HOSPITAL_COMMUNITY): Admission: RE | Payer: Self-pay | Source: Home / Self Care

## 2018-07-08 DIAGNOSIS — J849 Interstitial pulmonary disease, unspecified: Secondary | ICD-10-CM

## 2018-07-08 SURGERY — VIDEO ASSISTED THORACOSCOPY
Anesthesia: General | Site: Chest | Laterality: Right

## 2018-07-09 DIAGNOSIS — J849 Interstitial pulmonary disease, unspecified: Secondary | ICD-10-CM | POA: Diagnosis not present

## 2018-07-09 LAB — APTT: aPTT: 29 s (ref 22–34)

## 2018-07-29 DIAGNOSIS — D485 Neoplasm of uncertain behavior of skin: Secondary | ICD-10-CM | POA: Diagnosis not present

## 2018-07-29 DIAGNOSIS — I5022 Chronic systolic (congestive) heart failure: Secondary | ICD-10-CM | POA: Diagnosis not present

## 2018-07-29 DIAGNOSIS — E119 Type 2 diabetes mellitus without complications: Secondary | ICD-10-CM | POA: Diagnosis not present

## 2018-07-29 DIAGNOSIS — I2510 Atherosclerotic heart disease of native coronary artery without angina pectoris: Secondary | ICD-10-CM | POA: Diagnosis not present

## 2018-07-29 DIAGNOSIS — I255 Ischemic cardiomyopathy: Secondary | ICD-10-CM | POA: Diagnosis not present

## 2018-07-29 DIAGNOSIS — Z951 Presence of aortocoronary bypass graft: Secondary | ICD-10-CM | POA: Diagnosis not present

## 2018-07-29 DIAGNOSIS — Z87891 Personal history of nicotine dependence: Secondary | ICD-10-CM | POA: Diagnosis not present

## 2018-07-29 DIAGNOSIS — I110 Hypertensive heart disease with heart failure: Secondary | ICD-10-CM | POA: Diagnosis not present

## 2018-07-29 DIAGNOSIS — I447 Left bundle-branch block, unspecified: Secondary | ICD-10-CM | POA: Diagnosis not present

## 2018-07-29 DIAGNOSIS — D0439 Carcinoma in situ of skin of other parts of face: Secondary | ICD-10-CM | POA: Diagnosis not present

## 2018-08-03 DIAGNOSIS — S338XXA Sprain of other parts of lumbar spine and pelvis, initial encounter: Secondary | ICD-10-CM | POA: Diagnosis not present

## 2018-08-03 DIAGNOSIS — M9903 Segmental and somatic dysfunction of lumbar region: Secondary | ICD-10-CM | POA: Diagnosis not present

## 2018-08-03 DIAGNOSIS — M47816 Spondylosis without myelopathy or radiculopathy, lumbar region: Secondary | ICD-10-CM | POA: Diagnosis not present

## 2018-08-03 DIAGNOSIS — I4891 Unspecified atrial fibrillation: Secondary | ICD-10-CM | POA: Diagnosis not present

## 2018-08-03 DIAGNOSIS — E039 Hypothyroidism, unspecified: Secondary | ICD-10-CM | POA: Diagnosis not present

## 2018-08-03 DIAGNOSIS — Z862 Personal history of diseases of the blood and blood-forming organs and certain disorders involving the immune mechanism: Secondary | ICD-10-CM | POA: Diagnosis not present

## 2018-08-06 DIAGNOSIS — M9903 Segmental and somatic dysfunction of lumbar region: Secondary | ICD-10-CM | POA: Diagnosis not present

## 2018-08-06 DIAGNOSIS — M545 Low back pain: Secondary | ICD-10-CM | POA: Diagnosis not present

## 2018-08-06 DIAGNOSIS — M62830 Muscle spasm of back: Secondary | ICD-10-CM | POA: Diagnosis not present

## 2018-08-06 DIAGNOSIS — M47816 Spondylosis without myelopathy or radiculopathy, lumbar region: Secondary | ICD-10-CM | POA: Diagnosis not present

## 2018-08-06 DIAGNOSIS — S338XXA Sprain of other parts of lumbar spine and pelvis, initial encounter: Secondary | ICD-10-CM | POA: Diagnosis not present

## 2018-08-06 DIAGNOSIS — M6281 Muscle weakness (generalized): Secondary | ICD-10-CM | POA: Diagnosis not present

## 2018-08-10 DIAGNOSIS — S338XXA Sprain of other parts of lumbar spine and pelvis, initial encounter: Secondary | ICD-10-CM | POA: Diagnosis not present

## 2018-08-10 DIAGNOSIS — M47816 Spondylosis without myelopathy or radiculopathy, lumbar region: Secondary | ICD-10-CM | POA: Diagnosis not present

## 2018-08-10 DIAGNOSIS — M9903 Segmental and somatic dysfunction of lumbar region: Secondary | ICD-10-CM | POA: Diagnosis not present

## 2018-08-12 DIAGNOSIS — D0439 Carcinoma in situ of skin of other parts of face: Secondary | ICD-10-CM | POA: Diagnosis not present

## 2018-08-13 DIAGNOSIS — S338XXA Sprain of other parts of lumbar spine and pelvis, initial encounter: Secondary | ICD-10-CM | POA: Diagnosis not present

## 2018-08-13 DIAGNOSIS — M9903 Segmental and somatic dysfunction of lumbar region: Secondary | ICD-10-CM | POA: Diagnosis not present

## 2018-08-13 DIAGNOSIS — M47816 Spondylosis without myelopathy or radiculopathy, lumbar region: Secondary | ICD-10-CM | POA: Diagnosis not present

## 2018-08-17 DIAGNOSIS — M47816 Spondylosis without myelopathy or radiculopathy, lumbar region: Secondary | ICD-10-CM | POA: Diagnosis not present

## 2018-08-17 DIAGNOSIS — S338XXA Sprain of other parts of lumbar spine and pelvis, initial encounter: Secondary | ICD-10-CM | POA: Diagnosis not present

## 2018-08-17 DIAGNOSIS — M9903 Segmental and somatic dysfunction of lumbar region: Secondary | ICD-10-CM | POA: Diagnosis not present

## 2018-08-20 DIAGNOSIS — M47816 Spondylosis without myelopathy or radiculopathy, lumbar region: Secondary | ICD-10-CM | POA: Diagnosis not present

## 2018-08-20 DIAGNOSIS — M9903 Segmental and somatic dysfunction of lumbar region: Secondary | ICD-10-CM | POA: Diagnosis not present

## 2018-08-20 DIAGNOSIS — S338XXA Sprain of other parts of lumbar spine and pelvis, initial encounter: Secondary | ICD-10-CM | POA: Diagnosis not present

## 2018-08-24 DIAGNOSIS — M9903 Segmental and somatic dysfunction of lumbar region: Secondary | ICD-10-CM | POA: Diagnosis not present

## 2018-08-24 DIAGNOSIS — M47816 Spondylosis without myelopathy or radiculopathy, lumbar region: Secondary | ICD-10-CM | POA: Diagnosis not present

## 2018-08-24 DIAGNOSIS — S338XXA Sprain of other parts of lumbar spine and pelvis, initial encounter: Secondary | ICD-10-CM | POA: Diagnosis not present

## 2018-08-27 DIAGNOSIS — I10 Essential (primary) hypertension: Secondary | ICD-10-CM | POA: Diagnosis not present

## 2018-08-27 DIAGNOSIS — M47816 Spondylosis without myelopathy or radiculopathy, lumbar region: Secondary | ICD-10-CM | POA: Diagnosis not present

## 2018-08-27 DIAGNOSIS — Z9989 Dependence on other enabling machines and devices: Secondary | ICD-10-CM | POA: Diagnosis not present

## 2018-08-27 DIAGNOSIS — S338XXA Sprain of other parts of lumbar spine and pelvis, initial encounter: Secondary | ICD-10-CM | POA: Diagnosis not present

## 2018-08-27 DIAGNOSIS — M9903 Segmental and somatic dysfunction of lumbar region: Secondary | ICD-10-CM | POA: Diagnosis not present

## 2018-08-27 DIAGNOSIS — G4733 Obstructive sleep apnea (adult) (pediatric): Secondary | ICD-10-CM | POA: Diagnosis not present

## 2018-08-27 DIAGNOSIS — E119 Type 2 diabetes mellitus without complications: Secondary | ICD-10-CM | POA: Diagnosis not present

## 2018-09-03 DIAGNOSIS — M47816 Spondylosis without myelopathy or radiculopathy, lumbar region: Secondary | ICD-10-CM | POA: Diagnosis not present

## 2018-09-03 DIAGNOSIS — M9903 Segmental and somatic dysfunction of lumbar region: Secondary | ICD-10-CM | POA: Diagnosis not present

## 2018-09-03 DIAGNOSIS — S338XXA Sprain of other parts of lumbar spine and pelvis, initial encounter: Secondary | ICD-10-CM | POA: Diagnosis not present

## 2018-09-04 DIAGNOSIS — I10 Essential (primary) hypertension: Secondary | ICD-10-CM | POA: Diagnosis not present

## 2018-09-04 DIAGNOSIS — E785 Hyperlipidemia, unspecified: Secondary | ICD-10-CM | POA: Diagnosis not present

## 2018-09-04 DIAGNOSIS — E1169 Type 2 diabetes mellitus with other specified complication: Secondary | ICD-10-CM | POA: Diagnosis not present

## 2018-09-07 DIAGNOSIS — E1142 Type 2 diabetes mellitus with diabetic polyneuropathy: Secondary | ICD-10-CM | POA: Diagnosis not present

## 2018-09-07 DIAGNOSIS — E1159 Type 2 diabetes mellitus with other circulatory complications: Secondary | ICD-10-CM | POA: Diagnosis not present

## 2018-09-07 DIAGNOSIS — G7000 Myasthenia gravis without (acute) exacerbation: Secondary | ICD-10-CM | POA: Diagnosis not present

## 2018-09-07 DIAGNOSIS — R350 Frequency of micturition: Secondary | ICD-10-CM | POA: Diagnosis not present

## 2018-09-07 DIAGNOSIS — E1169 Type 2 diabetes mellitus with other specified complication: Secondary | ICD-10-CM | POA: Diagnosis not present

## 2018-09-07 DIAGNOSIS — E785 Hyperlipidemia, unspecified: Secondary | ICD-10-CM | POA: Diagnosis not present

## 2018-09-07 DIAGNOSIS — I10 Essential (primary) hypertension: Secondary | ICD-10-CM | POA: Diagnosis not present

## 2018-09-07 DIAGNOSIS — Z6834 Body mass index (BMI) 34.0-34.9, adult: Secondary | ICD-10-CM | POA: Diagnosis not present

## 2018-09-07 DIAGNOSIS — E669 Obesity, unspecified: Secondary | ICD-10-CM | POA: Diagnosis not present

## 2018-09-07 DIAGNOSIS — J849 Interstitial pulmonary disease, unspecified: Secondary | ICD-10-CM | POA: Diagnosis not present

## 2018-09-08 ENCOUNTER — Ambulatory Visit: Payer: PPO | Admitting: Internal Medicine

## 2018-09-10 DIAGNOSIS — S338XXA Sprain of other parts of lumbar spine and pelvis, initial encounter: Secondary | ICD-10-CM | POA: Diagnosis not present

## 2018-09-10 DIAGNOSIS — M9903 Segmental and somatic dysfunction of lumbar region: Secondary | ICD-10-CM | POA: Diagnosis not present

## 2018-09-10 DIAGNOSIS — M47816 Spondylosis without myelopathy or radiculopathy, lumbar region: Secondary | ICD-10-CM | POA: Diagnosis not present

## 2018-09-17 DIAGNOSIS — I8312 Varicose veins of left lower extremity with inflammation: Secondary | ICD-10-CM | POA: Diagnosis not present

## 2018-09-17 DIAGNOSIS — I872 Venous insufficiency (chronic) (peripheral): Secondary | ICD-10-CM | POA: Diagnosis not present

## 2018-09-17 DIAGNOSIS — S338XXA Sprain of other parts of lumbar spine and pelvis, initial encounter: Secondary | ICD-10-CM | POA: Diagnosis not present

## 2018-09-17 DIAGNOSIS — M9903 Segmental and somatic dysfunction of lumbar region: Secondary | ICD-10-CM | POA: Diagnosis not present

## 2018-09-17 DIAGNOSIS — I8311 Varicose veins of right lower extremity with inflammation: Secondary | ICD-10-CM | POA: Diagnosis not present

## 2018-09-17 DIAGNOSIS — M47816 Spondylosis without myelopathy or radiculopathy, lumbar region: Secondary | ICD-10-CM | POA: Diagnosis not present

## 2018-09-18 DIAGNOSIS — G4733 Obstructive sleep apnea (adult) (pediatric): Secondary | ICD-10-CM | POA: Diagnosis not present

## 2018-09-24 DIAGNOSIS — M47816 Spondylosis without myelopathy or radiculopathy, lumbar region: Secondary | ICD-10-CM | POA: Diagnosis not present

## 2018-09-24 DIAGNOSIS — N390 Urinary tract infection, site not specified: Secondary | ICD-10-CM | POA: Diagnosis not present

## 2018-09-24 DIAGNOSIS — S338XXA Sprain of other parts of lumbar spine and pelvis, initial encounter: Secondary | ICD-10-CM | POA: Diagnosis not present

## 2018-09-24 DIAGNOSIS — M9903 Segmental and somatic dysfunction of lumbar region: Secondary | ICD-10-CM | POA: Diagnosis not present

## 2018-09-28 DIAGNOSIS — S30861A Insect bite (nonvenomous) of abdominal wall, initial encounter: Secondary | ICD-10-CM | POA: Diagnosis not present

## 2018-09-28 DIAGNOSIS — W57XXXA Bitten or stung by nonvenomous insect and other nonvenomous arthropods, initial encounter: Secondary | ICD-10-CM | POA: Diagnosis not present

## 2018-09-28 DIAGNOSIS — L03319 Cellulitis of trunk, unspecified: Secondary | ICD-10-CM | POA: Diagnosis not present

## 2018-10-01 DIAGNOSIS — M9903 Segmental and somatic dysfunction of lumbar region: Secondary | ICD-10-CM | POA: Diagnosis not present

## 2018-10-01 DIAGNOSIS — M47816 Spondylosis without myelopathy or radiculopathy, lumbar region: Secondary | ICD-10-CM | POA: Diagnosis not present

## 2018-10-01 DIAGNOSIS — S338XXA Sprain of other parts of lumbar spine and pelvis, initial encounter: Secondary | ICD-10-CM | POA: Diagnosis not present

## 2018-10-08 DIAGNOSIS — M47816 Spondylosis without myelopathy or radiculopathy, lumbar region: Secondary | ICD-10-CM | POA: Diagnosis not present

## 2018-10-08 DIAGNOSIS — S338XXA Sprain of other parts of lumbar spine and pelvis, initial encounter: Secondary | ICD-10-CM | POA: Diagnosis not present

## 2018-10-08 DIAGNOSIS — M9903 Segmental and somatic dysfunction of lumbar region: Secondary | ICD-10-CM | POA: Diagnosis not present

## 2018-10-15 DIAGNOSIS — M9903 Segmental and somatic dysfunction of lumbar region: Secondary | ICD-10-CM | POA: Diagnosis not present

## 2018-10-15 DIAGNOSIS — M47816 Spondylosis without myelopathy or radiculopathy, lumbar region: Secondary | ICD-10-CM | POA: Diagnosis not present

## 2018-10-15 DIAGNOSIS — S338XXA Sprain of other parts of lumbar spine and pelvis, initial encounter: Secondary | ICD-10-CM | POA: Diagnosis not present

## 2018-10-16 DIAGNOSIS — W57XXXA Bitten or stung by nonvenomous insect and other nonvenomous arthropods, initial encounter: Secondary | ICD-10-CM | POA: Diagnosis not present

## 2018-10-16 DIAGNOSIS — R21 Rash and other nonspecific skin eruption: Secondary | ICD-10-CM | POA: Diagnosis not present

## 2018-10-19 DIAGNOSIS — G4733 Obstructive sleep apnea (adult) (pediatric): Secondary | ICD-10-CM | POA: Diagnosis not present

## 2018-10-22 DIAGNOSIS — M47816 Spondylosis without myelopathy or radiculopathy, lumbar region: Secondary | ICD-10-CM | POA: Diagnosis not present

## 2018-10-22 DIAGNOSIS — S338XXA Sprain of other parts of lumbar spine and pelvis, initial encounter: Secondary | ICD-10-CM | POA: Diagnosis not present

## 2018-10-22 DIAGNOSIS — M9903 Segmental and somatic dysfunction of lumbar region: Secondary | ICD-10-CM | POA: Diagnosis not present

## 2018-11-05 DIAGNOSIS — M9903 Segmental and somatic dysfunction of lumbar region: Secondary | ICD-10-CM | POA: Diagnosis not present

## 2018-11-05 DIAGNOSIS — M47816 Spondylosis without myelopathy or radiculopathy, lumbar region: Secondary | ICD-10-CM | POA: Diagnosis not present

## 2018-11-05 DIAGNOSIS — S338XXA Sprain of other parts of lumbar spine and pelvis, initial encounter: Secondary | ICD-10-CM | POA: Diagnosis not present

## 2018-11-10 DIAGNOSIS — E1165 Type 2 diabetes mellitus with hyperglycemia: Secondary | ICD-10-CM | POA: Diagnosis not present

## 2018-11-10 DIAGNOSIS — Z6835 Body mass index (BMI) 35.0-35.9, adult: Secondary | ICD-10-CM | POA: Diagnosis not present

## 2018-11-10 DIAGNOSIS — Z789 Other specified health status: Secondary | ICD-10-CM | POA: Diagnosis not present

## 2018-11-10 DIAGNOSIS — T148XXA Other injury of unspecified body region, initial encounter: Secondary | ICD-10-CM | POA: Diagnosis not present

## 2018-11-10 DIAGNOSIS — G7000 Myasthenia gravis without (acute) exacerbation: Secondary | ICD-10-CM | POA: Diagnosis not present

## 2018-11-10 DIAGNOSIS — I10 Essential (primary) hypertension: Secondary | ICD-10-CM | POA: Diagnosis not present

## 2018-11-10 DIAGNOSIS — Z299 Encounter for prophylactic measures, unspecified: Secondary | ICD-10-CM | POA: Diagnosis not present

## 2018-11-10 DIAGNOSIS — E7800 Pure hypercholesterolemia, unspecified: Secondary | ICD-10-CM | POA: Diagnosis not present

## 2018-11-18 DIAGNOSIS — G4733 Obstructive sleep apnea (adult) (pediatric): Secondary | ICD-10-CM | POA: Diagnosis not present

## 2018-11-19 DIAGNOSIS — M9903 Segmental and somatic dysfunction of lumbar region: Secondary | ICD-10-CM | POA: Diagnosis not present

## 2018-11-19 DIAGNOSIS — M47816 Spondylosis without myelopathy or radiculopathy, lumbar region: Secondary | ICD-10-CM | POA: Diagnosis not present

## 2018-11-19 DIAGNOSIS — S338XXA Sprain of other parts of lumbar spine and pelvis, initial encounter: Secondary | ICD-10-CM | POA: Diagnosis not present

## 2018-12-10 DIAGNOSIS — M47816 Spondylosis without myelopathy or radiculopathy, lumbar region: Secondary | ICD-10-CM | POA: Diagnosis not present

## 2018-12-10 DIAGNOSIS — S338XXA Sprain of other parts of lumbar spine and pelvis, initial encounter: Secondary | ICD-10-CM | POA: Diagnosis not present

## 2018-12-10 DIAGNOSIS — M9903 Segmental and somatic dysfunction of lumbar region: Secondary | ICD-10-CM | POA: Diagnosis not present

## 2018-12-17 DIAGNOSIS — I10 Essential (primary) hypertension: Secondary | ICD-10-CM | POA: Diagnosis not present

## 2018-12-19 DIAGNOSIS — G4733 Obstructive sleep apnea (adult) (pediatric): Secondary | ICD-10-CM | POA: Diagnosis not present

## 2018-12-24 DIAGNOSIS — Z299 Encounter for prophylactic measures, unspecified: Secondary | ICD-10-CM | POA: Diagnosis not present

## 2018-12-24 DIAGNOSIS — G7000 Myasthenia gravis without (acute) exacerbation: Secondary | ICD-10-CM | POA: Diagnosis not present

## 2018-12-24 DIAGNOSIS — Z6835 Body mass index (BMI) 35.0-35.9, adult: Secondary | ICD-10-CM | POA: Diagnosis not present

## 2018-12-24 DIAGNOSIS — I10 Essential (primary) hypertension: Secondary | ICD-10-CM | POA: Diagnosis not present

## 2018-12-24 DIAGNOSIS — E1165 Type 2 diabetes mellitus with hyperglycemia: Secondary | ICD-10-CM | POA: Diagnosis not present

## 2018-12-24 DIAGNOSIS — E7800 Pure hypercholesterolemia, unspecified: Secondary | ICD-10-CM | POA: Diagnosis not present

## 2018-12-31 DIAGNOSIS — E119 Type 2 diabetes mellitus without complications: Secondary | ICD-10-CM | POA: Diagnosis not present

## 2018-12-31 DIAGNOSIS — Z9989 Dependence on other enabling machines and devices: Secondary | ICD-10-CM | POA: Diagnosis not present

## 2018-12-31 DIAGNOSIS — G4733 Obstructive sleep apnea (adult) (pediatric): Secondary | ICD-10-CM | POA: Diagnosis not present

## 2018-12-31 DIAGNOSIS — I10 Essential (primary) hypertension: Secondary | ICD-10-CM | POA: Diagnosis not present

## 2019-01-19 DIAGNOSIS — N958 Other specified menopausal and perimenopausal disorders: Secondary | ICD-10-CM | POA: Diagnosis not present

## 2019-01-19 DIAGNOSIS — G4733 Obstructive sleep apnea (adult) (pediatric): Secondary | ICD-10-CM | POA: Diagnosis not present

## 2019-01-19 DIAGNOSIS — Z1231 Encounter for screening mammogram for malignant neoplasm of breast: Secondary | ICD-10-CM | POA: Diagnosis not present

## 2019-01-19 DIAGNOSIS — Z01419 Encounter for gynecological examination (general) (routine) without abnormal findings: Secondary | ICD-10-CM | POA: Diagnosis not present

## 2019-01-19 DIAGNOSIS — Z6838 Body mass index (BMI) 38.0-38.9, adult: Secondary | ICD-10-CM | POA: Diagnosis not present

## 2019-01-20 DIAGNOSIS — I10 Essential (primary) hypertension: Secondary | ICD-10-CM | POA: Diagnosis not present

## 2019-01-25 DIAGNOSIS — H5213 Myopia, bilateral: Secondary | ICD-10-CM | POA: Diagnosis not present

## 2019-01-25 DIAGNOSIS — E119 Type 2 diabetes mellitus without complications: Secondary | ICD-10-CM | POA: Diagnosis not present

## 2019-01-25 DIAGNOSIS — H2513 Age-related nuclear cataract, bilateral: Secondary | ICD-10-CM | POA: Diagnosis not present

## 2019-02-02 DIAGNOSIS — E1165 Type 2 diabetes mellitus with hyperglycemia: Secondary | ICD-10-CM | POA: Diagnosis not present

## 2019-02-02 DIAGNOSIS — I10 Essential (primary) hypertension: Secondary | ICD-10-CM | POA: Diagnosis not present

## 2019-02-02 DIAGNOSIS — G7000 Myasthenia gravis without (acute) exacerbation: Secondary | ICD-10-CM | POA: Diagnosis not present

## 2019-02-02 DIAGNOSIS — Z6836 Body mass index (BMI) 36.0-36.9, adult: Secondary | ICD-10-CM | POA: Diagnosis not present

## 2019-02-02 DIAGNOSIS — Z299 Encounter for prophylactic measures, unspecified: Secondary | ICD-10-CM | POA: Diagnosis not present

## 2019-02-18 DIAGNOSIS — G4733 Obstructive sleep apnea (adult) (pediatric): Secondary | ICD-10-CM | POA: Diagnosis not present

## 2019-02-25 DIAGNOSIS — I10 Essential (primary) hypertension: Secondary | ICD-10-CM | POA: Diagnosis not present

## 2019-03-02 DIAGNOSIS — I517 Cardiomegaly: Secondary | ICD-10-CM | POA: Diagnosis not present

## 2019-03-02 DIAGNOSIS — I6529 Occlusion and stenosis of unspecified carotid artery: Secondary | ICD-10-CM | POA: Diagnosis not present

## 2019-03-02 DIAGNOSIS — I340 Nonrheumatic mitral (valve) insufficiency: Secondary | ICD-10-CM | POA: Diagnosis not present

## 2019-03-02 DIAGNOSIS — I739 Peripheral vascular disease, unspecified: Secondary | ICD-10-CM | POA: Diagnosis not present

## 2019-03-04 DIAGNOSIS — I10 Essential (primary) hypertension: Secondary | ICD-10-CM | POA: Diagnosis not present

## 2019-03-04 DIAGNOSIS — G4730 Sleep apnea, unspecified: Secondary | ICD-10-CM | POA: Diagnosis not present

## 2019-03-04 DIAGNOSIS — G7000 Myasthenia gravis without (acute) exacerbation: Secondary | ICD-10-CM | POA: Diagnosis not present

## 2019-03-04 DIAGNOSIS — Z299 Encounter for prophylactic measures, unspecified: Secondary | ICD-10-CM | POA: Diagnosis not present

## 2019-03-04 DIAGNOSIS — E1165 Type 2 diabetes mellitus with hyperglycemia: Secondary | ICD-10-CM | POA: Diagnosis not present

## 2019-03-04 DIAGNOSIS — Z6836 Body mass index (BMI) 36.0-36.9, adult: Secondary | ICD-10-CM | POA: Diagnosis not present

## 2019-03-19 DIAGNOSIS — I340 Nonrheumatic mitral (valve) insufficiency: Secondary | ICD-10-CM | POA: Diagnosis not present

## 2019-03-19 DIAGNOSIS — I10 Essential (primary) hypertension: Secondary | ICD-10-CM | POA: Diagnosis not present

## 2019-03-19 DIAGNOSIS — I6529 Occlusion and stenosis of unspecified carotid artery: Secondary | ICD-10-CM | POA: Diagnosis not present

## 2019-03-19 DIAGNOSIS — I119 Hypertensive heart disease without heart failure: Secondary | ICD-10-CM | POA: Diagnosis not present

## 2019-03-21 DIAGNOSIS — G4733 Obstructive sleep apnea (adult) (pediatric): Secondary | ICD-10-CM | POA: Diagnosis not present

## 2019-03-26 DIAGNOSIS — I10 Essential (primary) hypertension: Secondary | ICD-10-CM | POA: Diagnosis not present

## 2019-03-30 DIAGNOSIS — Z6836 Body mass index (BMI) 36.0-36.9, adult: Secondary | ICD-10-CM | POA: Diagnosis not present

## 2019-03-30 DIAGNOSIS — J309 Allergic rhinitis, unspecified: Secondary | ICD-10-CM | POA: Diagnosis not present

## 2019-03-30 DIAGNOSIS — E1165 Type 2 diabetes mellitus with hyperglycemia: Secondary | ICD-10-CM | POA: Diagnosis not present

## 2019-03-30 DIAGNOSIS — G4730 Sleep apnea, unspecified: Secondary | ICD-10-CM | POA: Diagnosis not present

## 2019-03-30 DIAGNOSIS — I10 Essential (primary) hypertension: Secondary | ICD-10-CM | POA: Diagnosis not present

## 2019-03-30 DIAGNOSIS — Z299 Encounter for prophylactic measures, unspecified: Secondary | ICD-10-CM | POA: Diagnosis not present

## 2019-04-02 DIAGNOSIS — G4733 Obstructive sleep apnea (adult) (pediatric): Secondary | ICD-10-CM | POA: Diagnosis not present

## 2019-04-02 DIAGNOSIS — E119 Type 2 diabetes mellitus without complications: Secondary | ICD-10-CM | POA: Diagnosis not present

## 2019-04-02 DIAGNOSIS — I119 Hypertensive heart disease without heart failure: Secondary | ICD-10-CM | POA: Diagnosis not present

## 2019-04-02 DIAGNOSIS — Z9989 Dependence on other enabling machines and devices: Secondary | ICD-10-CM | POA: Diagnosis not present

## 2019-04-19 DIAGNOSIS — N390 Urinary tract infection, site not specified: Secondary | ICD-10-CM | POA: Diagnosis not present

## 2019-04-19 DIAGNOSIS — R300 Dysuria: Secondary | ICD-10-CM | POA: Diagnosis not present

## 2019-04-19 DIAGNOSIS — I10 Essential (primary) hypertension: Secondary | ICD-10-CM | POA: Diagnosis not present

## 2019-04-19 DIAGNOSIS — Z299 Encounter for prophylactic measures, unspecified: Secondary | ICD-10-CM | POA: Diagnosis not present

## 2019-04-19 DIAGNOSIS — Z6835 Body mass index (BMI) 35.0-35.9, adult: Secondary | ICD-10-CM | POA: Diagnosis not present

## 2019-04-19 DIAGNOSIS — B373 Candidiasis of vulva and vagina: Secondary | ICD-10-CM | POA: Diagnosis not present

## 2019-04-20 DIAGNOSIS — G4733 Obstructive sleep apnea (adult) (pediatric): Secondary | ICD-10-CM | POA: Diagnosis not present

## 2019-04-29 DIAGNOSIS — I10 Essential (primary) hypertension: Secondary | ICD-10-CM | POA: Diagnosis not present

## 2019-04-30 DIAGNOSIS — G4733 Obstructive sleep apnea (adult) (pediatric): Secondary | ICD-10-CM | POA: Diagnosis not present

## 2019-05-07 DIAGNOSIS — Z789 Other specified health status: Secondary | ICD-10-CM | POA: Diagnosis not present

## 2019-05-07 DIAGNOSIS — R21 Rash and other nonspecific skin eruption: Secondary | ICD-10-CM | POA: Diagnosis not present

## 2019-05-07 DIAGNOSIS — Z6835 Body mass index (BMI) 35.0-35.9, adult: Secondary | ICD-10-CM | POA: Diagnosis not present

## 2019-05-07 DIAGNOSIS — G7000 Myasthenia gravis without (acute) exacerbation: Secondary | ICD-10-CM | POA: Diagnosis not present

## 2019-05-07 DIAGNOSIS — R233 Spontaneous ecchymoses: Secondary | ICD-10-CM | POA: Diagnosis not present

## 2019-05-07 DIAGNOSIS — Z299 Encounter for prophylactic measures, unspecified: Secondary | ICD-10-CM | POA: Diagnosis not present

## 2019-05-07 DIAGNOSIS — E1165 Type 2 diabetes mellitus with hyperglycemia: Secondary | ICD-10-CM | POA: Diagnosis not present

## 2019-05-07 DIAGNOSIS — I10 Essential (primary) hypertension: Secondary | ICD-10-CM | POA: Diagnosis not present

## 2019-05-21 DIAGNOSIS — G4733 Obstructive sleep apnea (adult) (pediatric): Secondary | ICD-10-CM | POA: Diagnosis not present

## 2019-05-25 DIAGNOSIS — I10 Essential (primary) hypertension: Secondary | ICD-10-CM | POA: Diagnosis not present

## 2019-05-27 ENCOUNTER — Telehealth: Payer: Self-pay

## 2019-05-27 DIAGNOSIS — J029 Acute pharyngitis, unspecified: Secondary | ICD-10-CM | POA: Diagnosis not present

## 2019-05-27 DIAGNOSIS — H6982 Other specified disorders of Eustachian tube, left ear: Secondary | ICD-10-CM | POA: Diagnosis not present

## 2019-05-27 NOTE — Telephone Encounter (Signed)
To my knowledge both [place] and Moderna vaccines have not raised any specific concerns for patient with myasthenia in the bed benefit of getting the vaccine outweighs any fear of concern for weakness

## 2019-05-27 NOTE — Telephone Encounter (Signed)
I called pt that per Dr. Leonie Man its okay to get either vaccine being that she has Myasthenia gravis. PT verbalized understanding.

## 2019-05-27 NOTE — Telephone Encounter (Signed)
Patient called stating she would like to know if it is okay for her to get the COVID vaccine with her health condition (myasthenia) Please follow up.

## 2019-06-07 ENCOUNTER — Ambulatory Visit: Payer: PPO

## 2019-06-08 DIAGNOSIS — E1165 Type 2 diabetes mellitus with hyperglycemia: Secondary | ICD-10-CM | POA: Diagnosis not present

## 2019-06-08 DIAGNOSIS — I10 Essential (primary) hypertension: Secondary | ICD-10-CM | POA: Diagnosis not present

## 2019-06-08 DIAGNOSIS — Z6835 Body mass index (BMI) 35.0-35.9, adult: Secondary | ICD-10-CM | POA: Diagnosis not present

## 2019-06-08 DIAGNOSIS — G7000 Myasthenia gravis without (acute) exacerbation: Secondary | ICD-10-CM | POA: Diagnosis not present

## 2019-06-08 DIAGNOSIS — Z299 Encounter for prophylactic measures, unspecified: Secondary | ICD-10-CM | POA: Diagnosis not present

## 2019-06-08 DIAGNOSIS — D692 Other nonthrombocytopenic purpura: Secondary | ICD-10-CM | POA: Diagnosis not present

## 2019-06-15 DIAGNOSIS — E1140 Type 2 diabetes mellitus with diabetic neuropathy, unspecified: Secondary | ICD-10-CM | POA: Diagnosis not present

## 2019-06-15 DIAGNOSIS — E1159 Type 2 diabetes mellitus with other circulatory complications: Secondary | ICD-10-CM | POA: Diagnosis not present

## 2019-06-15 DIAGNOSIS — M79671 Pain in right foot: Secondary | ICD-10-CM | POA: Diagnosis not present

## 2019-06-21 DIAGNOSIS — G4733 Obstructive sleep apnea (adult) (pediatric): Secondary | ICD-10-CM | POA: Diagnosis not present

## 2019-06-25 DIAGNOSIS — I10 Essential (primary) hypertension: Secondary | ICD-10-CM | POA: Diagnosis not present

## 2019-06-28 DIAGNOSIS — Z6835 Body mass index (BMI) 35.0-35.9, adult: Secondary | ICD-10-CM | POA: Diagnosis not present

## 2019-06-28 DIAGNOSIS — Z299 Encounter for prophylactic measures, unspecified: Secondary | ICD-10-CM | POA: Diagnosis not present

## 2019-06-28 DIAGNOSIS — I10 Essential (primary) hypertension: Secondary | ICD-10-CM | POA: Diagnosis not present

## 2019-06-28 DIAGNOSIS — E1165 Type 2 diabetes mellitus with hyperglycemia: Secondary | ICD-10-CM | POA: Diagnosis not present

## 2019-06-28 DIAGNOSIS — G7000 Myasthenia gravis without (acute) exacerbation: Secondary | ICD-10-CM | POA: Diagnosis not present

## 2019-07-06 DIAGNOSIS — Z299 Encounter for prophylactic measures, unspecified: Secondary | ICD-10-CM | POA: Diagnosis not present

## 2019-07-06 DIAGNOSIS — G7000 Myasthenia gravis without (acute) exacerbation: Secondary | ICD-10-CM | POA: Diagnosis not present

## 2019-07-06 DIAGNOSIS — I779 Disorder of arteries and arterioles, unspecified: Secondary | ICD-10-CM | POA: Diagnosis not present

## 2019-07-06 DIAGNOSIS — E1165 Type 2 diabetes mellitus with hyperglycemia: Secondary | ICD-10-CM | POA: Diagnosis not present

## 2019-07-06 DIAGNOSIS — I10 Essential (primary) hypertension: Secondary | ICD-10-CM | POA: Diagnosis not present

## 2019-07-06 DIAGNOSIS — Z6835 Body mass index (BMI) 35.0-35.9, adult: Secondary | ICD-10-CM | POA: Diagnosis not present

## 2019-07-19 DIAGNOSIS — G4733 Obstructive sleep apnea (adult) (pediatric): Secondary | ICD-10-CM | POA: Diagnosis not present

## 2019-07-27 DIAGNOSIS — I10 Essential (primary) hypertension: Secondary | ICD-10-CM | POA: Diagnosis not present

## 2019-08-06 ENCOUNTER — Other Ambulatory Visit (HOSPITAL_COMMUNITY)
Admission: RE | Admit: 2019-08-06 | Discharge: 2019-08-06 | Disposition: A | Discharge status: Home / Self Care | Payer: PPO | Source: Ambulatory Visit | Attending: Internal Medicine | Admitting: Internal Medicine

## 2019-08-06 ENCOUNTER — Other Ambulatory Visit: Payer: Self-pay | Admitting: *Deleted

## 2019-08-06 ENCOUNTER — Other Ambulatory Visit: Payer: Self-pay

## 2019-08-06 DIAGNOSIS — Z01812 Encounter for preprocedural laboratory examination: Secondary | ICD-10-CM | POA: Insufficient documentation

## 2019-08-06 DIAGNOSIS — Z20822 Contact with and (suspected) exposure to covid-19: Secondary | ICD-10-CM | POA: Diagnosis not present

## 2019-08-06 DIAGNOSIS — J849 Interstitial pulmonary disease, unspecified: Secondary | ICD-10-CM

## 2019-08-07 LAB — SARS CORONAVIRUS 2 (TAT 6-24 HRS): SARS Coronavirus 2: NEGATIVE

## 2019-08-10 ENCOUNTER — Other Ambulatory Visit: Payer: Self-pay

## 2019-08-10 ENCOUNTER — Ambulatory Visit (INDEPENDENT_AMBULATORY_CARE_PROVIDER_SITE_OTHER): Payer: PPO | Admitting: Internal Medicine

## 2019-08-10 DIAGNOSIS — J849 Interstitial pulmonary disease, unspecified: Secondary | ICD-10-CM | POA: Diagnosis not present

## 2019-08-10 LAB — PULMONARY FUNCTION TEST
DL/VA % pred: 101 %
DL/VA: 4.23 ml/min/mmHg/L
DLCO cor % pred: 93 %
DLCO cor: 17.52 ml/min/mmHg
DLCO unc % pred: 91 %
DLCO unc: 17.01 ml/min/mmHg
FEF 25-75 Pre: 3.79 L/sec
FEF2575-%Pred-Pre: 216 %
FEV1-%Pred-Pre: 107 %
FEV1-Pre: 2.27 L
FEV1FVC-%Pred-Pre: 121 %
FEV6-%Pred-Pre: 92 %
FEV6-Pre: 2.47 L
FEV6FVC-%Pred-Pre: 104 %
FVC-%Pred-Pre: 88 %
FVC-Pre: 2.47 L
Pre FEV1/FVC ratio: 92 %
Pre FEV6/FVC Ratio: 100 %

## 2019-08-10 NOTE — Progress Notes (Signed)
Spiro and DLCO performed today. 

## 2019-08-11 DIAGNOSIS — G4733 Obstructive sleep apnea (adult) (pediatric): Secondary | ICD-10-CM | POA: Diagnosis not present

## 2019-08-19 DIAGNOSIS — G4733 Obstructive sleep apnea (adult) (pediatric): Secondary | ICD-10-CM | POA: Diagnosis not present

## 2019-08-23 DIAGNOSIS — W57XXXA Bitten or stung by nonvenomous insect and other nonvenomous arthropods, initial encounter: Secondary | ICD-10-CM | POA: Diagnosis not present

## 2019-08-23 DIAGNOSIS — S20469A Insect bite (nonvenomous) of unspecified back wall of thorax, initial encounter: Secondary | ICD-10-CM | POA: Diagnosis not present

## 2019-08-27 DIAGNOSIS — I10 Essential (primary) hypertension: Secondary | ICD-10-CM | POA: Diagnosis not present

## 2019-08-31 ENCOUNTER — Encounter: Payer: Self-pay | Admitting: Internal Medicine

## 2019-08-31 ENCOUNTER — Other Ambulatory Visit: Payer: Self-pay

## 2019-08-31 ENCOUNTER — Encounter: Payer: PPO | Admitting: Internal Medicine

## 2019-08-31 ENCOUNTER — Ambulatory Visit: Payer: PPO | Admitting: Internal Medicine

## 2019-08-31 VITALS — BP 120/62 | HR 74 | Temp 97.9°F | Ht 63.0 in | Wt 205.6 lb

## 2019-08-31 DIAGNOSIS — R768 Other specified abnormal immunological findings in serum: Secondary | ICD-10-CM

## 2019-08-31 DIAGNOSIS — J849 Interstitial pulmonary disease, unspecified: Secondary | ICD-10-CM | POA: Diagnosis not present

## 2019-08-31 NOTE — Patient Instructions (Addendum)
ICD-10-CM   1. ILD (interstitial lung disease) ([place]) J84.9   2. 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

## 2019-08-31 NOTE — Progress Notes (Signed)
IOV 06/04/2016  Chief Complaint  Patient presents with  . Advice Only    referred by Dr. Estanislado Pandy for ILD, abn ct.     74 year old female referred by Dr. Keturah Barre in the rheumatology clinic for evaluation of dyspnea in the setting of positive CCP and CT chest suggestive of ILD  History is taken from her and review of the outside chart of rheumatology and neurology January 2018  She is significantly obese although this is been stable for the last several years. Several years ago she developed knee issues and is wearing a brace/shoulder and her right lower extremity. Since then she's been less mobile and less active. Corresponding with this she's had insidious onset of dyspnea on exertion for climbing stairs and other activities that are class II-III nature. This dyspnea has been stable all along without any associated weight change. It is present with exertion relieved by rest. Is known orthopnea paroxysmal nocturnal dyspnea. She's had occasional associated dry cough but this is not consistent in the same with wheezing which is not consistent. There is no associated chest pain.  According to her history she's been seeing Dr. 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 According to her and review of the chart this is localized only to the ocular muscles she is on pyridostigmine. At this time a high-resolution CT chest was done 04/27/2016 that I personally visualized that shows coronary artery calcification which is unaware of and also mild subtle ILD changes. She subsequently saw Dr. Blake Divine January 2018 and autoimmune profile showed elevated CCP that was isolated but no clinical or systemic evidence of rheumatoid arthritis. The diagnoses on the joints is still osteoarthritis. Therefore she's been referred here. There is no associated chest pain     review of the chart shows she's not had an echocardiogram a  cardiac stress test btu she tells me she has one at [facility] with her cardioloigist 2 years ago was normal  Walking desaturation test 185 feet 3 laps on room air: Did not desaturate.  Results for ALEXISS, ITURRALDE (MRN [id number]) as of 06/04/2016 15:47  Ref. Range 01/15/2016 13:00  Acetylchol Block Ab Latest Ref Range: 0 - 25 % 31 (H)  Acety choline binding ab Latest Ref Range: 0.00 - 0.24 nmol/L 6.16 (H)  Acetylcholine Modulat Ab Latest Ref Range: 0 - 20 % 48 (H)   Results for MASSIEL, STIPP (MRN [id number]) as of 06/04/2016 15:47  Ref. Range 05/02/2016 10:00  CK Total Latest Ref Range: 7 - 177 U/L 99  Sed Rate Latest Ref Range: 0 - 30 mm/hr 4  Anit Nuclear Antibody(ANA) Latest Ref Range: NEGATIVE  NEG  Angiotensin-Converting Enzyme Latest Ref Range: 9 - 67 U/L 34  Cyclic Citrullin Peptide Ab Latest Units: Units 109 (H)  ds DNA Ab Latest Units: IU/mL <1  Myeloperoxidase Abs Latest Ref Range: <1.0 AI  <1.0  Serine Protease 3 Latest Ref Range: <1.0 AI  <1.0  RA Latex Turbid. Latest Ref Range: <14 IU/mL <14  ENA SM Ab Ser-aCnc Latest Ref Range: <1.0 NEG AI  <1.0 NEG  Ribonucleic Protein(ENA) Antibody, IgG Latest Ref Range: <1.0 NEG AI  <1.0 NEG  SSA (Ro) (ENA) Antibody, IgG Latest Ref Range: <1.0 NEG AI  <1.0 NEG  SSB (La) (ENA) Antibody, IgG Latest Ref Range: <1.0 NEG AI  <1.0 NEG  Scleroderma (Scl-70) (ENA) Antibody, IgG Latest Ref Range: <1.0 NEG  AI  <1.0 NEG      OV 12/03/2016  Chief Complaint  Patient presents with  . Follow-up    Pt states her breathing is unchanged since last OV. Pt denies significant cough, CP/tightness, f/c/s.    Follow-up shortness of breath that is multifactorial but associated with interstitial lung disease on CT scan of the chest December 2017 associated with positive CCP antibody and a diagnosis of osteoarthritis but not rheumatoid arthritis in the joints  She continues to be stable. There are no interim issues no chest pain. She only has mild shortness of  breath with exertion. Her ocular myasthenia is improved. Pulmonary function test shows mild reduction in isolated diffusion capacity 74%. Forced vital capacity and lung volumes are normal. High-resolution CT chest shows subtle ILD in the bases non-UIP pattern.  CT chest high resolution IMPRESSION: 1. 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 2. Aortic atherosclerosis, in addition to left main and 3 vessel coronary artery disease. Please note that although the presence of coronary artery calcium documents the presence of coronary artery disease, the severity of this disease and any potential stenosis cannot be assessed on this non-gated CT examination. Assessment for potential risk factor modification, dietary therapy or pharmacologic therapy may be warranted, if clinically indicated. 3. Mild hepatic steatosis. 4. Additional incidental findings, as above.  Aortic Atherosclerosis (ICD10-I70.0).   Electronically Signed   By: Vinnie Langton M.D.   On: 11/29/2016 10:15    OV 06/03/2017   Chief Complaint  Patient presents with  . Follow-up    PFT done today.  Pt states her breathing is about the same as last visit.  Pt recently had a sinus infection but is not currently on any meds. Has complaints of a cough with green mucus. Denies any CP.    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  In the setting of myasthenia gravis  Follow-up coronary artery calcification  Last visit August 2018.  Overall stable since then.  She reports significant dyspnea when climbing stairs but otherwise doing well.  Overall stable.  And walking desaturation test she got very tachycardic even though she did not desaturate.  She is known to have  coronary artery calcification and I referred her to cardiology but today she tells me that she follows for this at [facility] with Dr. Claudie Leach and her stress test is upcoming.  She has never been to pulmonary rehabilitation.  There is no associated chest pain or cough.  Follow pulmonary function test shows continued stability but perhaps reduction in DLCO.  On exam she has no crackles.     Walking desaturation test on 06/03/2017 185 feet x 3 laps on ROOM AIR:  did NOT desaturate. Rest pulse ox was 100%, final pulse ox was 99%. HR response 75/min at rest to 112/min at peak exertion. Patient JAZZIE TRAMPE  Did ot Desaturate < 88% . Daleen Snook did not  Desaturated </= 3% points. Daleen Snook yes did get tachyardic   OV 03/03/2018  Subjective:  Patient ID: BREEONA WAID, female , DOB: 1946-01-30 , age 16 y.o. , MRN: [id number] , ADDRESS: [address] [place] [address]   03/03/2018 -   Chief Complaint  Patient presents with  . Follow-up    pt c/o sob with  exertion.  denies cough, chest congestion.     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  In the setting of myasthenia gravis  Also coronary arter calcification  -     HPI YERALDY SPIKE 74 y.o. -returns for follow-up of ILD not otherwise specified.  After last visit she did see her cardiologist.  According to her there is no discussion about the coronary artery calcification but she recollects a normal stress test with the last few to several years.  She started attending pulmonary rehabilitation for the last few to several months.  She is been doing well with this and improved dyspnea.  Overall she is stable.  She had high-resolution CT chest in October 2019 that is unchanged from February 2018.  The pattern is described as either indeterminate or alternative diagnosis.   This seems to be some craniocaudal gradient with reticulations and traction bronchiectasis.  However there is some air trapping as well.  She is attending pulmonary rehabilitation and this is improved her dyspnea.  However the etiology of her ILD still unknown.  She did see Dr. D rheumatologist January 30, 2018 and has been advised that she has osteoarthritis based on my chart review.  She is on as needed follow-up.  Patient is wondering about the etiology of her ILD and is wondering about the safety of surgical lung biopsy.  Did a history retake and she did admit to having RAYNAUD phenomenon mild in the last few winters of her fingers.  T    Results for MULAN, ADAN (MRN [id number]) as of 06/03/2017 11:52  Ref. Range 12/03/2016 09:51 06/03/2017 10:48  FVC-Pre Latest Units: L 2.66 2.66  FVC-%Pred-Pre Latest Units: % 92 93   Results for RIELLY, BRUNN (MRN [id number]) as of 06/03/2017 11:52  Ref. Range 12/03/2016 09:51 06/03/2017 10:48  DLCO unc Latest Units: ml/min/mmHg 17.17 16.35  DLCO unc % pred Latest Units: % 74 71     HRCT 01/30/18 - [place]:  11/29/2016 high-resolution chest CT.  FINDINGS: Lungs/Pleura: No pneumothorax. No pleural effusion. No acute consolidative airspace disease, lung masses or significant pulmonary nodules. There is mild patchy subpleural reticulation and ground-glass attenuation in both lungs with associated mild traction bronchiectasis. There is a basilar gradient to these findings. No frank honeycombing. Mild patchy air trapping in both lungs on the expiration sequence. Interval stability of these findings.  Upper abdomen: Cholecystectomy.  Musculoskeletal: No aggressive appearing focal osseous lesions. Marked thoracic spondylosis. Partially visualized surgical hardware from ACDF in the lower cervical spine.  IMPRESSION: 1. Continued stability of basilar predominant fibrotic interstitial lung disease without frank honeycombing. Findings are  considered most compatible with an alternative diagnosis to UIP per ATS consensus criteria. 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 2. Mild patchy air trapping in both lungs, indicative of small airways disease. 3. Three-vessel coronary atherosclerosis.  Aortic Atherosclerosis (ICD10-I70.0).   Electronically Signed   By: Ilona Sorrel M.D.   On: 01/30/2018 08:07   IMPRESSION: Mild incomplete relaxation of the cricopharyngeus, but otherwise normal for age esophagram.   Electronically Signed   By: Genevie Ann M.D.   On: 02/10/2018 10:  OV 06/02/2018  Subjective:  Patient ID: Daleen Snook, female , DOB: 1945/07/09 , age 53 y.o. , MRN: [id number] , ADDRESS: [place] [address]   06/02/2018 -   Chief Complaint  Patient presents with  . Follow-up  PFT performed today.  Pt states she has been doing good since last visit and denies any complaints.    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  In the setting of myasthenia gravis    HPI SHERION KECKLER 74 y.o. -reports for follow-up.  In the interim she has had no new problems.  She had pulmonary function test.  The FVC is stable/slight decline.  Similarly DLCO even though the percentage is higher the wrong number shows possible decline.  This is because of the new equation called GLI.e. question that we are using.  Because of various possibilities of ILD #29 visit we did extensive autoimmune panel and this was all negative.  Currently the only positive result is the CCP antibody which was 109 in January 2018.  I have not over connective tissue disease history again but she denies.  In fact this time I asked her about RAYNAUD again and at this time she denies.  There are no new issues.  We discussed about the need to do a surgical  lung biopsy.  She is somewhat nervous because of obesity, sleep apnea and myasthenia gravis.  However, she is open to talking to the surgeon about it.  She is attending pulmonary rehabilitation and this helps her.       ROS - per HPI    OV 08/31/2019  Subjective:  Patient ID: Daleen Snook, female , DOB: March 29, 1946 , age 80 y.o. , MRN: [id number] , ADDRESS: [place] [address]   08/31/2019 -   Chief Complaint  Patient presents with  . 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  In the setting of myasthenia gravis    HPI HERMENA FLATEN 74 y.o. -6 presents for follow-up of her interstitial lung disease.  I last saw her in February 2020.  Referred to Dr. Roxan Hockey for surgical lung biopsy because of indeterminate UIP pattern.  She is scheduled the surgery within the pandemic broke out in March 2020 the surgery was canceled.  She has been isolating.  No new interim issues.  She has had a Covid vaccine.  She never developed Covid.  Her dyspnea is stable.  Stairs make it more difficult.  Dyspnea is relieved by rest rest of the symptoms listed below.  She is still open to having surgical lung biopsy.  Pulmonary function test done shows decrease in FVC but increase in DLCO again suggesting technical issues.  Her walking desaturation test is normal.  She is attending pulmonary [facility].     SYMPTOM SCALE - ILD 08/31/2019   O2 use ra  Shortness of Breath 0 -> 5 scale with 5 being worst (score 6 If unable to do)  At rest 0  Simple tasks - showers, clothes change, eating, shaving 0  Household (dishes, doing bed, laundry) 0  Shopping 1  Walking level at own pace 2  Walking up Stairs 4  Total (30-36) Dyspnea Score 7  How bad is your cough? 1  How bad  is your fatigue 4  How bad is nausea 0  How bad is vomiting?  0  How bad is diarrhea? 2  How bad is anxiety?  2  How bad is depression 2         Simple office walk 185 feet x  3 laps goal with forehead probe 03/03/2018  06/02/2018  08/31/2019   O2 used Room air Room air Room air  Number laps completed 3 3 3   Comments about pace normal normal normal  Resting Pulse Ox/HR 100% and 66/min 98% and 72/min 99% and 74/min  Final Pulse Ox/HR 99% and 108/min 96% and 57/mn 98% and 106/min  Desaturated </= 88% no no no  Desaturated <= 3% points no no no  Got Tachycardic >/= 90/min yes no yes  Symptoms at end of test x Mild dyspnea Dyspnea at 3rd lap  Miscellaneous comments x     ROS - per HPI  Results for TWILA, JOB (MRN [id number]) as of 08/31/2019 10:22  Ref. Range 12/03/2016 09:51 06/03/2017 11:05 06/02/2018 12:49 08/10/2019 10:53  FVC-Pre Latest Units: L 2.66 2.66 2.59 2.47  FVC-%Pred-Pre Latest Units: % 92 93 91 88   Results for EVANGELYNN, ZIVKOVICH (MRN [id number]) as of 08/31/2019 10:22  Ref. Range 12/03/2016 09:51 06/03/2017 11:05 06/02/2018 12:49 08/10/2019 10:53  DLCO unc Latest Units: ml/min/mmHg 17.17 16.35 15.58 - GLI equation 17.01  DLCO unc % pred Latest Units: % 74 71 83 91    has a past medical history of Arthritis, Bursitis, Diabetes mellitus without complication ([place]), Dyspnea, Fibromyalgia, GERD (gastroesophageal reflux disease), Hyperlipemia, Hypertension, Myasthenia gravis ([place]), Sinusitis, Sleep apnea, Sleep apnea, and Wears glasses.   reports that she has never smoked. She has never used smokeless tobacco.  Past Surgical History:  Procedure Laterality Date  . ABDOMINAL HYSTERECTOMY  1999  . BREAST SURGERY  2002   rt br bx  . CERVICAL FUSION  2007  . CHOLECYSTECTOMY  2005   lap choli  . COLONOSCOPY    . FINGER GANGLION CYST EXCISION    . INCISION AND DRAINAGE ABSCESS     Staph infection with multiple abdominal wounds s/p I&D in [place] ~ 2014  . SHOULDER ARTHROSCOPY WITH  OPEN ROTATOR CUFF REPAIR AND DISTAL CLAVICLE ACROMINECTOMY Right 09/09/2012   Procedure: Right Shoulder Arthroscopy with distal clavicle excision and mini-open rotator cuff repair.;  Surgeon: Alta Corning, MD;  Location: [facility];  Service: Orthopedics;  Laterality: Right;  . TONSILLECTOMY      Allergies  Allergen Reactions  . Celebrex [Celecoxib] Hives and Swelling    SWELLING REACTION UNSPECIFIED   . Vioxx [Rofecoxib] Hives  . Latex Rash    Immunization History  Administered Date(s) Administered  . Influenza Split 02/02/2016  . Influenza, High Dose Seasonal PF 03/08/2015, 01/27/2017, 02/20/2017, 01/27/2018  . Pneumococcal Conjugate-13 06/01/2015  . Pneumococcal Polysaccharide-23 01/28/2012  . Pneumococcal-Unspecified 06/04/2013  . Tdap 04/29/2009    Family History  Problem Relation Age of Onset  . Emphysema Father   . Intracerebral hemorrhage Sister      Current Outpatient Medications:  .  ACCU-CHEK AVIVA PLUS test strip, Apply 1 strip topically daily. for testing, Disp: , Rfl: 1 .  ACCU-CHEK SOFTCLIX LANCETS lancets, TEST DAILY E11.9, Disp: , Rfl: 1 .  APPLE CIDER VINEGAR PO, Take 1 tablet by mouth daily. , Disp: , Rfl:  .  Biotin w/ Vitamins C & E (HAIR/SKIN/NAILS PO), Take 1 tablet by mouth daily., Disp: , Rfl:  .  Calcium Carb-Cholecalciferol (CALCIUM+D3) 600-800 MG-UNIT TABS, Take 1 tablet by mouth daily., Disp: , Rfl:  .  cetirizine (ZYRTEC) 10 MG tablet, Take 10 mg by mouth  at bedtime. , Disp: , Rfl: 0 .  Co-Enzyme Q-10 100 MG CAPS, Take 100 mg by mouth daily. , Disp: , Rfl:  .  diclofenac sodium (VOLTAREN) 1 % GEL, Apply 1 application topically 3 (three) times daily as needed (arthritis). , Disp: , Rfl:  .  empagliflozin (JARDIANCE) 25 MG TABS tablet, Jardiance 25 mg tablet, Disp: , Rfl:  .  fluticasone (FLONASE) 50 MCG/ACT nasal spray, Place 2 sprays into both nostrils at bedtime. , Disp: , Rfl:  .  gabapentin (NEURONTIN) 100 MG capsule, Take 100  mg by mouth every morning. , Disp: , Rfl:  .  gabapentin (NEURONTIN) 300 MG capsule, Take 300 mg by mouth at bedtime. , Disp: , Rfl:  .  Ginger, Zingiber officinalis, (GINGER ROOT) 550 MG CAPS, Take 550 mg by mouth daily. , Disp: , Rfl:  .  glipiZIDE (GLUCOTROL XL) 10 MG 24 hr tablet, Take 10 mg by mouth 2 (two) times daily. , Disp: , Rfl:  .  levOCARNitine Fumarate 500 MG TABS, Take 500 mg by mouth daily. , Disp: , Rfl:  .  losartan-hydrochlorothiazide (HYZAAR) 100-25 MG tablet, Take 1 tablet by mouth every evening. , Disp: , Rfl:  .  metFORMIN (GLUCOPHAGE-XR) 500 MG 24 hr tablet, Take 1,000 mg by mouth 2 (two) times daily with a meal. , Disp: , Rfl:  .  metoprolol tartrate (LOPRESSOR) 25 MG tablet, Take 12.5 mg by mouth daily., Disp: , Rfl:  .  Misc Natural Products (TART CHERRY ADVANCED) CAPS, Take 1 capsule by mouth 2 (two) times daily. , Disp: , Rfl:  .  Misc. Devices MISC, Avivia Plus lancets and strips. Tests daily  DX E11.9, Disp: , Rfl:  .  multivitamin-lutein (OCUVITE-LUTEIN) CAPS capsule, Take 1 capsule by mouth daily., Disp: , Rfl:  .  Nutritional Supplements (EQ ESTROBLEND MENOPAUSE) TABS, Take 1 tablet by mouth every evening. , Disp: , Rfl:  .  omega-3 acid ethyl esters (LOVAZA) 1 g capsule, Take 1 g by mouth daily., Disp: , Rfl:  .  pregabalin (LYRICA) 50 MG capsule, Take 50 mg by mouth 2 (two) times daily. , Disp: , Rfl:  .  pyridostigmine (MESTINON) 60 MG tablet, Takes 1/2 tablet four times per day (Patient taking differently: Take 30 mg by mouth 4 (four) times daily. Takes 1/2 tablet four times per day), Disp: 120 tablet, Rfl: 6 .  Rosuvastatin Calcium 10 MG CPSP, Take 5 mg by mouth at bedtime. , Disp: , Rfl:  .  sitaGLIPtin (JANUVIA) 100 MG tablet, Take 100 mg by mouth daily. , Disp: , Rfl:  .  Turmeric Curcumin 500 MG CAPS, Take 500 mg by mouth every evening. , Disp: , Rfl:       Objective:   Vitals:   08/31/19 1011  BP: 120/62  Pulse: 74  Temp: 97.9 F (36.6 C)   TempSrc: Temporal  SpO2: 99%  Weight: 205 lb 9.6 oz (93.3 kg)  Height: 5\' 3"  (1.6 m)    Estimated body mass index is 36.42 kg/m as calculated from the following:   Height as of this encounter: 5\' 3"  (1.6 m).   Weight as of this encounter: 205 lb 9.6 oz (93.3 kg).  @WEIGHTCHANGE @  [employer]   08/31/19 1011  Weight: 205 lb 9.6 oz (93.3 kg)     Physical Exam obese lady with faint basal crackles but otherwise nonfocal exam.  No stigmata of connective tissue disease.        Assessment:  ICD-10-CM   1. Positive anti-CCP test  R76.8   2. Interstitial pulmonary disease ([place])  J84.9 CT Chest High Resolution       Plan:     Patient Instructions     ICD-10-CM   1. ILD (interstitial lung disease) ([place]) J84.9   2. Positive anti-CCP test R76.8     ILD is stable clinically Basis for ILD is not known -could be IPF, NSIP or rarely chronic hypersensitivity pneumonitis Surgrical lung bx - march 2020 was cancelled due to covid  Plan Do HRCT  supine and prone, inspiratory and exp[facility] [employer] [facility]   - do this next few days to few weeks  Followup 15 min telephone visit with Dr Chase Caller to discuss results   - based on CT findings - discuss bronchoscopy BAL/Biopsy method v surgical lung biopsy method v obersvation   (Level 04: Estb 30-39 min  visit type: on-site physical face to visit visit spent in total care time and counseling or/and coordination of care by this undersigned MD - Dr Brand Males. This includes one or more of the following on this same day 08/31/2019: pre-charting, chart review, note writing, documentation discussion of test results, diagnostic or treatment recommendations, prognosis, risks and benefits of management options, instructions, education, compliance or risk-factor reduction. It excludes time spent by the [place] or office staff in the care of the patient . Actual time is 30 min)   SIGNATURE    Dr. Brand Males, M.D.,  F.C.C.P,  Pulmonary and Critical Care Medicine Staff Physician, [place] Director - Interstitial Lung Disease  Program  Pulmonary [place] [facility] [place], [place], [address]  Pager: [phone], If no answer or between  15:00h - 7:00h: call [phone] Telephone: [phone]  10:45 AM 08/31/2019

## 2019-09-07 ENCOUNTER — Other Ambulatory Visit: Payer: Self-pay

## 2019-09-07 ENCOUNTER — Ambulatory Visit (HOSPITAL_COMMUNITY)
Admission: RE | Admit: 2019-09-07 | Discharge: 2019-09-07 | Disposition: A | Discharge status: Home / Self Care | Payer: PPO | Source: Ambulatory Visit | Attending: Internal Medicine | Admitting: Internal Medicine

## 2019-09-07 DIAGNOSIS — J849 Interstitial pulmonary disease, unspecified: Secondary | ICD-10-CM | POA: Insufficient documentation

## 2019-09-09 ENCOUNTER — Telehealth: Payer: Self-pay | Admitting: Internal Medicine

## 2019-09-09 NOTE — Telephone Encounter (Signed)
Pt calling requesting to know the results of the CT which she recently had done. MR, please advise on this for pt.

## 2019-09-09 NOTE — Telephone Encounter (Signed)
  No change in fibrosis 2017 -> 2019 -> May 2021  One ddx I had was a fibrotic disease called HP which Is why I was leaning toward bx - but there is no evidence of that (lack of air trapping)  Also because it has been stable now for 4 years -still the exact cause is not known - (can be variety called IPF, IPAF or NSIP)  Plan  -  I am ok to watch this for the moment instead of doing a biopsy  -given stability - return in augu 2021 - with spirometry and dlco - when she returns we can repeat CCP antibody test  If she has more questions - please book a telephone visit in June     SIGNATURE    Dr. Brand Males, M.D., F.C.C.P,  Pulmonary and Critical Care Medicine Staff Physician, [place] Director - Interstitial Lung Disease  Program  [facility] [place], [place], [address]  Pager: [phone], If no answer or between  15:00h - 7:00h: call [phone] Telephone: [phone]  6:09 PM 09/09/2019     CT Chest High Resolution  Result Date: 09/08/2019 CLINICAL DATA:  Follow-up interstitial lung disease. Chronic dyspnea with exertion and intermittent cough. EXAM: CT CHEST WITHOUT CONTRAST TECHNIQUE: Multidetector CT imaging of the chest was performed following the standard protocol without intravenous contrast. High resolution imaging of the lungs, as well as inspiratory and expiratory imaging, was performed. COMPARISON:  01/29/2018 high-resolution chest CT. FINDINGS: Cardiovascular: Normal heart size. No significant pericardial effusion/thickening. Three-vessel coronary atherosclerosis. Atherosclerotic nonaneurysmal thoracic aorta. Normal caliber pulmonary arteries. Mediastinum/Nodes: Stable heterogeneous nonenlarged thyroid gland without discrete thyroid nodules. Unremarkable esophagus. No pathologically enlarged axillary, mediastinal or hilar lymph nodes, noting limited sensitivity for the detection of hilar  adenopathy on this noncontrast study. Lungs/Pleura: No pneumothorax. No pleural effusion. No acute consolidative airspace disease, lung masses or significant pulmonary nodules. No significant air trapping on the expiration sequence. No evidence of tracheobronchomalacia. There is mild patchy subpleural reticulation and ground-glass opacity in both lungs with associated mild-to-moderate traction bronchiectasis and mild architectural distortion. There is a strong basilar predominance to these findings. No frank honeycombing. No convincing interval progression since 01/29/2018 high-resolution chest CT study or compared to the baseline 04/10/2016 high-resolution chest CT study. Upper abdomen: Cholecystectomy. Musculoskeletal: No aggressive appearing focal osseous lesions. Partially visualized surgical hardware from ACDF. Moderate thoracic spondylosis. IMPRESSION: Spectrum of findings compatible with basilar predominant fibrotic interstitial lung disease without frank honeycombing, without convincing interval progression since 01/29/2018 or since the baseline 04/10/2016 study. 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 Findings are indeterminate for UIP per consensus guidelines: Diagnosis of Idiopathic Pulmonary Fibrosis: An Official ATS/ERS/JRS/ALAT Clinical Practice Guideline. [place], Iss 5, [phone], Dec 28 2016. Three-vessel coronary atherosclerosis. Aortic Atherosclerosis (ICD10-I70.0). Electronically Signed   By: Ilona Sorrel M.D.   On: 09/08/2019 15:40

## 2019-09-10 NOTE — Telephone Encounter (Signed)
Attempted to call pt but unable to reach. Left message for her to return call. 

## 2019-09-10 NOTE — Telephone Encounter (Signed)
Called and spoke with patient about CT results. Recall has been placed for August she will call office to set up appointment.Nothing further needed at this time.

## 2019-09-14 DIAGNOSIS — S30861A Insect bite (nonvenomous) of abdominal wall, initial encounter: Secondary | ICD-10-CM | POA: Diagnosis not present

## 2019-09-14 DIAGNOSIS — W57XXXA Bitten or stung by nonvenomous insect and other nonvenomous arthropods, initial encounter: Secondary | ICD-10-CM | POA: Diagnosis not present

## 2019-09-18 DIAGNOSIS — G4733 Obstructive sleep apnea (adult) (pediatric): Secondary | ICD-10-CM | POA: Diagnosis not present

## 2019-09-26 DIAGNOSIS — I10 Essential (primary) hypertension: Secondary | ICD-10-CM | POA: Diagnosis not present

## 2019-10-08 ENCOUNTER — Telehealth: Payer: Self-pay | Admitting: Neurology

## 2019-10-08 MED ORDER — PYRIDOSTIGMINE BROMIDE 60 MG PO TABS: 60.0000 mg | ORAL_TABLET | Freq: Three times a day (TID) | ORAL | 3 refills | Status: DC

## 2019-10-08 NOTE — Telephone Encounter (Signed)
I called the patient.  The patient is now having symptoms over the last day or so with droopiness of the left eyelid, she has a history of myasthenia gravis.  She has done quite well for 3 years, she takes only Mestinon 60 mg, 1/2 tablet 4 times daily.  She has diabetes.  She does not wish to go on prednisone, we will go up on the Mestinon taking 60 mg 3 times daily.  The patient will need an appointment to see Dr. Leonie Man, she has not been seen since 2019.

## 2019-10-11 NOTE — Telephone Encounter (Signed)
I called pt and schedule her with Dr.Sethi per Dr. Eugenie Birks phone note. Pt schedule for June 28 at 200pm, check in at 0130, mask required and one visitor. PT left eyelid is drooping again. PT has history of Myasthenia gravis.

## 2019-10-13 DIAGNOSIS — E1165 Type 2 diabetes mellitus with hyperglycemia: Secondary | ICD-10-CM | POA: Diagnosis not present

## 2019-10-13 DIAGNOSIS — Z0000 Encounter for general adult medical examination without abnormal findings: Secondary | ICD-10-CM | POA: Diagnosis not present

## 2019-10-13 DIAGNOSIS — Z1339 Encounter for screening examination for other mental health and behavioral disorders: Secondary | ICD-10-CM | POA: Diagnosis not present

## 2019-10-13 DIAGNOSIS — Z1331 Encounter for screening for depression: Secondary | ICD-10-CM | POA: Diagnosis not present

## 2019-10-13 DIAGNOSIS — I10 Essential (primary) hypertension: Secondary | ICD-10-CM | POA: Diagnosis not present

## 2019-10-13 DIAGNOSIS — Z6831 Body mass index (BMI) 31.0-31.9, adult: Secondary | ICD-10-CM | POA: Diagnosis not present

## 2019-10-13 DIAGNOSIS — E7800 Pure hypercholesterolemia, unspecified: Secondary | ICD-10-CM | POA: Diagnosis not present

## 2019-10-13 DIAGNOSIS — Z299 Encounter for prophylactic measures, unspecified: Secondary | ICD-10-CM | POA: Diagnosis not present

## 2019-10-13 DIAGNOSIS — Z79899 Other long term (current) drug therapy: Secondary | ICD-10-CM | POA: Diagnosis not present

## 2019-10-13 DIAGNOSIS — Z1211 Encounter for screening for malignant neoplasm of colon: Secondary | ICD-10-CM | POA: Diagnosis not present

## 2019-10-13 DIAGNOSIS — Z7189 Other specified counseling: Secondary | ICD-10-CM | POA: Diagnosis not present

## 2019-10-25 ENCOUNTER — Ambulatory Visit: Payer: PPO | Admitting: Neurology

## 2019-10-25 ENCOUNTER — Other Ambulatory Visit: Payer: Self-pay

## 2019-10-25 ENCOUNTER — Encounter: Payer: Self-pay | Admitting: Neurology

## 2019-10-25 VITALS — BP 113/68 | HR 65 | Ht 64.0 in | Wt 205.0 lb

## 2019-10-25 DIAGNOSIS — H02402 Unspecified ptosis of left eyelid: Secondary | ICD-10-CM | POA: Diagnosis not present

## 2019-10-25 DIAGNOSIS — G7000 Myasthenia gravis without (acute) exacerbation: Secondary | ICD-10-CM | POA: Diagnosis not present

## 2019-10-25 MED ORDER — PYRIDOSTIGMINE BROMIDE 60 MG PO TABS: 60.0000 mg | ORAL_TABLET | Freq: Four times a day (QID) | ORAL | 3 refills | Status: DC

## 2019-10-25 NOTE — Progress Notes (Signed)
[facility]  PATIENT: [name] DOB: 02/12/1946   REASON FOR VISIT: Follow-up for ocular myasthenia HISTORY FROM: Patient    HISTORY OF PRESENT ILLNESS: Initial Consult  03/07/16 PS;  [name] is a 65 year pleasant Caucasian lady whose had droopy eyelids first started in December 2016 when she stated states that "I was drooping and she will barely open it. She was seen by primary physician who thought she had partial Bell's palsy. This did not get better and subsequently in June she saw ophthalmologist who noticed that she had some infection on the lower eyelid treated with antibiotics. Since then she started that she has intermittent opening of both eyelids left more than right. There are times when arises is completely shut and on other times she can see well. In fact on inquiry she states that a couple of occasion while driving she was seeing double the dividing lines of the road for crossing over. She is also noticed that she gets fatigued and tired very easily. Initially when this first began this was thought to be related to viral infection but this does not seem to be getting better. She denies any difficulty moving her eyes, any dysphagia, weakness in her arms or legs. She has no trouble getting out, chair or climbing up and down steps. She has a good appetite and she had not lost any weight. Review of electronic medical record shows that she had elevated acetylcholine receptor antibodies on 01/15/16 with acetylcholine binding antibody of 6.16, blocking antibody 31 and modulating antibody 48 all of which are abnormal. Patient has not been tried on Mestinon on other medications for myasthenia. She denies any history of headaches, strokes, TIAs or other neurologic problems. Update 1/16/2018PS : She returns for follow-up after last visit 2 months ago. She states she has noticed benefit on Mestinon and eyes do not droop as much and she is not had any episodes of diplopia.  She doesn't feel tired as much as well. However she was unable to tolerate 60 mg dose of Mestinon due to stomach cramps, nausea and is taking only 30 mg mostly 3 times a day and takes an occasional fourth dose if needed. She did undergo nerve conduction study with rapid repetitive stimulation on 04/10/16 which was negative for decremental response. The patient is reluctant to consider prednisone given her diabetes and risk of blood sugars being elevated on it UPDATE 04/19/2018CM [name], 74 year old female returns for follow-up with history of ocular myasthenia. She is currently on Mestinon sometimes taking a full pill but mostly half pills due to nausea. She is a diabetic so she does not want to take prednisone. She has had no double vision blurred vision or any drooping of her left eye. Vision today 20/50 right 20/40 left.She denies any difficulty moving her eyes, any dysphagia, weakness in her arms or legs. She has no trouble getting out, chair or climbing up and down steps.  She continues to be active. She returns for reevaluation. She is requesting something for nausea UPDATE 10/17/2018CM  [name], 74 year old female returns for follow-up with a history of ocular myasthenia with symptoms beginning in June 2016. She denies any episodes of diplopia, no ptosis seen today. Vision today 2070 bilaterally. She denies any weakness in her arms or legs or any problems with breathing she continues to be quite active. She continues to drive without difficulty she had 1 recent fall no injury.  She had a recent visit to her ophthalmologist.  She is currently on Mestinon sometimes takes a full pill and sometimes a half pill. She has nausea but  Zofran did not help, she returns for reevaluation. Recently diagnosed with arthritis. Update 08/19/2017 :she returns for follow-up after last visit 6 months ago. She states her cholesterol myasthenia symptoms are well controlled. She denies any drooping of the eyelids, diplopia,  muscle fatigue or weakness. She states she is tired all the time but this may be related to sleep apnea and lack of exercise. She is currently on Mestinon 30 mg 4 times daily and is tolerating it quite well. She was unable to tolerate 60 mg due to diarrhea and upset stomach. She has no new complaints today. She does complain of some intermittent trouble swallowing when she thinks she swallows too quickly at times gets stuck in the middle of the chest. She denies any nasal regurg, nasal twang to her speech heart food being stuck in the back of her mouth.She states her blood pressure and sugar control have been quite good. 10/25/2019 : She returns for follow-up after last visit 2 years ago.  She states she was doing fine with her ptosis and myasthenia until 2 weeks ago when she is noticed increased droopiness of the left eye particularly towards the afternoon.  In the mornings her eyes seem much more open.  She called Dr. Jannifer Franklin who recommended increasing her Mestinon which she was taking 30 mg 4 times daily to 60 mg 3 times daily.  She has not noticed any specific benefit.  She does have mild diarrhea and nausea but is tolerating it otherwise well.  Patient in the past has not been able to tolerate higher doses of Mestinon.  Dr. Jimmye Norman did discuss adding prednisone but the patient is reluctant to try this because of her diabetes and worsening sugars.  Patient denies any weakness in her extremities, significant fatigue or tiredness.  She denies any recent respiratory illness in the form of infection.  She did lose her balance 1 she tripped but did not hurt herself.  She does have some intermittent diplopia particularly with sustained upgaze.   REVIEW OF SYSTEMS: Full 14 system review of systems performed and notable only for those listed, all others are neg:  Intermittent diplopia, drooping of the left eyelid only. All systems negative  ALLERGIES: Allergies  Allergen Reactions   Celebrex [Celecoxib] Hives and  Swelling    SWELLING REACTION UNSPECIFIED    Vioxx [Rofecoxib] Hives   Latex Rash    HOME MEDICATIONS: Outpatient Medications Prior to Visit  Medication Sig Dispense Refill   ACCU-CHEK AVIVA PLUS test strip Apply 1 strip topically daily. for testing  1   ACCU-CHEK SOFTCLIX LANCETS lancets TEST DAILY E11.9  1   APPLE CIDER VINEGAR PO Take 1 tablet by mouth daily.      Biotin w/ Vitamins C & E (HAIR/SKIN/NAILS PO) Take 1 tablet by mouth daily.     Calcium Carb-Cholecalciferol (CALCIUM+D3) 600-800 MG-UNIT TABS Take 1 tablet by mouth daily.     cetirizine (ZYRTEC) 10 MG tablet Take 10 mg by mouth at bedtime.   0   Co-Enzyme Q-10 100 MG CAPS Take 100 mg by mouth daily.      diclofenac sodium (VOLTAREN) 1 % GEL Apply 1 application topically 3 (three) times daily as needed (arthritis).      empagliflozin (JARDIANCE) 25 MG TABS tablet Jardiance 25 mg tablet     fluticasone (FLONASE) 50 MCG/ACT nasal spray Place 2 sprays into both nostrils  at bedtime.      gabapentin (NEURONTIN) 300 MG capsule Take 300 mg by mouth at bedtime.      Ginger, Zingiber officinalis, (GINGER ROOT) 550 MG CAPS Take 550 mg by mouth daily.      glipiZIDE (GLUCOTROL XL) 10 MG 24 hr tablet Take 10 mg by mouth 2 (two) times daily.      levOCARNitine Fumarate 500 MG TABS Take 500 mg by mouth daily.      losartan-hydrochlorothiazide (HYZAAR) 100-25 MG tablet Take 1 tablet by mouth every evening.      metFORMIN (GLUCOPHAGE-XR) 500 MG 24 hr tablet Take 1,000 mg by mouth 2 (two) times daily with a meal.      metoprolol tartrate (LOPRESSOR) 25 MG tablet Take 12.5 mg by mouth daily.     Misc Natural Products (TART CHERRY ADVANCED) CAPS Take 1 capsule by mouth 2 (two) times daily.      Misc. Devices MISC Avivia Plus lancets and strips. Tests daily  DX E11.9     multivitamin-lutein (OCUVITE-LUTEIN) CAPS capsule Take 1 capsule by mouth daily.     Nutritional Supplements (EQ ESTROBLEND MENOPAUSE) TABS Take 1  tablet by mouth every evening.      omega-3 acid ethyl esters (LOVAZA) 1 g capsule Take 1 g by mouth daily.     pregabalin (LYRICA) 50 MG capsule Take 50 mg by mouth 2 (two) times daily.      Rosuvastatin Calcium 10 MG CPSP Take 5 mg by mouth at bedtime.      sitaGLIPtin (JANUVIA) 100 MG tablet Take 100 mg by mouth daily.      Turmeric Curcumin 500 MG CAPS Take 500 mg by mouth every evening.      VITAMIN D PO Take by mouth.     pyridostigmine (MESTINON) 60 MG tablet Take 1 tablet (60 mg total) by mouth 3 (three) times daily. 90 tablet 3   gabapentin (NEURONTIN) 100 MG capsule Take 100 mg by mouth every morning.  (Patient not taking: Reported on 10/25/2019)     No facility-administered medications prior to visit.    PAST MEDICAL HISTORY: Past Medical History:  Diagnosis Date   Arthritis    Bursitis    Diabetes mellitus without complication (HCC)    Dyspnea    Fibromyalgia    GERD (gastroesophageal reflux disease)    Hyperlipemia    Hypertension    Myasthenia gravis ([place])    ocular   Sinusitis    Sleep apnea    uses a cpap   Sleep apnea    Wears glasses     PAST SURGICAL HISTORY: Past Surgical History:  Procedure Laterality Date   ABDOMINAL HYSTERECTOMY  1999   BREAST SURGERY  2002   rt br bx   CERVICAL FUSION  2007   CHOLECYSTECTOMY  2005   lap choli   COLONOSCOPY     FINGER GANGLION CYST EXCISION     INCISION AND DRAINAGE ABSCESS     Staph infection with multiple abdominal wounds s/p I&D in [place] ~ 2014   SHOULDER ARTHROSCOPY WITH OPEN ROTATOR CUFF REPAIR AND DISTAL CLAVICLE ACROMINECTOMY Right 09/09/2012   Procedure: Right Shoulder Arthroscopy with distal clavicle excision and mini-open rotator cuff repair.;  Surgeon: Alta Corning, MD;  Location: [facility];  Service: Orthopedics;  Laterality: Right;   TONSILLECTOMY      FAMILY HISTORY: Family History  Problem Relation Age of Onset   Emphysema Father    Intracerebral  hemorrhage Sister  Myasthenia gravis Neg Hx        none that she knows of     SOCIAL HISTORY: Social History   Socioeconomic History   Marital status: Married    Spouse name: Not on file   Number of children: Not on file   Years of education: Not on file   Highest education level: Not on file  Occupational History   Occupation: Retired  Tobacco Use   Smoking status: Never Smoker   Smokeless tobacco: Never Used  Scientific laboratory technician Use: Never used  Substance and Sexual Activity   Alcohol use: No   Drug use: No   Sexual activity: Not on file  Other Topics Concern   Not on file  Social History Narrative   Lives at home with husband   Right handed   Caffeine: maybe 1 cup/day   Social Determinants of Radio broadcast assistant Strain:    Difficulty of Paying Living Expenses:   Food Insecurity:    Worried About Charity fundraiser in the Last Year:    Arboriculturist in the Last Year:   Transportation Needs:    Film/video editor (Medical):    Lack of Transportation (Non-Medical):   Physical Activity:    Days of Exercise per Week:    Minutes of Exercise per Session:   Stress:    Feeling of Stress :   Social Connections:    Frequency of Communication with Friends and Family:    Frequency of Social Gatherings with Friends and Family:    Attends Religious Services:    Active Member of Clubs or Organizations:    Attends Archivist Meetings:    Marital Status:   Intimate Partner Violence:    Fear of Current or Ex-Partner:    Emotionally Abused:    Physically Abused:    Sexually Abused:      PHYSICAL EXAM  Vitals:   10/25/19 1413  BP: 113/68  Pulse: 65  Weight: 205 lb (93 kg)  Height: 5\' 4"  (1.626 m)   Body mass index is 35.19 kg/m.  Generalized:  There is an elderly Caucasian lady,Obese female in no acute distress  Head: normocephalic and atraumatic,.   Neck: Supple, no carotid bruits  Cardiac: Regular  rate rhythm, no murmur  Musculoskeletal: No deformity   Neurological examination   Mentation: Alert oriented to time, place, history taking. Attention span and concentration appropriate. Recent and remote memory intact.  Follows all commands speech and language fluent.   Cranial nerve II-XII: Pupils were equal round reactive to light extraocular movements were full, no nystagmus, left eye drooping at rest which fluctuates during the visit and at times is nearly complete.  With effort to open the eyes she can open it with sustained upgaze   bilateral left greater than right ptosis, mild diplopia on sustained upgaze  . Facial sensation and strength were normal. hearing was intact to finger rubbing bilaterally. Uvula tongue midline. head turning and shoulder shrug were normal and symmetric.Tongue protrusion into cheek strength was normal. Motor: normal bulk and tone, full strength in the BUE, BLE, except decreased weakness distally right  lower extremity,  fine finger movements normal,  Sensory: normal and symmetric to light touch, pinprick, and  Vibration, in the upper and lower extremities  Coordination: finger-nose-finger, heel-to-shin bilaterally, no dysmetria Reflexes: 1+ upper lower and symmetric, plantar responses were flexor bilaterally. Gait and Station: Rising up from seated position without assistance, normal stance,  moderate stride, good arm swing, smooth turning, able to perform tiptoe, and heel walking without difficulty. Tandem gait is unsteady. Ambulates with single-point cane  DIAGNOSTIC DATA (LABS, IMAGING, TESTING) -  ASSESSMENT AND PLAN 86 year Caucasian lady with gradual onset drooping of eyelids in December 2016 l due to ocular myasthenia. She has positive acetylcholine receptor antibodies. She has worsening of left eye drooping and some intermittent diplopia in recent months and has not responded well to recent increase in Mestinon.  She is refusing to take prednisone at the  present time  I had a long discussion the patient with regards to her symptoms of eyelid drooping and intermittent diplopia which likely represent worsening of her ocular myasthenia.  I recommend she increase the dose of Mestinon to 60 mg 4 times daily if tolerated without GI side effects and if this is not adequate may need to increase it to 5 times daily up to maximum 90 mg if tolerated.  She is reluctant to go on steroids given her diabetes but may consider it if she is unable to tolerate Mestinon in the future.  She will return for follow-up in 2 months or call earlier if necessary.I spent 30 min  in total face to face time with the patient more than 50% of which was spent counseling and coordination of care, reviewing test results reviewing medications and discussing and reviewing the diagnosis of ocular myasthenia gravis and further treatment options. Antony Contras, MD [facility] [address], [place], [place] [address] [phone]

## 2019-10-25 NOTE — Patient Instructions (Signed)
I had a long discussion the patient with regards to her symptoms of eyelid drooping and intermittent diplopia which likely represent worsening of her ocular myasthenia.  I recommend she increase the dose of Mestinon to 60 mg 4 times daily if tolerated without GI side effects and if this is not adequate may need to increase it to 5 times daily up to maximum 90 mg if tolerated.  She is reluctant to go on steroids given her diabetes but may consider it if she is unable to tolerate Mestinon in the future.  She will return for follow-up in 2 months or call earlier if necessary.  Myasthenia Gravis Myasthenia gravis (MG) is a long-term (chronic) condition that causes weakness in the muscles you can control (voluntary muscles). MG can affect any voluntary muscle. The muscles most often affected are the ones that control:  Eye movement.  Facial movements.  Swallowing. MG is a disease in which the body's disease-fighting system (immune system) attacks its own healthy tissues (autoimmune disease). When you have MG, your immune system makes proteins (antibodies) that block the chemical (acetylcholine) that your body needs to send nerve signals to your muscles. This causes muscle weakness. What are the causes? The exact cause of MG is not known. What increases the risk? The following factors may make you more likely to develop this condition:  Having an enlarged thymus gland. The thymus gland is located under the breastbone. It makes certain cells for the immune system.  Having a family history of MG. What are the signs or symptoms? Symptoms of MG may include:  Drooping eyelids.  Double vision.  Muscle weakness that gets worse with activity and gets better after rest.  Difficulty walking.  Trouble chewing and swallowing.  Trouble making facial expressions.  Slurred speech.  Weakness of the arms, hands, and legs. Sudden, severe difficulty breathing (myasthenic crisis) may develop after  having:  An infection.  A fever.  A bad reaction to a medicine. Myasthenic crisis requires emergency breathing support. Sometimes symptoms of MG go away for a while (remission) and then come back later. How is this diagnosed? This condition may be diagnosed based on:  Your symptoms and medical history.  A physical exam.  Blood tests.  Tests of your muscle strength and function.  Imaging tests, such as a CT scan or an MRI. How is this treated? The goal of treatment is to improve muscle strength. Treatment may include:  Taking medicine.  Making lifestyle changes that focus on saving your energy.  Doing physical therapy to gain strength.  Having surgery to remove the thymus gland (thymectomy). This may result in a long remission for some people.  Having a procedure to remove the acetylcholine antibodies (plasmapheresis).  Getting emergency breathing support, if you experience myasthenic crisis. If you experience remission, you may be able to stop treatment and then resume treatment when your symptoms return. Follow these instructions at home:   Take over-the-counter and prescription medicines only as told by your health care provider.  Get plenty of rest and sleep. Take frequent breaks to rest your eyes, especially when in bright light or working on a computer.  Maintain a healthy diet and a healthy weight. Work with your health care provider or a diet and nutrition specialist (dietitian) if you need help.  Do exercises as told by your health care provider or physical therapist.  Do not use any products that contain nicotine or tobacco, such as cigarettes and e-cigarettes. If you need help quitting,  ask your health care provider.  Prevent infections by: ? Washing your hands often with soap and water. If soap and water are not available, use hand sanitizer. ? Avoiding contact with other people who are sick. ? Avoiding touching your eyes, nose, and mouth. ? Cleaning  surfaces in your home that are touched often using a disinfectant.  Keep all follow-up visits as told by your health care provider. This is important. Contact a health care provider if:  Your symptoms change or get worse, especially after having a fever or infection. Get help right away if:  You have trouble breathing. Summary  Myasthenia gravis (MG) is a long-term (chronic) condition that causes weakness in the muscles you can control (voluntary muscles).  A symptom of MG is muscle weakness that gets worse with activity and gets better after rest.  Sudden, severe difficulty breathing (myasthenic crisis) may develop after having an infection, a fever, or a bad reaction to a medicine.  The goal of treatment is to improve muscle strength. Treatment may include medicines, lifestyle changes, physical therapy, surgery, plasmapheresis, or emergency breathing support. This information is not intended to replace advice given to you by your health care provider. Make sure you discuss any questions you have with your health care provider. Document Revised: 04/28/2017 Document Reviewed: 04/28/2017 Elsevier Patient Education  2020 [employer].

## 2019-10-26 DIAGNOSIS — I10 Essential (primary) hypertension: Secondary | ICD-10-CM | POA: Diagnosis not present

## 2019-11-10 DIAGNOSIS — M545 Low back pain: Secondary | ICD-10-CM | POA: Diagnosis not present

## 2019-11-10 DIAGNOSIS — M1612 Unilateral primary osteoarthritis, left hip: Secondary | ICD-10-CM | POA: Diagnosis not present

## 2019-11-26 DIAGNOSIS — I10 Essential (primary) hypertension: Secondary | ICD-10-CM | POA: Diagnosis not present

## 2019-11-26 DIAGNOSIS — E1165 Type 2 diabetes mellitus with hyperglycemia: Secondary | ICD-10-CM | POA: Diagnosis not present

## 2019-11-30 DIAGNOSIS — M25552 Pain in left hip: Secondary | ICD-10-CM | POA: Diagnosis not present

## 2019-11-30 DIAGNOSIS — M7062 Trochanteric bursitis, left hip: Secondary | ICD-10-CM | POA: Diagnosis not present

## 2019-12-01 ENCOUNTER — Telehealth: Payer: Self-pay | Admitting: Neurology

## 2019-12-01 MED ORDER — PYRIDOSTIGMINE BROMIDE 60 MG PO TABS: 60.0000 mg | ORAL_TABLET | Freq: Four times a day (QID) | ORAL | 3 refills | Status: DC

## 2019-12-01 NOTE — Addendum Note (Signed)
Addended by: Wyvonnia Lora on: 12/01/2019 09:37 AM   Modules accepted: Orders

## 2019-12-01 NOTE — Telephone Encounter (Signed)
Called pt back. Dr. Leonie Man called in rx for mestinon on 10/25/19 for 1 po four times daily but only for qty 90. Advised I will send in new rx for qty #120 instead. She verbalized understanding and appreciation. She will call back if she has any issues picking up rx.

## 2019-12-01 NOTE — Telephone Encounter (Signed)
Pt is calling re: her pyridostigmine (MESTINON) 60 MG tablet to [name]  Pt states Dr Leonie Man wants her taking 4 a day but her current prescription was not written for that and as a result she will run out before her upcoming appointment with Dr Leonie Man.  Pt is asking if 85 pills can be called to last her until her appointment, pt states she doesn't even have 4 to take for today, please call

## 2019-12-19 NOTE — Patient Instructions (Signed)
pft  

## 2019-12-21 ENCOUNTER — Encounter: Payer: Self-pay | Admitting: Adult Health

## 2019-12-21 ENCOUNTER — Ambulatory Visit: Payer: PPO | Admitting: Neurology

## 2019-12-21 ENCOUNTER — Ambulatory Visit: Payer: PPO | Admitting: Adult Health

## 2019-12-21 VITALS — BP 126/84 | HR 80 | Ht 64.0 in | Wt 201.2 lb

## 2019-12-21 DIAGNOSIS — G7000 Myasthenia gravis without (acute) exacerbation: Secondary | ICD-10-CM

## 2019-12-21 DIAGNOSIS — H02402 Unspecified ptosis of left eyelid: Secondary | ICD-10-CM

## 2019-12-21 NOTE — Progress Notes (Signed)
[facility]  PATIENT: [name] DOB: 07-17-1945   REASON FOR VISIT: Follow-up for ocular myasthenia HISTORY FROM: Patient    HISTORY OF PRESENT ILLNESS: Initial Consult  03/07/16 PS;  [name] is [name] whose had droopy eyelids first started in December 2016 when she stated states that "I was drooping and she will barely open it. She was seen by primary physician who thought she had partial Bell's palsy. This did not get better and subsequently in June she saw ophthalmologist who noticed that she had some infection on the lower eyelid treated with antibiotics. Since then she started that she has intermittent opening of both eyelids left more than right. There are times when arises is completely shut and on other times she can see well. In fact on inquiry she states that a couple of occasion while driving she was seeing double the dividing lines of the road for crossing over. She is also noticed that she gets fatigued and tired very easily. Initially when this first began this was thought to be related to viral infection but this does not seem to be getting better. She denies any difficulty moving her eyes, any dysphagia, weakness in her arms or legs. She has no trouble getting out, chair or climbing up and down steps. She has a good appetite and she had not lost any weight. Review of electronic medical record shows that she had elevated acetylcholine receptor antibodies on 01/15/16 with acetylcholine binding antibody of 6.16, blocking antibody 31 and modulating antibody 48 all of which are abnormal. Patient has not been tried on Mestinon on other medications for myasthenia. She denies any history of headaches, strokes, TIAs or other neurologic problems. Update 1/16/2018PS : She returns for follow-up after last visit 2 months ago. She states she has noticed benefit on Mestinon and eyes do not droop as much and she is not had any episodes of diplopia.  She doesn't feel tired as much as well. However she was unable to tolerate 60 mg dose of Mestinon due to stomach cramps, nausea and is taking only 30 mg mostly 3 times a day and takes an occasional fourth dose if needed. She did undergo nerve conduction study with rapid repetitive stimulation on 04/10/16 which was negative for decremental response. The patient is reluctant to consider prednisone given her diabetes and risk of blood sugars being elevated on it UPDATE 04/19/2018CM Crystal. [name], 74 year old female returns for follow-up with history of ocular myasthenia. She is currently on Mestinon sometimes taking a full pill but mostly half pills due to nausea. She is a diabetic so she does not want to take prednisone. She has had no double vision blurred vision or any drooping of her left eye. Vision today 20/50 right 20/40 left.She denies any difficulty moving her eyes, any dysphagia, weakness in her arms or legs. She has no trouble getting out, chair or climbing up and down steps.  She continues to be active. She returns for reevaluation. She is requesting something for nausea UPDATE 10/17/2018CM  Crystal. [name], 74 year old female returns for follow-up with a history of ocular myasthenia with symptoms beginning in June 2016. She denies any episodes of diplopia, no ptosis seen today. Vision today 2070 bilaterally. She denies any weakness in her arms or legs or any problems with breathing she continues to be quite active. She continues to drive without difficulty she had 1 recent fall no injury.  She had a recent visit to her ophthalmologist.  She is currently on Mestinon sometimes takes a full pill and sometimes a half pill. She has nausea but  Zofran did not help, she returns for reevaluation. Recently diagnosed with arthritis. Update 08/19/2017 :she returns for follow-up after last visit 6 months ago. She states her cholesterol myasthenia symptoms are well controlled. She denies any drooping of the eyelids, diplopia,  muscle fatigue or weakness. She states she is tired all the time but this may be related to sleep apnea and lack of exercise. She is currently on Mestinon 30 mg 4 times daily and is tolerating it quite well. She was unable to tolerate 60 mg due to diarrhea and upset stomach. She has no new complaints today. She does complain of some intermittent trouble swallowing when she thinks she swallows too quickly at times gets stuck in the middle of the chest. She denies any nasal regurg, nasal twang to her speech heart food being stuck in the back of her mouth.She states her blood pressure and sugar control have been quite good. 10/25/2019 : She returns for follow-up after last visit 2 years ago.  She states she was doing fine with her ptosis and myasthenia until 2 weeks ago when she is noticed increased droopiness of the left eye particularly towards the afternoon.  In the mornings her eyes seem much more open.  She called Dr. Jannifer Franklin who recommended increasing her Mestinon which she was taking 30 mg 4 times daily to 60 mg 3 times daily.  She has not noticed any specific benefit.  She does have mild diarrhea and nausea but is tolerating it otherwise well.  Patient in the past has not been able to tolerate higher doses of Mestinon.  Dr. Jimmye Norman did discuss adding prednisone but the patient is reluctant to try this because of her diabetes and worsening sugars.  Patient denies any weakness in her extremities, significant fatigue or tiredness.  She denies any recent respiratory illness in the form of infection.  She did lose her balance 1 she tripped but did not hurt herself.  She does have some intermittent diplopia particularly with sustained upgaze.    Update 12/21/2019 JM: Crystal. [name] returns today for 73-month follow-up regarding ocular myasthenia  After prior visit, complaints of worsening left eye drooping Dr. Leonie Man recommended increasing Mestinon dosage to 60 mg 4 times daily Reports improvement of left eye symptoms  but continues to have some droopiness especially when outdoors or in bright light and diplopia if both eyes are open Side effects of diarrhea and nausea with benefit with use of Imodium and a " nausea medication" that PCP started her on -she is unable to state name She is frustrated regarding continued symptoms and she has not been able to return to driving Denies any extremity weakness, fatigue or imbalance  She was reluctantly placed on prednisone in July due to severe back and hip pain Reports benefit of left eye symptoms and did not notice impact on her glucose levels  No further concerns       REVIEW OF SYSTEMS: Full 14 system review of systems performed and notable only for those listed, all others are neg:  Intermittent diplopia, drooping of the left eyelid only. All systems negative  ALLERGIES: Allergies  Allergen Reactions  . Celebrex [Celecoxib] Hives and Swelling    SWELLING REACTION UNSPECIFIED   . Vioxx [Rofecoxib] Hives  . Latex Rash    HOME MEDICATIONS: Outpatient Medications Prior to Visit  Medication Sig Dispense Refill  . ACCU-CHEK AVIVA PLUS  test strip Apply 1 strip topically daily. for testing  1  . ACCU-CHEK SOFTCLIX LANCETS lancets TEST DAILY E11.9  1  . APPLE CIDER VINEGAR PO Take 1 tablet by mouth daily.     . Biotin w/ Vitamins C & E (HAIR/SKIN/NAILS PO) Take 1 tablet by mouth daily.    . Calcium Carb-Cholecalciferol (CALCIUM+D3) 600-800 MG-UNIT TABS Take 1 tablet by mouth daily.    . cetirizine (ZYRTEC) 10 MG tablet Take 10 mg by mouth at bedtime.   0  . Co-Enzyme Q-10 100 MG CAPS Take 100 mg by mouth daily.     . diclofenac sodium (VOLTAREN) 1 % GEL Apply 1 application topically 3 (three) times daily as needed (arthritis).     Marland Kitchen empagliflozin (JARDIANCE) 25 MG TABS tablet Jardiance 25 mg tablet    . fluticasone (FLONASE) 50 MCG/ACT nasal spray Place 2 sprays into both nostrils at bedtime.     . gabapentin (NEURONTIN) 300 MG capsule Take 300 mg by  mouth at bedtime.     . Ginger, Zingiber officinalis, (GINGER ROOT) 550 MG CAPS Take 550 mg by mouth daily.     Marland Kitchen glipiZIDE (GLUCOTROL XL) 10 MG 24 hr tablet Take 10 mg by mouth 2 (two) times daily.     Marland Kitchen levOCARNitine Fumarate 500 MG TABS Take 500 mg by mouth daily.     Marland Kitchen losartan-hydrochlorothiazide (HYZAAR) 100-25 MG tablet Take 1 tablet by mouth every evening.     . metFORMIN (GLUCOPHAGE-XR) 500 MG 24 hr tablet Take 1,000 mg by mouth 2 (two) times daily with a meal.     . metoprolol tartrate (LOPRESSOR) 25 MG tablet Take 12.5 mg by mouth daily.    . Misc Natural Products (TART CHERRY ADVANCED) CAPS Take 1 capsule by mouth 2 (two) times daily.     . Misc. Devices MISC Avivia Plus lancets and strips. Tests daily  DX E11.9    . multivitamin-lutein (OCUVITE-LUTEIN) CAPS capsule Take 1 capsule by mouth daily.    . Nutritional Supplements (EQ ESTROBLEND MENOPAUSE) TABS Take 1 tablet by mouth every evening.     Marland Kitchen omega-3 acid ethyl esters (LOVAZA) 1 g capsule Take 1 g by mouth daily.    . pregabalin (LYRICA) 50 MG capsule Take 50 mg by mouth 2 (two) times daily.     Marland Kitchen pyridostigmine (MESTINON) 60 MG tablet Take 1 tablet (60 mg total) by mouth 4 (four) times daily. 120 tablet 3  . Rosuvastatin Calcium 10 MG CPSP Take 5 mg by mouth at bedtime.     . sitaGLIPtin (JANUVIA) 100 MG tablet Take 100 mg by mouth daily.     . TRULICITY 1.5 ZO/1.0RU SOPN Inject 1.5 mLs into the skin once a week.    . Turmeric Curcumin 500 MG CAPS Take 500 mg by mouth every evening.     Marland Kitchen VITAMIN D PO Take by mouth.     No facility-administered medications prior to visit.    PAST MEDICAL HISTORY: Past Medical History:  Diagnosis Date  . Arthritis   . Bursitis   . Diabetes mellitus without complication ([place])   . Dyspnea   . Fibromyalgia   . GERD (gastroesophageal reflux disease)   . Hyperlipemia   . Hypertension   . 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 Sinusitis   . Sleep apnea    uses a cpap  . Sleep apnea    . Wears glasses     PAST SURGICAL HISTORY: Past Surgical History:  Procedure Laterality Date  . ABDOMINAL HYSTERECTOMY  1999  . BREAST SURGERY  2002   rt br bx  . CERVICAL FUSION  2007  . CHOLECYSTECTOMY  2005   lap choli  . COLONOSCOPY    . FINGER GANGLION CYST EXCISION    . INCISION AND DRAINAGE ABSCESS     Staph infection with multiple abdominal wounds s/p I&D in [place] ~ 2014  . SHOULDER ARTHROSCOPY WITH OPEN ROTATOR CUFF REPAIR AND DISTAL CLAVICLE ACROMINECTOMY Right 09/09/2012   Procedure: Right Shoulder Arthroscopy with distal clavicle excision and mini-open rotator cuff repair.;  Surgeon: Alta Corning, MD;  Location: [facility];  Service: Orthopedics;  Laterality: Right;  . TONSILLECTOMY      FAMILY HISTORY: Family History  Problem Relation Age of Onset  . Emphysema Father   . Intracerebral hemorrhage Sister   . Myasthenia gravis Neg Hx        none that she knows of     SOCIAL HISTORY: Social History   Socioeconomic History  . Marital status: Married    Spouse name: Not on file  . Number of children: Not on file  . Years of education: Not on file  . Highest education level: Not on file  Occupational History  . Occupation: Retired  Tobacco Use  . Smoking status: Never Smoker  . Smokeless tobacco: Never Used  Vaping Use  . Vaping Use: Never used  Substance and Sexual Activity  . Alcohol use: No  . Drug use: No  . Sexual activity: Not on file  Other Topics Concern  . Not on file  Social History Narrative   Lives at home with husband   Right handed   Caffeine: maybe 1 cup/day   Social Determinants of Health   Financial Resource Strain:   . Difficulty of Paying Living Expenses: Not on file  Food Insecurity:   . Worried About Charity fundraiser in the Last Year: Not on file  . Ran Out of Food in the Last Year: Not on file  Transportation Needs:   . Lack of Transportation (Medical): Not on file  . Lack of Transportation (Non-Medical):  Not on file  Physical Activity:   . Days of Exercise per Week: Not on file  . Minutes of Exercise per Session: Not on file  Stress:   . Feeling of Stress : Not on file  Social Connections:   . Frequency of Communication with Friends and Family: Not on file  . Frequency of Social Gatherings with Friends and Family: Not on file  . Attends Religious Services: Not on file  . Active Member of Clubs or Organizations: Not on file  . Attends Archivist Meetings: Not on file  . Marital Status: Not on file  Intimate Partner Violence:   . Fear of Current or Ex-Partner: Not on file  . Emotionally Abused: Not on file  . Physically Abused: Not on file  . Sexually Abused: Not on file     PHYSICAL EXAM  Vitals:   12/21/19 1242  BP: 126/84  Pulse: 80  Weight: 201 lb 3.2 oz (91.3 kg)  Height: 5\' 4"  (1.626 m)   Body mass index is 34.54 kg/m.  Generalized: Pleasant elderly Caucasian lady,Obese female in no acute distress  Head: normocephalic and atraumatic,.   Neck: Supple, no carotid bruits  Cardiac: Regular rate rhythm, no murmur  Musculoskeletal: No deformity   Neurological examination   Mentation: Alert oriented to time, place, history taking. Attention  span and concentration appropriate. Recent and remote memory intact.  Follows all commands speech and language fluent.  Cranial nerve II-XII: Pupils were equal round reactive to light extraocular movements were full, no nystagmus, left eye drooping at rest which fluctuates during the visit and at times is nearly complete.  With effort to open the eyes she can open it with sustained upgaze, bilateral left > right ptosis, subjective mild diplopia on sustained upgaze  . Facial sensation and strength were normal. hearing was intact to finger rubbing bilaterally. Uvula tongue midline. head turning and shoulder shrug were normal and symmetric.Tongue protrusion into cheek strength was normal. Motor: normal bulk and tone, full strength in  the BUE, BLE, except decreased weakness distally right  lower extremity,  fine finger movements normal Sensory: normal and symmetric to light touch, pinprick, and  Vibration, in the upper and lower extremities  Coordination: finger-nose-finger, heel-to-shin bilaterally, no dysmetria Reflexes: 1+ upper lower and symmetric, plantar responses were flexor bilaterally. Gait and Station: Rising up from seated position without assistance, normal stance,  moderate stride, good arm swing, smooth turning. Ambulates without assistive device     ASSESSMENT AND PLAN 89 year Caucasian lady with gradual onset drooping of eyelids in December 2016 due to ocular myasthenia. She has positive acetylcholine receptor antibodies. She has worsening of left eye drooping and some intermittent diplopia in recent months after being stable for the past 3 years.    -Continue Mestinon 60 mg 4 times daily -Continues to experience symptoms but overall improving.  Hesitant to increase dosage further due to already reported side effects of diarrhea and nausea requiring additional medication -Consider prednisone if symptoms worsen or has worsening side effects but she would like to hold off at this time as her symptoms have been improving (previously denied use of prednisone due to history of diabetes but recently had short course of prednisone due to back pain without hyperglycemia) -Advised to refrain from driving until vision improves  Recommend follow-up in 3 months with Dr. Leonie Man or call earlier if needed Today's visit was supposed to be follow-up with Dr. Leonie Man but he is out of the office therefore she will follow-up with him at next visit   I spent 25 minutes of face-to-face and non-face-to-face time with patient.  This included previsit chart review, lab review, study review, order entry, electronic health record documentation, patient education regarding ocular myasthenia, use of Mestinon and current side effects,  further treatment options if indicated and answered all questions to patient satisfaction  Frann Rider, [facility]  [facility] [address] [place], [place] [address]  Phone [phone] Fax [phone] Note: This document was prepared with digital dictation and possible smart phrase technology. Any transcriptional errors that result from this process are unintentional.

## 2019-12-21 NOTE — Patient Instructions (Signed)
Your Plan:  Continue current dose of Mestiton 60mg  four times daily  Please call office if any worsening side effects or symptoms    Follow up in 3 months or call earlier if needed     Thank you for coming to see [place] at [facility]. I hope we have been able to provide you high quality care today.  You may receive a patient satisfaction survey over the next few weeks. We would appreciate your feedback and comments so that we may continue to improve ourselves and the health of our patients.

## 2019-12-28 DIAGNOSIS — I10 Essential (primary) hypertension: Secondary | ICD-10-CM | POA: Diagnosis not present

## 2019-12-28 DIAGNOSIS — E1165 Type 2 diabetes mellitus with hyperglycemia: Secondary | ICD-10-CM | POA: Diagnosis not present

## 2019-12-28 DIAGNOSIS — E7849 Other hyperlipidemia: Secondary | ICD-10-CM | POA: Diagnosis not present

## 2019-12-28 NOTE — Progress Notes (Signed)
I agree with the above plan 

## 2020-01-13 ENCOUNTER — Other Ambulatory Visit: Payer: Self-pay

## 2020-01-13 ENCOUNTER — Telehealth: Payer: Self-pay | Admitting: Internal Medicine

## 2020-01-13 ENCOUNTER — Ambulatory Visit: Payer: PPO | Admitting: Internal Medicine

## 2020-01-13 VITALS — BP 120/70 | HR 80 | Temp 97.4°F | Ht 64.0 in | Wt 196.8 lb

## 2020-01-13 DIAGNOSIS — Z23 Encounter for immunization: Secondary | ICD-10-CM

## 2020-01-13 DIAGNOSIS — R768 Other specified abnormal immunological findings in serum: Secondary | ICD-10-CM

## 2020-01-13 DIAGNOSIS — J849 Interstitial pulmonary disease, unspecified: Secondary | ICD-10-CM

## 2020-01-13 DIAGNOSIS — G7000 Myasthenia gravis without (acute) exacerbation: Secondary | ICD-10-CM | POA: Diagnosis not present

## 2020-01-13 NOTE — Patient Instructions (Addendum)
ICD-10-CM   1. 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 Myasthenia gravis ([place])  G70.00   4. 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  Right now we are concluding that this antibody is contributing to pulmonary fibrosis  Nevertheless the pulmonary fibrosis itself is stable  Plan -High-dose flu shot today  -Given the fact I am suspecting IPAF and the fact your myasthenia gravis that is quite active -> I do not recommend biopsy of the lung anymore  -I recommend watchful waiting and careful monitoring  -I do not recommend antifibrotic's because your pulmonary fibrosis is stable and the drugs can cause nausea which he already having because of the myasthenia drugs   -Please address your myasthenia gravis with neurology [I will send him a phone note currently]  - continue cpap   -respect desire not to have ono test on RA  Follow-up -Do spirometry and DLCO in 4-6 months -Return to see Dr. Chase Caller 30-minute slot in 4-6 months  -ILD symptom questionnaires and simple walk test at follow-up

## 2020-01-13 NOTE — Telephone Encounter (Signed)
Thanks. I will speak to [place] and see if we need to change meds

## 2020-01-13 NOTE — Telephone Encounter (Signed)
Pramod and JEssica  I saw [name] in pulmonary clinic.  Her high-resolution CT chest shows stable pulmonary fibrosis over a year.  Her diffusion capacity is also stable but her forced vital capacity has declined modestly around 4.9%.  I think this is because of the myasthenia.  When I walked into the room she was in significant ptosis.  She tells me that she is actually better than when she saw Janett Billow in end of August 2021 but apparently the [place] is causing significant nausea and she is not able to uptitrate  Thank you for attention   PFT Results Latest Ref Rng & Units 08/10/2019 06/02/2018 06/03/2017 12/03/2016  FVC-Pre L 2.47 2.59 2.66 2.66  FVC-Predicted Pre % 88 91 93 92  FVC-Post L - - - 2.49  FVC-Predicted Post % - - - 86  Pre FEV1/FVC % % 92 92 89 90  Post FEV1/FCV % % - - - 95  FEV1-Pre L 2.27 2.38 2.38 2.39  FEV1-Predicted Pre % 107 110 110 110  FEV1-Post L - - - 2.35  DLCO uncorrected ml/min/mmHg 17.01 15.58 16.35 17.17  DLCO UNC% % 91 83 71 74  DLCO corrected ml/min/mmHg 17.52 - - 18.34  DLCO COR %Predicted % 93 - - 80  DLVA Predicted % 101 90 84 98  TLC L - - - 4.43  TLC % Predicted % - - - 90  RV % Predicted % - - - 79

## 2020-01-13 NOTE — Progress Notes (Signed)
IOV 06/04/2016  Chief Complaint  Patient presents with  . Advice Only    referred by Dr. Estanislado Pandy for ILD, abn ct.     74 year old female referred by Dr. Keturah Barre in the rheumatology clinic for evaluation of dyspnea in the setting of positive CCP and CT chest suggestive of ILD  History is taken from her and review of the outside chart of rheumatology and neurology January 2018  She is significantly obese although this is been stable for the last several years. Several years ago she developed knee issues and is wearing a brace/shoulder and her right lower extremity. Since then she's been less mobile and less active. Corresponding with this she's had insidious onset of dyspnea on exertion for climbing stairs and other activities that are class II-III nature. This dyspnea has been stable all along without any associated weight change. It is present with exertion relieved by rest. Is known orthopnea paroxysmal nocturnal dyspnea. She's had occasional associated dry cough but this is not consistent in the same with wheezing which is not consistent. There is no associated chest pain.  According to her history she's been seeing Dr. 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 According to her and review of the chart this is localized only to the ocular muscles she is on pyridostigmine. At this time a high-resolution CT chest was done 04/27/2016 that I personally visualized that shows coronary artery calcification which is unaware of and also mild subtle ILD changes. She subsequently saw Dr. Blake Divine January 2018 and autoimmune profile showed elevated CCP that was isolated but no clinical or systemic evidence of rheumatoid arthritis. The diagnoses on the joints is still osteoarthritis. Therefore she's been referred here. There is no associated chest pain     review of the chart shows she's not had an echocardiogram a  cardiac stress test btu she tells me she has one at [facility] with her cardioloigist 2 years ago was normal  Walking desaturation test 185 feet 3 laps on room air: Did not desaturate.  Results for [name], [name] (MRN [id number]) as of 06/04/2016 15:47  Ref. Range 01/15/2016 13:00  Acetylchol Block Ab Latest Ref Range: 0 - 25 % 31 (H)  Acety choline binding ab Latest Ref Range: 0.00 - 0.24 nmol/L 6.16 (H)  Acetylcholine Modulat Ab Latest Ref Range: 0 - 20 % 48 (H)   Results for [name], [name] (MRN [id number]) as of 06/04/2016 15:47  Ref. Range 05/02/2016 10:00  CK Total Latest Ref Range: 7 - 177 U/L 99  Sed Rate Latest Ref Range: 0 - 30 mm/hr 4  Anit Nuclear Antibody(ANA) Latest Ref Range: NEGATIVE  NEG  Angiotensin-Converting Enzyme Latest Ref Range: 9 - 67 U/L 34  Cyclic Citrullin Peptide Ab Latest Units: Units 109 (H)  ds DNA Ab Latest Units: IU/mL <1  Myeloperoxidase Abs Latest Ref Range: <1.0 AI  <1.0  Serine Protease 3 Latest Ref Range: <1.0 AI  <1.0  RA Latex Turbid. Latest Ref Range: <14 IU/mL <14  ENA SM Ab Ser-aCnc Latest Ref Range: <1.0 NEG AI  <1.0 NEG  Ribonucleic Protein(ENA) Antibody, IgG Latest Ref Range: <1.0 NEG AI  <1.0 NEG  SSA (Ro) (ENA) Antibody, IgG Latest Ref Range: <1.0 NEG AI  <1.0 NEG  SSB (La) (ENA) Antibody, IgG Latest Ref Range: <1.0 NEG AI  <1.0 NEG  Scleroderma (Scl-70) (ENA) Antibody, IgG Latest Ref Range: <1.0 NEG  AI  <1.0 NEG      OV 12/03/2016  Chief Complaint  Patient presents with  . Follow-up    Pt states her breathing is unchanged since last OV. Pt denies significant cough, CP/tightness, f/c/s.    Follow-up shortness of breath that is multifactorial but associated with interstitial lung disease on CT scan of the chest December 2017 associated with positive CCP antibody and a diagnosis of osteoarthritis but not rheumatoid arthritis in the joints  She continues to be stable. There are no interim issues no chest pain. She only has mild shortness of  breath with exertion. Her ocular myasthenia is improved. Pulmonary function test shows mild reduction in isolated diffusion capacity 74%. Forced vital capacity and lung volumes are normal. High-resolution CT chest shows subtle ILD in the bases non-UIP pattern.  CT chest high resolution IMPRESSION: 1. 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 2. Aortic atherosclerosis, in addition to left main and 3 vessel coronary artery disease. Please note that although the presence of coronary artery calcium documents the presence of coronary artery disease, the severity of this disease and any potential stenosis cannot be assessed on this non-gated CT examination. Assessment for potential risk factor modification, dietary therapy or pharmacologic therapy may be warranted, if clinically indicated. 3. Mild hepatic steatosis. 4. Additional incidental findings, as above.  Aortic Atherosclerosis (ICD10-I70.0).   Electronically Signed   By: Vinnie Langton M.D.   On: 11/29/2016 10:15    OV 06/03/2017   Chief Complaint  Patient presents with  . Follow-up    PFT done today.  Pt states her breathing is about the same as last visit.  Pt recently had a sinus infection but is not currently on any meds. Has complaints of a cough with green mucus. Denies any CP.    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  In the setting of myasthenia gravis  Follow-up coronary artery calcification  Last visit August 2018.  Overall stable since then.  She reports significant dyspnea when climbing stairs but otherwise doing well.  Overall stable.  And walking desaturation test she got very tachycardic even though she did not desaturate.  She is known to have  coronary artery calcification and I referred her to cardiology but today she tells me that she follows for this at [facility] with Dr. Claudie Leach and her stress test is upcoming.  She has never been to pulmonary rehabilitation.  There is no associated chest pain or cough.  Follow pulmonary function test shows continued stability but perhaps reduction in DLCO.  On exam she has no crackles.     Walking desaturation test on 06/03/2017 185 feet x 3 laps on ROOM AIR:  did NOT desaturate. Rest pulse ox was 100%, final pulse ox was 99%. HR response 75/min at rest to 112/min at peak exertion. Patient [name]  Did ot Desaturate < 88% . [name] did not  Desaturated </= 3% points. [name] yes did get tachyardic   OV 03/03/2018  Subjective:  Patient ID: [name], female , DOB: 1946-01-30 , age 16 y.o. , MRN: [id number] , ADDRESS: [address] [place] [address]   03/03/2018 -   Chief Complaint  Patient presents with  . Follow-up    pt c/o sob with  exertion.  denies cough, chest congestion.     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  In the setting of myasthenia gravis  Also coronary arter calcification  -     HPI [name] 74 y.o. -returns for follow-up of ILD not otherwise specified.  After last visit she did see her cardiologist.  According to her there is no discussion about the coronary artery calcification but she recollects a normal stress test with the last few to several years.  She started attending pulmonary rehabilitation for the last few to several months.  She is been doing well with this and improved dyspnea.  Overall she is stable.  She had high-resolution CT chest in October 2019 that is unchanged from February 2018.  The pattern is described as either indeterminate or alternative diagnosis.   This seems to be some craniocaudal gradient with reticulations and traction bronchiectasis.  However there is some air trapping as well.  She is attending pulmonary rehabilitation and this is improved her dyspnea.  However the etiology of her ILD still unknown.  She did see Dr. D rheumatologist January 30, 2018 and has been advised that she has osteoarthritis based on my chart review.  She is on as needed follow-up.  Patient is wondering about the etiology of her ILD and is wondering about the safety of surgical lung biopsy.  Did a history retake and she did admit to having RAYNAUD phenomenon mild in the last few winters of her fingers.  T    Results for [name], [name] (MRN [id number]) as of 06/03/2017 11:52  Ref. Range 12/03/2016 09:51 06/03/2017 10:48  FVC-Pre Latest Units: L 2.66 2.66  FVC-%Pred-Pre Latest Units: % 92 93   Results for RIELLY, BRUNN (MRN [id number]) as of 06/03/2017 11:52  Ref. Range 12/03/2016 09:51 06/03/2017 10:48  DLCO unc Latest Units: ml/min/mmHg 17.17 16.35  DLCO unc % pred Latest Units: % 74 71     HRCT 01/30/18 - [place]MPARISON:  11/29/2016 high-resolution chest CT.  FINDINGS: Lungs/Pleura: No pneumothorax. No pleural effusion. No acute consolidative airspace disease, lung masses or significant pulmonary nodules. There is mild patchy subpleural reticulation and ground-glass attenuation in both lungs with associated mild traction bronchiectasis. There is a basilar gradient to these findings. No frank honeycombing. Mild patchy air trapping in both lungs on the expiration sequence. Interval stability of these findings.  Upper abdomen: Cholecystectomy.  Musculoskeletal: No aggressive appearing focal osseous lesions. Marked thoracic spondylosis. Partially visualized surgical hardware from ACDF in the lower cervical spine.  IMPRESSION: 1. Continued stability of basilar predominant fibrotic interstitial lung disease without frank honeycombing. Findings are  considered most compatible with an alternative diagnosis to UIP per ATS consensus criteria. 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 2. Mild patchy air trapping in both lungs, indicative of small airways disease. 3. Three-vessel coronary atherosclerosis.  Aortic Atherosclerosis (ICD10-I70.0).   Electronically Signed   By: Ilona Sorrel M.D.   On: 01/30/2018 08:07   IMPRESSION: Mild incomplete relaxation of the cricopharyngeus, but otherwise normal for age esophagram.   Electronically Signed   By: Genevie Ann M.D.   On: 02/10/2018 10:  OV 06/02/2018  Subjective:  Patient ID: [name], female , DOB: 1945/07/09 , age 53 y.o. , MRN: [id number] , ADDRESS: [place] [address]   06/02/2018 -   Chief Complaint  Patient presents with  . Follow-up  PFT performed today.  Pt states she has been doing good since last visit and denies any complaints.    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  In the setting of myasthenia gravis    HPI JASMYNE LODATO 74 y.o. -reports for follow-up.  In the interim she has had no new problems.  She had pulmonary function test.  The FVC is stable/slight decline.  Similarly DLCO even though the percentage is higher the wrong number shows possible decline.  This is because of the new equation called GLI.e. question that we are using.  Because of various possibilities of ILD #29 visit we did extensive autoimmune panel and this was all negative.  Currently the only positive result is the CCP antibody which was 109 in January 2018.  I have not over connective tissue disease history again but she denies.  In fact this time I asked her about RAYNAUD again and at this time she denies.  There are no new issues.  We discussed about the need to do a surgical  lung biopsy.  She is somewhat nervous because of obesity, sleep apnea and myasthenia gravis.  However, she is open to talking to the surgeon about it.  She is attending pulmonary rehabilitation and this helps her.       ROS - per HPI    OV 08/31/2019  Subjective:  Patient ID: [name], female , DOB: 08/04/1945 , age 45 y.o. , MRN: [id number] , ADDRESS: [place] [address]   08/31/2019 -   Chief Complaint  Patient presents with  . 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  In the setting of myasthenia gravis    HPI [name] 74 y.o. -6 presents for follow-up of her interstitial lung disease.  I last saw her in February 2020.  Referred to Dr. Roxan Hockey for surgical lung biopsy because of indeterminate UIP pattern.  She is scheduled the surgery within the pandemic broke out in March 2020 the surgery was canceled.  She has been isolating.  No new interim issues.  She has had a Covid vaccine.  She never developed Covid.  Her dyspnea is stable.  Stairs make it more difficult.  Dyspnea is relieved by rest rest of the symptoms listed below.  She is still open to having surgical lung biopsy.  Pulmonary function test done shows decrease in FVC but increase in DLCO again suggesting technical issues.  Her walking desaturation test is normal.  She is attending pulmonary rehabilitation at [facility].     OV 01/13/2020   Subjective:  Patient ID: [name], female , DOB: 13-Mar-1946, age 28 y.o. years. 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  In the setting of myasthenia gravis  Also OSA on CPAP on room air   Chief Complaint  Patient presents with  . Follow-up    pt is sob on exertion  HPI [name] 74 y.o. -returns for follow-up.  Last seen in May 2021.  At the time wanted to restage her ILD.  Today she tells me she is overall stable but then her symptom score for dyspnea shows significant worsening of dyspnea.  In talking to her I learned that her myasthenia gravis is now in flareup.  She has seen neurology last seen by Frann Rider end of August 2021.  They have asked her to uptitrate her Mestinon which she is doing with this causing significant nausea.  She says that she still feels tired and has difficulty with vision and easily has ptosis.  She does use a CPAP for her sleep apnea.  In terms of her lung function her FVC has declined 5% roughly since last year but the DLCO itself is stable.  Her high-resolution CT chest shows that her ILD itself is stable over nearly 2 years.  Her walking desaturation test shows tendency to drop pulse oximeter but this in the setting of both ILD and active myasthenia gravis.    SYMPTOM SCALE - ILD 08/31/2019  01/13/2020   O2 use ra ra  Shortness of Breath 0 -> 5 scale with 5 being worst (score 6 If unable to do)   At rest 0 2  Simple tasks - showers, clothes change, eating, shaving 0 2  Household (dishes, doing bed, laundry) 0 3  Shopping 1 3  Walking level at own pace 2 3  Walking up Stairs 4 2  Total (30-36) Dyspnea Score 7 15  How bad is your cough? 1 1  How bad is your fatigue 4 4  How bad is nausea 0 4  How bad is vomiting?  0 0  How bad is diarrhea? 2 4  How bad is anxiety? 2 2  How bad is depression 2 1         Simple office walk 185 feet x  3 laps goal with forehead probe 03/03/2018  06/02/2018  08/31/2019  01/13/2020   O2 used Room air Room air  Room air ra  Number laps completed 3 3 3 3   Comments about pace normal normal normal   Resting Pulse Ox/HR 100% and 66/min 98% and 72/min 99% and 74/min 98% and 62/min  Final Pulse Ox/HR 99% and 108/min 96% and 57/mn 98% and 106/min 91% and 74/min  Desaturated </= 88% no no no no  Desaturated <= 3% points no no no Yes, 7 points  Got Tachycardic >/= 90/min yes no yes no  Symptoms at end of test x Mild dyspnea Dyspnea at 3rd lap No dyspnea  Miscellaneous comments x      ROS - per HPI  PFT Results Latest Ref Rng & Units 08/10/2019 06/02/2018 06/03/2017 12/03/2016  FVC-Pre L 2.47 2.59 2.66 2.66  FVC-Predicted Pre % 88 91 93 92  FVC-Post L - - - 2.49  FVC-Predicted Post % - - - 86  Pre FEV1/FVC % % 92 92 89 90  Post FEV1/FCV % % - - - 95  FEV1-Pre L 2.27 2.38 2.38 2.39  FEV1-Predicted Pre % 107 110 110 110  FEV1-Post L - - - 2.35  DLCO uncorrected ml/min/mmHg 17.01 15.58 16.35 17.17  DLCO UNC% % 91 83 71 74  DLCO corrected ml/min/mmHg 17.52 - - 18.34  DLCO COR %Predicted % 93 - - 80  DLVA Predicted % 101 90 84 98  TLC L - - - 4.43  TLC % Predicted % - - - 90  RV % Predicted % - - - 79     IMPRESSION: High-resolution CT chest May 2021 Spectrum of findings compatible with basilar predominant fibrotic interstitial lung disease without frank honeycombing, without convincing interval progression since 01/29/2018 or since the baseline 04/10/2016 study. 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 Findings are indeterminate for UIP per consensus guidelines: Diagnosis of Idiopathic Pulmonary Fibrosis: An Official ATS/ERS/JRS/ALAT Clinical Practice Guideline. Kell, Iss 5, [id number], Dec 28 2016.  Three-vessel coronary atherosclerosis.  Aortic Atherosclerosis (ICD10-I70.0).   Electronically Signed   By: Ilona Sorrel M.D.   On: 09/08/2019 15:40     has a past medical history  of Arthritis, Bursitis, Diabetes mellitus without complication ([place]), Dyspnea, Fibromyalgia, GERD (gastroesophageal reflux disease), Hyperlipemia, Hypertension, Myasthenia gravis ([place]), Sinusitis, Sleep apnea, Sleep apnea, and Wears glasses.   reports that she has never smoked. She has never used smokeless tobacco.  Past Surgical History:  Procedure Laterality Date  . ABDOMINAL HYSTERECTOMY  1999  . BREAST SURGERY  2002   rt br bx  . CERVICAL FUSION  2007  . CHOLECYSTECTOMY  2005   lap choli  . COLONOSCOPY    . FINGER GANGLION CYST EXCISION    . INCISION AND DRAINAGE ABSCESS     Staph infection with multiple abdominal wounds s/p I&D in [place] ~ 2014  . SHOULDER ARTHROSCOPY WITH OPEN ROTATOR CUFF REPAIR AND DISTAL CLAVICLE ACROMINECTOMY Right 09/09/2012   Procedure: Right Shoulder Arthroscopy with distal clavicle excision and mini-open rotator cuff repair.;  Surgeon: Alta Corning, MD;  Location: [facility];  Service: Orthopedics;  Laterality: Right;  . TONSILLECTOMY      Allergies  Allergen Reactions  . Celebrex [Celecoxib] Hives and Swelling    SWELLING REACTION UNSPECIFIED   . Vioxx [Rofecoxib] Hives  . Latex Rash    Immunization History  Administered Date(s) Administered  . Influenza Split 02/02/2016  . Influenza, High Dose Seasonal PF 03/08/2015, 01/27/2017, 02/20/2017, 01/27/2018, 01/13/2020  . Moderna SARS-COVID-2 Vaccination 05/04/2019, 07/27/2019  . Pneumococcal Conjugate-13 06/01/2015  . Pneumococcal Polysaccharide-23 01/28/2012  . Pneumococcal-Unspecified 06/04/2013  . Tdap 04/29/2009  . Zoster Recombinat (Shingrix) 12/06/2019    Family History  Problem Relation Age of Onset  . Emphysema Father   . Intracerebral hemorrhage Sister   . Myasthenia gravis Neg Hx        none that she knows of      Current Outpatient Medications:  .  ACCU-CHEK AVIVA PLUS test strip, Apply 1 strip topically daily. for testing, Disp: , Rfl: 1 .  ACCU-CHEK SOFTCLIX  LANCETS lancets, TEST DAILY E11.9, Disp: , Rfl: 1 .  APPLE CIDER VINEGAR PO, Take 1 tablet by mouth daily. , Disp: , Rfl:  .  Biotin w/ Vitamins C & E (HAIR/SKIN/NAILS PO), Take 1 tablet by mouth daily., Disp: , Rfl:  .  Calcium Carb-Cholecalciferol (CALCIUM+D3) 600-800 MG-UNIT TABS, Take 1 tablet by mouth daily., Disp: , Rfl:  .  cetirizine (ZYRTEC) 10 MG tablet, Take 10 mg by mouth at bedtime. , Disp: , Rfl: 0 .  Co-Enzyme Q-10 100 MG CAPS, Take 100 mg by mouth daily. , Disp: , Rfl:  .  diclofenac sodium (VOLTAREN) 1 % GEL, Apply 1 application topically 3 (three) times daily as needed (arthritis). , Disp: , Rfl:  .  empagliflozin (JARDIANCE) 25 MG TABS tablet, Jardiance 25 mg tablet, Disp: , Rfl:  .  fluticasone (FLONASE) 50 MCG/ACT  nasal spray, Place 2 sprays into both nostrils at bedtime. , Disp: , Rfl:  .  gabapentin (NEURONTIN) 300 MG capsule, Take 300 mg by mouth at bedtime. , Disp: , Rfl:  .  Ginger, Zingiber officinalis, (GINGER ROOT) 550 MG CAPS, Take 550 mg by mouth daily. , Disp: , Rfl:  .  glipiZIDE (GLUCOTROL XL) 10 MG 24 hr tablet, Take 10 mg by mouth 2 (two) times daily. , Disp: , Rfl:  .  levOCARNitine Fumarate 500 MG TABS, Take 500 mg by mouth daily. , Disp: , Rfl:  .  losartan-hydrochlorothiazide (HYZAAR) 100-25 MG tablet, Take 1 tablet by mouth every evening. , Disp: , Rfl:  .  metFORMIN (GLUCOPHAGE-XR) 500 MG 24 hr tablet, Take 1,000 mg by mouth 2 (two) times daily with a meal. , Disp: , Rfl:  .  metoprolol tartrate (LOPRESSOR) 25 MG tablet, Take 12.5 mg by mouth daily., Disp: , Rfl:  .  Misc Natural Products (TART CHERRY ADVANCED) CAPS, Take 1 capsule by mouth 2 (two) times daily. , Disp: , Rfl:  .  Misc. Devices MISC, Avivia Plus lancets and strips. Tests daily  DX E11.9, Disp: , Rfl:  .  multivitamin-lutein (OCUVITE-LUTEIN) CAPS capsule, Take 1 capsule by mouth daily., Disp: , Rfl:  .  Nutritional Supplements (EQ ESTROBLEND MENOPAUSE) TABS, Take 1 tablet by mouth every  evening. , Disp: , Rfl:  .  omega-3 acid ethyl esters (LOVAZA) 1 g capsule, Take 1 g by mouth daily., Disp: , Rfl:  .  pregabalin (LYRICA) 50 MG capsule, Take 50 mg by mouth 2 (two) times daily. , Disp: , Rfl:  .  pyridostigmine (MESTINON) 60 MG tablet, Take 1 tablet (60 mg total) by mouth 4 (four) times daily., Disp: 120 tablet, Rfl: 3 .  Rosuvastatin Calcium 10 MG CPSP, Take 5 mg by mouth at bedtime. , Disp: , Rfl:  .  sitaGLIPtin (JANUVIA) 100 MG tablet, Take 100 mg by mouth daily. , Disp: , Rfl:  .  TRULICITY 1.5 KK/9.3GH SOPN, Inject 1.5 mLs into the skin once a week., Disp: , Rfl:  .  Turmeric Curcumin 500 MG CAPS, Take 500 mg by mouth every evening. , Disp: , Rfl:  .  VITAMIN D PO, Take by mouth., Disp: , Rfl:       Objective:   Vitals:   01/13/20 1603  BP: 120/70  Pulse: 80  Temp: (!) 97.4 F (36.3 C)  TempSrc: Oral  SpO2: 97%  Weight: 196 lb 12.8 oz (89.3 kg)  Height: 5\' 4"  (1.626 m)    Estimated body mass index is 33.78 kg/m as calculated from the following:   Height as of this encounter: 5\' 4"  (1.626 m).   Weight as of this encounter: 196 lb 12.8 oz (89.3 kg).  @WEIGHTCHANGE @  [employer]   01/13/20 1603  Weight: 196 lb 12.8 oz (89.3 kg)     Physical Exam   General Appearance:    Alert, cooperative, no distress, appears stated age - yes , Deconditioned looking - yes , OBESE  - yes, Sitting on Wheelchair -  no  Head:    Normocephalic, without obvious abnormality, atraumatic  Eyes:    PERRL, conjunctiva/corneas clear, LEFT PTOSIS > RIGHT PTOSIS  Ears:    Normal TM's and external ear canals, both ears  Nose:   Nares normal, septum midline, mucosa normal, no drainage    or sinus tenderness. OXYGEN ON  - no . Patient is @ no   Throat:  Lips, mucosa, and tongue normal; teeth and gums normal. Cyanosis on lips - no  Neck:   Supple, symmetrical, trachea midline, no adenopathy;    thyroid:  no enlargement/tenderness/nodules; no carotid   bruit or JVD  Back:      Symmetric, no curvature, ROM normal, no CVA tenderness  Lungs:     Distress - no , Wheeze no, Barrell Chest - no, Purse lip breathing - o, Crackles - no   Chest Wall:    No tenderness or deformity.    Heart:    Regular rate and rhythm, S1 and S2 normal, no rub   or gallop, Murmur - no  Breast Exam:    NOT DONE  Abdomen:     Soft, non-tender, bowel sounds active all four quadrants,    no masses, no organomegaly. Visceral obesity - yes  Genitalia:   NOT DONE  Rectal:   NOT DONE  Extremities:   Extremities - normal, Has Cane - no, Clubbing - no, Edema - no  Pulses:   2+ and symmetric all extremities  Skin:   Stigmata of Connective Tissue Disease - no  Lymph nodes:   Cervical, supraclavicular, and axillary nodes normal  Psychiatric:  Neurologic:   Pleasant - yes, Anxious - n, Flat affect - no  CAm-ICU - neg, Alert and Oriented x 3 - yes, Moves all 4s - yes, Speech - normal, Cognition - intact           Assessment:       ICD-10-CM   1. Interstitial pulmonary disease (HCC)  J84.9 Pulmonary function test    6 minute walk  2. Positive anti-CCP test  R76.8 Pulmonary function test    6 minute walk  3. Myasthenia gravis ([place])  G70.00 Pulmonary function test    6 minute walk  4. Flu vaccine need  Z23 Pulmonary function test    6 minute walk       IPAF DIAGNOSTIC CRITERIA   Classification criteria for "interstitial pneumonia with autoimmune features (IPAF)" Presence of an interstitial pneumonia (by HRCT or surgical lung biopsy) and, Exclusion of alternative etiologies and, Does not meet criteria of a defined connective tissue disease and, At least one feature from at least two of these domains: - Clinical domain - Serologic domain - Morphologic domain  A. Clinical domain - Distal digital fissuring (ie, "mechanic hands") -Distal digital tip ulceration - Inflammatory arthritis or polyarticular morning joint stiffness ?60 minutes - Palmar telangiectasia - Raynaud phenomenon -  Unexplained digital edema - Unexplained fixed rash on the digital extensor surfaces (Gottron's sign)  B. Serologic domain - ANA ?1:320 titer, diffuse, speckled, homogeneous patterns or ANA nucleolar pattern (any titer) or ANA centromere pattern (any titer)  -Rheumatoid factor ?2 upper limit of normal -Anti-CCP -Anti-dsDNA -Anti-Ro (SS-A) -Anti-La (SS-B) Anti-ribonucleoprotein -Anti-Smith -Anti-topoisomerase (Scl-70) -Anti-tRNA synthetase (eg, Jo-1, PL-7, PL-12; others are: EJ, OJ, KS, Zo, tRS) -Anti-PM-Scl - Anti-MDA-5  C. Morphologic domain Suggestive radiology patterns by HRCT NSIP OP NSIP with OP overlap LIP Histopathology patterns or features by surgical lung biopsy: NSIP OP NSIP with OP overlap LIP Interstitial lymphoid aggregates with germinal centers Diffuse lymphoplasmacytic infiltration (with or without lymphoid follicles) Multi-compartment involvement (in addition to interstitial pneumonia): Unexplained pleural effusion or thickening Unexplained pericardial effusion or thickening Unexplained intrinsic airways disease* (by PFT, imaging or pathology) Unexplained pulmonary vasculopathy   Plan:     Patient Instructions     ICD-10-CM   1. 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 Myasthenia gravis ([place])  G70.00   4. 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  Right now we  are concluding that this antibody is contributing to pulmonary fibrosis  Nevertheless the pulmonary fibrosis itself is stable  Plan -High-dose flu shot today  -Given the fact I am suspecting IPAF and the fact your myasthenia gravis that is quite active -> I do not recommend biopsy of the lung anymore  -I recommend watchful waiting and careful monitoring  -I do not recommend antifibrotic's because your pulmonary fibrosis is stable and the drugs can cause nausea which he already having because of the myasthenia drugs   -Please address your myasthenia gravis with neurology [I will send him a phone note currently]  - continue cpap   -respect desire not to have ono test on RA  Follow-up -Do spirometry and DLCO in 4-6 months -Return to see Dr. Chase Caller 30-minute slot in 4-6 months  -ILD symptom questionnaires and simple walk test at follow-up    ( Level 05 visit: Estb 40-54 min  in  visit type: on-site physical face to visit  in total care time and counseling or/and coordination of care by this undersigned MD - Dr Brand Males. This includes one or more of the following on this same day 01/13/2020: pre-charting, chart review, note writing, documentation discussion of test results, diagnostic or treatment recommendations, prognosis, risks and benefits of management options, instructions, education, compliance or risk-factor reduction. It excludes time spent by the [facility] or office staff in the care of the patient. Actual time 40 min)   SIGNATURE    Dr. Brand Males, M.D., F.C.C.P,  Pulmonary and Critical Care Medicine Staff Physician, [place] Director - Interstitial Lung Disease  Program  Pulmonary [place] [facility] [place], [place], [address]  Pager: [phone], If no answer or between  15:00h - 7:00h: call [phone] Telephone: [phone]  4:57 PM 01/13/2020

## 2020-01-14 NOTE — Telephone Encounter (Signed)
Thanks and I will close message

## 2020-01-19 DIAGNOSIS — G4730 Sleep apnea, unspecified: Secondary | ICD-10-CM | POA: Diagnosis not present

## 2020-01-19 DIAGNOSIS — E1165 Type 2 diabetes mellitus with hyperglycemia: Secondary | ICD-10-CM | POA: Diagnosis not present

## 2020-01-19 DIAGNOSIS — I10 Essential (primary) hypertension: Secondary | ICD-10-CM | POA: Diagnosis not present

## 2020-01-19 DIAGNOSIS — Z6833 Body mass index (BMI) 33.0-33.9, adult: Secondary | ICD-10-CM | POA: Diagnosis not present

## 2020-01-19 DIAGNOSIS — G7000 Myasthenia gravis without (acute) exacerbation: Secondary | ICD-10-CM | POA: Diagnosis not present

## 2020-01-19 DIAGNOSIS — I779 Disorder of arteries and arterioles, unspecified: Secondary | ICD-10-CM | POA: Diagnosis not present

## 2020-01-19 DIAGNOSIS — Z299 Encounter for prophylactic measures, unspecified: Secondary | ICD-10-CM | POA: Diagnosis not present

## 2020-01-20 DIAGNOSIS — Z1231 Encounter for screening mammogram for malignant neoplasm of breast: Secondary | ICD-10-CM | POA: Diagnosis not present

## 2020-01-20 DIAGNOSIS — Z124 Encounter for screening for malignant neoplasm of cervix: Secondary | ICD-10-CM | POA: Diagnosis not present

## 2020-01-20 DIAGNOSIS — Z6835 Body mass index (BMI) 35.0-35.9, adult: Secondary | ICD-10-CM | POA: Diagnosis not present

## 2020-01-24 NOTE — Telephone Encounter (Signed)
When I saw her last, she reported improvement of ptosis and wished to continue current dosage of mestinon due to improvement in concern of increasing dosage due to nausea.  She was hesitant on use of prednisone due to diabetes.  Not sure if there is a different route to take in her case for medication management to further help with her symptoms.

## 2020-01-26 NOTE — Telephone Encounter (Signed)
We can consider changing to Mestinon XR which is slow release and should not cause as much nausea

## 2020-01-27 DIAGNOSIS — I10 Essential (primary) hypertension: Secondary | ICD-10-CM | POA: Diagnosis not present

## 2020-01-27 DIAGNOSIS — E1165 Type 2 diabetes mellitus with hyperglycemia: Secondary | ICD-10-CM | POA: Diagnosis not present

## 2020-01-27 NOTE — Telephone Encounter (Signed)
Attempted to call patient in regards to ongoing use of Mestinon and potentially changing IR formulation to XR formulation per Dr. Clydene Fake recommendations.  Voicemail left requesting callback as she did not answer.

## 2020-02-01 DIAGNOSIS — E119 Type 2 diabetes mellitus without complications: Secondary | ICD-10-CM | POA: Diagnosis not present

## 2020-02-01 DIAGNOSIS — G7000 Myasthenia gravis without (acute) exacerbation: Secondary | ICD-10-CM | POA: Diagnosis not present

## 2020-02-01 DIAGNOSIS — H04123 Dry eye syndrome of bilateral lacrimal glands: Secondary | ICD-10-CM | POA: Diagnosis not present

## 2020-02-01 DIAGNOSIS — H02402 Unspecified ptosis of left eyelid: Secondary | ICD-10-CM | POA: Diagnosis not present

## 2020-02-08 DIAGNOSIS — D1801 Hemangioma of skin and subcutaneous tissue: Secondary | ICD-10-CM | POA: Diagnosis not present

## 2020-02-08 DIAGNOSIS — I8312 Varicose veins of left lower extremity with inflammation: Secondary | ICD-10-CM | POA: Diagnosis not present

## 2020-02-08 DIAGNOSIS — L814 Other melanin hyperpigmentation: Secondary | ICD-10-CM | POA: Diagnosis not present

## 2020-02-08 DIAGNOSIS — L821 Other seborrheic keratosis: Secondary | ICD-10-CM | POA: Diagnosis not present

## 2020-02-08 DIAGNOSIS — I872 Venous insufficiency (chronic) (peripheral): Secondary | ICD-10-CM | POA: Diagnosis not present

## 2020-02-08 DIAGNOSIS — L570 Actinic keratosis: Secondary | ICD-10-CM | POA: Diagnosis not present

## 2020-02-08 DIAGNOSIS — I8311 Varicose veins of right lower extremity with inflammation: Secondary | ICD-10-CM | POA: Diagnosis not present

## 2020-02-26 DIAGNOSIS — I10 Essential (primary) hypertension: Secondary | ICD-10-CM | POA: Diagnosis not present

## 2020-03-07 DIAGNOSIS — J029 Acute pharyngitis, unspecified: Secondary | ICD-10-CM | POA: Diagnosis not present

## 2020-03-07 DIAGNOSIS — R0981 Nasal congestion: Secondary | ICD-10-CM | POA: Diagnosis not present

## 2020-03-07 DIAGNOSIS — J0100 Acute maxillary sinusitis, unspecified: Secondary | ICD-10-CM | POA: Diagnosis not present

## 2020-03-09 DIAGNOSIS — M19072 Primary osteoarthritis, left ankle and foot: Secondary | ICD-10-CM | POA: Diagnosis not present

## 2020-03-09 DIAGNOSIS — M76821 Posterior tibial tendinitis, right leg: Secondary | ICD-10-CM | POA: Diagnosis not present

## 2020-03-09 DIAGNOSIS — M25572 Pain in left ankle and joints of left foot: Secondary | ICD-10-CM | POA: Diagnosis not present

## 2020-03-17 DIAGNOSIS — H2512 Age-related nuclear cataract, left eye: Secondary | ICD-10-CM | POA: Diagnosis not present

## 2020-03-17 DIAGNOSIS — H2511 Age-related nuclear cataract, right eye: Secondary | ICD-10-CM | POA: Diagnosis not present

## 2020-03-20 DIAGNOSIS — I6529 Occlusion and stenosis of unspecified carotid artery: Secondary | ICD-10-CM | POA: Diagnosis not present

## 2020-03-20 DIAGNOSIS — I340 Nonrheumatic mitral (valve) insufficiency: Secondary | ICD-10-CM | POA: Diagnosis not present

## 2020-03-20 DIAGNOSIS — I517 Cardiomegaly: Secondary | ICD-10-CM | POA: Diagnosis not present

## 2020-03-20 DIAGNOSIS — I739 Peripheral vascular disease, unspecified: Secondary | ICD-10-CM | POA: Diagnosis not present

## 2020-03-28 ENCOUNTER — Ambulatory Visit: Payer: PPO | Admitting: Neurology

## 2020-03-28 ENCOUNTER — Encounter: Payer: Self-pay | Admitting: Neurology

## 2020-03-28 VITALS — BP 126/73 | HR 76 | Ht 64.0 in | Wt 189.4 lb

## 2020-03-28 DIAGNOSIS — G7000 Myasthenia gravis without (acute) exacerbation: Secondary | ICD-10-CM | POA: Diagnosis not present

## 2020-03-28 DIAGNOSIS — I10 Essential (primary) hypertension: Secondary | ICD-10-CM | POA: Diagnosis not present

## 2020-03-28 MED ORDER — PYRIDOSTIGMINE BROMIDE 60 MG PO TABS
60.0000 mg | ORAL_TABLET | Freq: Three times a day (TID) | ORAL | 3 refills | Status: DC
Start: 2020-03-28 — End: 2020-07-20

## 2020-03-28 NOTE — Patient Instructions (Signed)
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  I recommend she change Mestinon to 3 times daily and see if her symptoms are still controlled and side effects improved.  If this is not effective may consider adding prednisone which she seems reluctant in the present time.  May consider referral to Dr. Krista Blue for second opinion.  She will return for follow-up in 3 months with my nurse practitioner Janett Billow or call earlier if necessary.

## 2020-03-28 NOTE — Progress Notes (Signed)
[facility]  PATIENT: Crystal EWTON DOB: 07-17-1945   REASON FOR VISIT: Follow-up for ocular myasthenia HISTORY FROM: Patient    HISTORY OF PRESENT ILLNESS: Initial Consult  03/07/16 PS;  [name] is [name] whose had droopy eyelids first started in December 2016 when she stated states that "I was drooping and she will barely open it. She was seen by primary physician who thought she had partial Bell's palsy. This did not get better and subsequently in June she saw ophthalmologist who noticed that she had some infection on the lower eyelid treated with antibiotics. Since then she started that she has intermittent opening of both eyelids left more than right. There are times when arises is completely shut and on other times she can see well. In fact on inquiry she states that a couple of occasion while driving she was seeing double the dividing lines of the road for crossing over. She is also noticed that she gets fatigued and tired very easily. Initially when this first began this was thought to be related to viral infection but this does not seem to be getting better. She denies any difficulty moving her eyes, any dysphagia, weakness in her arms or legs. She has no trouble getting out, chair or climbing up and down steps. She has a good appetite and she had not lost any weight. Review of electronic medical record shows that she had elevated acetylcholine receptor antibodies on 01/15/16 with acetylcholine binding antibody of 6.16, blocking antibody 31 and modulating antibody 48 all of which are abnormal. Patient has not been tried on Mestinon on other medications for myasthenia. She denies any history of headaches, strokes, TIAs or other neurologic problems. Update 1/16/2018PS : She returns for follow-up after last visit 2 months ago. She states she has noticed benefit on Mestinon and eyes do not droop as much and she is not had any episodes of diplopia.  She doesn't feel tired as much as well. However she was unable to tolerate 60 mg dose of Mestinon due to stomach cramps, nausea and is taking only 30 mg mostly 3 times a day and takes an occasional fourth dose if needed. She did undergo nerve conduction study with rapid repetitive stimulation on 04/10/16 which was negative for decremental response. The patient is reluctant to consider prednisone given her diabetes and risk of blood sugars being elevated on it UPDATE 04/19/2018CM [name], 74 year old female returns for follow-up with history of ocular myasthenia. She is currently on Mestinon sometimes taking a full pill but mostly half pills due to nausea. She is a diabetic so she does not want to take prednisone. She has had no double vision blurred vision or any drooping of her left eye. Vision today 20/50 right 20/40 left.She denies any difficulty moving her eyes, any dysphagia, weakness in her arms or legs. She has no trouble getting out, chair or climbing up and down steps.  She continues to be active. She returns for reevaluation. She is requesting something for nausea UPDATE 10/17/2018CM  [name], 74 year old female returns for follow-up with a history of ocular myasthenia with symptoms beginning in June 2016. She denies any episodes of diplopia, no ptosis seen today. Vision today 2070 bilaterally. She denies any weakness in her arms or legs or any problems with breathing she continues to be quite active. She continues to drive without difficulty she had 1 recent fall no injury.  She had a recent visit to her ophthalmologist.  She is currently on Mestinon sometimes takes a full pill and sometimes a half pill. She has nausea but  Zofran did not help, she returns for reevaluation. Recently diagnosed with arthritis. Update 08/19/2017 :she returns for follow-up after last visit 6 months ago. She states her cholesterol myasthenia symptoms are well controlled. She denies any drooping of the eyelids, diplopia,  muscle fatigue or weakness. She states she is tired all the time but this may be related to sleep apnea and lack of exercise. She is currently on Mestinon 30 mg 4 times daily and is tolerating it quite well. She was unable to tolerate 60 mg due to diarrhea and upset stomach. She has no new complaints today. She does complain of some intermittent trouble swallowing when she thinks she swallows too quickly at times gets stuck in the middle of the chest. She denies any nasal regurg, nasal twang to her speech heart food being stuck in the back of her mouth.She states her blood pressure and sugar control have been quite good. 10/25/2019 : She returns for follow-up after last visit 2 years ago.  She states she was doing fine with her ptosis and myasthenia until 2 weeks ago when she is noticed increased droopiness of the left eye particularly towards the afternoon.  In the mornings her eyes seem much more open.  She called Dr. Jannifer Franklin who recommended increasing her Mestinon which she was taking 30 mg 4 times daily to 60 mg 3 times daily.  She has not noticed any specific benefit.  She does have mild diarrhea and nausea but is tolerating it otherwise well.  Patient in the past has not been able to tolerate higher doses of Mestinon.  Dr. Jimmye Norman did discuss adding prednisone but the patient is reluctant to try this because of her diabetes and worsening sugars.  Patient denies any weakness in her extremities, significant fatigue or tiredness.  She denies any recent respiratory illness in the form of infection.  She did lose her balance 1 she tripped but did not hurt herself.  She does have some intermittent diplopia particularly with sustained upgaze.    Update 12/21/2019 JM: [name] returns today for 69-month follow-up regarding ocular myasthenia  After prior visit, complaints of worsening left eye drooping Dr. Leonie Man recommended increasing Mestinon dosage to 60 mg 4 times daily Reports improvement of left eye symptoms  but continues to have some droopiness especially when outdoors or in bright light and diplopia if both eyes are open Side effects of diarrhea and nausea with benefit with use of Imodium and a " nausea medication" that PCP started her on -she is unable to state name She is frustrated regarding continued symptoms and she has not been able to return to driving Denies any extremity weakness, fatigue or imbalance  She was reluctantly placed on prednisone in July due to severe back and hip pain Reports benefit of left eye symptoms and did not notice impact on her glucose levels  No further concerns   Update 03/28/2020 : She returns for follow-up after last visit with [facility] practitioner 3 months ago.  She continues to have mild drooping of the left eye which is intermittent and more pronounced when she is tired and exhausted or excited.  She is taking Mestinon 60 mg 4 times daily but is having some trouble with diarrhea and upset stomach and occasional cramps particularly during the early afternoon.  She has refused to take prednisone because of the long-term side effects.  She  had a flareup of her back pain in August and was treated with a short course of prednisone which she feels transiently helped her myasthenic symptoms.  She also had a flareup of her sinus infection in October and took another short course of prednisone.  She plans to have cataract surgery soon.  She has given up driving because of fear of her eye closure.    REVIEW OF SYSTEMS: Full 14 system review of systems performed and notable only for those listed, all others are neg:  Intermittent diplopia, drooping of the left eyelid only. All systems negative  ALLERGIES: Allergies  Allergen Reactions  . Celebrex [Celecoxib] Hives and Swelling    SWELLING REACTION UNSPECIFIED   . Vioxx [Rofecoxib] Hives  . Latex Rash    HOME MEDICATIONS: Outpatient Medications Prior to Visit  Medication Sig Dispense Refill  . ACCU-CHEK  AVIVA PLUS test strip Apply 1 strip topically daily. for testing  1  . ACCU-CHEK SOFTCLIX LANCETS lancets TEST DAILY E11.9  1  . APPLE CIDER VINEGAR PO Take 1 tablet by mouth daily.     . Biotin w/ Vitamins C & E (HAIR/SKIN/NAILS PO) Take 1 tablet by mouth daily.    . Calcium Carb-Cholecalciferol (CALCIUM+D3) 600-800 MG-UNIT TABS Take 1 tablet by mouth daily.    . cetirizine (ZYRTEC) 10 MG tablet Take 10 mg by mouth at bedtime.   0  . Co-Enzyme Q-10 100 MG CAPS Take 100 mg by mouth daily.     . diclofenac sodium (VOLTAREN) 1 % GEL Apply 1 application topically 3 (three) times daily as needed (arthritis).     Marland Kitchen empagliflozin (JARDIANCE) 25 MG TABS tablet Jardiance 25 mg tablet    . fluticasone (FLONASE) 50 MCG/ACT nasal spray Place 2 sprays into both nostrils at bedtime.     . gabapentin (NEURONTIN) 300 MG capsule Take 300 mg by mouth at bedtime.     . Ginger, Zingiber officinalis, (GINGER ROOT) 550 MG CAPS Take 550 mg by mouth daily.     Marland Kitchen glipiZIDE (GLUCOTROL XL) 10 MG 24 hr tablet Take 10 mg by mouth 2 (two) times daily.     Marland Kitchen levOCARNitine Fumarate 500 MG TABS Take 500 mg by mouth daily.     Marland Kitchen losartan-hydrochlorothiazide (HYZAAR) 100-25 MG tablet Take 1 tablet by mouth every evening.     . metFORMIN (GLUCOPHAGE-XR) 500 MG 24 hr tablet Take 1,000 mg by mouth 2 (two) times daily with a meal.     . metoprolol tartrate (LOPRESSOR) 25 MG tablet Take 12.5 mg by mouth daily.    . Misc Natural Products (TART CHERRY ADVANCED) CAPS Take 1 capsule by mouth 2 (two) times daily.     . Misc. Devices MISC Avivia Plus lancets and strips. Tests daily  DX E11.9    . multivitamin-lutein (OCUVITE-LUTEIN) CAPS capsule Take 1 capsule by mouth daily.    . Nutritional Supplements (EQ ESTROBLEND MENOPAUSE) TABS Take 1 tablet by mouth every evening.     Marland Kitchen omega-3 acid ethyl esters (LOVAZA) 1 g capsule Take 1 g by mouth daily.    . pregabalin (LYRICA) 50 MG capsule Take 50 mg by mouth 2 (two) times daily.     .  Rosuvastatin Calcium 10 MG CPSP Take 5 mg by mouth at bedtime.     . sitaGLIPtin (JANUVIA) 100 MG tablet Take 100 mg by mouth daily.     . TRULICITY 1.5 HG/9.9ME SOPN Inject 1.5 mLs into the skin once a week.    Marland Kitchen  Turmeric Curcumin 500 MG CAPS Take 500 mg by mouth every evening.     Marland Kitchen VITAMIN D PO Take by mouth.    . pyridostigmine (MESTINON) 60 MG tablet Take 1 tablet (60 mg total) by mouth 4 (four) times daily. 120 tablet 3   No facility-administered medications prior to visit.    PAST MEDICAL HISTORY: Past Medical History:  Diagnosis Date  . Arthritis   . Bursitis   . Diabetes mellitus without complication ([place])   . Dyspnea   . Fibromyalgia   . GERD (gastroesophageal reflux disease)   . Hyperlipemia   . Hypertension   . 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 Sinusitis   . Sleep apnea    uses a cpap  . Sleep apnea   . Wears glasses     PAST SURGICAL HISTORY: Past Surgical History:  Procedure Laterality Date  . ABDOMINAL HYSTERECTOMY  1999  . BREAST SURGERY  2002   rt br bx  . CERVICAL FUSION  2007  . CHOLECYSTECTOMY  2005   lap choli  . COLONOSCOPY    . FINGER GANGLION CYST EXCISION    . INCISION AND DRAINAGE ABSCESS     Staph infection with multiple abdominal wounds s/p I&D in [place] ~ 2014  . SHOULDER ARTHROSCOPY WITH OPEN ROTATOR CUFF REPAIR AND DISTAL CLAVICLE ACROMINECTOMY Right 09/09/2012   Procedure: Right Shoulder Arthroscopy with distal clavicle excision and mini-open rotator cuff repair.;  Surgeon: Alta Corning, MD;  Location: [facility];  Service: Orthopedics;  Laterality: Right;  . TONSILLECTOMY      FAMILY HISTORY: Family History  Problem Relation Age of Onset  . Emphysema Father   . Intracerebral hemorrhage Sister   . Myasthenia gravis Neg Hx        none that she knows of     SOCIAL HISTORY: Social History   Socioeconomic History  . Marital status: Married    Spouse name: Not on file  . Number of children: Not on file  . Years  of education: Not on file  . Highest education level: Not on file  Occupational History  . Occupation: Retired  Tobacco Use  . Smoking status: Never Smoker  . Smokeless tobacco: Never Used  Vaping Use  . Vaping Use: Never used  Substance and Sexual Activity  . Alcohol use: No  . Drug use: No  . Sexual activity: Not on file  Other Topics Concern  . Not on file  Social History Narrative   Lives at home with husband   Right handed   Caffeine: maybe 1 cup/day   Social Determinants of Health   Financial Resource Strain:   . Difficulty of Paying Living Expenses: Not on file  Food Insecurity:   . Worried About Charity fundraiser in the Last Year: Not on file  . Ran Out of Food in the Last Year: Not on file  Transportation Needs:   . Lack of Transportation (Medical): Not on file  . Lack of Transportation (Non-Medical): Not on file  Physical Activity:   . Days of Exercise per Week: Not on file  . Minutes of Exercise per Session: Not on file  Stress:   . Feeling of Stress : Not on file  Social Connections:   . Frequency of Communication with Friends and Family: Not on file  . Frequency of Social Gatherings with Friends and Family: Not on file  . Attends Religious Services: Not on file  . Active Member  of Clubs or Organizations: Not on file  . Attends Archivist Meetings: Not on file  . Marital Status: Not on file  Intimate Partner Violence:   . Fear of Current or Ex-Partner: Not on file  . Emotionally Abused: Not on file  . Physically Abused: Not on file  . Sexually Abused: Not on file     PHYSICAL EXAM  Vitals:   03/28/20 1318  BP: 126/73  Pulse: 76  Weight: 85.9 kg  Height: 5\' 4"  (1.626 m)   Body mass index is 32.51 kg/m.  Generalized: Pleasant elderly Caucasian lady,Obese female in no acute distress  Head: normocephalic and atraumatic,.   Neck: Supple, no carotid bruits  Cardiac: Regular rate rhythm, no murmur  Musculoskeletal: No deformity    Neurological examination   Mentation: Alert oriented to time, place, history taking. Attention span and concentration appropriate. Recent and remote memory intact.  Follows all commands speech and language fluent.  Cranial nerve II-XII: Pupils were equal round reactive to light extraocular movements were full, no nystagmus, left eye mild drooping at rest which increases with sustained upgaze for 1 minute.  Transient drooping of the right eyelid with sustained upgaze for 1 minute..  No diplopia and ophthalmoplegia.. Facial sensation and strength were normal. hearing was intact to finger rubbing bilaterally. Uvula tongue midline. head turning and shoulder shrug were normal and symmetric.Tongue protrusion into cheek strength was normal. Motor: normal bulk and tone, full strength in the BUE, BLE, except decreased weakness distally right  lower extremity,  fine finger movements normal Sensory: normal and symmetric to light touch, pinprick, and  Vibration, in the upper and lower extremities  Coordination: finger-nose-finger, heel-to-shin bilaterally, no dysmetria Reflexes: 1+ upper lower and symmetric, plantar responses were flexor bilaterally. Gait and Station: Rising up from seated position without assistance, normal stance,  moderate stride, good arm swing, smooth turning. Ambulates without assistive device     ASSESSMENT AND PLAN 60 year Caucasian lady with gradual onset drooping of eyelids in December 2016 due to ocular myasthenia. She has positive acetylcholine receptor antibodies. She has some GI intolerance to Mestinon but otherwise eye drooping and diplopia are mostly stable..    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  I recommend she change Mestinon to 3 times daily and see if her symptoms are still controlled and side effects improved.  If this is not  effective may consider adding prednisone which she seems reluctant in the present time.  May consider referral to Dr. Krista Blue for second opinion.  She will return for follow-up in 3 months with my nurse practitioner Janett Billow or call earlier if necessary. I spent 25 minutes of face-to-face and non-face-to-face time with patient.  This included previsit chart review, lab review, study review, order entry, electronic health record documentation, patient education regarding ocular myasthenia, use of Mestinon and current side effects, further treatment options if indicated and answered all questions to patient satisfaction  Antony Contras, MD  [facility] [address] [place], [place] [address]  Phone [phone] Fax [phone] Note: This document was prepared with digital dictation and possible smart phrase technology. Any transcriptional errors that result from this process are unintentional.

## 2020-03-30 DIAGNOSIS — H2511 Age-related nuclear cataract, right eye: Secondary | ICD-10-CM | POA: Diagnosis not present

## 2020-04-04 DIAGNOSIS — R0600 Dyspnea, unspecified: Secondary | ICD-10-CM | POA: Diagnosis not present

## 2020-04-04 DIAGNOSIS — I340 Nonrheumatic mitral (valve) insufficiency: Secondary | ICD-10-CM | POA: Diagnosis not present

## 2020-04-04 DIAGNOSIS — I739 Peripheral vascular disease, unspecified: Secondary | ICD-10-CM | POA: Diagnosis not present

## 2020-04-04 DIAGNOSIS — I6529 Occlusion and stenosis of unspecified carotid artery: Secondary | ICD-10-CM | POA: Diagnosis not present

## 2020-04-19 DIAGNOSIS — H2512 Age-related nuclear cataract, left eye: Secondary | ICD-10-CM | POA: Diagnosis not present

## 2020-04-27 DIAGNOSIS — I10 Essential (primary) hypertension: Secondary | ICD-10-CM | POA: Diagnosis not present

## 2020-04-27 DIAGNOSIS — E7800 Pure hypercholesterolemia, unspecified: Secondary | ICD-10-CM | POA: Diagnosis not present

## 2020-04-27 DIAGNOSIS — E1165 Type 2 diabetes mellitus with hyperglycemia: Secondary | ICD-10-CM | POA: Diagnosis not present

## 2020-04-27 DIAGNOSIS — I779 Disorder of arteries and arterioles, unspecified: Secondary | ICD-10-CM | POA: Diagnosis not present

## 2020-04-27 DIAGNOSIS — Z299 Encounter for prophylactic measures, unspecified: Secondary | ICD-10-CM | POA: Diagnosis not present

## 2020-04-28 DIAGNOSIS — E7849 Other hyperlipidemia: Secondary | ICD-10-CM | POA: Diagnosis not present

## 2020-04-28 DIAGNOSIS — I10 Essential (primary) hypertension: Secondary | ICD-10-CM | POA: Diagnosis not present

## 2020-04-28 DIAGNOSIS — E1165 Type 2 diabetes mellitus with hyperglycemia: Secondary | ICD-10-CM | POA: Diagnosis not present

## 2020-05-11 DIAGNOSIS — I10 Essential (primary) hypertension: Secondary | ICD-10-CM | POA: Diagnosis not present

## 2020-05-11 DIAGNOSIS — R9431 Abnormal electrocardiogram [ECG] [EKG]: Secondary | ICD-10-CM | POA: Diagnosis not present

## 2020-05-11 DIAGNOSIS — R0600 Dyspnea, unspecified: Secondary | ICD-10-CM | POA: Diagnosis not present

## 2020-05-11 DIAGNOSIS — I119 Hypertensive heart disease without heart failure: Secondary | ICD-10-CM | POA: Diagnosis not present

## 2020-05-26 DIAGNOSIS — R0600 Dyspnea, unspecified: Secondary | ICD-10-CM | POA: Diagnosis not present

## 2020-05-29 DIAGNOSIS — I10 Essential (primary) hypertension: Secondary | ICD-10-CM | POA: Diagnosis not present

## 2020-05-29 DIAGNOSIS — E1165 Type 2 diabetes mellitus with hyperglycemia: Secondary | ICD-10-CM | POA: Diagnosis not present

## 2020-06-08 DIAGNOSIS — B351 Tinea unguium: Secondary | ICD-10-CM | POA: Diagnosis not present

## 2020-06-08 DIAGNOSIS — M79676 Pain in unspecified toe(s): Secondary | ICD-10-CM | POA: Diagnosis not present

## 2020-06-26 DIAGNOSIS — E1165 Type 2 diabetes mellitus with hyperglycemia: Secondary | ICD-10-CM | POA: Diagnosis not present

## 2020-06-26 DIAGNOSIS — I10 Essential (primary) hypertension: Secondary | ICD-10-CM | POA: Diagnosis not present

## 2020-06-26 DIAGNOSIS — E7800 Pure hypercholesterolemia, unspecified: Secondary | ICD-10-CM | POA: Diagnosis not present

## 2020-07-20 ENCOUNTER — Other Ambulatory Visit: Payer: Self-pay

## 2020-07-20 ENCOUNTER — Ambulatory Visit: Payer: PPO | Admitting: Adult Health

## 2020-07-20 ENCOUNTER — Encounter: Payer: Self-pay | Admitting: Adult Health

## 2020-07-20 VITALS — BP 115/56 | HR 68 | Ht 63.0 in | Wt 176.0 lb

## 2020-07-20 DIAGNOSIS — G7000 Myasthenia gravis without (acute) exacerbation: Secondary | ICD-10-CM

## 2020-07-20 MED ORDER — PYRIDOSTIGMINE BROMIDE 60 MG PO TABS
60.0000 mg | ORAL_TABLET | Freq: Three times a day (TID) | ORAL | 5 refills | Status: DC
Start: 2020-07-20 — End: 2021-05-30

## 2020-07-20 NOTE — Progress Notes (Signed)
[facility]  PATIENT: [name] DOB: 07-17-1945   REASON FOR VISIT: Follow-up for ocular myasthenia HISTORY FROM: Patient    HISTORY OF PRESENT ILLNESS: Initial Consult  03/07/16 PS;  [name] is [name] whose had droopy eyelids first started in December 2016 when she stated states that "I was drooping and she will barely open it. She was seen by primary physician who thought she had partial Bell's palsy. This did not get better and subsequently in June she saw ophthalmologist who noticed that she had some infection on the lower eyelid treated with antibiotics. Since then she started that she has intermittent opening of both eyelids left more than right. There are times when arises is completely shut and on other times she can see well. In fact on inquiry she states that a couple of occasion while driving she was seeing double the dividing lines of the road for crossing over. She is also noticed that she gets fatigued and tired very easily. Initially when this first began this was thought to be related to viral infection but this does not seem to be getting better. She denies any difficulty moving her eyes, any dysphagia, weakness in her arms or legs. She has no trouble getting out, chair or climbing up and down steps. She has a good appetite and she had not lost any weight. Review of electronic medical record shows that she had elevated acetylcholine receptor antibodies on 01/15/16 with acetylcholine binding antibody of 6.16, blocking antibody 31 and modulating antibody 48 all of which are abnormal. Patient has not been tried on Mestinon on other medications for myasthenia. She denies any history of headaches, strokes, TIAs or other neurologic problems. Update 1/16/2018PS : She returns for follow-up after last visit 2 months ago. She states she has noticed benefit on Mestinon and eyes do not droop as much and she is not had any episodes of diplopia.  She doesn't feel tired as much as well. However she was unable to tolerate 60 mg dose of Mestinon due to stomach cramps, nausea and is taking only 30 mg mostly 3 times a day and takes an occasional fourth dose if needed. She did undergo nerve conduction study with rapid repetitive stimulation on 04/10/16 which was negative for decremental response. The patient is reluctant to consider prednisone given her diabetes and risk of blood sugars being elevated on it UPDATE 04/19/2018CM [name], 75 year old female returns for follow-up with history of ocular myasthenia. She is currently on Mestinon sometimes taking a full pill but mostly half pills due to nausea. She is a diabetic so she does not want to take prednisone. She has had no double vision blurred vision or any drooping of her left eye. Vision today 20/50 right 20/40 left.She denies any difficulty moving her eyes, any dysphagia, weakness in her arms or legs. She has no trouble getting out, chair or climbing up and down steps.  She continues to be active. She returns for reevaluation. She is requesting something for nausea UPDATE 10/17/2018CM  [name], 75 year old female returns for follow-up with a history of ocular myasthenia with symptoms beginning in June 2016. She denies any episodes of diplopia, no ptosis seen today. Vision today 2070 bilaterally. She denies any weakness in her arms or legs or any problems with breathing she continues to be quite active. She continues to drive without difficulty she had 1 recent fall no injury.  She had a recent visit to her ophthalmologist.  She is currently on Mestinon sometimes takes a full pill and sometimes a half pill. She has nausea but  Zofran did not help, she returns for reevaluation. Recently diagnosed with arthritis. Update 08/19/2017 :she returns for follow-up after last visit 6 months ago. She states her cholesterol myasthenia symptoms are well controlled. She denies any drooping of the eyelids, diplopia,  muscle fatigue or weakness. She states she is tired all the time but this may be related to sleep apnea and lack of exercise. She is currently on Mestinon 30 mg 4 times daily and is tolerating it quite well. She was unable to tolerate 60 mg due to diarrhea and upset stomach. She has no new complaints today. She does complain of some intermittent trouble swallowing when she thinks she swallows too quickly at times gets stuck in the middle of the chest. She denies any nasal regurg, nasal twang to her speech heart food being stuck in the back of her mouth.She states her blood pressure and sugar control have been quite good. 10/25/2019 : She returns for follow-up after last visit 2 years ago.  She states she was doing fine with her ptosis and myasthenia until 2 weeks ago when she is noticed increased droopiness of the left eye particularly towards the afternoon.  In the mornings her eyes seem much more open.  She called Dr. Jannifer Franklin who recommended increasing her Mestinon which she was taking 30 mg 4 times daily to 60 mg 3 times daily.  She has not noticed any specific benefit.  She does have mild diarrhea and nausea but is tolerating it otherwise well.  Patient in the past has not been able to tolerate higher doses of Mestinon.  Dr. Jimmye Norman did discuss adding prednisone but the patient is reluctant to try this because of her diabetes and worsening sugars.  Patient denies any weakness in her extremities, significant fatigue or tiredness.  She denies any recent respiratory illness in the form of infection.  She did lose her balance 1 she tripped but did not hurt herself.  She does have some intermittent diplopia particularly with sustained upgaze.    Update 12/21/2019 JM: [name] returns today for 37-month follow-up regarding ocular myasthenia After prior visit, complaints of worsening left eye drooping Dr. Leonie Man recommended increasing Mestinon dosage to 60 mg 4 times daily Reports improvement of left eye symptoms  but continues to have some droopiness especially when outdoors or in bright light and diplopia if both eyes are open Side effects of diarrhea and nausea with benefit with use of Imodium and a " nausea medication" that PCP started her on -she is unable to state name She is frustrated regarding continued symptoms and she has not been able to return to driving Denies any extremity weakness, fatigue or imbalance She was reluctantly placed on prednisone in July due to severe back and hip pain Reports benefit of left eye symptoms and did not notice impact on her glucose levels No further concerns  Update 03/28/2020 Dr. Leonie Man: She returns for follow-up after last visit with Janett Billow nurse practitioner 3 months ago.  She continues to have mild drooping of the left eye which is intermittent and more pronounced when she is tired and exhausted or excited.  She is taking Mestinon 60 mg 4 times daily but is having some trouble with diarrhea and upset stomach and occasional cramps particularly during the early afternoon.  She has refused to take prednisone because of the long-term side effects.  She had a flareup  of her back pain in August and was treated with a short course of prednisone which she feels transiently helped her myasthenic symptoms.  She also had a flareup of her sinus infection in October and took another short course of prednisone.  She plans to have cataract surgery soon.  She has given up driving because of fear of her eye closure.   Update 07/20/2020 JM: Returns for 4 months follow-up regarding ocular myasthenia  Continues to experience mild left eye drooping and diplopia but some improvement since prior that Currently on Mestinon 60mg  3 times daily - reports improvement of diarrhea, nausea and cramps after decreasing from 4 times to 3 times daily dosing -thankfully symptoms have not worsened but actually improved  S/p cataract 12/2 right eye; 12/22 left eye Has been more sensitive to light  since then  No new concerns at this time        REVIEW OF SYSTEMS: Full 14 system review of systems performed and notable only for those listed, all others are neg:  Intermittent diplopia, drooping of the left eyelid only. All systems negative  ALLERGIES: Allergies  Allergen Reactions  . Celebrex [Celecoxib] Hives and Swelling    SWELLING REACTION UNSPECIFIED   . Vioxx [Rofecoxib] Hives  . Latex Rash    HOME MEDICATIONS: Outpatient Medications Prior to Visit  Medication Sig Dispense Refill  . ACCU-CHEK AVIVA PLUS test strip Apply 1 strip topically daily. for testing  1  . ACCU-CHEK SOFTCLIX LANCETS lancets TEST DAILY E11.9  1  . APPLE CIDER VINEGAR PO Take 1 tablet by mouth daily.     . Biotin w/ Vitamins C & E (HAIR/SKIN/NAILS PO) Take 1 tablet by mouth daily.    . Calcium Carb-Cholecalciferol 600-800 MG-UNIT TABS Take 1 tablet by mouth daily.    . cetirizine (ZYRTEC) 10 MG tablet Take 10 mg by mouth at bedtime.   0  . Co-Enzyme Q-10 100 MG CAPS Take 100 mg by mouth daily.     . diclofenac sodium (VOLTAREN) 1 % GEL Apply 1 application topically 3 (three) times daily as needed (arthritis).     Marland Kitchen empagliflozin (JARDIANCE) 25 MG TABS tablet Jardiance 25 mg tablet    . fluticasone (FLONASE) 50 MCG/ACT nasal spray Place 2 sprays into both nostrils at bedtime.     . gabapentin (NEURONTIN) 300 MG capsule Take 300 mg by mouth at bedtime.     . Ginger, Zingiber officinalis, (GINGER ROOT) 550 MG CAPS Take 550 mg by mouth daily.     Marland Kitchen glipiZIDE (GLUCOTROL XL) 10 MG 24 hr tablet Take 10 mg by mouth 2 (two) times daily.     Marland Kitchen levOCARNitine Fumarate 500 MG TABS Take 500 mg by mouth daily.     Marland Kitchen losartan-hydrochlorothiazide (HYZAAR) 100-25 MG tablet Take 1 tablet by mouth every evening.     . metFORMIN (GLUCOPHAGE-XR) 500 MG 24 hr tablet Take 1,000 mg by mouth 2 (two) times daily with a meal.     . metoprolol tartrate (LOPRESSOR) 25 MG tablet Take 12.5 mg by mouth daily.    . Misc Natural  Products (TART CHERRY ADVANCED) CAPS Take 1 capsule by mouth 2 (two) times daily.     . Misc. Devices MISC Avivia Plus lancets and strips. Tests daily  DX E11.9    . multivitamin-lutein (OCUVITE-LUTEIN) CAPS capsule Take 1 capsule by mouth daily.    . Nutritional Supplements (EQ ESTROBLEND MENOPAUSE) TABS Take 1 tablet by mouth every evening.     Marland Kitchen  omega-3 acid ethyl esters (LOVAZA) 1 g capsule Take 1 g by mouth daily.    . pregabalin (LYRICA) 50 MG capsule Take 50 mg by mouth 2 (two) times daily.     . Rosuvastatin Calcium 10 MG CPSP Take 5 mg by mouth at bedtime.     . sitaGLIPtin (JANUVIA) 100 MG tablet Take 100 mg by mouth daily.     . TRULICITY 1.5 XH/3.7JI SOPN Inject 1.5 mLs into the skin once a week.    . Turmeric Curcumin 500 MG CAPS Take 500 mg by mouth every evening.     Marland Kitchen VITAMIN D PO Take by mouth.    . pyridostigmine (MESTINON) 60 MG tablet Take 1 tablet (60 mg total) by mouth 3 (three) times daily. 120 tablet 3   No facility-administered medications prior to visit.    PAST MEDICAL HISTORY: Past Medical History:  Diagnosis Date  . Arthritis   . Bursitis   . Diabetes mellitus without complication ([place])   . Dyspnea   . Fibromyalgia   . GERD (gastroesophageal reflux disease)   . Hyperlipemia   . Hypertension   . 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 Sinusitis   . Sleep apnea    uses a cpap  . Sleep apnea   . Wears glasses     PAST SURGICAL HISTORY: Past Surgical History:  Procedure Laterality Date  . ABDOMINAL HYSTERECTOMY  1999  . BREAST SURGERY  2002   rt br bx  . CERVICAL FUSION  2007  . CHOLECYSTECTOMY  2005   lap choli  . COLONOSCOPY    . FINGER GANGLION CYST EXCISION    . INCISION AND DRAINAGE ABSCESS     Staph infection with multiple abdominal wounds s/p I&D in [place] ~ 2014  . SHOULDER ARTHROSCOPY WITH OPEN ROTATOR CUFF REPAIR AND DISTAL CLAVICLE ACROMINECTOMY Right 09/09/2012   Procedure: Right Shoulder Arthroscopy with distal clavicle excision and  mini-open rotator cuff repair.;  Surgeon: Alta Corning, MD;  Location: [facility];  Service: Orthopedics;  Laterality: Right;  . TONSILLECTOMY      FAMILY HISTORY: Family History  Problem Relation Age of Onset  . Emphysema Father   . Intracerebral hemorrhage Sister   . Myasthenia gravis Neg Hx        none that she knows of     SOCIAL HISTORY: Social History   Socioeconomic History  . Marital status: Married    Spouse name: Not on file  . Number of children: Not on file  . Years of education: Not on file  . Highest education level: Not on file  Occupational History  . Occupation: Retired  Tobacco Use  . Smoking status: Never Smoker  . Smokeless tobacco: Never Used  Vaping Use  . Vaping Use: Never used  Substance and Sexual Activity  . Alcohol use: No  . Drug use: No  . Sexual activity: Not on file  Other Topics Concern  . Not on file  Social History Narrative   Lives at home with husband   Right handed   Caffeine: maybe 1 cup/day   Social Determinants of Health   Financial Resource Strain: Not on file  Food Insecurity: Not on file  Transportation Needs: Not on file  Physical Activity: Not on file  Stress: Not on file  Social Connections: Not on file  Intimate Partner Violence: Not on file     PHYSICAL EXAM  Vitals:   07/20/20 1436  BP: (!) 115/56  Pulse: 68  Weight: 176 lb (79.8 kg)  Height: 5\' 3"  (1.6 m)   Body mass index is 31.18 kg/m.  Generalized: Pleasant elderly Caucasian lady,Obese female in no acute distress  Head: normocephalic and atraumatic,.   Neck: Supple, no carotid bruits  Cardiac: Regular rate rhythm, no murmur  Musculoskeletal: No deformity   Neurological examination   Mentation: Alert oriented to time, place, history taking. Attention span and concentration appropriate. Recent and remote memory intact.  Follows all commands speech and language fluent.  Cranial nerve II-XII: Pupils were equal round reactive to  light extraocular movements were full, no nystagmus, left eye mild drooping at rest which increases with sustained upgaze for 1 minute.  No diplopia and ophthalmoplegia today. Facial sensation and strength were normal. hearing was intact to finger rubbing bilaterally. Uvula tongue midline. head turning and shoulder shrug were normal and symmetric.Tongue protrusion into cheek strength was normal. Motor: normal bulk and tone, full strength in the BUE, BLE, except decreased weakness distally right  lower extremity,  fine finger movements normal Sensory: normal and symmetric to light touch, pinprick, and  Vibration, in the upper and lower extremities  Coordination: finger-nose-finger, heel-to-shin bilaterally, no dysmetria Reflexes: 1+ upper lower and symmetric, plantar responses were flexor bilaterally. Gait and Station: Rising up from seated position without assistance, normal stance,  moderate stride, good arm swing, smooth turning. Ambulates without assistive device     ASSESSMENT AND PLAN 82 year Caucasian lady with gradual onset drooping of eyelids in December 2016 due to ocular myasthenia. She has positive acetylcholine receptor antibodies. She has some GI intolerance to Mestinon but otherwise eye drooping and diplopia are mostly stable with noted some improvement visit.   Ocular myasthenia -Continue Mestinon 60 mg 3 times daily -refill provided -Difficulty tolerating 4 times daily dosing due to GI side effects -GI side effects improved with 3 times daily dosing and denies worsening of her symptoms but actually some improvement -Advised her to call with any worsening of symptoms and may consider use of prednisone which she may consider if needed   Follow-up in 6 months or call earlier if needed    CC:  [place] provider: Dr. Leonie Man Medicine, [facility]   I spent 25 minutes of face-to-face and non-face-to-face time with patient.  This included previsit chart review, lab review, study  review, order entry, electronic health record documentation, patient education and discussion regarding ocular myasthenia, further treatment options and interventions and answered all other questions to patient satisfaction  Frann Rider, [facility]  [facility] [address] [place], [place] [address]  Phone [phone] Fax [phone] Note: This document was prepared with digital dictation and possible smart phrase technology. Any transcriptional errors that result from this process are unintentional.

## 2020-07-20 NOTE — Progress Notes (Signed)
I agree with the above plan 

## 2020-07-20 NOTE — Patient Instructions (Signed)
Your Plan:  Continue Mestinon 60mg  three times daily  Routinely follow with your eye doctor  If symptoms should worsen, please call office    Follow up in 6 months or call earlier if needed     Thank you for coming to see [place] at [facility]. I hope we have been able to provide you high quality care today.  You may receive a patient satisfaction survey over the next few weeks. We would appreciate your feedback and comments so that we may continue to improve ourselves and the health of our patients.

## 2020-07-21 DIAGNOSIS — Z20822 Contact with and (suspected) exposure to covid-19: Secondary | ICD-10-CM | POA: Diagnosis not present

## 2020-07-24 ENCOUNTER — Ambulatory Visit (INDEPENDENT_AMBULATORY_CARE_PROVIDER_SITE_OTHER): Payer: PPO | Admitting: Internal Medicine

## 2020-07-24 ENCOUNTER — Encounter: Payer: Self-pay | Admitting: Internal Medicine

## 2020-07-24 ENCOUNTER — Other Ambulatory Visit: Payer: Self-pay

## 2020-07-24 ENCOUNTER — Ambulatory Visit: Payer: PPO | Admitting: Internal Medicine

## 2020-07-24 VITALS — BP 138/76 | HR 79 | Temp 97.5°F | Ht 63.0 in | Wt 181.6 lb

## 2020-07-24 DIAGNOSIS — R768 Other specified abnormal immunological findings in serum: Secondary | ICD-10-CM

## 2020-07-24 DIAGNOSIS — J849 Interstitial pulmonary disease, unspecified: Secondary | ICD-10-CM

## 2020-07-24 DIAGNOSIS — Z23 Encounter for immunization: Secondary | ICD-10-CM

## 2020-07-24 DIAGNOSIS — G7000 Myasthenia gravis without (acute) exacerbation: Secondary | ICD-10-CM

## 2020-07-24 LAB — PULMONARY FUNCTION TEST
DL/VA % pred: 109 %
DL/VA: 4.55 ml/min/mmHg/L
DLCO cor % pred: 95 %
DLCO cor: 17.75 ml/min/mmHg
DLCO unc % pred: 95 %
DLCO unc: 17.75 ml/min/mmHg
FEF 25-75 Pre: 3.83 L/sec
FEF2575-%Pred-Pre: 225 %
FEV1-%Pred-Pre: 113 %
FEV1-Pre: 2.35 L
FEV1FVC-%Pred-Pre: 119 %
FEV6-%Pred-Pre: 99 %
FEV6-Pre: 2.61 L
FEV6FVC-%Pred-Pre: 105 %
FVC-%Pred-Pre: 94 %
FVC-Pre: 2.61 L
Pre FEV1/FVC ratio: 90 %
Pre FEV6/FVC Ratio: 100 %

## 2020-07-24 NOTE — Patient Instructions (Addendum)
ICD-10-CM   1. 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 Myasthenia gravis ([place])  G70.00     Interstitial lung disease is stable.  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 Chase Caller in 6 months for office visit  -At the time of return ILD symptom score and simple walk test

## 2020-07-24 NOTE — Progress Notes (Signed)
IOV 06/04/2016  Chief Complaint  Patient presents with  . Advice Only    referred by Dr. Estanislado [name] for ILD, abn ct.     75 year old female referred by Dr. Keturah [name] in the rheumatology clinic for evaluation of dyspnea in the setting of positive CCP and CT chest suggestive of ILD  History is taken from her and review of the outside chart of rheumatology and neurology January 2018  She is significantly obese although this is been stable for the last several years. Several years ago she developed knee issues and is wearing a brace/shoulder and her right lower extremity. Since then she's been less mobile and less active. Corresponding with this she's had insidious onset of dyspnea on exertion for climbing stairs and other activities that are class II-III nature. This dyspnea has been stable all along without any associated weight change. It is present with exertion relieved by rest. Is known orthopnea paroxysmal nocturnal dyspnea. She's had occasional associated dry cough but this is not consistent in the same with wheezing which is not consistent. There is no associated chest pain.  According to her history she's been seeing Dr. 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 According to her and review of the chart this is localized only to the ocular muscles she is on pyridostigmine. At this time a high-resolution CT chest was done 04/27/2016 that I personally visualized that shows coronary artery calcification which is unaware of and also mild subtle ILD changes. She subsequently saw Dr. Blake [name] January 2018 and autoimmune profile showed elevated CCP that was isolated but no clinical or systemic evidence of rheumatoid arthritis. The diagnoses on the joints is still osteoarthritis. Therefore she's been referred here. There is no associated chest pain     review of the chart shows she's not had an echocardiogram a  cardiac stress test btu she tells me she has one at [facility] with her cardioloigist 2 years ago was normal  Walking desaturation test 185 feet 3 laps on room air: Did not desaturate.  Results for Crystal, [name] (MRN [id number]) as of 06/04/2016 15:47  Ref. Range 01/15/2016 13:00  Acetylchol Block Ab Latest Ref Range: 0 - 25 % 31 (H)  Acety choline binding ab Latest Ref Range: 0.00 - 0.24 nmol/L 6.16 (H)  Acetylcholine Modulat Ab Latest Ref Range: 0 - 20 % 48 (H)   Results for Crystal, [name] (MRN [id number]) as of 06/04/2016 15:47  Ref. Range 05/02/2016 10:00  CK Total Latest Ref Range: 7 - 177 U/L 99  Sed Rate Latest Ref Range: 0 - 30 mm/hr 4  Anit Nuclear Antibody(ANA) Latest Ref Range: NEGATIVE  NEG  Angiotensin-Converting Enzyme Latest Ref Range: 9 - 67 U/L 34  Cyclic Citrullin Peptide Ab Latest Units: Units 109 (H)  ds DNA Ab Latest Units: IU/mL <1  Myeloperoxidase Abs Latest Ref Range: <1.0 AI  <1.0  Serine Protease 3 Latest Ref Range: <1.0 AI  <1.0  RA Latex Turbid. Latest Ref Range: <14 IU/mL <14  ENA SM Ab Ser-aCnc Latest Ref Range: <1.0 NEG AI  <1.0 NEG  Ribonucleic Protein(ENA) Antibody, IgG Latest Ref Range: <1.0 NEG AI  <1.0 NEG  SSA (Ro) (ENA) Antibody, IgG Latest Ref Range: <1.0 NEG AI  <1.0 NEG  SSB (La) (ENA) Antibody, IgG Latest Ref Range: <1.0 NEG AI  <1.0 NEG  Scleroderma (Scl-70) (ENA) Antibody, IgG Latest Ref Range: <1.0  NEG AI  <1.0 NEG      OV 12/03/2016  Chief Complaint  Patient presents with  . Follow-up    Pt states her breathing is unchanged since last OV. Pt denies significant cough, CP/tightness, f/c/s.    Follow-up shortness of breath that is multifactorial but associated with interstitial lung disease on CT scan of the chest December 2017 associated with positive CCP antibody and a diagnosis of osteoarthritis but not rheumatoid arthritis in the joints  She continues to be stable. There are no interim issues no chest pain. She only has mild shortness of  breath with exertion. Her ocular myasthenia is improved. Pulmonary function test shows mild reduction in isolated diffusion capacity 74%. Forced vital capacity and lung volumes are normal. High-resolution CT chest shows subtle ILD in the bases non-UIP pattern.  CT chest high resolution IMPRESSION: 1. 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 2. Aortic atherosclerosis, in addition to left main and 3 vessel coronary artery disease. Please note that although the presence of coronary artery calcium documents the presence of coronary artery disease, the severity of this disease and any potential stenosis cannot be assessed on this non-gated CT examination. Assessment for potential risk factor modification, dietary therapy or pharmacologic therapy may be warranted, if clinically indicated. 3. Mild hepatic steatosis. 4. Additional incidental findings, as above.  Aortic Atherosclerosis (ICD10-I70.0).   Electronically Signed   By: [name] M.D.   On: 11/29/2016 10:15    OV 06/03/2017   Chief Complaint  Patient presents with  . Follow-up    PFT done today.  Pt states her breathing is about the same as last visit.  Pt recently had a sinus infection but is not currently on any meds. Has complaints of a cough with green mucus. Denies any CP.    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  In the setting of myasthenia gravis  Follow-up coronary artery calcification  Last visit August 2018.  Overall stable since then.  She reports significant dyspnea when climbing stairs but otherwise doing well.  Overall stable.  And walking desaturation test she got very tachycardic even though she did not desaturate.  She is known to have  coronary artery calcification and I referred her to cardiology but today she tells me that she follows for this at [facility] with Dr. Claudie [name] and her stress test is upcoming.  She has never been to pulmonary rehabilitation.  There is no associated chest pain or cough.  Follow pulmonary function test shows continued stability but perhaps reduction in DLCO.  On exam she has no crackles.     Walking desaturation test on 06/03/2017 185 feet x 3 laps on ROOM AIR:  did NOT desaturate. Rest pulse ox was 100%, final pulse ox was 99%. HR response 75/min at rest to 112/min at peak exertion. Patient [name]  Did ot Desaturate < 88% . [name] did not  Desaturated </= 3% points. [name] yes did get tachyardic   OV 03/03/2018  Subjective:  Patient ID: [name], female , DOB: [birth year] , age 70 y.o. , MRN: [id number] , ADDRESS: [place] [address]   03/03/2018 -   Chief Complaint  Patient presents with  . Follow-up    pt c/o sob  with exertion.  denies cough, chest congestion.     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  In the setting of myasthenia gravis  Also coronary arter calcification  -     HPI [name] 75 y.o. -returns for follow-up of ILD not otherwise specified.  After last visit she did see her cardiologist.  According to her there is no discussion about the coronary artery calcification but she recollects a normal stress test with the last few to several years.  She started attending pulmonary rehabilitation for the last few to several months.  She is been doing well with this and improved dyspnea.  Overall she is stable.  She had high-resolution CT chest in October 2019 that is unchanged from February 2018.  The pattern is described as either indeterminate or alternative diagnosis.   This seems to be some craniocaudal gradient with reticulations and traction bronchiectasis.  However there is some air trapping as well.  She is attending pulmonary rehabilitation and this is improved her dyspnea.  However the etiology of her ILD still unknown.  She did see Dr. D rheumatologist January 30, 2018 and has been advised that she has osteoarthritis based on my chart review.  She is on as needed follow-up.  Patient is wondering about the etiology of her ILD and is wondering about the safety of surgical lung biopsy.  Did a history retake and she did admit to having RAYNAUD phenomenon mild in the last few winters of her fingers.  T    Results for CARLISIA, GENO (MRN [id number]) as of 06/03/2017 11:52  Ref. Range 12/03/2016 09:51 06/03/2017 10:48  FVC-Pre Latest Units: L 2.66 2.66  FVC-%Pred-Pre Latest Units: % 92 93   Results for Crystal, [name] (MRN [id number]) as of 06/03/2017 11:52  Ref. Range 12/03/2016 09:51 06/03/2017 10:48  DLCO unc Latest Units: ml/min/mmHg 17.17 16.35  DLCO unc % pred Latest Units: % 74 71     HRCT 01/30/18 - [place]:  11/29/2016 high-resolution chest CT.  FINDINGS: Lungs/Pleura: No pneumothorax. No pleural effusion. No acute consolidative airspace disease, lung masses or significant pulmonary nodules. There is mild patchy subpleural reticulation and ground-glass attenuation in both lungs with associated mild traction bronchiectasis. There is a basilar gradient to these findings. No frank honeycombing. Mild patchy air trapping in both lungs on the expiration sequence. Interval stability of these findings.  Upper abdomen: Cholecystectomy.  Musculoskeletal: No aggressive appearing focal osseous lesions. Marked thoracic spondylosis. Partially visualized surgical hardware from ACDF in the lower cervical spine.  IMPRESSION: 1. Continued stability of basilar predominant fibrotic interstitial lung disease without frank honeycombing. Findings are  considered most compatible with an alternative diagnosis to UIP per ATS consensus criteria. 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 2. Mild patchy air trapping in both lungs, indicative of small airways disease. 3. Three-vessel coronary atherosclerosis.  Aortic Atherosclerosis (ICD10-I70.0).   Electronically Signed   By: Ilona Sorrel M.D.   On: 01/30/2018 08:07   IMPRESSION: Mild incomplete relaxation of the cricopharyngeus, but otherwise normal for age esophagram.   Electronically Signed   By: Genevie Ann M.D.   On: 02/10/2018 10:  OV 06/02/2018  Subjective:  Patient ID: [name], female , DOB: [birth year] , age 73 y.o. , MRN: [id number] , ADDRESS: [place] [address]   06/02/2018 -   Chief Complaint  Patient presents with  .  Follow-up    PFT performed today.  Pt states she has been doing good since last visit and denies any complaints.    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  In the setting of myasthenia gravis    HPI KAYONA FOOR 75 y.o. -reports for follow-up.  In the interim she has had no new problems.  She had pulmonary function test.  The FVC is stable/slight decline.  Similarly DLCO even though the percentage is higher the wrong number shows possible decline.  This is because of the new equation called GLI.e. question that we are using.  Because of various possibilities of ILD #29 visit we did extensive autoimmune panel and this was all negative.  Currently the only positive result is the CCP antibody which was 109 in January 2018.  I have not over connective tissue disease history again but she denies.  In fact this time I asked her about RAYNAUD again and at this time she denies.  There are no new issues.  We discussed about the need to do a surgical  lung biopsy.  She is somewhat nervous because of obesity, sleep apnea and myasthenia gravis.  However, she is open to talking to the surgeon about it.  She is attending pulmonary rehabilitation and this helps her.       ROS - per HPI    OV 08/31/2019  Subjective:  Patient ID: [name], female , DOB: [birth year] , age 63 y.o. , MRN: [id number] , ADDRESS: [place] [address]   08/31/2019 -   Chief Complaint  Patient presents with  . 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  In the setting of myasthenia gravis    HPI [name] 75 y.o. -6 presents for follow-up of her interstitial lung disease.  I last saw her in February 2020.  Referred to Dr. Roxan Hockey for surgical lung biopsy because of indeterminate UIP pattern.  She is scheduled the surgery within the pandemic broke out in March 2020 the surgery was canceled.  She has been isolating.  No new interim issues.  She has had a Covid vaccine.  She never developed Covid.  Her dyspnea is stable.  Stairs make it more difficult.  Dyspnea is relieved by rest rest of the symptoms listed below.  She is still open to having surgical lung biopsy.  Pulmonary function test done shows decrease in FVC but increase in DLCO again suggesting technical issues.  Her walking desaturation test is normal.  She is attending pulmonary rehabilitation at [facility].     OV 01/13/2020   Subjective:  Patient ID: [name], female , DOB: Mar 02, 1946, age 40 y.o. years. 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  In the setting of myasthenia gravis  Also OSA on CPAP on room air   Chief Complaint  Patient presents with  . Follow-up    pt  is sob on exertion       HPI [name] 75 y.o. -returns for follow-up.  Last seen in May 2021.  At the time wanted to restage her ILD.  Today she tells me she is overall stable but then her symptom score for dyspnea shows significant worsening of dyspnea.  In talking to her I learned that her myasthenia gravis is now in flareup.  She has seen neurology last seen by Frann Rider end of August 2021.  They have asked her to uptitrate her Mestinon which she is doing with this causing significant nausea.  She says that she still feels tired and has difficulty with vision and easily has ptosis.  She does use a CPAP for her sleep apnea.  In terms of her lung function her FVC has declined 5% roughly since last year but the DLCO itself is stable.  Her high-resolution CT chest shows that her ILD itself is stable over nearly 2 years.  Her walking desaturation test shows tendency to drop pulse oximeter but this in the setting of both ILD and active myasthenia gravis.  ROS - per HPI  PFT Results Latest Ref Rng & Units 08/10/2019 06/02/2018 06/03/2017 12/03/2016  FVC-Pre L 2.47 2.59 2.66 2.66  FVC-Predicted Pre % 88 91 93 92  FVC-Post L - - - 2.49  FVC-Predicted Post % - - - 86  Pre FEV1/FVC % % 92 92 89 90  Post FEV1/FCV % % - - - 95  FEV1-Pre L 2.27 2.38 2.38 2.39  FEV1-Predicted Pre % 107 110 110 110  FEV1-Post L - - - 2.35  DLCO uncorrected ml/min/mmHg 17.01 15.58 16.35 17.17  DLCO UNC% % 91 83 71 74  DLCO corrected ml/min/mmHg 17.52 - - 18.34  DLCO COR %Predicted % 93 - - 80  DLVA Predicted % 101 90 84 98  TLC L - - - 4.43  TLC % Predicted % - - - 90  RV % Predicted % - - - 79     IMPRESSION: High-resolution CT chest May 2021 Spectrum of findings  compatible with basilar predominant fibrotic interstitial lung disease without frank honeycombing, without convincing interval progression since 01/29/2018 or since the baseline 04/10/2016 study. 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 Findings are indeterminate for UIP per consensus guidelines: Diagnosis of Idiopathic Pulmonary Fibrosis: An Official ATS/ERS/JRS/ALAT Clinical Practice Guideline. [place], Iss 5, [phone], Dec 28 2016.  Three-vessel coronary atherosclerosis.  Aortic Atherosclerosis (ICD10-I70.0).   Electronically Signed   By: Ilona Sorrel M.D.   On: 09/08/2019 15:40     OV 07/24/2020  Subjective:  Patient ID: [name], female , DOB: June 30, 1945 , age 42 y.o. , MRN: [id number] , ADDRESS: [place] [address] PCP Medicine, [facility] Patient Care Team: Medicine, [facility] as PCP - General (Internal Medicine)  This Provider for this visit: Treatment Team:  Attending Provider: Brand Males, MD    07/24/2020 -   Chief Complaint  Patient presents with  . Follow-up    Follow up after PFT     ICD-10-CM   1. Interstitial pulmonary disease ([place])  J84.9   2. 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 Myasthenia gravis ([place])  G70.00      HPI [name] 75 y.o. -returns for follow-up.  Since her last visit she says her myasthenia is better but still not fully improved in fact this  evening sitting in the office she has left-sided ptosis.  She says she is on a higher dose of Mestinon but is causing diarrhea as documented below.  But definitely dyspnea scores are better with improvement in the myasthenia gravis as documented below.  She had pulmonary function testing that shows the [facility] is better again showing improvement from myasthenia.  DLCO is the same suggesting pulmonary fibrosis itself is stable.  At this point in time she is  just on supportive care and watchful waiting and serial monitoring.     SYMPTOM SCALE - ILD 08/31/2019  01/13/2020  07/24/2020   O2 use ra ra ra  Shortness of Breath 0 -> 5 scale with 5 being worst (score 6 If unable to do)    At rest 0 2 0  Simple tasks - showers, clothes change, eating, shaving 0 2 0  Household (dishes, doing bed, laundry) 0 3 2  Shopping 1 3 2   Walking level at own pace 2 3 1   Walking up Stairs 4 2 3   Total (30-36) Dyspnea Score 7 15 8   How bad is your cough? 1 1 2   How bad is your fatigue 4 4 4   How bad is nausea 0 4 2  How bad is vomiting?  0 0 2  How bad is diarrhea? 2 4 4  - mestinon  How bad is anxiety? 2 2 2   How bad is depression 2 1 2          Simple office walk 185 feet x  3 laps goal with forehead probe 03/03/2018  06/02/2018  08/31/2019  01/13/2020  07/24/2020   O2 used Room air Room air Room air ra ra  Number laps completed 3 3 3 3    Comments about pace normal normal normal  avg pace  Resting Pulse Ox/HR 100% and 66/min 98% and 72/min 99% and 74/min 98% and 62/min 98% and 76  Final Pulse Ox/HR 99% and 108/min 96% and 57/mn 98% and 106/min 91% and 74/min 99% and 119  Desaturated </= 88% no no no no no  Desaturated <= 3% points no no no Yes, 7 points no  Got Tachycardic >/= 90/min yes no yes no   Symptoms at end of test x Mild dyspnea Dyspnea at 3rd lap No dyspnea Mild dysonea  Miscellaneous comments x         PFT  PFT Results Latest Ref Rng & Units 07/24/2020 08/10/2019 06/02/2018 06/03/2017 12/03/2016  FVC-Pre L 2.61 2.47 2.59 2.66 2.66  FVC-Predicted Pre % 94 88 91 93 92  FVC-Post L - - - - 2.49  FVC-Predicted Post % - - - - 86  Pre FEV1/FVC % % 90 92 92 89 90  Post FEV1/FCV % % - - - - 95  FEV1-Pre L 2.35 2.27 2.38 2.38 2.39  FEV1-Predicted Pre % 113 107 110 110 110  FEV1-Post L - - - - 2.35  DLCO uncorrected ml/min/mmHg 17.75 17.01 15.58 16.35 17.17  DLCO UNC% % 95 91 83 71 74  DLCO corrected ml/min/mmHg 17.75 17.52 - - 18.34  DLCO COR  %Predicted % 95 93 - - 80  DLVA Predicted % 109 101 90 84 98  TLC L - - - - 4.43  TLC % Predicted % - - - - 90  RV % Predicted % - - - - 79       has a past medical history of Arthritis, Bursitis, Diabetes mellitus without complication ([place]), Dyspnea, Fibromyalgia, GERD (gastroesophageal reflux disease), Hyperlipemia,  Hypertension, Myasthenia gravis ([place]), Sinusitis, Sleep apnea, Sleep apnea, and Wears glasses.   reports that she has never smoked. She has never used smokeless tobacco.  Past Surgical History:  Procedure Laterality Date  . ABDOMINAL HYSTERECTOMY  1999  . BREAST SURGERY  2002   rt br bx  . CERVICAL FUSION  2007  . CHOLECYSTECTOMY  2005   lap choli  . COLONOSCOPY    . FINGER GANGLION CYST EXCISION    . INCISION AND DRAINAGE ABSCESS     Staph infection with multiple abdominal wounds s/p I&D in [place] ~ 2014  . SHOULDER ARTHROSCOPY WITH OPEN ROTATOR CUFF REPAIR AND DISTAL CLAVICLE ACROMINECTOMY Right 09/09/2012   Procedure: Right Shoulder Arthroscopy with distal clavicle excision and mini-open rotator cuff repair.;  Surgeon: Alta Corning, MD;  Location: [facility];  Service: Orthopedics;  Laterality: Right;  . TONSILLECTOMY      Allergies  Allergen Reactions  . Celebrex [Celecoxib] Hives and Swelling    SWELLING REACTION UNSPECIFIED   . Vioxx [Rofecoxib] Hives  . Latex Rash    Immunization History  Administered Date(s) Administered  . Fluad Quad(high Dose 65+) 01/28/2019  . Influenza Split 02/02/2016  . Influenza, High Dose Seasonal PF 03/08/2015, 01/27/2017, 02/20/2017, 01/27/2018, 01/13/2020  . Influenza-Unspecified 02/25/2014  . Moderna Sars-Covid-2 Vaccination 05/04/2019, 07/27/2019  . Pneumococcal Conjugate-13 06/01/2015  . Pneumococcal Polysaccharide-23 01/28/2012  . Pneumococcal-Unspecified 06/04/2013  . Tdap 04/29/2009  . Zoster Recombinat (Shingrix) 12/06/2019    Family History  Problem Relation Age of Onset  . Emphysema Father   .  Intracerebral hemorrhage Sister   . Myasthenia gravis Neg Hx        none that she knows of      Current Outpatient Medications:  .  ACCU-CHEK AVIVA PLUS test strip, Apply 1 strip topically daily. for testing, Disp: , Rfl: 1 .  ACCU-CHEK SOFTCLIX LANCETS lancets, TEST DAILY E11.9, Disp: , Rfl: 1 .  APPLE CIDER VINEGAR PO, Take 1 tablet by mouth daily. , Disp: , Rfl:  .  Biotin w/ Vitamins C & E (HAIR/SKIN/NAILS PO), Take 1 tablet by mouth daily., Disp: , Rfl:  .  Calcium Carb-Cholecalciferol 600-800 MG-UNIT TABS, Take 1 tablet by mouth daily., Disp: , Rfl:  .  cetirizine (ZYRTEC) 10 MG tablet, Take 10 mg by mouth at bedtime. , Disp: , Rfl: 0 .  Co-Enzyme Q-10 100 MG CAPS, Take 100 mg by mouth daily. , Disp: , Rfl:  .  diclofenac sodium (VOLTAREN) 1 % GEL, Apply 1 application topically 3 (three) times daily as needed (arthritis). , Disp: , Rfl:  .  empagliflozin (JARDIANCE) 25 MG TABS tablet, Jardiance 25 mg tablet, Disp: , Rfl:  .  fluticasone (FLONASE) 50 MCG/ACT nasal spray, Place 2 sprays into both nostrils at bedtime. , Disp: , Rfl:  .  gabapentin (NEURONTIN) 300 MG capsule, Take 300 mg by mouth at bedtime. , Disp: , Rfl:  .  Ginger, Zingiber officinalis, (GINGER ROOT) 550 MG CAPS, Take 550 mg by mouth daily. , Disp: , Rfl:  .  glipiZIDE (GLUCOTROL XL) 10 MG 24 hr tablet, Take 10 mg by mouth 2 (two) times daily. , Disp: , Rfl:  .  levOCARNitine Fumarate 500 MG TABS, Take 500 mg by mouth daily. , Disp: , Rfl:  .  losartan-hydrochlorothiazide (HYZAAR) 100-25 MG tablet, Take 1 tablet by mouth every evening. , Disp: , Rfl:  .  metFORMIN (GLUCOPHAGE-XR) 500 MG 24 hr tablet, Take 1,000 mg by mouth  2 (two) times daily with a meal. , Disp: , Rfl:  .  metoprolol tartrate (LOPRESSOR) 25 MG tablet, Take 12.5 mg by mouth daily., Disp: , Rfl:  .  Misc Natural Products (TART CHERRY ADVANCED) CAPS, Take 1 capsule by mouth 2 (two) times daily. , Disp: , Rfl:  .  Misc. Devices MISC, Avivia Plus lancets  and strips. Tests daily  DX E11.9, Disp: , Rfl:  .  multivitamin-lutein (OCUVITE-LUTEIN) CAPS capsule, Take 1 capsule by mouth daily., Disp: , Rfl:  .  Nutritional Supplements (EQ ESTROBLEND MENOPAUSE) TABS, Take 1 tablet by mouth every evening. , Disp: , Rfl:  .  omega-3 acid ethyl esters (LOVAZA) 1 g capsule, Take 1 g by mouth daily., Disp: , Rfl:  .  pregabalin (LYRICA) 50 MG capsule, Take 50 mg by mouth 2 (two) times daily. , Disp: , Rfl:  .  pyridostigmine (MESTINON) 60 MG tablet, Take 1 tablet (60 mg total) by mouth 3 (three) times daily., Disp: 120 tablet, Rfl: 5 .  Rosuvastatin Calcium 10 MG CPSP, Take 5 mg by mouth at bedtime. , Disp: , Rfl:  .  sitaGLIPtin (JANUVIA) 100 MG tablet, Take 100 mg by mouth daily. , Disp: , Rfl:  .  TRULICITY 1.5 MG/8.6PY SOPN, Inject 1.5 mLs into the skin once a week., Disp: , Rfl:  .  Turmeric Curcumin 500 MG CAPS, Take 500 mg by mouth every evening. , Disp: , Rfl:  .  VITAMIN D PO, Take by mouth., Disp: , Rfl:       Objective:   Vitals:   07/24/20 1606  BP: 138/76  Pulse: 79  Temp: (!) 97.5 F (36.4 C)  TempSrc: Temporal  SpO2: 100%  Weight: 181 lb 9.6 oz (82.4 kg)  Height: 5\' 3"  (1.6 m)    Estimated body mass index is 32.17 kg/m as calculated from the following:   Height as of this encounter: 5\' 3"  (1.6 m).   Weight as of this encounter: 181 lb 9.6 oz (82.4 kg).  @WEIGHTCHANGE @  [employer]   07/24/20 1606  Weight: 181 lb 9.6 oz (82.4 kg)     Physical Exam General: No distress. obese Neuro: Alert and Oriented x 3. GCS 15. Speech normal Psych: Pleasant Resp:  Barrel Chest - no.  Wheeze - no, Crackles -some crackles at the base, No overt respiratory distress CVS: Normal heart sounds. 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 PEERL +. No post nasal drip        Assessment:       ICD-10-CM   1. 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 Myasthenia gravis ([place])  G70.00        Plan:     Patient Instructions     ICD-10-CM   1. Interstitial pulmonary disease ([place])  J84.9   2. 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 Myasthenia gravis ([place])  G70.00     Interstitial lung disease is stable.  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 Chase Caller in 6 months for office visit  -At the time of return ILD symptom score and simple walk test     SIGNATURE    Dr. Brand Males, M.D., F.C.C.P,  Pulmonary and Critical Care Medicine Staff Physician, [place] Director - Interstitial Lung Disease  Program  [facility] [place], [place], [address]  Pager: [phone], If no answer or between  15:00h - 7:00h: call [phone] Telephone: [phone]  4:28 PM 07/24/2020

## 2020-07-24 NOTE — Progress Notes (Signed)
PFT done today. 

## 2020-07-27 DIAGNOSIS — I10 Essential (primary) hypertension: Secondary | ICD-10-CM | POA: Diagnosis not present

## 2020-08-01 DIAGNOSIS — E1165 Type 2 diabetes mellitus with hyperglycemia: Secondary | ICD-10-CM | POA: Diagnosis not present

## 2020-08-01 DIAGNOSIS — E7800 Pure hypercholesterolemia, unspecified: Secondary | ICD-10-CM | POA: Diagnosis not present

## 2020-08-01 DIAGNOSIS — G4730 Sleep apnea, unspecified: Secondary | ICD-10-CM | POA: Diagnosis not present

## 2020-08-01 DIAGNOSIS — I10 Essential (primary) hypertension: Secondary | ICD-10-CM | POA: Diagnosis not present

## 2020-08-01 DIAGNOSIS — Z299 Encounter for prophylactic measures, unspecified: Secondary | ICD-10-CM | POA: Diagnosis not present

## 2020-08-03 IMAGING — CT CT CHEST HIGH RESOLUTION W/O CM
2 of 5 series · 15 of 36 positions shown, 18 images · non-contrast
Comparison: 11/29/2016 high-resolution chest CT.

CLINICAL DATA: Follow-up interstitial lung disease. Chronic cough
and dyspnea.

EXAM:
CT CHEST WITHOUT CONTRAST
TECHNIQUE: Multidetector CT imaging of the chest was performed following the
standard protocol without intravenous contrast. High resolution
imaging of the lungs, as well as inspiratory and expiratory imaging,
was performed.

[Series 2: high resolution · axial · 0.71mm/px · z∈[-303,-25]mm · 12 of 153 slices shown, 15 images]
[im 7/153  mediastinal]
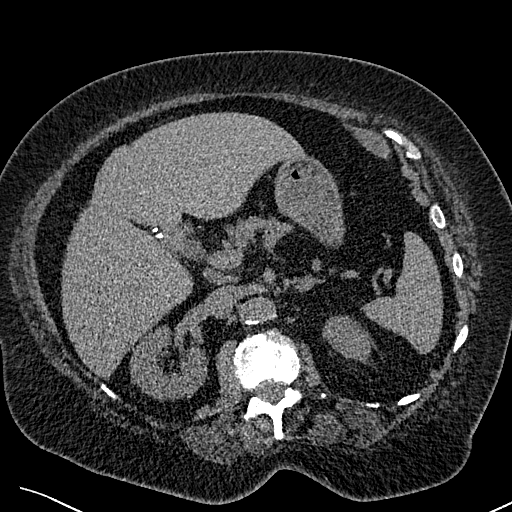
[im 7/153  lung]
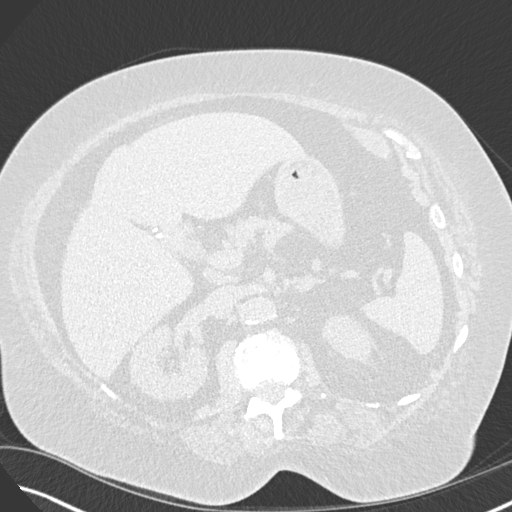
[im 21/153  lung]
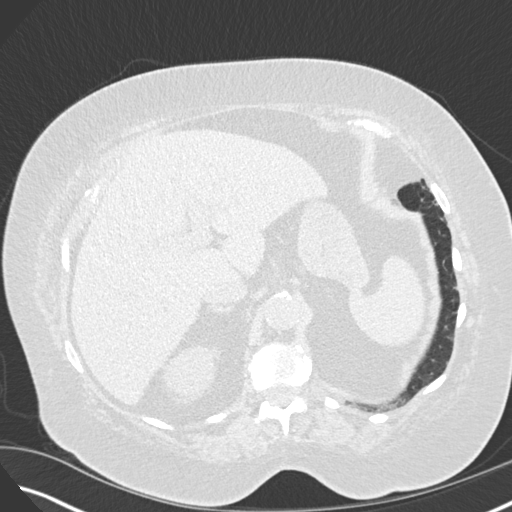
[im 35/153  lung]
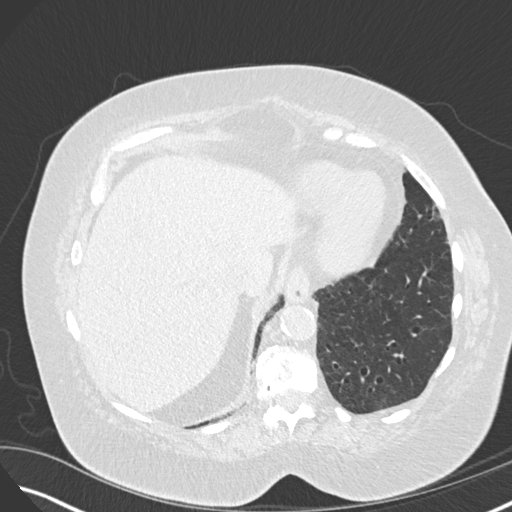
[im 49/153  lung]
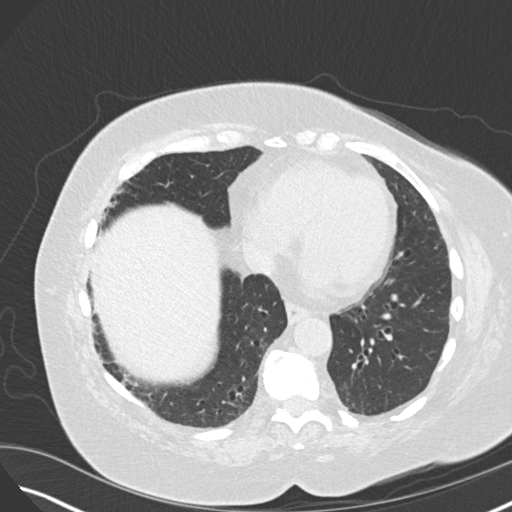
[im 56/153  mediastinal]
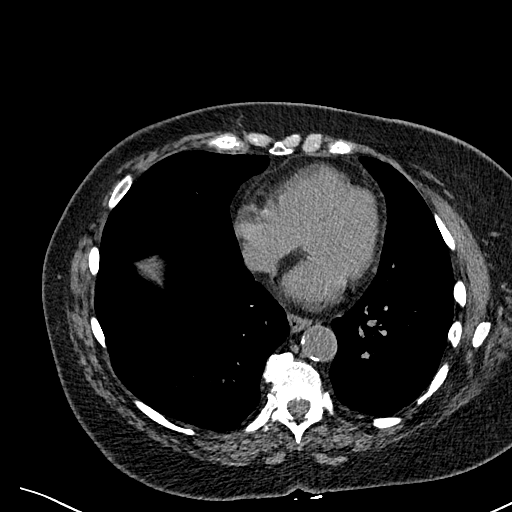
[im 56/153  lung]
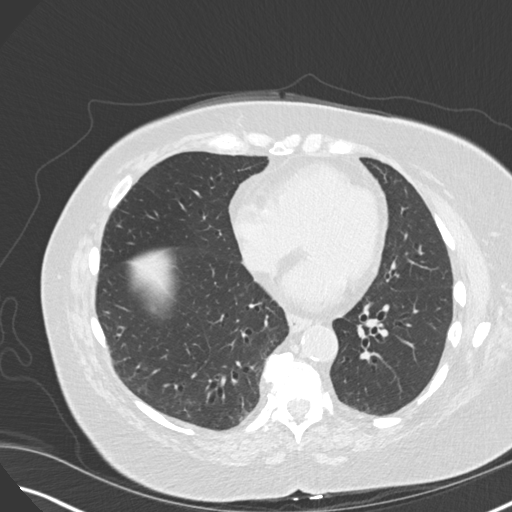
[im 70/153  lung]
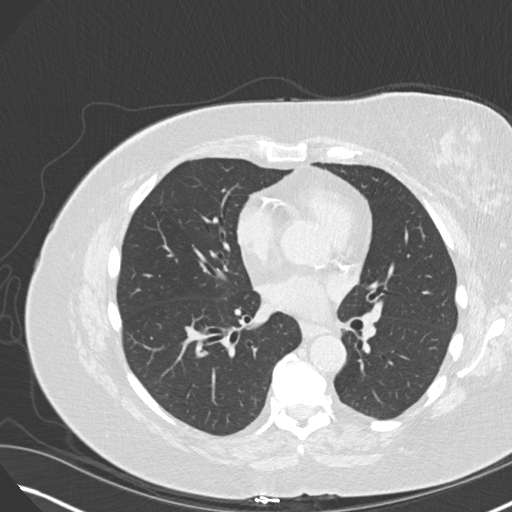
[im 83/153  lung]
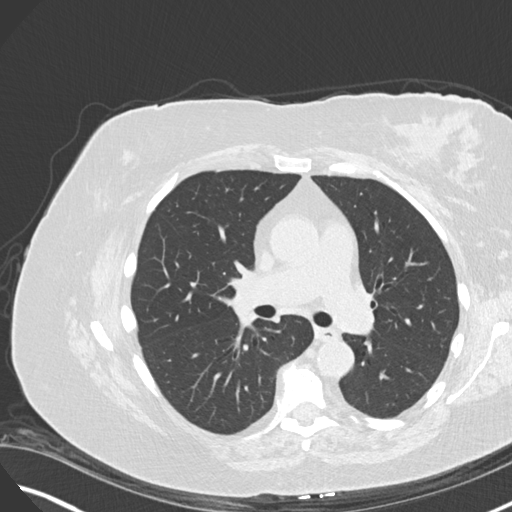
[im 97/153  lung]
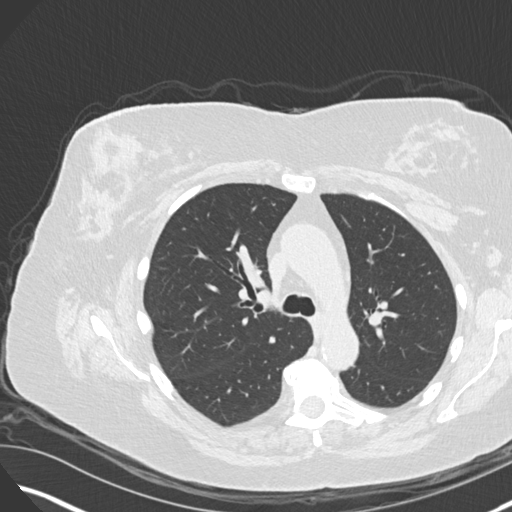
[im 104/153  mediastinal]
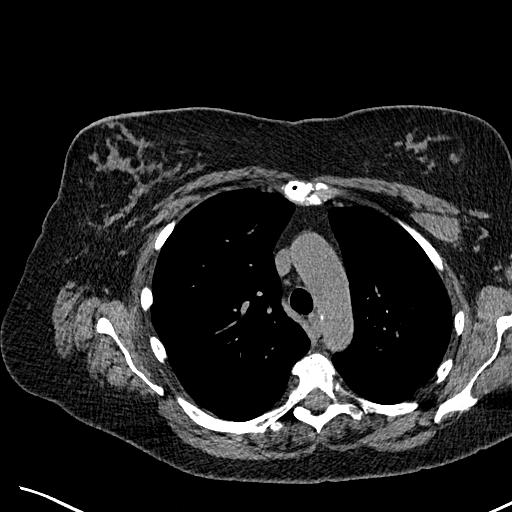
[im 104/153  lung]
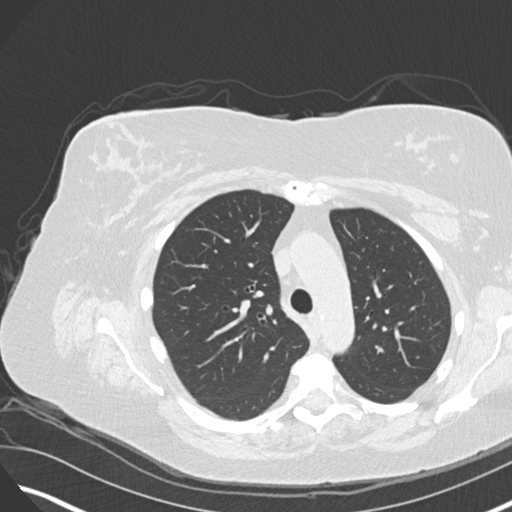
[im 118/153  lung]
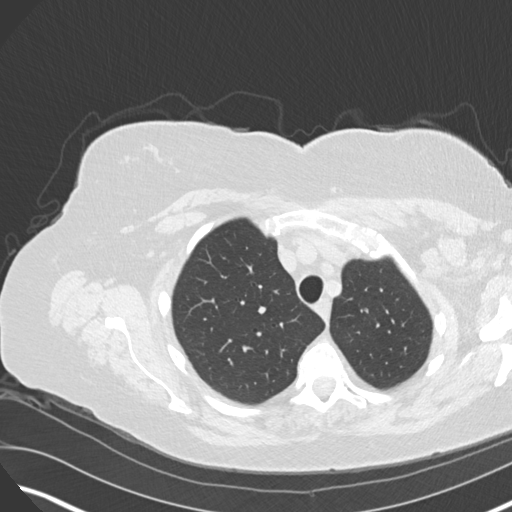
[im 132/153  lung]
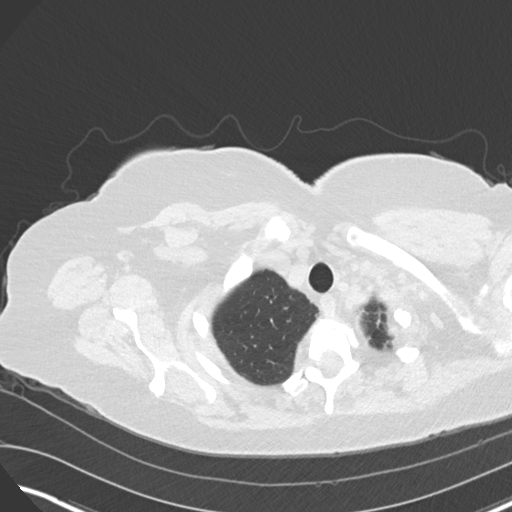
[im 146/153  lung]
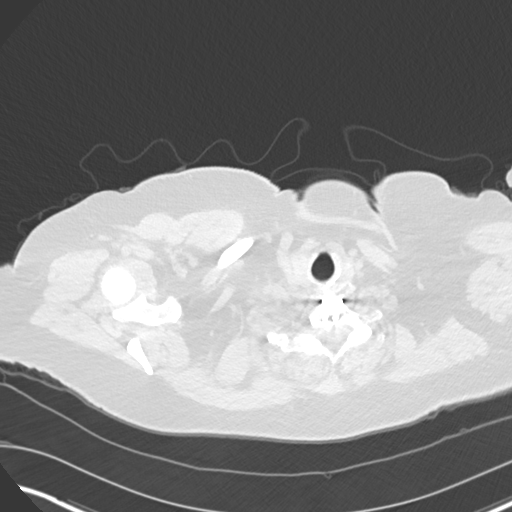

[Series 9: coronal · coronal · 0.62mm/px · 3 of 120 slices shown]
[im 24/120  lung]
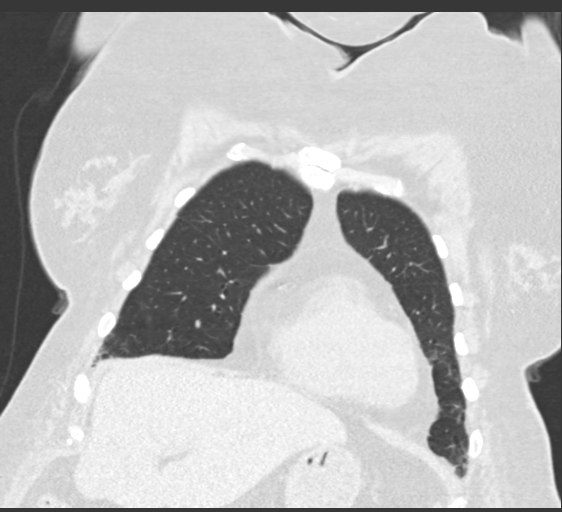
[im 48/120  lung]
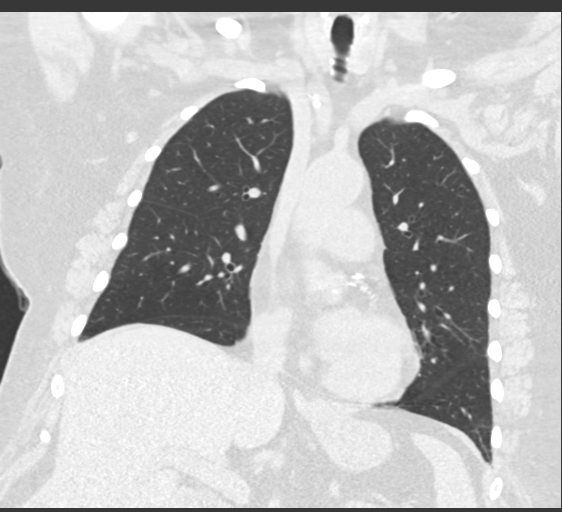
[im 72/120  lung]
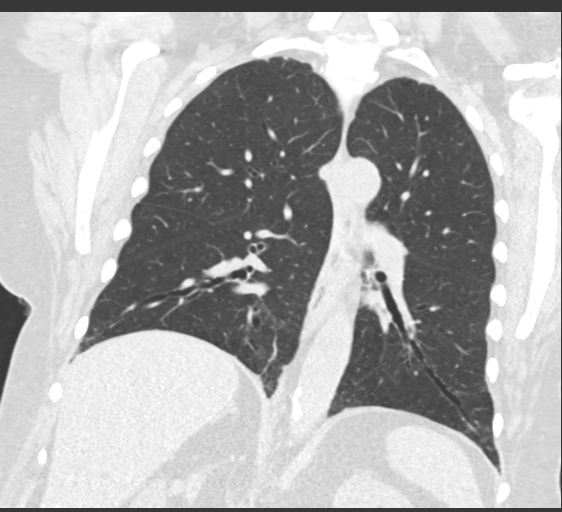

[15 of 36 positions shown; findings below may reference images not displayed]

FINDINGS: Cardiovascular: Normal heart size. No significant pericardial
effusion/thickening. Atherosclerotic nonaneurysmal thoracic aorta.
Normal caliber pulmonary arteries. Three-vessel coronary
atherosclerosis.

Mediastinum/Nodes: No discrete thyroid nodules. Unremarkable
esophagus. No pathologically enlarged axillary, mediastinal or hilar
lymph nodes, noting limited sensitivity for the detection of hilar
adenopathy on this noncontrast study.

Lungs/Pleura: No pneumothorax. No pleural effusion. No acute
consolidative airspace disease, lung masses or significant pulmonary
nodules. There is mild patchy subpleural reticulation and
ground-glass attenuation in both lungs with associated mild traction
bronchiectasis. There is a basilar gradient to these findings. No
frank honeycombing. Mild patchy air trapping in both lungs on the
expiration sequence. Interval stability of these findings.

Upper abdomen: Cholecystectomy.

Musculoskeletal: No aggressive appearing focal osseous lesions.
Marked thoracic spondylosis. Partially visualized surgical hardware
from ACDF in the lower cervical spine.
IMPRESSION: 1. Continued stability of basilar predominant fibrotic interstitial
lung disease without frank honeycombing. Findings are considered
most compatible with an alternative diagnosis to UIP per ATS
consensus criteria. Fibrotic phase nonspecific interstitial
pneumonia (NSIP) is favored.
2. Mild patchy air trapping in both lungs, indicative of small
airways disease.
3. Three-vessel coronary atherosclerosis.

Aortic Atherosclerosis ([id number]).

## 2020-08-14 DIAGNOSIS — H01004 Unspecified blepharitis left upper eyelid: Secondary | ICD-10-CM | POA: Diagnosis not present

## 2020-08-21 DIAGNOSIS — L0390 Cellulitis, unspecified: Secondary | ICD-10-CM | POA: Diagnosis not present

## 2020-08-21 DIAGNOSIS — W57XXXA Bitten or stung by nonvenomous insect and other nonvenomous arthropods, initial encounter: Secondary | ICD-10-CM | POA: Diagnosis not present

## 2020-08-21 DIAGNOSIS — S70361A Insect bite (nonvenomous), right thigh, initial encounter: Secondary | ICD-10-CM | POA: Diagnosis not present

## 2020-08-25 DIAGNOSIS — I10 Essential (primary) hypertension: Secondary | ICD-10-CM | POA: Diagnosis not present

## 2020-09-14 DIAGNOSIS — M79676 Pain in unspecified toe(s): Secondary | ICD-10-CM | POA: Diagnosis not present

## 2020-09-14 DIAGNOSIS — B351 Tinea unguium: Secondary | ICD-10-CM | POA: Diagnosis not present

## 2020-09-14 DIAGNOSIS — Z23 Encounter for immunization: Secondary | ICD-10-CM | POA: Diagnosis not present

## 2020-09-25 DIAGNOSIS — I10 Essential (primary) hypertension: Secondary | ICD-10-CM | POA: Diagnosis not present

## 2020-09-25 DIAGNOSIS — E1165 Type 2 diabetes mellitus with hyperglycemia: Secondary | ICD-10-CM | POA: Diagnosis not present

## 2020-09-25 DIAGNOSIS — E0849 Diabetes mellitus due to underlying condition with other diabetic neurological complication: Secondary | ICD-10-CM | POA: Diagnosis not present

## 2020-09-26 DIAGNOSIS — I10 Essential (primary) hypertension: Secondary | ICD-10-CM | POA: Diagnosis not present

## 2020-10-12 DIAGNOSIS — H6982 Other specified disorders of Eustachian tube, left ear: Secondary | ICD-10-CM | POA: Diagnosis not present

## 2020-10-12 DIAGNOSIS — J00 Acute nasopharyngitis [common cold]: Secondary | ICD-10-CM | POA: Diagnosis not present

## 2020-10-26 DIAGNOSIS — I10 Essential (primary) hypertension: Secondary | ICD-10-CM | POA: Diagnosis not present

## 2020-11-07 DIAGNOSIS — I10 Essential (primary) hypertension: Secondary | ICD-10-CM | POA: Diagnosis not present

## 2020-11-07 DIAGNOSIS — Z299 Encounter for prophylactic measures, unspecified: Secondary | ICD-10-CM | POA: Diagnosis not present

## 2020-11-07 DIAGNOSIS — E1165 Type 2 diabetes mellitus with hyperglycemia: Secondary | ICD-10-CM | POA: Diagnosis not present

## 2020-11-07 DIAGNOSIS — G7000 Myasthenia gravis without (acute) exacerbation: Secondary | ICD-10-CM | POA: Diagnosis not present

## 2020-11-07 DIAGNOSIS — I779 Disorder of arteries and arterioles, unspecified: Secondary | ICD-10-CM | POA: Diagnosis not present

## 2020-11-13 ENCOUNTER — Emergency Department (HOSPITAL_COMMUNITY): Payer: PPO

## 2020-11-13 ENCOUNTER — Other Ambulatory Visit: Payer: Self-pay

## 2020-11-13 ENCOUNTER — Emergency Department (HOSPITAL_COMMUNITY)
Admission: EM | Admit: 2020-11-13 | Discharge: 2020-11-13 | Disposition: A | Discharge status: Home / Self Care | Payer: PPO | Attending: Emergency Medicine | Admitting: Emergency Medicine

## 2020-11-13 ENCOUNTER — Encounter (HOSPITAL_COMMUNITY): Payer: Self-pay | Admitting: *Deleted

## 2020-11-13 DIAGNOSIS — M79641 Pain in right hand: Secondary | ICD-10-CM | POA: Diagnosis not present

## 2020-11-13 DIAGNOSIS — E119 Type 2 diabetes mellitus without complications: Secondary | ICD-10-CM | POA: Diagnosis not present

## 2020-11-13 DIAGNOSIS — Z7984 Long term (current) use of oral hypoglycemic drugs: Secondary | ICD-10-CM | POA: Diagnosis not present

## 2020-11-13 DIAGNOSIS — Z91040 Latex allergy status: Secondary | ICD-10-CM | POA: Diagnosis not present

## 2020-11-13 DIAGNOSIS — S0990XA Unspecified injury of head, initial encounter: Secondary | ICD-10-CM | POA: Insufficient documentation

## 2020-11-13 DIAGNOSIS — Z23 Encounter for immunization: Secondary | ICD-10-CM | POA: Insufficient documentation

## 2020-11-13 DIAGNOSIS — M1990 Unspecified osteoarthritis, unspecified site: Secondary | ICD-10-CM | POA: Insufficient documentation

## 2020-11-13 DIAGNOSIS — S80211A Abrasion, right knee, initial encounter: Secondary | ICD-10-CM | POA: Insufficient documentation

## 2020-11-13 DIAGNOSIS — I10 Essential (primary) hypertension: Secondary | ICD-10-CM | POA: Diagnosis not present

## 2020-11-13 DIAGNOSIS — W01198A Fall on same level from slipping, tripping and stumbling with subsequent striking against other object, initial encounter: Secondary | ICD-10-CM | POA: Insufficient documentation

## 2020-11-13 DIAGNOSIS — Z79899 Other long term (current) drug therapy: Secondary | ICD-10-CM | POA: Diagnosis not present

## 2020-11-13 DIAGNOSIS — Y92481 Parking lot as the place of occurrence of the external cause: Secondary | ICD-10-CM | POA: Insufficient documentation

## 2020-11-13 DIAGNOSIS — S60011A Contusion of right thumb without damage to nail, initial encounter: Secondary | ICD-10-CM

## 2020-11-13 DIAGNOSIS — M25561 Pain in right knee: Secondary | ICD-10-CM | POA: Diagnosis not present

## 2020-11-13 DIAGNOSIS — T148XXA Other injury of unspecified body region, initial encounter: Secondary | ICD-10-CM

## 2020-11-13 DIAGNOSIS — M1711 Unilateral primary osteoarthritis, right knee: Secondary | ICD-10-CM | POA: Diagnosis not present

## 2020-11-13 DIAGNOSIS — S60931A Unspecified superficial injury of right thumb, initial encounter: Secondary | ICD-10-CM | POA: Diagnosis present

## 2020-11-13 DIAGNOSIS — W19XXXA Unspecified fall, initial encounter: Secondary | ICD-10-CM

## 2020-11-13 MED ORDER — TETANUS-DIPHTH-ACELL PERTUSSIS 5-2.5-18.5 LF-MCG/0.5 IM SUSY
0.5000 mL | PREFILLED_SYRINGE | Freq: Once | INTRAMUSCULAR | Status: AC
  Administered 2020-11-13: 0.5 mL via INTRAMUSCULAR
  Filled 2020-11-13: qty 0.5

## 2020-11-13 MED ORDER — ACETAMINOPHEN 325 MG PO TABS
650.0000 mg | ORAL_TABLET | Freq: Once | ORAL | Status: AC
  Administered 2020-11-13: 650 mg via ORAL
  Filled 2020-11-13: qty 2

## 2020-11-13 NOTE — ED Provider Notes (Signed)
[facility] EMERGENCY DEPARTMENT Provider Note   CSN: [id number] Arrival date & time: 11/13/20  1319     History Chief Complaint  Patient presents with   Lytle Michaels    [name] is a 75 y.o. female.  75 year old female with history of diabetes and myasthenia gravis presents with injuries from a fall.  Patient was in the hospital parking lot when she tripped on a curb and fell injuring her right knee, right thumb and striking the right side of her head.  Patient is not anticoagulated, denies loss of consciousness, states she does have mild headache.  No other injuries, complaints, concerns.      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   Sinusitis    Sleep apnea    uses a cpap   Sleep apnea    Wears glasses     Patient Active Problem List   Diagnosis Date Noted   Positive anti-CCP test 12/03/2016   Coronary artery calcification seen on CAT scan 12/03/2016   ILD (interstitial lung disease) ([place]) 06/04/2016   Dyspnea and respiratory abnormality 06/04/2016   Primary osteoarthritis of both hands 05/01/2016   Primary osteoarthritis of both knees 05/01/2016   DDD (degenerative disc disease), lumbar 05/01/2016   Trochanteric bursitis of both hips 05/01/2016   Other secondary scoliosis, 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   History of sleep apnea 05/01/2016   History of hypertension 05/01/2016   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   Dyslipidemia 05/01/2016   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    Past Surgical History:  Procedure Laterality Date   ABDOMINAL HYSTERECTOMY  1999   BREAST SURGERY  2002   rt br bx   CERVICAL FUSION  2007   CHOLECYSTECTOMY  2005   lap choli   COLONOSCOPY     FINGER GANGLION CYST EXCISION     INCISION AND DRAINAGE ABSCESS     Staph infection with  multiple abdominal wounds s/p I&D in [place] ~ 2014   SHOULDER ARTHROSCOPY WITH OPEN ROTATOR CUFF REPAIR AND DISTAL CLAVICLE ACROMINECTOMY Right 09/09/2012   Procedure: Right Shoulder Arthroscopy with distal clavicle excision and mini-open rotator cuff repair.;  Surgeon: Alta Corning, MD;  Location: [facility];  Service: Orthopedics;  Laterality: Right;   TONSILLECTOMY       OB History   No obstetric history on file.     Family History  Problem Relation Age of Onset   Emphysema Father    Intracerebral hemorrhage Sister    Myasthenia gravis Neg Hx        none that she knows of     Social History   Tobacco Use   Smoking status: Never   Smokeless tobacco: Never  Vaping Use   Vaping Use: Never used  Substance Use Topics   Alcohol use: No   Drug use: No    Home Medications Prior to Admission medications   Medication Sig Start Date End Date Taking? Authorizing Provider  ACCU-CHEK AVIVA PLUS test strip Apply 1 strip topically daily. for testing 11/28/16   [provider]  ACCU-CHEK SOFTCLIX LANCETS lancets TEST DAILY E11.9 11/28/16   [provider]  APPLE CIDER VINEGAR PO Take 1 tablet by mouth daily.     [provider]  Biotin w/ Vitamins C & E (HAIR/SKIN/NAILS PO) Take 1 tablet by mouth daily.    [provider]  Calcium Carb-Cholecalciferol 600-800 MG-UNIT TABS Take 1 tablet by mouth daily.    [provider]  cetirizine (ZYRTEC) 10 MG tablet Take 10 mg by mouth at bedtime.  12/24/16   [provider]  Co-Enzyme Q-10 100 MG CAPS Take 100 mg by mouth daily.     [provider]  diclofenac sodium (VOLTAREN) 1 % GEL Apply 1 application topically 3 (three) times daily as needed (arthritis).     [provider]  empagliflozin (JARDIANCE) 25 MG TABS tablet Jardiance 25 mg tablet 05/04/19   [provider]  fluticasone (FLONASE) 50 MCG/ACT nasal spray Place 2 sprays into both nostrils at bedtime.   01/06/16   [provider]  gabapentin (NEURONTIN) 300 MG capsule Take 300 mg by mouth at bedtime.  02/09/16   [provider]  Ginger, Zingiber officinalis, (GINGER ROOT) 550 MG CAPS Take 550 mg by mouth daily.     [provider]  glipiZIDE (GLUCOTROL XL) 10 MG 24 hr tablet Take 10 mg by mouth 2 (two) times daily.  10/31/16   [provider]  levOCARNitine Fumarate 500 MG TABS Take 500 mg by mouth daily.     [provider]  losartan-hydrochlorothiazide (HYZAAR) 100-25 MG tablet Take 1 tablet by mouth every evening.  10/17/16   [provider]  metFORMIN (GLUCOPHAGE-XR) 500 MG 24 hr tablet Take 1,000 mg by mouth 2 (two) times daily with a meal.  12/24/16   [provider]  metoprolol tartrate (LOPRESSOR) 25 MG tablet Take 12.5 mg by mouth daily. 08/30/19   [provider]  Misc Natural Products (TART CHERRY ADVANCED) CAPS Take 1 capsule by mouth 2 (two) times daily.     [provider]  Misc. Devices MISC Avivia Plus lancets and strips. Tests daily  DX E11.9 11/26/16   [provider]  multivitamin-lutein (OCUVITE-LUTEIN) CAPS capsule Take 1 capsule by mouth daily.    [provider]  Nutritional Supplements (EQ ESTROBLEND MENOPAUSE) TABS Take 1 tablet by mouth every evening.     [provider]  omega-3 acid ethyl esters (LOVAZA) 1 g capsule Take 1 g by mouth daily.    [provider]  pregabalin (LYRICA) 50 MG capsule Take 50 mg by mouth 2 (two) times daily.     [provider]  pyridostigmine (MESTINON) 60 MG tablet Take 1 tablet (60 mg total) by mouth 3 (three) times daily. 07/20/20   Frann Rider, NP  Rosuvastatin Calcium 10 MG CPSP Take 5 mg by mouth at bedtime.     [provider]  sitaGLIPtin (JANUVIA) 100 MG tablet Take 100 mg by mouth daily.  09/02/16   [provider]  TRULICITY 1.5 WI/0.9BD SOPN Inject 1.5 mLs into the skin once a week. 11/11/19    [provider]  Turmeric Curcumin 500 MG CAPS Take 500 mg by mouth every evening.     [provider]  VITAMIN D PO Take by mouth.    [provider]    Allergies    Celebrex [celecoxib], Vioxx [rofecoxib], and Latex  Review of Systems   Review of Systems  Constitutional:  Negative for fever.  Musculoskeletal:  Positive for arthralgias, joint swelling and myalgias. Negative for back pain, neck pain and neck stiffness.  Skin:  Positive for wound.  Neurological:  Negative for weakness and numbness.  Hematological:  Negative for adenopathy.  Psychiatric/Behavioral:  Negative for confusion.    Physical Exam Updated Vital Signs BP 138/67   Pulse 62   Temp 97.8 F (36.6 C) (Oral)   Resp 18   Ht 5\' 4"  (1.626 m)   Wt 81.6 kg   SpO2 100%   BMI 30.90 kg/m   Physical Exam Vitals and nursing note reviewed.  Constitutional:      General: She is not in acute distress.    Appearance: She is well-developed. She is not diaphoretic.  HENT:     Head: Normocephalic and atraumatic.  Eyes:     Pupils: Pupils are equal, round, and reactive to light.  Cardiovascular:     Pulses: Normal pulses.  Pulmonary:     Effort: Pulmonary effort is normal.  Musculoskeletal:        General: Tenderness and signs of injury present. No swelling or deformity.     Cervical back: No tenderness or bony tenderness. No pain with movement.     Thoracic back: No tenderness or bony tenderness.     Lumbar back: No tenderness or bony tenderness.     Comments: Mild tenderness with bruising along right thumb. Mild to moderate tenderness along right anterior knee with mild abrasion.   Skin:    General: Skin is warm and dry.     Findings: No erythema or rash.  Neurological:     Mental Status: She is alert and oriented to person, place, and time.     Sensory: No sensory deficit.     Motor: No weakness.     Comments: Slight left upper lid lag which she reports is baseline from her  myasthenia gravis   Psychiatric:        Behavior: Behavior normal.    ED Results / Procedures / Treatments   Labs (all labs ordered are listed, but only abnormal results are displayed) Labs Reviewed - No data to display  EKG None  Radiology CT Head Wo Contrast  Result Date: 11/13/2020 CLINICAL DATA:  Status post fall today with a blow to the right side of the head. Initial encounter. EXAM: CT HEAD WITHOUT CONTRAST TECHNIQUE: Contiguous axial images were obtained from the base of the skull through the vertex without intravenous contrast. COMPARISON:  None. FINDINGS: Brain: No evidence of acute infarction, hemorrhage, hydrocephalus, extra-axial collection or mass lesion/mass effect. Vascular: No hyperdense vessel or unexpected calcification. Skull: Intact.  No focal lesion. Sinuses/Orbits: Status post cataract surgery.  Otherwise negative. Other: None. IMPRESSION: Negative head CT. Electronically Signed   By: Inge Rise M.D.   On: 11/13/2020 16:44   DG Knee Complete 4 Views Right  Result Date: 11/13/2020 CLINICAL DATA:  Fall after tripping on curb in hospital parking lot. Right knee pain. EXAM: RIGHT KNEE - COMPLETE 4+ VIEW COMPARISON:  None. FINDINGS: No evidence of fracture, dislocation, or joint effusion. Mild tricompartmental osteoarthritis with peripheral spurring. Small quadriceps and patellar tendon enthesophytes. Soft tissues are unremarkable. IMPRESSION: Mild tricompartmental osteoarthritis. No fracture or subluxation. Electronically Signed   By: Keith Rake M.D.   On: 11/13/2020 16:46   DG Hand Complete Right  Result Date: 11/13/2020 CLINICAL DATA:  Fall after tripping on curb in hospital parking lot. Right hand pain. EXAM: RIGHT HAND - COMPLETE 3+ VIEW COMPARISON:  None. FINDINGS: No acute fracture or dislocation. There is joint space narrowing with osteophytes and erosions involving the second and third distal interphalangeal joints. Similar findings are seen to a lesser  extent of  the fifth digit distal interphalangeal joint. Osteoarthritis of the thumb at the carpal metacarpal joint with joint space narrowing, osteophytes and subchondral cystic change. No focal soft tissue abnormality. IMPRESSION: 1. No acute fracture or dislocation of the right hand. 2. 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 3. Findings suggestive of erosive osteoarthritis of the second, third, possibly fifth digits. Electronically Signed   By: Keith Rake M.D.   On: 11/13/2020 16:51    Procedures Procedures   Medications Ordered in ED Medications  Tdap (BOOSTRIX) injection 0.5 mL (has no administration in time range)  acetaminophen (TYLENOL) tablet 650 mg (650 mg Oral Given 11/13/20 1632)    ED Course  I have reviewed the triage vital signs and the nursing notes.  Pertinent labs & imaging results that were available during my care of the patient were reviewed by me and considered in my medical decision making (see chart for details).  Clinical Course as of 11/13/20 1700  Mon Nov 14, 6079  8852 75 year old female presents after a fall in the parking lot today.  Exam is reassuring, does have minor abrasion to the right knee with minor bruising to the right palm.  X-rays are negative for acute injury.  CT head unremarkable.  Recommend apply topical Voltaren gel to knee and hand as needed and follow-up with PCP.   [LM]    Clinical Course User Index [LM] Roque Lias   MDM Rules/Calculators/A&P                          Final Clinical Impression(s) / ED Diagnoses Final diagnoses:  Fall, initial encounter  Abrasion  Contusion of right thumb without damage to nail, initial encounter  Arthritis    Rx / DC Orders ED Discharge Orders     None        Tacy Learn, PA-C 11/13/20 1700    Daleen Bo, MD 11/14/20 0001

## 2020-11-13 NOTE — ED Triage Notes (Signed)
Pt states she fell while in hospital parking lot; pt tripped on curb; pt states she hit right side of head and scrapped right knee and right wrist pain

## 2020-11-13 NOTE — ED Notes (Signed)
Pt states she did hit her head after tripping on curb in the hospital's parking lot.  denies taking any blood thinners.  Fell on right side- c/o right thumb pain, abrasion noted to right knee, pt states she is able to stand. C/o HA

## 2020-11-13 NOTE — Discharge Instructions (Addendum)
Your CT and x-rays did not show any acute injury.  You do have arthritic changes in your hand and knee. Apply Voltaren to areas as needed as directed.  Recheck with your primary care provider if not improving.

## 2020-11-15 DIAGNOSIS — W57XXXA Bitten or stung by nonvenomous insect and other nonvenomous arthropods, initial encounter: Secondary | ICD-10-CM | POA: Diagnosis not present

## 2020-11-15 DIAGNOSIS — S70361A Insect bite (nonvenomous), right thigh, initial encounter: Secondary | ICD-10-CM | POA: Diagnosis not present

## 2020-11-24 DIAGNOSIS — I10 Essential (primary) hypertension: Secondary | ICD-10-CM | POA: Diagnosis not present

## 2020-12-06 DIAGNOSIS — G4733 Obstructive sleep apnea (adult) (pediatric): Secondary | ICD-10-CM | POA: Diagnosis not present

## 2020-12-12 DIAGNOSIS — M79676 Pain in unspecified toe(s): Secondary | ICD-10-CM | POA: Diagnosis not present

## 2020-12-12 DIAGNOSIS — B351 Tinea unguium: Secondary | ICD-10-CM | POA: Diagnosis not present

## 2020-12-27 DIAGNOSIS — E7849 Other hyperlipidemia: Secondary | ICD-10-CM | POA: Diagnosis not present

## 2020-12-27 DIAGNOSIS — E1165 Type 2 diabetes mellitus with hyperglycemia: Secondary | ICD-10-CM | POA: Diagnosis not present

## 2020-12-27 DIAGNOSIS — I10 Essential (primary) hypertension: Secondary | ICD-10-CM | POA: Diagnosis not present

## 2020-12-28 DIAGNOSIS — H01001 Unspecified blepharitis right upper eyelid: Secondary | ICD-10-CM | POA: Diagnosis not present

## 2020-12-28 DIAGNOSIS — H6982 Other specified disorders of Eustachian tube, left ear: Secondary | ICD-10-CM | POA: Diagnosis not present

## 2021-01-25 ENCOUNTER — Ambulatory Visit: Payer: PPO | Admitting: Adult Health

## 2021-01-26 DIAGNOSIS — E7800 Pure hypercholesterolemia, unspecified: Secondary | ICD-10-CM | POA: Diagnosis not present

## 2021-01-26 DIAGNOSIS — I10 Essential (primary) hypertension: Secondary | ICD-10-CM | POA: Diagnosis not present

## 2021-02-02 DIAGNOSIS — Z01419 Encounter for gynecological examination (general) (routine) without abnormal findings: Secondary | ICD-10-CM | POA: Diagnosis not present

## 2021-02-02 DIAGNOSIS — Z1231 Encounter for screening mammogram for malignant neoplasm of breast: Secondary | ICD-10-CM | POA: Diagnosis not present

## 2021-02-02 DIAGNOSIS — Z6833 Body mass index (BMI) 33.0-33.9, adult: Secondary | ICD-10-CM | POA: Diagnosis not present

## 2021-02-05 DIAGNOSIS — E119 Type 2 diabetes mellitus without complications: Secondary | ICD-10-CM | POA: Diagnosis not present

## 2021-02-26 DIAGNOSIS — E7800 Pure hypercholesterolemia, unspecified: Secondary | ICD-10-CM | POA: Diagnosis not present

## 2021-02-26 DIAGNOSIS — I10 Essential (primary) hypertension: Secondary | ICD-10-CM | POA: Diagnosis not present

## 2021-03-13 DIAGNOSIS — M79676 Pain in unspecified toe(s): Secondary | ICD-10-CM | POA: Diagnosis not present

## 2021-03-13 DIAGNOSIS — B351 Tinea unguium: Secondary | ICD-10-CM | POA: Diagnosis not present

## 2021-03-15 DIAGNOSIS — R21 Rash and other nonspecific skin eruption: Secondary | ICD-10-CM | POA: Diagnosis not present

## 2021-03-15 DIAGNOSIS — I10 Essential (primary) hypertension: Secondary | ICD-10-CM | POA: Diagnosis not present

## 2021-03-15 DIAGNOSIS — Z2821 Immunization not carried out because of patient refusal: Secondary | ICD-10-CM | POA: Diagnosis not present

## 2021-03-15 DIAGNOSIS — E1165 Type 2 diabetes mellitus with hyperglycemia: Secondary | ICD-10-CM | POA: Diagnosis not present

## 2021-03-15 DIAGNOSIS — R233 Spontaneous ecchymoses: Secondary | ICD-10-CM | POA: Diagnosis not present

## 2021-03-15 DIAGNOSIS — Z299 Encounter for prophylactic measures, unspecified: Secondary | ICD-10-CM | POA: Diagnosis not present

## 2021-03-29 DIAGNOSIS — R194 Change in bowel habit: Secondary | ICD-10-CM | POA: Diagnosis not present

## 2021-03-29 DIAGNOSIS — Z86010 Personal history of colonic polyps: Secondary | ICD-10-CM | POA: Diagnosis not present

## 2021-03-29 DIAGNOSIS — R1033 Periumbilical pain: Secondary | ICD-10-CM | POA: Diagnosis not present

## 2021-03-29 DIAGNOSIS — R634 Abnormal weight loss: Secondary | ICD-10-CM | POA: Diagnosis not present

## 2021-03-29 DIAGNOSIS — Z1211 Encounter for screening for malignant neoplasm of colon: Secondary | ICD-10-CM | POA: Diagnosis not present

## 2021-03-29 DIAGNOSIS — Z800 Family history of malignant neoplasm of digestive organs: Secondary | ICD-10-CM | POA: Diagnosis not present

## 2021-04-02 ENCOUNTER — Telehealth: Payer: Self-pay | Admitting: Adult Health

## 2021-04-02 NOTE — Telephone Encounter (Signed)
LVM for patient to call back to reschedule due to the office closing early on 12/15.

## 2021-04-12 ENCOUNTER — Ambulatory Visit: Payer: PPO | Admitting: Adult Health

## 2021-04-12 DIAGNOSIS — N8110 Cystocele, unspecified: Secondary | ICD-10-CM | POA: Diagnosis not present

## 2021-04-12 DIAGNOSIS — R32 Unspecified urinary incontinence: Secondary | ICD-10-CM | POA: Diagnosis not present

## 2021-05-01 DIAGNOSIS — J029 Acute pharyngitis, unspecified: Secondary | ICD-10-CM | POA: Diagnosis not present

## 2021-05-01 DIAGNOSIS — J329 Chronic sinusitis, unspecified: Secondary | ICD-10-CM | POA: Diagnosis not present

## 2021-05-01 DIAGNOSIS — H9209 Otalgia, unspecified ear: Secondary | ICD-10-CM | POA: Diagnosis not present

## 2021-05-01 DIAGNOSIS — R059 Cough, unspecified: Secondary | ICD-10-CM | POA: Diagnosis not present

## 2021-05-01 DIAGNOSIS — R0981 Nasal congestion: Secondary | ICD-10-CM | POA: Diagnosis not present

## 2021-05-08 DIAGNOSIS — I340 Nonrheumatic mitral (valve) insufficiency: Secondary | ICD-10-CM | POA: Diagnosis not present

## 2021-05-08 DIAGNOSIS — I6529 Occlusion and stenosis of unspecified carotid artery: Secondary | ICD-10-CM | POA: Diagnosis not present

## 2021-05-08 DIAGNOSIS — I739 Peripheral vascular disease, unspecified: Secondary | ICD-10-CM | POA: Diagnosis not present

## 2021-05-10 DIAGNOSIS — I340 Nonrheumatic mitral (valve) insufficiency: Secondary | ICD-10-CM | POA: Diagnosis not present

## 2021-05-10 DIAGNOSIS — I6529 Occlusion and stenosis of unspecified carotid artery: Secondary | ICD-10-CM | POA: Diagnosis not present

## 2021-05-10 DIAGNOSIS — I739 Peripheral vascular disease, unspecified: Secondary | ICD-10-CM | POA: Diagnosis not present

## 2021-05-27 DIAGNOSIS — I10 Essential (primary) hypertension: Secondary | ICD-10-CM | POA: Diagnosis not present

## 2021-05-30 ENCOUNTER — Encounter: Payer: Self-pay | Admitting: Adult Health

## 2021-05-30 ENCOUNTER — Ambulatory Visit: Payer: PPO | Admitting: Adult Health

## 2021-05-30 VITALS — BP 144/76 | HR 74 | Ht 63.0 in | Wt 190.0 lb

## 2021-05-30 DIAGNOSIS — G7000 Myasthenia gravis without (acute) exacerbation: Secondary | ICD-10-CM

## 2021-05-30 MED ORDER — PYRIDOSTIGMINE BROMIDE 60 MG PO TABS: 60.0000 mg | ORAL_TABLET | Freq: Three times a day (TID) | ORAL | 3 refills | Status: DC

## 2021-05-30 NOTE — Progress Notes (Signed)
[facility]  PATIENT: [name] DOB: 1946-04-21   REASON FOR VISIT: Follow-up for ocular myasthenia HISTORY FROM: Patient   HISTORY:  [name] is a 76 y.o. 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 Leonie Man with initial visit 02/2016    Consult 03/07/16 Dr. Leonie Man;  [name] is a 89 year pleasant Caucasian lady whose had droopy eyelids first started in December 2016 when she stated states that "I was drooping and she will barely open it. She was seen by primary physician who thought she had partial Bell's palsy. This did not get better and subsequently in June she saw ophthalmologist who noticed that she had some infection on the lower eyelid treated with antibiotics. Since then she started that she has intermittent opening of both eyelids left more than right. There are times when arises is completely shut and on other times she can see well. In fact on inquiry she states that a couple of occasion while driving she was seeing double the dividing lines of the road for crossing over. She is also noticed that she gets fatigued and tired very easily. Initially when this first began this was thought to be related to viral infection but this does not seem to be getting better. She denies any difficulty moving her eyes, any dysphagia, weakness in her arms or legs. She has no trouble getting out, chair or climbing up and down steps. She has a good appetite and she had not lost any weight. Review of electronic medical record shows that she had elevated acetylcholine receptor antibodies on 01/15/16 with acetylcholine binding antibody of 6.16, blocking antibody 31 and modulating antibody 48 all of which are abnormal. Patient has not been tried on Mestinon on other medications for myasthenia. She denies any history of headaches, strokes, TIAs or other neurologic problems.  Update 1/16/2018PS : She returns for follow-up after  last visit 2 months ago. She states she has noticed benefit on Mestinon and eyes do not droop as much and she is not had any episodes of diplopia. She doesn't feel tired as much as well. However she was unable to tolerate 60 mg dose of Mestinon due to stomach cramps, nausea and is taking only 30 mg mostly 3 times a day and takes an occasional fourth dose if needed. She did undergo nerve conduction study with rapid repetitive stimulation on 04/10/16 which was negative for decremental response. The patient is reluctant to consider prednisone given her diabetes and risk of blood sugars being elevated on it  UPDATE 04/19/2018CM [name], 76 year old female returns for follow-up with history of ocular myasthenia. She is currently on Mestinon sometimes taking a full pill but mostly half pills due to nausea. She is a diabetic so she does not want to take prednisone. She has had no double vision blurred vision or any drooping of her left eye. Vision today 20/50 right 20/40 left.She denies any difficulty moving her eyes, any dysphagia, weakness in her arms or legs. She has no trouble getting out, chair or climbing up and down steps.  She continues to be active. She returns for reevaluation. She is requesting something for nausea UPDATE 10/17/2018CM  [name], 76 year old female returns for follow-up with a history of ocular myasthenia with symptoms beginning in June 2016. She denies any episodes of diplopia, no ptosis seen today. Vision today 2070 bilaterally. She denies any weakness in her arms or legs or any problems with breathing she continues to  be quite active. She continues to drive without difficulty she had 1 recent fall no injury.  She had a recent visit to her ophthalmologist. She is currently on Mestinon sometimes takes a full pill and sometimes a half pill. She has nausea but  Zofran did not help, she returns for reevaluation. Recently diagnosed with arthritis. Update 08/19/2017 :she returns for follow-up  after last visit 6 months ago. She states her cholesterol myasthenia symptoms are well controlled. She denies any drooping of the eyelids, diplopia, muscle fatigue or weakness. She states she is tired all the time but this may be related to sleep apnea and lack of exercise. She is currently on Mestinon 30 mg 4 times daily and is tolerating it quite well. She was unable to tolerate 60 mg due to diarrhea and upset stomach. She has no new complaints today. She does complain of some intermittent trouble swallowing when she thinks she swallows too quickly at times gets stuck in the middle of the chest. She denies any nasal regurg, nasal twang to her speech heart food being stuck in the back of her mouth.She states her blood pressure and sugar control have been quite good. 10/25/2019 : She returns for follow-up after last visit 2 years ago.  She states she was doing fine with her ptosis and myasthenia until 2 weeks ago when she is noticed increased droopiness of the left eye particularly towards the afternoon.  In the mornings her eyes seem much more open.  She called Dr. Jannifer Franklin who recommended increasing her Mestinon which she was taking 30 mg 4 times daily to 60 mg 3 times daily.  She has not noticed any specific benefit.  She does have mild diarrhea and nausea but is tolerating it otherwise well.  Patient in the past has not been able to tolerate higher doses of Mestinon.  Dr. Jimmye Norman did discuss adding prednisone but the patient is reluctant to try this because of her diabetes and worsening sugars.  Patient denies any weakness in her extremities, significant fatigue or tiredness.  She denies any recent respiratory illness in the form of infection.  She did lose her balance 1 she tripped but did not hurt herself.  She does have some intermittent diplopia particularly with sustained upgaze.    Update 12/21/2019 JM: [name] returns today for 80-month follow-up regarding ocular myasthenia After prior visit,  complaints of worsening left eye drooping Dr. Leonie Man recommended increasing Mestinon dosage to 60 mg 4 times daily Reports improvement of left eye symptoms but continues to have some droopiness especially when outdoors or in bright light and diplopia if both eyes are open Side effects of diarrhea and nausea with benefit with use of Imodium and a " nausea medication" that PCP started her on -she is unable to state name She is frustrated regarding continued symptoms and she has not been able to return to driving Denies any extremity weakness, fatigue or imbalance She was reluctantly placed on prednisone in July due to severe back and hip pain Reports benefit of left eye symptoms and did not notice impact on her glucose levels No further concerns  Update 03/28/2020 Dr. Leonie Man: She returns for follow-up after last visit with Janett Billow nurse practitioner 3 months ago.  She continues to have mild drooping of the left eye which is intermittent and more pronounced when she is tired and exhausted or excited.  She is taking Mestinon 60 mg 4 times daily but is having some trouble with diarrhea and upset stomach and  occasional cramps particularly during the early afternoon.  She has refused to take prednisone because of the long-term side effects.  She had a flareup of her back pain in August and was treated with a short course of prednisone which she feels transiently helped her myasthenic symptoms.  She also had a flareup of her sinus infection in October and took another short course of prednisone.  She plans to have cataract surgery soon.  She has given up driving because of fear of her eye closure.   Update 07/20/2020 JM: Returns for 4 months follow-up regarding ocular myasthenia  Continues to experience mild left eye drooping and diplopia but some improvement since prior that Currently on Mestinon 60mg  3 times daily - reports improvement of diarrhea, nausea and cramps after decreasing from 4 times to 3 times  daily dosing -thankfully symptoms have not worsened but actually improved  S/p cataract 12/2 right eye; 12/22 left eye Has been more sensitive to light since then  No new concerns at this time    Update 05/30/2021 JM:  Returns for follow-up visit after prior visit almost 1 year ago.  Reports improvement since prior visit. Only minimal left eye drooping currently and intermittent diplopia which can worsen after looking at something for too long or with low light. She has even returned back to driving without difficulty.  Continues on Mestinon 60 mg 3 times daily. Continued GI side effects but these have been stable. Continued use of Imodium PRN with benefit.         REVIEW OF SYSTEMS: Full 14 system review of systems performed and notable only for those listed, all others are neg:  Intermittent diplopia, drooping of the left eyelid only. All systems negative  ALLERGIES: Allergies  Allergen Reactions   Celebrex [Celecoxib] Hives and Swelling    SWELLING REACTION UNSPECIFIED    Vioxx [Rofecoxib] Hives   Latex Rash    HOME MEDICATIONS: Outpatient Medications Prior to Visit  Medication Sig Dispense Refill   ACCU-CHEK AVIVA PLUS test strip Apply 1 strip topically daily. for testing  1   ACCU-CHEK SOFTCLIX LANCETS lancets TEST DAILY E11.9  1   APPLE CIDER VINEGAR PO Take 1 tablet by mouth daily.      Biotin w/ Vitamins C & E (HAIR/SKIN/NAILS PO) Take 1 tablet by mouth daily.     Calcium Carb-Cholecalciferol 600-800 MG-UNIT TABS Take 1 tablet by mouth daily.     cetirizine (ZYRTEC) 10 MG tablet Take 10 mg by mouth at bedtime.   0   Co-Enzyme Q-10 100 MG CAPS Take 100 mg by mouth daily.      diclofenac sodium (VOLTAREN) 1 % GEL Apply 1 application topically 3 (three) times daily as needed (arthritis).      fluticasone (FLONASE) 50 MCG/ACT nasal spray Place 2 sprays into both nostrils at bedtime.      gabapentin (NEURONTIN) 300 MG capsule Take 300 mg by mouth at bedtime.      Ginger,  Zingiber officinalis, (GINGER ROOT) 550 MG CAPS Take 550 mg by mouth daily.      glipiZIDE (GLUCOTROL XL) 10 MG 24 hr tablet Take 10 mg by mouth 2 (two) times daily.      levOCARNitine Fumarate 500 MG TABS Take 500 mg by mouth daily.      losartan-hydrochlorothiazide (HYZAAR) 100-25 MG tablet Take 1 tablet by mouth every evening.      metFORMIN (GLUCOPHAGE-XR) 500 MG 24 hr tablet Take 1,000 mg by mouth 2 (two) times daily with  a meal.      metoprolol tartrate (LOPRESSOR) 25 MG tablet Take 12.5 mg by mouth daily.     Misc Natural Products (TART CHERRY ADVANCED) CAPS Take 1 capsule by mouth 2 (two) times daily.      Misc. Devices MISC Avivia Plus lancets and strips. Tests daily  DX E11.9     multivitamin-lutein (OCUVITE-LUTEIN) CAPS capsule Take 1 capsule by mouth daily.     Nutritional Supplements (EQ ESTROBLEND MENOPAUSE) TABS Take 1 tablet by mouth every evening.      omega-3 acid ethyl esters (LOVAZA) 1 g capsule Take 1 g by mouth daily.     pregabalin (LYRICA) 50 MG capsule Take 50 mg by mouth 2 (two) times daily.      pyridostigmine (MESTINON) 60 MG tablet Take 1 tablet (60 mg total) by mouth 3 (three) times daily. 120 tablet 5   Rosuvastatin Calcium 10 MG CPSP Take 5 mg by mouth at bedtime.      sitaGLIPtin (JANUVIA) 100 MG tablet Take 100 mg by mouth daily.      TRULICITY 1.5 XI/3.3AS SOPN Inject 1.5 mLs into the skin once a week.     Turmeric Curcumin 500 MG CAPS Take 500 mg by mouth every evening.      VITAMIN D PO Take by mouth.     empagliflozin (JARDIANCE) 25 MG TABS tablet Jardiance 25 mg tablet (Patient not taking: Reported on 05/30/2021)     No facility-administered medications prior to visit.    PAST MEDICAL HISTORY: Past Medical History:  Diagnosis Date   Arthritis    Bursitis    Diabetes mellitus without complication (HCC)    Dyspnea    Fibromyalgia    GERD (gastroesophageal reflux disease)    Hyperlipemia    Hypertension    Myasthenia gravis ([place])    ocular    Sinusitis    Sleep apnea    uses a cpap   Sleep apnea    Wears glasses     PAST SURGICAL HISTORY: Past Surgical History:  Procedure Laterality Date   ABDOMINAL HYSTERECTOMY  1999   BREAST SURGERY  2002   rt br bx   CERVICAL FUSION  2007   CHOLECYSTECTOMY  2005   lap choli   COLONOSCOPY     FINGER GANGLION CYST EXCISION     INCISION AND DRAINAGE ABSCESS     Staph infection with multiple abdominal wounds s/p I&D in [place] ~ 2014   SHOULDER ARTHROSCOPY WITH OPEN ROTATOR CUFF REPAIR AND DISTAL CLAVICLE ACROMINECTOMY Right 09/09/2012   Procedure: Right Shoulder Arthroscopy with distal clavicle excision and mini-open rotator cuff repair.;  Surgeon: Alta Corning, MD;  Location: [facility];  Service: Orthopedics;  Laterality: Right;   TONSILLECTOMY      FAMILY HISTORY: Family History  Problem Relation Age of Onset   Emphysema Father    Intracerebral hemorrhage Sister    Myasthenia gravis Neg Hx        none that she knows of     SOCIAL HISTORY: Social History   Socioeconomic History   Marital status: Married    Spouse name: Not on file   Number of children: Not on file   Years of education: Not on file   Highest education level: Not on file  Occupational History   Occupation: Retired  Tobacco Use   Smoking status: Never   Smokeless tobacco: Never  Vaping Use   Vaping Use: Never used  Substance and Sexual Activity  Alcohol use: No   Drug use: No   Sexual activity: Not on file  Other Topics Concern   Not on file  Social History Narrative   Lives at home with husband   Right handed   Caffeine: maybe 1 cup/day   Social Determinants of Health   Financial Resource Strain: Not on file  Food Insecurity: Not on file  Transportation Needs: Not on file  Physical Activity: Not on file  Stress: Not on file  Social Connections: Not on file  Intimate Partner Violence: Not on file     PHYSICAL EXAM  Vitals:   05/30/21 1435  BP: (!) 144/76  Pulse: 74   Weight: 190 lb (86.2 kg)  Height: 5\' 3"  (1.6 m)    Body mass index is 33.66 kg/m.  Generalized: Very pleasant elderly Caucasian lady,Obese female in no acute distress  Head: normocephalic and atraumatic,.   Neck: Supple, no carotid bruits  Cardiac: Regular rate rhythm, no murmur  Musculoskeletal: No deformity   Neurological examination   Mentation: Alert oriented to time, place, history taking. Attention span and concentration appropriate. Recent and remote memory intact.  Follows all commands speech and language fluent.  Cranial nerve II-XII: Pupils were equal round reactive to light extraocular movements were full, no nystagmus, left eye mild drooping at rest which increases with sustained upgaze for 1 minute.  No diplopia and ophthalmoplegia exam. Facial sensation and strength were normal. hearing was intact to finger rubbing bilaterally. Uvula tongue midline. head turning and shoulder shrug were normal and symmetric.Tongue protrusion into cheek strength was normal. Motor: normal bulk and tone, full strength in the BUE, BLE, except decreased weakness distally right  lower extremity,  fine finger movements normal Sensory: normal and symmetric to light touch, pinprick, and  Vibration, in the upper and lower extremities  Coordination: finger-nose-finger, heel-to-shin bilaterally, no dysmetria Reflexes: 1+ upper lower and symmetric, plantar responses were flexor bilaterally. Gait and Station: Rising up from seated position without assistance, normal stance,  moderate stride, good arm swing, smooth turning. Ambulates without assistive device     ASSESSMENT AND PLAN 64 year Caucasian lady with gradual onset drooping of eyelids in December 2016 due to ocular myasthenia. She has positive acetylcholine receptor antibodies. She has some GI intolerance to Mestinon but stable on Imodium    Ocular myasthenia -Continue Mestinon 60 mg 3 times daily -refill provided, intolerant to higher  doses -Continue Imodium as needed -Advised her to call with any worsening of symptoms or flare ups,  if this occurs, would recommend trial of prednisone as intolerant to higher dosages of Mestinon    Follow-up in 1 year or call earlier if needed    CC:  Medicine, [facility]   I spent 26 minutes of face-to-face and non-face-to-face time with patient.  This included previsit chart review, lab review, study review, order entry, electronic health record documentation, patient education and discussion regarding ocular myasthenia, further treatment options and interventions and answered all other questions to patient satisfaction  Frann Rider, [facility]  [facility] [address] [place], [place] [address]  Phone [phone] Fax [phone] Note: This document was prepared with digital dictation and possible smart phrase technology. Any transcriptional errors that result from this process are unintentional.

## 2021-05-30 NOTE — Patient Instructions (Addendum)
Your Plan:  Continue current regimen, no changes today    Follow up in 1 year or call earlier if needed     Thank you for coming to see [place] at [facility]. I hope we have been able to provide you high quality care today.  You may receive a patient satisfaction survey over the next few weeks. We would appreciate your feedback and comments so that we may continue to improve ourselves and the health of our patients.

## 2021-05-31 DIAGNOSIS — L821 Other seborrheic keratosis: Secondary | ICD-10-CM | POA: Diagnosis not present

## 2021-05-31 DIAGNOSIS — I8311 Varicose veins of right lower extremity with inflammation: Secondary | ICD-10-CM | POA: Diagnosis not present

## 2021-05-31 DIAGNOSIS — I8312 Varicose veins of left lower extremity with inflammation: Secondary | ICD-10-CM | POA: Diagnosis not present

## 2021-05-31 DIAGNOSIS — D2239 Melanocytic nevi of other parts of face: Secondary | ICD-10-CM | POA: Diagnosis not present

## 2021-05-31 DIAGNOSIS — L986 Other infiltrative disorders of the skin and subcutaneous tissue: Secondary | ICD-10-CM | POA: Diagnosis not present

## 2021-05-31 DIAGNOSIS — L814 Other melanin hyperpigmentation: Secondary | ICD-10-CM | POA: Diagnosis not present

## 2021-05-31 DIAGNOSIS — I872 Venous insufficiency (chronic) (peripheral): Secondary | ICD-10-CM | POA: Diagnosis not present

## 2021-05-31 DIAGNOSIS — D485 Neoplasm of uncertain behavior of skin: Secondary | ICD-10-CM | POA: Diagnosis not present

## 2021-05-31 DIAGNOSIS — L570 Actinic keratosis: Secondary | ICD-10-CM | POA: Diagnosis not present

## 2021-05-31 DIAGNOSIS — D1801 Hemangioma of skin and subcutaneous tissue: Secondary | ICD-10-CM | POA: Diagnosis not present

## 2021-05-31 DIAGNOSIS — L718 Other rosacea: Secondary | ICD-10-CM | POA: Diagnosis not present

## 2021-05-31 DIAGNOSIS — S30860S Insect bite (nonvenomous) of lower back and pelvis, sequela: Secondary | ICD-10-CM | POA: Diagnosis not present

## 2021-06-19 DIAGNOSIS — D692 Other nonthrombocytopenic purpura: Secondary | ICD-10-CM | POA: Diagnosis not present

## 2021-06-19 DIAGNOSIS — Z6831 Body mass index (BMI) 31.0-31.9, adult: Secondary | ICD-10-CM | POA: Diagnosis not present

## 2021-06-19 DIAGNOSIS — I10 Essential (primary) hypertension: Secondary | ICD-10-CM | POA: Diagnosis not present

## 2021-06-19 DIAGNOSIS — Z789 Other specified health status: Secondary | ICD-10-CM | POA: Diagnosis not present

## 2021-06-19 DIAGNOSIS — E1165 Type 2 diabetes mellitus with hyperglycemia: Secondary | ICD-10-CM | POA: Diagnosis not present

## 2021-06-19 DIAGNOSIS — I779 Disorder of arteries and arterioles, unspecified: Secondary | ICD-10-CM | POA: Diagnosis not present

## 2021-06-19 DIAGNOSIS — Z299 Encounter for prophylactic measures, unspecified: Secondary | ICD-10-CM | POA: Diagnosis not present

## 2021-06-21 DIAGNOSIS — B351 Tinea unguium: Secondary | ICD-10-CM | POA: Diagnosis not present

## 2021-06-21 DIAGNOSIS — E1142 Type 2 diabetes mellitus with diabetic polyneuropathy: Secondary | ICD-10-CM | POA: Diagnosis not present

## 2021-06-21 DIAGNOSIS — L84 Corns and callosities: Secondary | ICD-10-CM | POA: Diagnosis not present

## 2021-06-21 DIAGNOSIS — M79676 Pain in unspecified toe(s): Secondary | ICD-10-CM | POA: Diagnosis not present

## 2021-06-26 ENCOUNTER — Other Ambulatory Visit: Payer: Self-pay

## 2021-06-26 ENCOUNTER — Encounter: Payer: Self-pay | Admitting: Obstetrics and Gynecology

## 2021-06-26 ENCOUNTER — Ambulatory Visit: Payer: PPO | Admitting: Obstetrics and Gynecology

## 2021-06-26 VITALS — BP 124/75 | HR 67 | Ht 62.0 in | Wt 190.0 lb

## 2021-06-26 DIAGNOSIS — R350 Frequency of micturition: Secondary | ICD-10-CM | POA: Diagnosis not present

## 2021-06-26 DIAGNOSIS — N816 Rectocele: Secondary | ICD-10-CM | POA: Diagnosis not present

## 2021-06-26 DIAGNOSIS — R159 Full incontinence of feces: Secondary | ICD-10-CM | POA: Diagnosis not present

## 2021-06-26 DIAGNOSIS — I10 Essential (primary) hypertension: Secondary | ICD-10-CM | POA: Diagnosis not present

## 2021-06-26 DIAGNOSIS — N393 Stress incontinence (female) (male): Secondary | ICD-10-CM

## 2021-06-26 DIAGNOSIS — N993 Prolapse of vaginal vault after hysterectomy: Secondary | ICD-10-CM

## 2021-06-26 DIAGNOSIS — N3281 Overactive bladder: Secondary | ICD-10-CM

## 2021-06-26 DIAGNOSIS — N8110 Cystocele, unspecified: Secondary | ICD-10-CM | POA: Diagnosis not present

## 2021-06-26 LAB — POCT URINALYSIS DIPSTICK
Appearance: NORMAL
Bilirubin, UA: NEGATIVE
Blood, UA: NEGATIVE
Glucose, UA: NEGATIVE
Ketones, UA: NEGATIVE
Nitrite, UA: NEGATIVE
Protein, UA: NEGATIVE
Spec Grav, UA: 1.025 (ref 1.010–1.025)
Urobilinogen, UA: 0.2 E.U./dL
pH, UA: 6 (ref 5.0–8.0)

## 2021-06-26 MED ORDER — MIRABEGRON ER 25 MG PO TB24: 25.0000 mg | ORAL_TABLET | Freq: Every day | ORAL | 5 refills | Status: DC

## 2021-06-26 NOTE — Progress Notes (Signed)
[facility] New Patient Evaluation and Consultation  Referring Provider: Marylynn Pearson, MD PCP: Medicine, [facility] Internal Date of Service: 06/26/2021  SUBJECTIVE Chief Complaint: New Patient (Initial Visit) [name] is a 76 y.o. female here for an evaluation on prolapse.)  History of Present Illness: [name] is a 76 y.o. White or Caucasian female seen in consultation at the request of Dr. Julien Girt for evaluation of prolapse and incontinence.    Review of records significant for: Patient reports incontinence. Cystocele noted on exam.   Urinary Symptoms: Leaks urine with cough/ sneeze, laughing, lifting, going from sitting to standing, with a full bladder, with movement to the bathroom, with urgency, without sensation, and while asleep. Urgency > SUI Leaks 10 time(s) per day.  Pad use: 4 pads/ diapers per day.  Sometimes she has to change her clothes.  She is bothered by her UI symptoms.  Day time voids 5+.  Nocturia: 2 times per night to void. Voiding dysfunction: she does not empty her bladder well.  does not use a catheter to empty bladder.  When urinating, she feels a weak stream, difficulty starting urine stream, and the need to urinate multiple times in a row Drinks: water, occasionally will have coke zero or sugar free lemonade  UTIs:  0  UTI's in the last year.   Denies history of blood in urine and kidney or bladder stones  Pelvic Organ Prolapse Symptoms:                  She Admits to a feeling of a bulge the vaginal area. It has been present for 2 months.  She Denies seeing a bulge- husband has seen it This bulge is bothersome.  Bowel Symptom: Bowel movements: 2-3 time(s) per day Stool consistency: hard, soft , or loose Straining: yes.  Splinting: no.  Incomplete evacuation: no.  She Admits to accidental bowel leakage / fecal incontinence- sometimes gets stomach cramps   Occurs: occasionally  Consistency with leakage:  loose  Sometimes will take imodium if she knows she is going to be out somewhere Bowel regimen:  metamucil once daily Last colonoscopy: Date- 5 years ago, Results-1 polyp  Sexual Function Sexually active: no.  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   Sinusitis    Sleep apnea    uses a cpap   Sleep apnea    Wears glasses      Past Surgical History:   Past Surgical History:  Procedure Laterality Date   ABDOMINAL HYSTERECTOMY  1999   BREAST SURGERY  2002   rt br bx   CERVICAL FUSION  2007   CHOLECYSTECTOMY  2005   lap choli   COLONOSCOPY     FINGER GANGLION CYST EXCISION     INCISION AND DRAINAGE ABSCESS     Staph infection with multiple abdominal wounds s/p I&D in [place] ~ 2014   SHOULDER ARTHROSCOPY WITH OPEN ROTATOR CUFF REPAIR AND DISTAL CLAVICLE ACROMINECTOMY Right 09/09/2012   Procedure: Right Shoulder Arthroscopy with distal clavicle excision and mini-open rotator cuff repair.;  Surgeon: Karen Chafe  Berenice Primas, MD;  Location: [facility];  Service: Orthopedics;  Laterality: Right;   TONSILLECTOMY       Past OB/GYN History: OB History  Gravida Para Term Preterm AB Living  2         2  SAB IAB Ectopic Multiple Live Births          2    # Outcome Date GA Lbr Len/2nd Weight Sex Delivery Anes PTL Lv  2 Gravida           1 Gravida            Second baby was breech vaginal delivery S/p hysterectomy   Medications: She has a current medication list which includes the following prescription(s): accu-chek aviva plus, accu-chek softclix lancets, apple cider vinegar, biotin w/ vitamins c & e, calcium carb-cholecalciferol,  cetirizine, co-enzyme q-10, diclofenac sodium, fluticasone, gabapentin, ginger root, glipizide, levocarnitine fumarate, losartan-hydrochlorothiazide, metformin, metoprolol tartrate, mirabegron er, tart cherry advanced, misc. devices, multivitamin-lutein, eq estroblend menopause, omega-3 acid ethyl esters, pregabalin, pyridostigmine, rosuvastatin calcium, trulicity, turmeric curcumin, and vitamin d.   Allergies: Patient is allergic to celebrex [celecoxib], vioxx [rofecoxib], and latex.   Social History:  Social History   Tobacco Use   Smoking status: Never   Smokeless tobacco: Never  Vaping Use   Vaping Use: Never used  Substance Use Topics   Alcohol use: No   Drug use: No    Relationship status: married She lives with husband.   She is not employed. Regular exercise: No   Family History:   Family History  Problem Relation Age of Onset   Emphysema Father    Intracerebral hemorrhage Sister    Myasthenia gravis Neg Hx        none that she knows of      Review of Systems: Review of Systems  Constitutional:  Positive for malaise/fatigue and weight loss. Negative for fever.  Respiratory:  Positive for shortness of breath. Negative for cough and wheezing.   Cardiovascular:  Positive for leg swelling. Negative for chest pain and palpitations.  Gastrointestinal:  Positive for abdominal pain. Negative for blood in stool.  Genitourinary:  Negative for dysuria.  Musculoskeletal:  Positive for myalgias.  Skin:  Negative for rash.  Neurological:  Positive for dizziness and headaches.  Endo/Heme/Allergies:  Does not bruise/bleed easily.  Psychiatric/Behavioral:  Negative for depression. The patient is nervous/anxious.     OBJECTIVE Physical Exam: Vitals:   06/26/21 1322  BP: 124/75  Pulse: 67  Weight: 190 lb (86.2 kg)  Height: 5\' 2"  (1.575 m)    Physical Exam Constitutional:      General: She is not in acute distress. Pulmonary:     Effort: Pulmonary effort is normal.   Abdominal:     General: There is no distension.     Palpations: Abdomen is soft.     Tenderness: There is no abdominal tenderness. There is no rebound.  Musculoskeletal:        General: No swelling. Normal range of motion.  Skin:    General: Skin is warm and dry.     Findings: No rash.  Neurological:     Mental Status: She is alert and oriented to person, place, and time.  Psychiatric:        Mood and Affect: Mood normal.        Behavior: Behavior normal.     GU / Detailed Urogynecologic Evaluation:  Pelvic Exam: Normal external female genitalia; Bartholin's and Skene's glands normal in appearance;  urethral meatus normal in appearance, no urethral masses or discharge.   CST: negative  s/p hysterectomy: Speculum exam reveals normal vaginal mucosa with  atrophy and normal vaginal cuff.  Adnexa no mass, fullness, tenderness.     Pelvic floor strength I/V, puborectalis I/V external anal sphincter I/V  Pelvic floor musculature: Right levator non-tender, Right obturator non-tender, Left levator non-tender, Left obturator non-tender  POP-Q:   POP-Q  2                                            Aa   2                                           Ba  -5                                              C   5                                            Gh  4.5                                            Pb  8                                            tvl   0                                            Ap  0                                            Bp                                                 D     Rectal Exam:  Normal sphincter tone, small distal rectocele, enterocoele not present, no rectal masses, no sign of dyssynergia when asking the patient to bear down.  Post-Void Residual (PVR) by Bladder Scan: In order to evaluate bladder emptying, we discussed obtaining a postvoid residual and she agreed to this procedure.  Procedure: The ultrasound unit was placed on the  patient's abdomen in the suprapubic region after the patient had voided. A PVR of 10 ml was obtained by bladder scan.  Laboratory Results: POC urine: small leukocytes, neg nitrites   ASSESSMENT AND PLAN Ms. Zumwalt is a 76 y.o. with:  1. Overactive bladder   2. Urinary frequency   3. Incontinence of feces, unspecified fecal incontinence type   4. Prolapse of anterior vaginal wall   5. 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 SUI (stress urinary incontinence, female)    OAB - We discussed the symptoms of overactive bladder (OAB), which include urinary urgency, urinary frequency, nocturia, with or without urge incontinence.  While we do not know the exact etiology of OAB, several treatment options exist. We discussed management including behavioral therapy (decreasing bladder irritants, urge suppression strategies, timed voids, bladder retraining), physical therapy, medication. - Prescribed Myrbetriq 25mg  daily.  For Beta-3 agonist medication, we discussed the potential side effect of elevated blood pressure which is more likely to occur in individuals with uncontrolled hypertension.  2. Fecal incontinence - Treatment options include anti-diarrhea medication (loperamide/ Imodium OTC or prescription lomotil), fiber supplements, physical therapy, and possible sacral neuromodulation or surgery.   - She will start with fiber supplementation and imodium  3. Stage III anterior, Stage II posterior, Stage I apical prolapse - For treatment of pelvic organ prolapse, we discussed options for management including expectant management, conservative management, and surgical management, such as Kegels, a pessary, pelvic floor physical therapy, and specific surgical procedures. - She is unsure if she wants to pursue any treatment at this time  4. SUI - not as bothersome, will focus on urge incontinence symptoms first  Jaquita Folds, MD

## 2021-06-26 NOTE — Patient Instructions (Addendum)
We discussed the symptoms of overactive bladder (OAB), which include urinary urgency, urinary frequency, night-time urination, with or without urge incontinence.  We discussed management including behavioral therapy (decreasing bladder irritants by following a bladder diet, urge suppression strategies, timed voids, bladder retraining), physical therapy, medication.  For Beta-3 agonist medication, we discussed the potential side effect of elevated blood pressure which is more likely to occur in individuals with uncontrolled hypertension. You were given an Rx for Myrbetriq 25 mg.  It can take a month to start working so give it time, but if you have bothersome side effects call sooner and we can try a different medication.  Call us if you have trouble filling the prescription or if it's not covered by your insurance.   You have a stage 3 (out of 4) prolapse.  We discussed the fact that it is not life threatening but there are several treatment options. For treatment of pelvic organ prolapse, we discussed options for management including expectant management, conservative management, and surgical management, such as Kegels, a pessary, pelvic floor physical therapy, and specific surgical procedures.    Accidental Bowel Leakage: Our goal is to achieve formed bowel movements daily or every-other-day without leakage.  You may need to try different combinations of the following options to find what works best for you.  Some management options include: Dietary changes (more leafy greens, vegetables and fruits; less processed foods) Fiber supplementation (Metamucil or something with psyllium as active ingredient) Over-the-counter imodium (tablets or liquid) to help solidify the stool and prevent leakage of stool.

## 2021-07-09 DIAGNOSIS — G4733 Obstructive sleep apnea (adult) (pediatric): Secondary | ICD-10-CM | POA: Diagnosis not present

## 2021-07-26 DIAGNOSIS — I10 Essential (primary) hypertension: Secondary | ICD-10-CM | POA: Diagnosis not present

## 2021-08-26 DIAGNOSIS — I10 Essential (primary) hypertension: Secondary | ICD-10-CM | POA: Diagnosis not present

## 2021-09-03 DIAGNOSIS — Z299 Encounter for prophylactic measures, unspecified: Secondary | ICD-10-CM | POA: Diagnosis not present

## 2021-09-03 DIAGNOSIS — K2970 Gastritis, unspecified, without bleeding: Secondary | ICD-10-CM | POA: Diagnosis not present

## 2021-09-03 DIAGNOSIS — G7000 Myasthenia gravis without (acute) exacerbation: Secondary | ICD-10-CM | POA: Diagnosis not present

## 2021-09-03 DIAGNOSIS — I10 Essential (primary) hypertension: Secondary | ICD-10-CM | POA: Diagnosis not present

## 2021-09-03 DIAGNOSIS — I779 Disorder of arteries and arterioles, unspecified: Secondary | ICD-10-CM | POA: Diagnosis not present

## 2021-09-10 NOTE — Progress Notes (Signed)
[facility] ?Return Visit ? ?SUBJECTIVE  ?History of Present Illness: ?[name] is a 76 y.o. female seen in follow-up for OAB, SUI and fecal incontinence. Plan at last visit was to start Myrbetriq '25mg'$  daily and fiber supplementation.  ? ?Has had improvement with the medication. Still having a little leakage each day. Not really bothered by her prolapse.  ? ?Has been using the metamucil. Makes her stool more solid. She is taking it every day.  ? ?Had a stomach virus a few weeks ago and had frequent diarrhea. She is now taking probiotics. Diarrhea has improved. ? ?Past Medical History: ?Patient  has a past medical history of Arthritis, Bursitis, Diabetes mellitus without complication ([place]), Dyspnea, Fibromyalgia, GERD (gastroesophageal reflux disease), Hyperlipemia, Hypertension, Myasthenia gravis ([place]), Sinusitis, Sleep apnea, Sleep apnea, and Wears glasses.  ? ?Past Surgical History: ?She  has a past surgical history that includes Cervical fusion (2007); Tonsillectomy; Abdominal hysterectomy (1999); Colonoscopy; Breast surgery (2002); Cholecystectomy (2005); Finger ganglion cyst excision; Shoulder arthroscopy with open rotator cuff repair and distal clavicle acrominectomy (Right, 09/09/2012); and Incision and drainage abscess.  ? ?Medications: ?She has a current medication list which includes the following prescription(s): accu-chek aviva plus, accu-chek softclix lancets, apple cider vinegar, biotin w/ vitamins c & e, calcium carb-cholecalciferol, cetirizine, co-enzyme q-10, diclofenac sodium, fluticasone, gabapentin, ginger root, glipizide, levocarnitine fumarate, losartan-hydrochlorothiazide, metformin, metoprolol tartrate, mirabegron er, tart cherry advanced, misc. devices, multivitamin-lutein, eq estroblend menopause, omega-3 acid ethyl esters, pregabalin, pyridostigmine, rosuvastatin calcium, trulicity, turmeric curcumin, and vitamin d.  ? ?Allergies: ?Patient is allergic to celebrex  [celecoxib], vioxx [rofecoxib], and latex.  ? ?Social History: ?Patient  reports that she has never smoked. She has never used smokeless tobacco. She reports that she does not drink alcohol and does not use drugs.  ?  ?  ?OBJECTIVE  ?  ? ?Physical Exam: ?Vitals:  ? 09/11/21 1015  ?BP: 122/71  ?Pulse: 69  ? ?Gen: No apparent distress, A&O x 3. ? ?Detailed Urogynecologic Evaluation:  ?Deferred.   ? ?ASSESSMENT AND PLAN  ?  ?[name] is a 76 y.o. with:  ?1. Overactive bladder   ?2. Incontinence of feces, unspecified fecal incontinence type   ? ?- Will increase Myrbetriq to '50mg'$  daily since she is still having some leakage but has seen improvement.  ?- Continue with daily metamucil for bowel leakage.  ? ?Return 6 months or sooner if needed ? ?Jaquita Folds, MD ? ?

## 2021-09-11 ENCOUNTER — Encounter: Payer: Self-pay | Admitting: Obstetrics and Gynecology

## 2021-09-11 ENCOUNTER — Ambulatory Visit: Payer: PPO | Admitting: Obstetrics and Gynecology

## 2021-09-11 VITALS — BP 122/71 | HR 69

## 2021-09-11 DIAGNOSIS — R159 Full incontinence of feces: Secondary | ICD-10-CM

## 2021-09-11 DIAGNOSIS — N3281 Overactive bladder: Secondary | ICD-10-CM

## 2021-09-11 MED ORDER — MIRABEGRON ER 50 MG PO TB24: 50.0000 mg | ORAL_TABLET | Freq: Every day | ORAL | 5 refills | Status: DC

## 2021-09-15 DIAGNOSIS — W57XXXA Bitten or stung by nonvenomous insect and other nonvenomous arthropods, initial encounter: Secondary | ICD-10-CM | POA: Diagnosis not present

## 2021-09-15 DIAGNOSIS — S70361A Insect bite (nonvenomous), right thigh, initial encounter: Secondary | ICD-10-CM | POA: Diagnosis not present

## 2021-09-20 DIAGNOSIS — L84 Corns and callosities: Secondary | ICD-10-CM | POA: Diagnosis not present

## 2021-09-20 DIAGNOSIS — B351 Tinea unguium: Secondary | ICD-10-CM | POA: Diagnosis not present

## 2021-09-20 DIAGNOSIS — M79676 Pain in unspecified toe(s): Secondary | ICD-10-CM | POA: Diagnosis not present

## 2021-09-20 DIAGNOSIS — E1142 Type 2 diabetes mellitus with diabetic polyneuropathy: Secondary | ICD-10-CM | POA: Diagnosis not present

## 2021-09-25 DIAGNOSIS — I10 Essential (primary) hypertension: Secondary | ICD-10-CM | POA: Diagnosis not present

## 2021-09-27 DIAGNOSIS — I779 Disorder of arteries and arterioles, unspecified: Secondary | ICD-10-CM | POA: Diagnosis not present

## 2021-09-27 DIAGNOSIS — Z299 Encounter for prophylactic measures, unspecified: Secondary | ICD-10-CM | POA: Diagnosis not present

## 2021-09-27 DIAGNOSIS — I10 Essential (primary) hypertension: Secondary | ICD-10-CM | POA: Diagnosis not present

## 2021-09-27 DIAGNOSIS — E1165 Type 2 diabetes mellitus with hyperglycemia: Secondary | ICD-10-CM | POA: Diagnosis not present

## 2021-10-09 DIAGNOSIS — G4733 Obstructive sleep apnea (adult) (pediatric): Secondary | ICD-10-CM | POA: Diagnosis not present

## 2021-10-25 DIAGNOSIS — I10 Essential (primary) hypertension: Secondary | ICD-10-CM | POA: Diagnosis not present

## 2021-11-26 DIAGNOSIS — I10 Essential (primary) hypertension: Secondary | ICD-10-CM | POA: Diagnosis not present

## 2021-12-20 DIAGNOSIS — E1142 Type 2 diabetes mellitus with diabetic polyneuropathy: Secondary | ICD-10-CM | POA: Diagnosis not present

## 2021-12-20 DIAGNOSIS — L84 Corns and callosities: Secondary | ICD-10-CM | POA: Diagnosis not present

## 2021-12-20 DIAGNOSIS — M79676 Pain in unspecified toe(s): Secondary | ICD-10-CM | POA: Diagnosis not present

## 2021-12-20 DIAGNOSIS — B351 Tinea unguium: Secondary | ICD-10-CM | POA: Diagnosis not present

## 2021-12-25 DIAGNOSIS — M25371 Other instability, right ankle: Secondary | ICD-10-CM | POA: Diagnosis not present

## 2021-12-26 DIAGNOSIS — I10 Essential (primary) hypertension: Secondary | ICD-10-CM | POA: Diagnosis not present

## 2022-01-11 DIAGNOSIS — G4733 Obstructive sleep apnea (adult) (pediatric): Secondary | ICD-10-CM | POA: Diagnosis not present

## 2022-01-15 DIAGNOSIS — D692 Other nonthrombocytopenic purpura: Secondary | ICD-10-CM | POA: Diagnosis not present

## 2022-01-15 DIAGNOSIS — I10 Essential (primary) hypertension: Secondary | ICD-10-CM | POA: Diagnosis not present

## 2022-01-15 DIAGNOSIS — E118 Type 2 diabetes mellitus with unspecified complications: Secondary | ICD-10-CM | POA: Diagnosis not present

## 2022-01-15 DIAGNOSIS — E7800 Pure hypercholesterolemia, unspecified: Secondary | ICD-10-CM | POA: Diagnosis not present

## 2022-01-15 DIAGNOSIS — Z299 Encounter for prophylactic measures, unspecified: Secondary | ICD-10-CM | POA: Diagnosis not present

## 2022-01-25 DIAGNOSIS — I10 Essential (primary) hypertension: Secondary | ICD-10-CM | POA: Diagnosis not present

## 2022-02-14 DIAGNOSIS — E119 Type 2 diabetes mellitus without complications: Secondary | ICD-10-CM | POA: Diagnosis not present

## 2022-02-25 DIAGNOSIS — I10 Essential (primary) hypertension: Secondary | ICD-10-CM | POA: Diagnosis not present

## 2022-03-12 NOTE — Progress Notes (Unsigned)
[facility] Return Visit  SUBJECTIVE  History of Present Illness: [name] is a 76 y.o. female seen in follow-up for overactive bladder. Plan at last visit was increase Myrbetriq to '50mg'$  daily and continue metamucil for bowel incontinence.     Past Medical History: Patient  has a past medical history of Arthritis, Bursitis, Diabetes mellitus without complication ([place]), Dyspnea, Fibromyalgia, GERD (gastroesophageal reflux disease), Hyperlipemia, Hypertension, Myasthenia gravis ([place]), Sinusitis, Sleep apnea, Sleep apnea, and Wears glasses.   Past Surgical History: She  has a past surgical history that includes Cervical fusion (2007); Tonsillectomy; Abdominal hysterectomy (1999); Colonoscopy; Breast surgery (2002); Cholecystectomy (2005); Finger ganglion cyst excision; Shoulder arthroscopy with open rotator cuff repair and distal clavicle acrominectomy (Right, 09/09/2012); and Incision and drainage abscess.   Medications: She has a current medication list which includes the following prescription(s): accu-chek aviva plus, accu-chek softclix lancets, apple cider vinegar, biotin w/ vitamins c & e, calcium carb-cholecalciferol, cetirizine, co-enzyme q-10, diclofenac sodium, fluticasone, gabapentin, ginger root, glipizide, levocarnitine fumarate, losartan-hydrochlorothiazide, metformin, metoprolol tartrate, mirabegron er, tart cherry advanced, misc. devices, multivitamin-lutein, eq estroblend menopause, omega-3 acid ethyl esters, pregabalin, pyridostigmine, rosuvastatin calcium, trulicity, turmeric curcumin, and vitamin d.   Allergies: Patient is allergic to celebrex [celecoxib], vioxx [rofecoxib], and latex.   Social History: Patient  reports that she has never smoked. She has never used smokeless tobacco. She reports that she does not drink alcohol and does not use drugs.      OBJECTIVE     Physical Exam: There were no vitals filed for this visit. Gen: No apparent  distress, A&O x 3.  Detailed Urogynecologic Evaluation:  Deferred. Prior exam showed:      No data to display             ASSESSMENT AND PLAN    [name] is a 76 y.o. with:  No diagnosis found.  OAB:  FI:

## 2022-03-14 ENCOUNTER — Encounter: Payer: Self-pay | Admitting: Obstetrics and Gynecology

## 2022-03-14 ENCOUNTER — Ambulatory Visit: Payer: PPO | Admitting: Obstetrics and Gynecology

## 2022-03-14 VITALS — BP 131/78 | HR 68

## 2022-03-14 DIAGNOSIS — N952 Postmenopausal atrophic vaginitis: Secondary | ICD-10-CM | POA: Diagnosis not present

## 2022-03-14 DIAGNOSIS — N993 Prolapse of vaginal vault after hysterectomy: Secondary | ICD-10-CM

## 2022-03-14 DIAGNOSIS — R159 Full incontinence of feces: Secondary | ICD-10-CM | POA: Diagnosis not present

## 2022-03-14 DIAGNOSIS — N3281 Overactive bladder: Secondary | ICD-10-CM | POA: Diagnosis not present

## 2022-03-14 MED ORDER — ESTRADIOL 7.5 MCG/24HR VA RING: 1.0000 | VAGINAL_RING | VAGINAL | 5 refills | Status: DC

## 2022-03-14 NOTE — Patient Instructions (Signed)
Please bring e-string to next visit and we will place it when we check the pessary. You will pick this up from the pharmacy.

## 2022-03-15 ENCOUNTER — Ambulatory Visit: Payer: PPO | Admitting: Obstetrics and Gynecology

## 2022-03-28 DIAGNOSIS — M25511 Pain in right shoulder: Secondary | ICD-10-CM | POA: Diagnosis not present

## 2022-04-02 DIAGNOSIS — M6281 Muscle weakness (generalized): Secondary | ICD-10-CM | POA: Diagnosis not present

## 2022-04-02 DIAGNOSIS — M25511 Pain in right shoulder: Secondary | ICD-10-CM | POA: Diagnosis not present

## 2022-04-02 NOTE — Progress Notes (Unsigned)
[facility]   Subjective:     Chief Complaint: No chief complaint on file.  History of Present Illness: [name] is a 76 y.o. female with {PFD symptoms:24771} who presents for a pessary check. She is using a size *** {pessary type:24772} pessary. The pessary has been working well and she has no complaints. She {ACTION; IS/IS [id number]} using vaginal estrogen. She denies vaginal bleeding.  Past Medical History: Patient  has a past medical history of Arthritis, Bursitis, Diabetes mellitus without complication ([place]), Dyspnea, Fibromyalgia, GERD (gastroesophageal reflux disease), Hyperlipemia, Hypertension, Myasthenia gravis ([place]), Sinusitis, Sleep apnea, Sleep apnea, and Wears glasses.   Past Surgical History: She  has a past surgical history that includes Cervical fusion (2007); Tonsillectomy; Abdominal hysterectomy (1999); Colonoscopy; Breast surgery (2002); Cholecystectomy (2005); Finger ganglion cyst excision; Shoulder arthroscopy with open rotator cuff repair and distal clavicle acrominectomy (Right, 09/09/2012); and Incision and drainage abscess.   Medications: She has a current medication list which includes the following prescription(s): accu-chek aviva plus, accu-chek softclix lancets, apple cider vinegar, biotin w/ vitamins c & e, calcium carb-cholecalciferol, cetirizine, co-enzyme q-10, diclofenac sodium, estradiol, fluticasone, gabapentin, ginger root, glipizide, levocarnitine fumarate, losartan-hydrochlorothiazide, metformin, metoprolol tartrate, mirabegron er, tart cherry advanced, misc. devices, multivitamin-lutein, eq estroblend menopause, omega-3 acid ethyl esters, pregabalin, pyridostigmine, rosuvastatin calcium, trulicity, turmeric curcumin, and vitamin d.   Allergies: Patient is allergic to celebrex [celecoxib], vioxx [rofecoxib], and latex.   Social History: Patient  reports that she has never smoked. She has never used smokeless tobacco. She reports  that she does not drink alcohol and does not use drugs.      Objective:    Physical Exam: There were no vitals taken for this visit. Gen: No apparent distress, A&O x 3. Detailed Urogynecologic Evaluation:  Pelvic Exam: Normal external female genitalia; Bartholin's and Skene's glands normal in appearance; urethral meatus {urethra:24773}, no urethral masses or discharge. The pessary was noted to be {in place:24774}. It was removed and cleaned. Speculum exam revealed {vaginal lesions:24775} in the vagina. The pessary was replaced. It was comfortable to the patient and fit well.       No data to display          Laboratory Results: Urine dipstick shows: {ua dip:315374::"negative for all components"}.    Assessment/Plan:    Assessment: [name] is a 76 y.o. with {PFD symptoms:24771} here for a pessary check. She is doing well.  Plan: She will {pessary plan:24776}. She will continue to use {lubricant:24777}. She will follow-up in *** {days/wks/mos/yrs:310907} for a pessary check or sooner as needed.  All questions were answered.   Time Spent:

## 2022-04-04 ENCOUNTER — Encounter: Payer: Self-pay | Admitting: Obstetrics and Gynecology

## 2022-04-04 ENCOUNTER — Ambulatory Visit (INDEPENDENT_AMBULATORY_CARE_PROVIDER_SITE_OTHER): Payer: PPO | Admitting: Obstetrics and Gynecology

## 2022-04-04 VITALS — BP 103/63 | HR 66

## 2022-04-04 DIAGNOSIS — N952 Postmenopausal atrophic vaginitis: Secondary | ICD-10-CM

## 2022-04-04 DIAGNOSIS — N8110 Cystocele, unspecified: Secondary | ICD-10-CM | POA: Diagnosis not present

## 2022-04-04 DIAGNOSIS — N3281 Overactive bladder: Secondary | ICD-10-CM | POA: Diagnosis not present

## 2022-04-04 DIAGNOSIS — N816 Rectocele: Secondary | ICD-10-CM

## 2022-04-04 DIAGNOSIS — N993 Prolapse of vaginal vault after hysterectomy: Secondary | ICD-10-CM | POA: Diagnosis not present

## 2022-04-04 NOTE — Patient Instructions (Signed)
You can get another e-string and bring it to the 3 month appointment  Use a diaper rash cream or vitamin D cream for your skin around the perineum that is red.

## 2022-04-05 DIAGNOSIS — M25511 Pain in right shoulder: Secondary | ICD-10-CM | POA: Diagnosis not present

## 2022-04-05 DIAGNOSIS — M6281 Muscle weakness (generalized): Secondary | ICD-10-CM | POA: Diagnosis not present

## 2022-04-09 DIAGNOSIS — M6281 Muscle weakness (generalized): Secondary | ICD-10-CM | POA: Diagnosis not present

## 2022-04-09 DIAGNOSIS — M25511 Pain in right shoulder: Secondary | ICD-10-CM | POA: Diagnosis not present

## 2022-04-11 DIAGNOSIS — M6281 Muscle weakness (generalized): Secondary | ICD-10-CM | POA: Diagnosis not present

## 2022-04-11 DIAGNOSIS — M25511 Pain in right shoulder: Secondary | ICD-10-CM | POA: Diagnosis not present

## 2022-04-13 DIAGNOSIS — G4733 Obstructive sleep apnea (adult) (pediatric): Secondary | ICD-10-CM | POA: Diagnosis not present

## 2022-04-13 DIAGNOSIS — E669 Obesity, unspecified: Secondary | ICD-10-CM | POA: Diagnosis not present

## 2022-04-13 DIAGNOSIS — Z6833 Body mass index (BMI) 33.0-33.9, adult: Secondary | ICD-10-CM | POA: Diagnosis not present

## 2022-04-13 DIAGNOSIS — I10 Essential (primary) hypertension: Secondary | ICD-10-CM | POA: Diagnosis not present

## 2022-04-13 DIAGNOSIS — J019 Acute sinusitis, unspecified: Secondary | ICD-10-CM | POA: Diagnosis not present

## 2022-04-16 DIAGNOSIS — M6281 Muscle weakness (generalized): Secondary | ICD-10-CM | POA: Diagnosis not present

## 2022-04-16 DIAGNOSIS — M25511 Pain in right shoulder: Secondary | ICD-10-CM | POA: Diagnosis not present

## 2022-04-18 DIAGNOSIS — M25511 Pain in right shoulder: Secondary | ICD-10-CM | POA: Diagnosis not present

## 2022-04-18 DIAGNOSIS — M6281 Muscle weakness (generalized): Secondary | ICD-10-CM | POA: Diagnosis not present

## 2022-04-18 DIAGNOSIS — R42 Dizziness and giddiness: Secondary | ICD-10-CM | POA: Diagnosis not present

## 2022-04-18 DIAGNOSIS — H8110 Benign paroxysmal vertigo, unspecified ear: Secondary | ICD-10-CM | POA: Diagnosis not present

## 2022-04-25 DIAGNOSIS — M6281 Muscle weakness (generalized): Secondary | ICD-10-CM | POA: Diagnosis not present

## 2022-04-25 DIAGNOSIS — H8110 Benign paroxysmal vertigo, unspecified ear: Secondary | ICD-10-CM | POA: Diagnosis not present

## 2022-04-25 DIAGNOSIS — M25511 Pain in right shoulder: Secondary | ICD-10-CM | POA: Diagnosis not present

## 2022-04-25 DIAGNOSIS — R42 Dizziness and giddiness: Secondary | ICD-10-CM | POA: Diagnosis not present

## 2022-04-26 DIAGNOSIS — I10 Essential (primary) hypertension: Secondary | ICD-10-CM | POA: Diagnosis not present

## 2022-04-30 DIAGNOSIS — H8110 Benign paroxysmal vertigo, unspecified ear: Secondary | ICD-10-CM | POA: Diagnosis not present

## 2022-04-30 DIAGNOSIS — M25511 Pain in right shoulder: Secondary | ICD-10-CM | POA: Diagnosis not present

## 2022-04-30 DIAGNOSIS — M6281 Muscle weakness (generalized): Secondary | ICD-10-CM | POA: Diagnosis not present

## 2022-04-30 DIAGNOSIS — R42 Dizziness and giddiness: Secondary | ICD-10-CM | POA: Diagnosis not present

## 2022-05-02 DIAGNOSIS — H8110 Benign paroxysmal vertigo, unspecified ear: Secondary | ICD-10-CM | POA: Diagnosis not present

## 2022-05-02 DIAGNOSIS — R42 Dizziness and giddiness: Secondary | ICD-10-CM | POA: Diagnosis not present

## 2022-05-02 DIAGNOSIS — M25511 Pain in right shoulder: Secondary | ICD-10-CM | POA: Diagnosis not present

## 2022-05-02 DIAGNOSIS — M6281 Muscle weakness (generalized): Secondary | ICD-10-CM | POA: Diagnosis not present

## 2022-05-07 DIAGNOSIS — M25511 Pain in right shoulder: Secondary | ICD-10-CM | POA: Diagnosis not present

## 2022-05-07 DIAGNOSIS — M6281 Muscle weakness (generalized): Secondary | ICD-10-CM | POA: Diagnosis not present

## 2022-05-07 DIAGNOSIS — R42 Dizziness and giddiness: Secondary | ICD-10-CM | POA: Diagnosis not present

## 2022-05-07 DIAGNOSIS — H8110 Benign paroxysmal vertigo, unspecified ear: Secondary | ICD-10-CM | POA: Diagnosis not present

## 2022-05-09 DIAGNOSIS — H8110 Benign paroxysmal vertigo, unspecified ear: Secondary | ICD-10-CM | POA: Diagnosis not present

## 2022-05-09 DIAGNOSIS — M67911 Unspecified disorder of synovium and tendon, right shoulder: Secondary | ICD-10-CM | POA: Diagnosis not present

## 2022-05-09 DIAGNOSIS — M25511 Pain in right shoulder: Secondary | ICD-10-CM | POA: Diagnosis not present

## 2022-05-09 DIAGNOSIS — M6281 Muscle weakness (generalized): Secondary | ICD-10-CM | POA: Diagnosis not present

## 2022-05-09 DIAGNOSIS — R42 Dizziness and giddiness: Secondary | ICD-10-CM | POA: Diagnosis not present

## 2022-05-14 DIAGNOSIS — I10 Essential (primary) hypertension: Secondary | ICD-10-CM | POA: Diagnosis not present

## 2022-05-14 DIAGNOSIS — E119 Type 2 diabetes mellitus without complications: Secondary | ICD-10-CM | POA: Diagnosis not present

## 2022-05-14 DIAGNOSIS — I340 Nonrheumatic mitral (valve) insufficiency: Secondary | ICD-10-CM | POA: Diagnosis not present

## 2022-05-14 DIAGNOSIS — G4733 Obstructive sleep apnea (adult) (pediatric): Secondary | ICD-10-CM | POA: Diagnosis not present

## 2022-05-14 DIAGNOSIS — E039 Hypothyroidism, unspecified: Secondary | ICD-10-CM | POA: Diagnosis not present

## 2022-05-14 DIAGNOSIS — M25511 Pain in right shoulder: Secondary | ICD-10-CM | POA: Diagnosis not present

## 2022-05-14 DIAGNOSIS — E782 Mixed hyperlipidemia: Secondary | ICD-10-CM | POA: Diagnosis not present

## 2022-05-14 DIAGNOSIS — I6529 Occlusion and stenosis of unspecified carotid artery: Secondary | ICD-10-CM | POA: Diagnosis not present

## 2022-05-14 DIAGNOSIS — I739 Peripheral vascular disease, unspecified: Secondary | ICD-10-CM | POA: Diagnosis not present

## 2022-05-15 DIAGNOSIS — R059 Cough, unspecified: Secondary | ICD-10-CM | POA: Diagnosis not present

## 2022-05-15 DIAGNOSIS — J329 Chronic sinusitis, unspecified: Secondary | ICD-10-CM | POA: Diagnosis not present

## 2022-05-15 DIAGNOSIS — Z20822 Contact with and (suspected) exposure to covid-19: Secondary | ICD-10-CM | POA: Diagnosis not present

## 2022-05-21 DIAGNOSIS — R42 Dizziness and giddiness: Secondary | ICD-10-CM | POA: Diagnosis not present

## 2022-05-21 DIAGNOSIS — H8110 Benign paroxysmal vertigo, unspecified ear: Secondary | ICD-10-CM | POA: Diagnosis not present

## 2022-05-21 DIAGNOSIS — M25511 Pain in right shoulder: Secondary | ICD-10-CM | POA: Diagnosis not present

## 2022-05-21 DIAGNOSIS — M6281 Muscle weakness (generalized): Secondary | ICD-10-CM | POA: Diagnosis not present

## 2022-05-23 DIAGNOSIS — M25511 Pain in right shoulder: Secondary | ICD-10-CM | POA: Diagnosis not present

## 2022-05-23 DIAGNOSIS — M67911 Unspecified disorder of synovium and tendon, right shoulder: Secondary | ICD-10-CM | POA: Diagnosis not present

## 2022-05-23 DIAGNOSIS — R42 Dizziness and giddiness: Secondary | ICD-10-CM | POA: Diagnosis not present

## 2022-05-23 DIAGNOSIS — M6281 Muscle weakness (generalized): Secondary | ICD-10-CM | POA: Diagnosis not present

## 2022-05-23 DIAGNOSIS — H8110 Benign paroxysmal vertigo, unspecified ear: Secondary | ICD-10-CM | POA: Diagnosis not present

## 2022-05-27 DIAGNOSIS — I10 Essential (primary) hypertension: Secondary | ICD-10-CM | POA: Diagnosis not present

## 2022-05-28 DIAGNOSIS — M25511 Pain in right shoulder: Secondary | ICD-10-CM | POA: Diagnosis not present

## 2022-05-28 DIAGNOSIS — M6281 Muscle weakness (generalized): Secondary | ICD-10-CM | POA: Diagnosis not present

## 2022-05-28 DIAGNOSIS — R42 Dizziness and giddiness: Secondary | ICD-10-CM | POA: Diagnosis not present

## 2022-05-28 DIAGNOSIS — H8110 Benign paroxysmal vertigo, unspecified ear: Secondary | ICD-10-CM | POA: Diagnosis not present

## 2022-05-29 NOTE — Progress Notes (Unsigned)
[facility]  [name]: MERCER [name] DOB: 03-01-1946   REASON FOR VISIT: Follow-up for ocular myasthenia HISTORY FROM: [name]   HISTORY:  [name] is a 77 y.o. 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 Leonie [name] with initial visit 02/2016    Consult 03/07/16 Dr. Leonie [name];  [name] is a 40 year pleasant Caucasian lady whose had droopy eyelids first started in December 2016 when [name] stated states that "I was drooping and [name] will barely open it. [name] was seen by primary physician who thought [name] had partial Bell's palsy. This did not get better and subsequently in June [name] saw ophthalmologist who noticed that [name] had some infection on the lower eyelid treated with antibiotics. Since then [name] started that [name] has intermittent opening of both eyelids left more than right. There are times when arises is completely shut and on other times [name] can see well. In fact on inquiry [name] states that a couple of occasion while driving [name] was seeing double the dividing lines of the road for crossing over. [name] is also noticed that [name] gets fatigued and tired very easily. Initially when this first began this was thought to be related to viral infection but this does not seem to be getting better. [name] denies any difficulty moving her eyes, any dysphagia, weakness in her arms or legs. [name] has no trouble getting out, chair or climbing up and down steps. [name] has a good appetite and [name] had not lost any weight. Review of electronic medical record shows that [name] had elevated acetylcholine receptor antibodies on 01/15/16 with acetylcholine binding antibody of 6.16, blocking antibody 31 and modulating antibody 48 all of which are abnormal. [name] has not been tried on Mestinon on other medications for myasthenia. [name] denies any history of headaches, strokes, TIAs or other neurologic problems.  Update 1/16/2018PS : [name] returns for follow-up after  last visit 2 months ago. [name] states [name] has noticed benefit on Mestinon and eyes do not droop as much and [name] is not had any episodes of diplopia. [name] doesn't feel tired as much as well. However [name] was unable to tolerate 60 mg dose of Mestinon due to stomach cramps, nausea and is taking only 30 mg mostly 3 times a day and takes an occasional fourth dose if needed. [name] did undergo nerve conduction study with rapid repetitive stimulation on 04/10/16 which was negative for decremental response. The [name] is reluctant to consider prednisone given her diabetes and risk of blood sugars being elevated on it  UPDATE 04/19/2018CM [name], 77 year old female returns for follow-up with history of ocular myasthenia. [name] is currently on Mestinon sometimes taking a full pill but mostly half pills due to nausea. [name] is a diabetic so [name] does not want to take prednisone. [name] has had no double vision blurred vision or any drooping of her left eye. Vision today 20/50 right 20/40 left.[name] denies any difficulty moving her eyes, any dysphagia, weakness in her arms or legs. [name] has no trouble getting out, chair or climbing up and down steps.  [name] continues to be active. [name] returns for reevaluation. [name] is requesting something for nausea UPDATE 10/17/2018CM  [name], 77 year old female returns for follow-up with a history of ocular myasthenia with symptoms beginning in June 2016. [name] denies any episodes of diplopia, no ptosis seen today. Vision today 2070 bilaterally. [name] denies any weakness in her arms or legs or any problems with breathing [name] continues to  be quite active. [name] continues to drive without difficulty [name] had 1 recent fall no injury.  [name] had a recent visit to her ophthalmologist. [name] is currently on Mestinon sometimes takes a full pill and sometimes a half pill. [name] has nausea but  Zofran did not help, [name] returns for reevaluation. Recently diagnosed with arthritis. Update 08/19/2017 :[name] returns for follow-up  after last visit 6 months ago. [name] states her cholesterol myasthenia symptoms are well controlled. [name] denies any drooping of the eyelids, diplopia, muscle fatigue or weakness. [name] states [name] is tired all the time but this may be related to sleep apnea and lack of exercise. [name] is currently on Mestinon 30 mg 4 times daily and is tolerating it quite well. [name] was unable to tolerate 60 mg due to diarrhea and upset stomach. [name] has no new complaints today. [name] does complain of some intermittent trouble swallowing when [name] thinks [name] swallows too quickly at times gets stuck in the middle of the chest. [name] denies any nasal regurg, nasal twang to her speech heart food being stuck in the back of her mouth.[name] states her blood pressure and sugar control have been quite good. 10/25/2019 : [name] returns for follow-up after last visit 2 years ago.  [name] states [name] was doing fine with her ptosis and myasthenia until 2 weeks ago when [name] is noticed increased droopiness of the left eye particularly towards the afternoon.  In the mornings her eyes seem much more open.  [name] called Dr. Jannifer [name] who recommended increasing her Mestinon which [name] was taking 30 mg 4 times daily to 60 mg 3 times daily.  [name] has not noticed any specific benefit.  [name] does have mild diarrhea and nausea but is tolerating it otherwise well.  [name] in the past has not been able to tolerate higher doses of Mestinon.  Dr. Jimmye [name] did discuss adding prednisone but the [name] is reluctant to try this because of her diabetes and worsening sugars.  [name] denies any weakness in her extremities, significant fatigue or tiredness.  [name] denies any recent respiratory illness in the form of infection.  [name] did lose her balance 1 [name] tripped but did not hurt herself.  [name] does have some intermittent diplopia particularly with sustained upgaze.    Update 12/21/2019 JM: [name] returns today for 102-monthfollow-up regarding ocular myasthenia After prior visit,  complaints of worsening left eye drooping Dr. SLeonie Manrecommended increasing Mestinon dosage to 60 mg 4 times daily Reports improvement of left eye symptoms but continues to have some droopiness especially when outdoors or in bright light and diplopia if both eyes are open Side effects of diarrhea and nausea with benefit with use of Imodium and a " nausea medication" that PCP started her on -[name] is unable to state name [name] is frustrated regarding continued symptoms and [name] has not been able to return to driving Denies any extremity weakness, fatigue or imbalance [name] was reluctantly placed on prednisone in July due to severe back and hip pain Reports benefit of left eye symptoms and did not notice impact on her glucose levels No further concerns  Update 03/28/2020 Dr. SLeonie [name] [name] returns for follow-up after last visit with [facility] practitioner 3 months ago.  [name] continues to have mild drooping of the left eye which is intermittent and more pronounced when [name] is tired and exhausted or excited.  [name] is taking Mestinon 60 mg 4 times daily but is having some trouble with diarrhea and upset stomach and  occasional cramps particularly during the early afternoon.  [name] has refused to take prednisone because of the long-term side effects.  [name] had a flareup of her back pain in August and was treated with a short course of prednisone which [name] feels transiently helped her myasthenic symptoms.  [name] also had a flareup of her sinus infection in October and took another short course of prednisone.  [name] plans to have cataract surgery soon.  [name] has given up driving because of fear of her eye closure.  Update 07/20/2020 JM: Returns for 4 months follow-up regarding ocular myasthenia  Continues to experience mild left eye drooping and diplopia but some improvement since prior that Currently on Mestinon '60mg'$  3 times daily - reports improvement of diarrhea, nausea and cramps after decreasing from 4 times to 3 times  daily dosing -thankfully symptoms have not worsened but actually improved  S/p cataract 12/2 right eye; 12/22 left eye Has been more sensitive to light since then  No new concerns at this time  Update 05/30/2021 JM:  Returns for follow-up visit after prior visit almost 1 year ago.  Reports improvement since prior visit. Only minimal left eye drooping currently and intermittent diplopia which can worsen after looking at something for too long or with low light. [name] has even returned back to driving without difficulty.  Continues on Mestinon 60 mg 3 times daily. Continued GI side effects but these have been stable. Continued use of Imodium PRN with benefit.    Update 05/30/2022 JM: [name] returns for 1 year follow-up.  Continues on Mestinon 60 mg 3 times daily Continue GI side effects, use of Imodium PRN       REVIEW OF SYSTEMS: Full 14 system review of systems performed and notable only for those listed, all others are neg:  Intermittent diplopia, drooping of the left eyelid only. All systems negative  ALLERGIES: Allergies  Allergen Reactions   Celebrex [Celecoxib] Hives and Swelling    SWELLING REACTION UNSPECIFIED    Vioxx [Rofecoxib] Hives   Latex Rash    HOME MEDICATIONS: Outpatient Medications Prior to Visit  Medication Sig Dispense Refill   ACCU-CHEK AVIVA PLUS test strip Apply 1 strip topically daily. for testing  1   ACCU-CHEK SOFTCLIX LANCETS lancets TEST DAILY E11.9  1   APPLE CIDER VINEGAR PO Take 1 tablet by mouth daily.      Biotin w/ Vitamins C & E (HAIR/SKIN/NAILS PO) Take 1 tablet by mouth daily.     Calcium Carb-Cholecalciferol 600-800 MG-UNIT TABS Take 1 tablet by mouth daily.     cetirizine (ZYRTEC) 10 MG tablet Take 10 mg by mouth at bedtime.   0   Co-Enzyme Q-10 100 MG CAPS Take 100 mg by mouth daily.      diclofenac sodium (VOLTAREN) 1 % GEL Apply 1 application topically 3 (three) times daily as needed (arthritis).      estradiol (ESTRING) 7.5 MCG/24HR  vaginal ring Place 1 each vaginally every 3 (three) months. 1 each 5   fluticasone (FLONASE) 50 MCG/ACT nasal spray Place 2 sprays into both nostrils at bedtime.      gabapentin (NEURONTIN) 300 MG capsule Take 300 mg by mouth at bedtime.      Ginger, Zingiber officinalis, (GINGER ROOT) 550 MG CAPS Take 550 mg by mouth daily.      glipiZIDE (GLUCOTROL XL) 10 MG 24 hr tablet Take 10 mg by mouth 2 (two) times daily.      levOCARNitine Fumarate 500 MG TABS Take 500  mg by mouth daily.      losartan-hydrochlorothiazide (HYZAAR) 100-25 MG tablet Take 1 tablet by mouth every evening.      metFORMIN (GLUCOPHAGE-XR) 500 MG 24 hr tablet Take 1,000 mg by mouth 2 (two) times daily with a meal.      metoprolol tartrate (LOPRESSOR) 25 MG tablet Take 12.5 mg by mouth daily.     mirabegron ER (MYRBETRIQ) 50 MG TB24 tablet Take 1 tablet (50 mg total) by mouth daily. 30 tablet 5   Misc Natural Products (TART CHERRY ADVANCED) CAPS Take 1 capsule by mouth 2 (two) times daily.      Misc. Devices MISC Avivia Plus lancets and strips. Tests daily  DX E11.9     multivitamin-lutein (OCUVITE-LUTEIN) CAPS capsule Take 1 capsule by mouth daily.     Nutritional Supplements (EQ ESTROBLEND MENOPAUSE) TABS Take 1 tablet by mouth every evening.      omega-3 acid ethyl esters (LOVAZA) 1 g capsule Take 1 g by mouth daily.     pregabalin (LYRICA) 50 MG capsule Take 50 mg by mouth 2 (two) times daily.      pyridostigmine (MESTINON) 60 MG tablet Take 1 tablet (60 mg total) by mouth 3 (three) times daily. 270 tablet 3   Rosuvastatin Calcium 10 MG CPSP Take 5 mg by mouth at bedtime.      TRULICITY 1.5 CB/7.6EG SOPN Inject 1.5 mLs into the skin once a week.     Turmeric Curcumin 500 MG CAPS Take 500 mg by mouth every evening.      VITAMIN D PO Take by mouth.     No facility-administered medications prior to visit.    PAST MEDICAL HISTORY: Past Medical History:  Diagnosis Date   Arthritis    Bursitis    Diabetes mellitus without  complication (HCC)    Dyspnea    Fibromyalgia    GERD (gastroesophageal reflux disease)    Hyperlipemia    Hypertension    Myasthenia gravis ([place])    ocular   Sinusitis    Sleep apnea    uses a cpap   Sleep apnea    Wears glasses     PAST SURGICAL HISTORY: Past Surgical History:  Procedure Laterality Date   ABDOMINAL HYSTERECTOMY  1999   BREAST SURGERY  2002   rt br bx   CERVICAL FUSION  2007   CHOLECYSTECTOMY  2005   lap choli   COLONOSCOPY     FINGER GANGLION CYST EXCISION     INCISION AND DRAINAGE ABSCESS     Staph infection with multiple abdominal wounds s/p I&D in [place] ~ 2014   SHOULDER ARTHROSCOPY WITH OPEN ROTATOR CUFF REPAIR AND DISTAL CLAVICLE ACROMINECTOMY Right 09/09/2012   Procedure: Right Shoulder Arthroscopy with distal clavicle excision and mini-open rotator cuff repair.;  Surgeon: Alta Corning, MD;  Location: [facility];  Service: Orthopedics;  Laterality: Right;   TONSILLECTOMY      FAMILY HISTORY: Family History  Problem Relation Age of Onset   Emphysema Father    Intracerebral hemorrhage Sister    Myasthenia gravis Neg Hx        none that [name] knows of     SOCIAL HISTORY: Social History   Socioeconomic History   Marital status: Married    Spouse name: Not on file   Number of children: Not on file   Years of education: Not on file   Highest education level: Not on file  Occupational History   Occupation: Retired  Tobacco Use   Smoking status: Never   Smokeless tobacco: Never  Vaping Use   Vaping Use: Never used  Substance and Sexual Activity   Alcohol use: No   Drug use: No   Sexual activity: Not Currently  Other Topics Concern   Not on file  Social History Narrative   Lives at home with husband   Right handed   Caffeine: maybe 1 cup/day   Social Determinants of Health   Financial Resource Strain: Not on file  Food Insecurity: Not on file  Transportation Needs: Not on file  Physical Activity: Not on file  Stress:  Not on file  Social Connections: Not on file  Intimate Partner Violence: Not on file     PHYSICAL EXAM  There were no vitals filed for this visit.   There is no height or weight on file to calculate BMI.  Generalized: Very pleasant elderly Caucasian lady,Obese female in no acute distress  Head: normocephalic and atraumatic,.   Neck: Supple, no carotid bruits  Cardiac: Regular rate rhythm, no murmur  Musculoskeletal: No deformity   Neurological examination   Mentation: Alert oriented to time, place, history taking. Attention span and concentration appropriate. Recent and remote memory intact.  Follows all commands speech and language fluent.  Cranial nerve II-XII: Pupils were equal round reactive to light extraocular movements were full, no nystagmus, left eye mild drooping at rest which increases with sustained upgaze for 1 minute.  No diplopia and ophthalmoplegia exam. Facial sensation and strength were normal. hearing was intact to finger rubbing bilaterally. Uvula tongue midline. head turning and shoulder shrug were normal and symmetric.Tongue protrusion into cheek strength was normal. Motor: normal bulk and tone, full strength in the BUE, BLE, except decreased weakness distally right  lower extremity,  fine finger movements normal Sensory: normal and symmetric to light touch, pinprick, and  Vibration, in the upper and lower extremities  Coordination: finger-nose-finger, heel-to-shin bilaterally, no dysmetria Reflexes: 1+ upper lower and symmetric, plantar responses were flexor bilaterally. Gait and Station: Rising up from seated position without assistance, normal stance,  moderate stride, good arm swing, smooth turning. Ambulates without assistive device     ASSESSMENT AND PLAN 35 year Caucasian lady with gradual onset drooping of eyelids in December 2016 due to ocular myasthenia. [name] has positive acetylcholine receptor antibodies. [name] has some GI intolerance to Mestinon but  stable on Imodium    Ocular myasthenia -Continue Mestinon 60 mg 3 times daily -refill provided, intolerant to higher doses -Continue Imodium as needed -Advised her to call with any worsening of symptoms or flare ups,  if this occurs, would recommend trial of prednisone as intolerant to higher dosages of Mestinon    Follow-up in 1 year or call earlier if needed    CC:  Medicine, [facility]   I spent 26 minutes of face-to-face and non-face-to-face time with [name].  This included previsit chart review, lab review, study review, order entry, electronic health record documentation, [name] education and discussion regarding ocular myasthenia, further treatment options and interventions and answered all other questions to [name] satisfaction  Frann Rider, [facility]  [facility] [address] [place], [place] [address]  Phone [phone] Fax [phone] Note: This document was prepared with digital dictation and possible smart phrase technology. Any transcriptional errors that result from this process are unintentional.

## 2022-05-30 ENCOUNTER — Encounter: Payer: Self-pay | Admitting: Adult Health

## 2022-05-30 ENCOUNTER — Ambulatory Visit: Payer: PPO | Admitting: Adult Health

## 2022-05-30 VITALS — BP 124/67 | HR 65 | Ht 62.0 in | Wt 190.0 lb

## 2022-05-30 DIAGNOSIS — G7000 Myasthenia gravis without (acute) exacerbation: Secondary | ICD-10-CM

## 2022-05-30 MED ORDER — PYRIDOSTIGMINE BROMIDE 60 MG PO TABS: 60.0000 mg | ORAL_TABLET | Freq: Three times a day (TID) | ORAL | 3 refills | Status: DC

## 2022-05-30 NOTE — Patient Instructions (Signed)
Continue Mestinon 60 mg 3 times daily - refill provided    Followup in the future with me in 1 year or call earlier if needed       Thank you for coming to see [place] at [facility]. I hope we have been able to provide you high quality care today.  You may receive a patient satisfaction survey over the next few weeks. We would appreciate your feedback and comments so that we may continue to improve ourselves and the health of our patients.

## 2022-06-04 DIAGNOSIS — M6281 Muscle weakness (generalized): Secondary | ICD-10-CM | POA: Diagnosis not present

## 2022-06-04 DIAGNOSIS — R42 Dizziness and giddiness: Secondary | ICD-10-CM | POA: Diagnosis not present

## 2022-06-04 DIAGNOSIS — M25511 Pain in right shoulder: Secondary | ICD-10-CM | POA: Diagnosis not present

## 2022-06-04 DIAGNOSIS — H8110 Benign paroxysmal vertigo, unspecified ear: Secondary | ICD-10-CM | POA: Diagnosis not present

## 2022-06-06 DIAGNOSIS — Z6835 Body mass index (BMI) 35.0-35.9, adult: Secondary | ICD-10-CM | POA: Diagnosis not present

## 2022-06-06 DIAGNOSIS — Z1272 Encounter for screening for malignant neoplasm of vagina: Secondary | ICD-10-CM | POA: Diagnosis not present

## 2022-06-06 DIAGNOSIS — H8110 Benign paroxysmal vertigo, unspecified ear: Secondary | ICD-10-CM | POA: Diagnosis not present

## 2022-06-06 DIAGNOSIS — R42 Dizziness and giddiness: Secondary | ICD-10-CM | POA: Diagnosis not present

## 2022-06-06 DIAGNOSIS — M6281 Muscle weakness (generalized): Secondary | ICD-10-CM | POA: Diagnosis not present

## 2022-06-06 DIAGNOSIS — Z1231 Encounter for screening mammogram for malignant neoplasm of breast: Secondary | ICD-10-CM | POA: Diagnosis not present

## 2022-06-06 DIAGNOSIS — Z124 Encounter for screening for malignant neoplasm of cervix: Secondary | ICD-10-CM | POA: Diagnosis not present

## 2022-06-06 DIAGNOSIS — L987 Excessive and redundant skin and subcutaneous tissue: Secondary | ICD-10-CM | POA: Diagnosis not present

## 2022-06-06 DIAGNOSIS — Z1151 Encounter for screening for human papillomavirus (HPV): Secondary | ICD-10-CM | POA: Diagnosis not present

## 2022-06-06 DIAGNOSIS — M25511 Pain in right shoulder: Secondary | ICD-10-CM | POA: Diagnosis not present

## 2022-06-18 DIAGNOSIS — M25511 Pain in right shoulder: Secondary | ICD-10-CM | POA: Diagnosis not present

## 2022-06-26 DIAGNOSIS — I10 Essential (primary) hypertension: Secondary | ICD-10-CM | POA: Diagnosis not present

## 2022-06-27 DIAGNOSIS — Z299 Encounter for prophylactic measures, unspecified: Secondary | ICD-10-CM | POA: Diagnosis not present

## 2022-06-27 DIAGNOSIS — J020 Streptococcal pharyngitis: Secondary | ICD-10-CM | POA: Diagnosis not present

## 2022-06-27 DIAGNOSIS — I779 Disorder of arteries and arterioles, unspecified: Secondary | ICD-10-CM | POA: Diagnosis not present

## 2022-07-04 ENCOUNTER — Encounter: Payer: Self-pay | Admitting: Obstetrics and Gynecology

## 2022-07-04 ENCOUNTER — Ambulatory Visit (INDEPENDENT_AMBULATORY_CARE_PROVIDER_SITE_OTHER): Payer: PPO | Admitting: Obstetrics and Gynecology

## 2022-07-04 VITALS — BP 129/77 | HR 58

## 2022-07-04 DIAGNOSIS — N813 Complete uterovaginal prolapse: Secondary | ICD-10-CM | POA: Diagnosis not present

## 2022-07-04 DIAGNOSIS — N952 Postmenopausal atrophic vaginitis: Secondary | ICD-10-CM | POA: Diagnosis not present

## 2022-07-04 DIAGNOSIS — Z4689 Encounter for fitting and adjustment of other specified devices: Secondary | ICD-10-CM

## 2022-07-04 MED ORDER — ESTRADIOL 0.1 MG/GM VA CREA: 0.5000 g | TOPICAL_CREAM | VAGINAL | 11 refills | Status: DC

## 2022-07-04 NOTE — Patient Instructions (Signed)
Do not use cream until after next visit.

## 2022-07-04 NOTE — Progress Notes (Addendum)
[facility]   Subjective:     Chief Complaint:  Chief Complaint  Patient presents with   Pessary Check    [name] is a 77 y.o. female is here for pessary check.   History of Present Illness: [name] is a 77 y.o. female with stage III pelvic organ prolapse who presents for a pessary check. She is using a size #3 long stem gellhorn pessary. The pessary has been working well and she has no complaints. She is using vaginal estrogen via E-string. She denies vaginal bleeding.  Reports when she went to get her E-string today it was 200$   She also endorses an OAB accident in which she urinated on herself. She reports she was given some sweet tea instead of unsweet and believes this was a trigger for her.   Past Medical History: Patient  has a past medical history of Arthritis, Bursitis, Diabetes mellitus without complication (HCC), Dyspnea, Fibromyalgia, GERD (gastroesophageal reflux disease), Hyperlipemia, Hypertension, Myasthenia gravis (HCC), Sinusitis, Sleep apnea, Sleep apnea, and Wears glasses.   Past Surgical History: She  has a past surgical history that includes Cervical fusion (2007); Tonsillectomy; Abdominal hysterectomy (1999); Colonoscopy; Breast surgery (2002); Cholecystectomy (2005); Finger ganglion cyst excision; Shoulder arthroscopy with open rotator cuff repair and distal clavicle acrominectomy (Right, 09/09/2012); and Incision and drainage abscess.   Medications: She has a current medication list which includes the following prescription(s): apple cider vinegar, biotin w/ vitamins c & e, cetirizine, co-enzyme q-10, diclofenac sodium, estradiol, gabapentin, ginger root, glipizide, ipratropium, levocarnitine fumarate, losartan-hydrochlorothiazide, metformin, metoprolol tartrate, mirabegron er, tart cherry advanced, multivitamin-lutein, omega-3 acid ethyl esters, pregabalin, pyridostigmine, rosuvastatin calcium, turmeric curcumin, and vitamin d.    Allergies: Patient is allergic to celebrex [celecoxib], vioxx [rofecoxib], and latex.   Social History: Patient  reports that she has never smoked. She has never used smokeless tobacco. She reports that she does not drink alcohol and does not use drugs.      Objective:    Physical Exam: BP 129/77   Pulse (!) 58  Gen: No apparent distress, A&O x 3. Detailed Urogynecologic Evaluation:  Pelvic Exam: Normal external female genitalia; Bartholin's and Skene's glands normal in appearance; urethral meatus normal in appearance, no urethral masses or discharge. The pessary was noted to be in place. It was removed and cleaned. Speculum exam revealed no lesions in the vagina. The pessary was replaced. It was comfortable to the patient and fit well.    Assessment/Plan:    Assessment: Ms. Courtois is a 77 y.o. with stage III pelvic organ prolapse here for a pessary check. She is doing well.  Plan: She will keep the pessary in place until next visit. She will continue to keep E-string in place. She will follow-up in 3 months for a pessary check or sooner as needed.   As E-string is too expensive to continue will re-start patient on Estrogen cream. This is not ideal, but patient is unable to afford 200$ every three months.   All questions were answered.

## 2022-07-11 ENCOUNTER — Encounter: Payer: Self-pay | Admitting: Plastic Surgery

## 2022-07-11 ENCOUNTER — Ambulatory Visit (INDEPENDENT_AMBULATORY_CARE_PROVIDER_SITE_OTHER): Payer: PPO | Admitting: Plastic Surgery

## 2022-07-11 VITALS — BP 157/74 | HR 63 | Ht 61.5 in | Wt 197.0 lb

## 2022-07-11 DIAGNOSIS — L987 Excessive and redundant skin and subcutaneous tissue: Secondary | ICD-10-CM

## 2022-07-11 DIAGNOSIS — E65 Localized adiposity: Secondary | ICD-10-CM

## 2022-07-11 NOTE — Progress Notes (Signed)
Referring Provider Glenda Chroman, MD [address] [place],  [place] [address]   CC:  Chief Complaint  Patient presents with   Consult      [name] is an 77 y.o. female.  HPI: [name] very pleasant 77 year old female who presents today with complaints of excess skin and fat on her upper arms.  She is interested in removal of the skin and fat and tightening of the tissues.  She states that she finds it difficult to find close that fit around her upper arms.  This has been a problem for many years  Allergies  Allergen Reactions   Celebrex [Celecoxib] Hives and Swelling    SWELLING REACTION UNSPECIFIED    Vioxx [Rofecoxib] Hives   Latex Rash    Outpatient Encounter Medications as of 07/11/2022  Medication Sig   APPLE CIDER VINEGAR PO Take 1 tablet by mouth daily.    Biotin w/ Vitamins C & E (HAIR/SKIN/NAILS PO) Take 1 tablet by mouth daily.   cetirizine (ZYRTEC) 10 MG tablet Take 10 mg by mouth at bedtime.    Co-Enzyme Q-10 100 MG CAPS Take 100 mg by mouth daily.    diclofenac sodium (VOLTAREN) 1 % GEL Apply 1 application topically 3 (three) times daily as needed (arthritis).    estradiol (ESTRACE) 0.1 MG/GM vaginal cream Place 0.5 g vaginally 2 (two) times a week. Place 0.5g nightly for two weeks then twice a week after   gabapentin (NEURONTIN) 300 MG capsule Take 300 mg by mouth at bedtime.    Ginger, Zingiber officinalis, (GINGER ROOT) 550 MG CAPS Take 550 mg by mouth daily.    glipiZIDE (GLUCOTROL XL) 10 MG 24 hr tablet Take 10 mg by mouth 2 (two) times daily.    ipratropium (ATROVENT) 0.06 % nasal spray Place 2 sprays into both nostrils 3 (three) times daily.   levOCARNitine Fumarate 500 MG TABS Take 500 mg by mouth daily.    losartan-hydrochlorothiazide (HYZAAR) 100-25 MG tablet Take 1 tablet by mouth every evening.    metFORMIN (GLUCOPHAGE-XR) 500 MG 24 hr tablet Take 1,000 mg by mouth 2 (two) times daily with a meal.    metoprolol tartrate (LOPRESSOR) 25 MG tablet  Take 12.5 mg by mouth daily.   mirabegron ER (MYRBETRIQ) 50 MG TB24 tablet Take 1 tablet (50 mg total) by mouth daily.   Misc Natural Products (TART CHERRY ADVANCED) CAPS Take 1 capsule by mouth 2 (two) times daily.    multivitamin-lutein (OCUVITE-LUTEIN) CAPS capsule Take 1 capsule by mouth daily.   omega-3 acid ethyl esters (LOVAZA) 1 g capsule Take 1 g by mouth daily.   pregabalin (LYRICA) 50 MG capsule Take 50 mg by mouth 2 (two) times daily.    pyridostigmine (MESTINON) 60 MG tablet Take 1 tablet (60 mg total) by mouth 3 (three) times daily.   Rosuvastatin Calcium 10 MG CPSP Take 5 mg by mouth at bedtime.    Turmeric Curcumin 500 MG CAPS Take 500 mg by mouth every evening.    VITAMIN D PO Take by mouth.   No facility-administered encounter medications on file as of 07/11/2022.     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   Sinusitis  Sleep apnea    uses a cpap   Sleep apnea    Wears glasses     Past Surgical History:  Procedure Laterality Date   ABDOMINAL HYSTERECTOMY  1999   BREAST SURGERY  2002   rt br bx   CERVICAL FUSION  2007   CHOLECYSTECTOMY  2005   lap choli   COLONOSCOPY     FINGER GANGLION CYST EXCISION     INCISION AND DRAINAGE ABSCESS     Staph infection with multiple abdominal wounds s/p I&D in [place] ~ 2014   SHOULDER ARTHROSCOPY WITH OPEN ROTATOR CUFF REPAIR AND DISTAL CLAVICLE ACROMINECTOMY Right 09/09/2012   Procedure: Right Shoulder Arthroscopy with distal clavicle excision and mini-open rotator cuff repair.;  Surgeon: Alta Corning, MD;  Location: [facility];  Service: Orthopedics;  Laterality: Right;   TONSILLECTOMY      Family History  Problem Relation Age of Onset   Emphysema Father    Intracerebral hemorrhage Sister     Myasthenia gravis Neg Hx        none that she knows of     Social History   Social History Narrative   Lives at home with husband   Right handed   Caffeine: maybe 1 cup/day     Review of Systems General: Denies fevers, chills, weight loss CV: Denies chest pain, shortness of breath, palpitations Skin: Excess skin of the upper arms  Physical Exam    07/11/2022    1:32 PM 07/04/2022   11:30 AM 05/30/2022    1:48 PM  Vitals with BMI  Height 5' 1.5"  '5\' 2"'$   Weight 197 lbs  190 lbs  BMI [id number]  [id number]  Systolic [id number]  Diastolic 74 77 67  Pulse 63 58 65    General:  No acute distress,  Alert and oriented, Non-Toxic, Normal speech and affect Integument: Excess skin and fat over the triceps region Mammogram: No recent mammograms documented.  Last 2 years notes from her well woman visits mammograms are needed.  Will call the patient and offered to place a mammogram if she desires. Assessment/Plan Excess skin and fat of the upper arms: Discussed brachioplasty's at length with the patient and her husband.  She is a poor candidate at best for brachioplasty due to the significant amount of fat in the subcutaneous tissues that she still has.  Removal of her upper arm skin would not leave her with a particularly aesthetic result.  We discussed that if she is interested in brachioplasty that she would be much better off if she lost some weight first.  We talked about the importance of aerobic and resistance training.  She also understands the importance of diet in weight loss.  She is elected to defer surgery at this time but I have told her she is welcome to return at any point for reevaluation.  [name] 07/11/2022, 6:20 PM

## 2022-07-16 ENCOUNTER — Ambulatory Visit: Payer: PPO | Admitting: Internal Medicine

## 2022-07-16 ENCOUNTER — Encounter: Payer: Self-pay | Admitting: Internal Medicine

## 2022-07-16 VITALS — BP 144/86 | HR 59 | Temp 97.8°F | Ht 61.5 in | Wt 199.8 lb

## 2022-07-16 DIAGNOSIS — G7000 Myasthenia gravis without (acute) exacerbation: Secondary | ICD-10-CM

## 2022-07-16 DIAGNOSIS — R768 Other specified abnormal immunological findings in serum: Secondary | ICD-10-CM

## 2022-07-16 DIAGNOSIS — J302 Other seasonal allergic rhinitis: Secondary | ICD-10-CM | POA: Diagnosis not present

## 2022-07-16 DIAGNOSIS — R0609 Other forms of dyspnea: Secondary | ICD-10-CM | POA: Diagnosis not present

## 2022-07-16 DIAGNOSIS — J849 Interstitial pulmonary disease, unspecified: Secondary | ICD-10-CM | POA: Diagnosis not present

## 2022-07-16 LAB — CBC WITH DIFFERENTIAL/PLATELET
Basophils Absolute: 0.1 10*3/uL (ref 0.0–0.1)
Basophils Relative: 1.3 % (ref 0.0–3.0)
Eosinophils Absolute: 0.4 10*3/uL (ref 0.0–0.7)
Eosinophils Relative: 5.5 % — ABNORMAL HIGH (ref 0.0–5.0)
HCT: 34.9 % — ABNORMAL LOW (ref 36.0–46.0)
Hemoglobin: 12 g/dL (ref 12.0–15.0)
Lymphocytes Relative: 35.6 % (ref 12.0–46.0)
Lymphs Abs: 2.7 10*3/uL (ref 0.7–4.0)
MCHC: 34.3 g/dL (ref 30.0–36.0)
MCV: 93.8 fl (ref 78.0–100.0)
Monocytes Absolute: 0.7 10*3/uL (ref 0.1–1.0)
Monocytes Relative: 9.4 % (ref 3.0–12.0)
Neutro Abs: 3.6 10*3/uL (ref 1.4–7.7)
Neutrophils Relative %: 48.2 % (ref 43.0–77.0)
Platelets: 251 10*3/uL (ref 150.0–400.0)
RBC: 3.72 Mil/uL — ABNORMAL LOW (ref 3.87–5.11)
RDW: 12.6 % (ref 11.5–15.5)
WBC: 7.5 10*3/uL (ref 4.0–10.5)

## 2022-07-16 NOTE — Patient Instructions (Addendum)
ICD-10-CM   1. ILD (interstitial lung disease) ([place])  J84.9 Pulmonary function test    CT Chest High Resolution    2. Positive anti-CCP test  R76.8     3. Myasthenia gravis ([place])  G70.00     4. DOE (dyspnea on exertion)  R06.09     5. Seasonal allergies  J30.2         Last seend 2 years ago Interstitial lung diseas could be getting worse -  am concerned Also not sure if myasthenia is worse  Plan   -Do full PFT  - Do HRCT  - do ECHO  - get blood work RAST allergy panel and cbc with diff   -Continue to work on weight and myasthenia gravis   - talk to your relevant doctors about his  Follow-up -Return to see Dr. Chase Caller or APP in 4 weeks but after completing above

## 2022-07-16 NOTE — Progress Notes (Signed)
IOV 06/04/2016  Chief Complaint  Patient presents with   Advice Only    referred by Dr. Estanislado Pandy for ILD, abn ct.     77 year old female referred by Dr. Keturah Barre in the rheumatology clinic for evaluation of dyspnea in the setting of positive CCP and CT chest suggestive of ILD  History is taken from her and review of the outside chart of rheumatology and neurology January 2018  She is significantly obese although this is been stable for the last several years. Several years ago she developed knee issues and is wearing Crystal brace/shoulder and her right lower extremity. Since then she's been less mobile and less active. Corresponding with this she's had insidious onset of dyspnea on exertion for climbing stairs and other activities that are class II-III nature. This dyspnea has been stable all along without Crystal associated weight change. It is present with exertion relieved by rest. Is known orthopnea paroxysmal nocturnal dyspnea. She's had occasional associated dry cough but this is not consistent in the same with wheezing which is not consistent. There is no associated chest pain.  According to her history she's been seeing Dr. 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 According to her and review of the chart this is localized only to the ocular muscles she is on pyridostigmine. At this time Crystal high-resolution CT chest was done 04/27/2016 that I personally visualized that shows coronary artery calcification which is unaware of and also mild subtle ILD changes. She subsequently saw Dr. Blake Divine January 2018 and autoimmune profile showed elevated CCP that was isolated but no clinical or systemic evidence of rheumatoid arthritis. The diagnoses on the joints is still osteoarthritis. Therefore she's been referred here. There is no associated chest pain     review of the chart shows she's not had an echocardiogram Crystal  cardiac stress test btu she tells me she has one at [facility] with her cardioloigist 2 years ago was normal  Walking desaturation test 185 feet 3 laps on room air: Did not desaturate.  Results for Crystal, [name] (MRN [id number]) as of 06/04/2016 15:47  Ref. Range 01/15/2016 13:00  Acetylchol Block Ab Latest Ref Range: 0 - 25 % 31 (H)  Acety choline binding ab Latest Ref Range: 0.00 - 0.24 nmol/L 6.16 (H)  Acetylcholine Modulat Ab Latest Ref Range: 0 - 20 % 48 (H)   Results for Crystal, [name] (MRN [id number]) as of 06/04/2016 15:47  Ref. Range 05/02/2016 10:00  CK Total Latest Ref Range: 7 - 177 U/L 99  Sed Rate Latest Ref Range: 0 - 30 mm/hr 4  Anit Nuclear Antibody(ANA) Latest Ref Range: NEGATIVE  NEG  Angiotensin-Converting Enzyme Latest Ref Range: 9 - 67 U/L 34  Cyclic Citrullin Peptide Ab Latest Units: Units 109 (H)  ds DNA Ab Latest Units: IU/mL <1  Myeloperoxidase Abs Latest Ref Range: <1.0 AI  <1.0  Serine Protease 3 Latest Ref Range: <1.0 AI  <1.0  RA Latex Turbid. Latest Ref Range: <14 IU/mL <14  ENA SM Ab Ser-aCnc Latest Ref Range: <1.0 NEG AI  <1.0 NEG  Ribonucleic Protein(ENA) Antibody, IgG Latest Ref Range: <1.0 NEG AI  <1.0 NEG  SSA (Ro) (ENA) Antibody, IgG Latest Ref Range: <1.0 NEG AI  <1.0 NEG  SSB (La) (ENA) Antibody, IgG Latest Ref Range: <1.0 NEG AI  <1.0 NEG  Scleroderma (Scl-70) (ENA) Antibody, IgG Latest Ref Range: <1.0 NEG  AI  <1.0 NEG      OV 12/03/2016  Chief Complaint  Patient presents with   Follow-up    Pt states her breathing is unchanged since last OV. Pt denies significant cough, CP/tightness, f/c/s.    Follow-up shortness of breath that is multifactorial but associated with interstitial lung disease on CT scan of the chest December 2017 associated with positive CCP antibody and Crystal diagnosis of osteoarthritis but not rheumatoid arthritis in the joints  She continues to be stable. There are no interim issues no chest pain. She only has mild shortness of  breath with exertion. Her ocular myasthenia is improved. Pulmonary function test shows mild reduction in isolated diffusion capacity 74%. Forced vital capacity and lung volumes are normal. High-resolution CT chest shows subtle ILD in the bases non-UIP pattern.  CT chest high resolution IMPRESSION: 1. 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 2. Aortic atherosclerosis, in addition to left main and 3 vessel coronary artery disease. Please note that although the presence of coronary artery calcium documents the presence of coronary artery disease, the severity of this disease and Crystal potential stenosis cannot be assessed on this non-gated CT examination. Assessment for potential risk factor modification, dietary therapy or pharmacologic therapy may be warranted, if clinically indicated. 3. Mild hepatic steatosis. 4. Additional incidental findings, as above.   Aortic Atherosclerosis (ICD10-I70.0).     Electronically Signed   By: Vinnie Langton M.D.   On: 11/29/2016 10:15     OV 06/03/2017   Chief Complaint  Patient presents with   Follow-up    PFT done today.  Pt states her breathing is about the same as last visit.  Pt recently had Crystal sinus infection but is not currently on Crystal meds. Has complaints of Crystal cough with green mucus. Denies Crystal CP.    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  In the setting of myasthenia gravis  Follow-up coronary artery calcification  Last visit August 2018.  Overall stable since then.  She reports significant dyspnea when climbing stairs but otherwise doing well.  Overall stable.  And walking desaturation test she got very tachycardic even though she did not desaturate.  She is known to have coronary  artery calcification and I referred her to cardiology but today she tells me that she follows for this at [facility] with Dr. Claudie Leach and her stress test is upcoming.  She has never been to pulmonary rehabilitation.  There is no associated chest pain or cough.  Follow pulmonary function test shows continued stability but perhaps reduction in DLCO.  On exam she has no crackles.     Walking desaturation test on 06/03/2017 185 feet x 3 laps on ROOM AIR:  did NOT desaturate. Rest pulse ox was 100%, final pulse ox was 99%. HR response 75/min at rest to 112/min at peak exertion. Patient [name]  Did ot Desaturate < 88% . [name] did not  Desaturated </= 3% points. [name] yes did get tachyardic   OV 03/03/2018  Subjective:  Patient ID: [name], female , DOB: 08-21-1945 , age 37 y.o. , MRN: [id number] , ADDRESS: [place] [id number]   03/03/2018 -   Chief Complaint  Patient presents with   Follow-up  pt c/o sob with exertion.  denies cough, chest congestion.     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  In the setting of myasthenia gravis  Also coronary arter calcification  -     HPI ANYA WIENEKE 77 y.o. -returns for follow-up of ILD not otherwise specified.  After last visit she did see her cardiologist.  According to her there is no discussion about the coronary artery calcification but she recollects Crystal normal stress test with the last few to several years.  She started attending pulmonary rehabilitation for the last few to several months.  She is been doing well with this and improved dyspnea.  Overall she is stable.  She had high-resolution CT chest in October 2019 that is unchanged from February 2018.  The pattern is described as either indeterminate or alternative diagnosis.  This seems to  be some craniocaudal gradient with reticulations and traction bronchiectasis.  However there is some air trapping as well.  She is attending pulmonary rehabilitation and this is improved her dyspnea.  However the etiology of her ILD still unknown.  She did see Dr. D rheumatologist January 30, 2018 and has been advised that she has osteoarthritis based on my chart review.  She is on as needed follow-up.  Patient is wondering about the etiology of her ILD and is wondering about the safety of surgical lung biopsy.  Did Crystal history retake and she did admit to having RAYNAUD phenomenon mild in the last few winters of her fingers.  T    Results for TASA, SADD (MRN [id number]) as of 06/03/2017 11:52  Ref. Range 12/03/2016 09:51 06/03/2017 10:48  FVC-Pre Latest Units: L 2.66 2.66  FVC-%Pred-Pre Latest Units: % 92 93   Results for ADALINN, CROCHET (MRN [id number]) as of 06/03/2017 11:52  Ref. Range 12/03/2016 09:51 06/03/2017 10:48  DLCO unc Latest Units: ml/min/mmHg 17.17 16.35  DLCO unc % pred Latest Units: % 74 71     HRCT 01/30/18 - OMPARISON:  11/29/2016 high-resolution chest CT.   FINDINGS: Lungs/Pleura: No pneumothorax. No pleural effusion. No acute consolidative airspace disease, lung masses or significant pulmonary nodules. There is mild patchy subpleural reticulation and ground-glass attenuation in both lungs with associated mild traction bronchiectasis. There is Crystal basilar gradient to these findings. No frank honeycombing. Mild patchy air trapping in both lungs on the expiration sequence. Interval stability of these findings.   Upper abdomen: Cholecystectomy.   Musculoskeletal: No aggressive appearing focal osseous lesions. Marked thoracic spondylosis. Partially visualized surgical hardware from ACDF in the lower cervical spine.   IMPRESSION: 1. Continued stability of basilar predominant fibrotic interstitial lung disease without frank honeycombing. Findings are considered most  compatible with an alternative diagnosis to UIP per ATS consensus criteria. 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 2. Mild patchy air trapping in both lungs, indicative of small airways disease. 3. Three-vessel coronary atherosclerosis.   Aortic Atherosclerosis (ICD10-I70.0).     Electronically Signed   By: Ilona Sorrel M.D.   On: 01/30/2018 08:07    IMPRESSION: Mild incomplete relaxation of the cricopharyngeus, but otherwise normal for age esophagram.     Electronically Signed   By: Genevie Ann M.D.   On: 02/10/2018 10:  OV 06/02/2018  Subjective:  Patient ID: [name], female , DOB: [birth year] , age 59 y.o. , MRN: [id number] , ADDRESS: [place] [address]  06/02/2018 -   Chief Complaint  Patient presents with   Follow-up    PFT performed today.  Pt states she has been doing good since last visit and denies Crystal complaints.    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  In the setting of myasthenia gravis    HPI NICOLEANNE [name] 77 y.o. -reports for follow-up.  In the interim she has had no new problems.  She had pulmonary function test.  The FVC is stable/slight decline.  Similarly DLCO even though the percentage is higher the wrong number shows possible decline.  This is because of the new equation called GLI.[name]. question that we are using.  Because of various possibilities of ILD #29 visit we did extensive autoimmune panel and this was all negative.  Currently the only positive result is the CCP antibody which was 109 in January 2018.  I have not over connective tissue disease history again but she denies.  In fact this time I asked her about RAYNAUD again and at this time she denies.  There are no new issues.  We discussed about the need to do Crystal surgical lung biopsy.  She  is somewhat nervous because of obesity, sleep apnea and myasthenia gravis.  However, she is open to talking to the surgeon about it.  She is attending pulmonary rehabilitation and this helps her.       ROS - per HPI    OV 08/31/2019  Subjective:  Patient ID: [name], female , DOB: Apr 27, 1946 , age 14 y.o. , MRN: [id number] , ADDRESS: [place] [address]   08/31/2019 -   Chief Complaint  Patient presents with   Follow-up    doing ok, 1 year follow up    Follow-up interstitial lung disease not otherwise specified indeterminate for UIP associated with isolated  positive CCP antibody (109 Jan 2018) but clinical osteoarthritis but not rheumatoid arthritis.  In the setting of myasthenia gravis    HPI [name] 77 y.o. -6 presents for follow-up of her interstitial lung disease.  I last saw her in February 2020.  Referred to Dr. Roxan Hockey for surgical lung biopsy because of indeterminate UIP pattern.  She is scheduled the surgery within the pandemic broke out in March 2020 the surgery was canceled.  She has been isolating.  No new interim issues.  She has had Crystal Covid vaccine.  She never developed Covid.  Her dyspnea is stable.  Stairs make it more difficult.  Dyspnea is relieved by rest rest of the symptoms listed below.  She is still open to having surgical lung biopsy.  Pulmonary function test done shows decrease in FVC but increase in DLCO again suggesting technical issues.  Her walking desaturation test is normal.  She is attending pulmonary rehabilitation at [facility].     OV 01/13/2020   Subjective:  Patient ID: [name], female , DOB: 06-03-1945, age 43 y.o. years. 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  In the setting of myasthenia gravis  Also OSA on CPAP on room air   Chief  Complaint  Patient presents with   Follow-up    pt is sob on exertion       HPI [name] 77 y.o. -returns for follow-up.  Last seen in May 2021.  At the time wanted to restage her ILD.  Today she tells me she is overall stable but then her symptom score for dyspnea shows significant worsening of dyspnea.  In talking to her I learned that her myasthenia gravis is now in flareup.  She has seen neurology last seen by Frann Rider end of August 2021.  They have asked her to uptitrate her Mestinon which she is doing with this causing significant nausea.  She says that she still feels tired and has difficulty with vision and easily has ptosis.  She does use Crystal CPAP for her sleep apnea.  In terms of her lung function her FVC has declined 5% roughly since last year but the DLCO itself is stable.  Her high-resolution CT chest shows that her ILD itself is stable over nearly 2 years.  Her walking desaturation test shows tendency to drop pulse oximeter but this in the setting of both ILD and active myasthenia gravis.  ROS - per H IMPRESSION: High-resolution CT chest May 2021 Spectrum of findings compatible with basilar predominant fibrotic interstitial lung disease without frank honeycombing, without convincing interval progression since 01/29/2018 or since the baseline 04/10/2016 study. 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 Findings are indeterminate for UIP per consensus guidelines: Diagnosis of Idiopathic Pulmonary Fibrosis: An Official ATS/ERS/JRS/ALAT Clinical Practice Guideline. [place], Iss 5, [phone], Dec 28 2016.   Three-vessel coronary atherosclerosis.   Aortic Atherosclerosis (ICD10-I70.0).      Electronically Signed   By: Ilona Sorrel M.D.   On: 09/08/2019 15:40     OV 07/24/2020  Subjective:  Patient ID: [name], female , DOB: 1945-08-15 , age 60 y.o. , MRN: [id number] , ADDRESS: [place] [id number] PCP Medicine, [facility] Patient Care Team: Medicine, [facility] Internal as PCP - General (Internal Medicine)  This Provider for this visit: Treatment Team:  Attending Provider: Brand Males, MD    07/24/2020 -   Chief Complaint  Patient presents with   Follow-up    Follow up after PFT    HPI [name] 77 y.o. -returns for follow-up.  Since her last visit she says her myasthenia is better but still not fully improved in fact this evening sitting in the office she has left-sided ptosis.  She says she is on Crystal higher dose of Mestinon but is causing diarrhea as documented below.  But definitely dyspnea scores are better with improvement in the myasthenia gravis as documented below.  She had pulmonary function testing that shows the [facility] is better again showing improvement from myasthenia.  DLCO is the same suggesting pulmonary fibrosis itself is stable.  At this point in time she is just on supportive care and watchful waiting and serial monitoring.     OV 07/16/2022  Subjective:  Patient ID: [name], female , DOB: Feb 03, 1946 , age 20 y.o. , MRN: [id number] , ADDRESS: [place] [id number] PCP Glenda Chroman, MD Patient Care Team: Glenda Chroman, MD as PCP - General (Internal Medicine)  This Provider for this visit: Treatment Team:  Attending Provider: Brand Males, MD    07/16/2022 -   Chief Complaint  Patient presents with  Follow-up    Having trouble with allergies and sore throat, sob    Interstitial lung disease stable phenotype with positive CCP antibody History of myasthenia gravis on Mestinon [ocular myasthenia Dr. Frann Rider Morbidly obese  HPI [name] 77 y.o. -last seen 2 years  ago.  I saw her in March 2022 and she was supposed to see me back in 6 months for pulmonary function test but it is unclear why she has not followed up.  She is here because she says she is quite short of breath with exertion relieved by rest.  At variable time she said that she was stable in the last 2 years and she has had insidious onset of shortness of breath with exertion relieved by rest for Crystal long time and it is the same but she also admitted that recently it is "more difficult".  Symptoms course suggest worsening.  However walking desaturation test is stable.  She is also been dealing with shoulder issues and she underwent shoulder physical therapy.  Her last pulmonary function test and CT scan was over 2 years ago.   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  First established in 2017/2016.  Her most recent follow-up in February 2024 neurologist opinion was that she was stable although she does have some fatigue with bright [name] and she was continuing on Mestinon 60 mg 3 times daily.  She is also seeing OB/GYN for vaginal atrophy and plastic surgery for adiposity.  Review of the indicate she has not had her echocardiogram  She also told CMA she is having spring allergies  SYMPTOM SCALE - ILD 08/31/2019  01/13/2020  07/24/2020  07/16/2022   O2 use ra ra ra ra  Shortness of Breath 0 -> 5 scale with 5 being worst (score 6 If unable to do)     At rest 0 2 0 2  Simple tasks - showers, clothes change, eating, shaving 0 2 0 3  Household (dishes, doing bed, laundry) 0 3 2 2   Shopping 1 3 2 2   Walking level at own pace 2 3 1 3   Walking up Stairs 4 2 3 4   Total (30-36) Dyspnea Score 7 15 8 18   How bad is your cough? 1 1 2 2   How bad is your fatigue 4 4 4 4   How bad is nausea 0 4 2 1   How bad is vomiting?  0 0 2 1  How bad is diarrhea? 2 4 4  - mestinon 3  How bad is anxiety? 2 2 2 4   How  bad is depression 2 1 2 3          Simple office walk 185 feet x  3 laps goal with forehead probe 03/03/2018  06/02/2018  08/31/2019  01/13/2020  07/24/2020  07/16/2022   O2 used Room air Room air Room air ra ra ra  Number laps completed 3 3 3 3  3   Comments about pace normal normal normal  avg pace   Resting Pulse Ox/HR 100% and 66/min 98% and 72/min 99% and 74/min 98% and 62/min 98% and 76 97% and HR 61  Final Pulse Ox/HR 99% and 108/min 96% and 57/mn 98% and 106/min 91% and 74/min 99% and 119 975 and HR 111  Desaturated </= 88% no no no no no   Desaturated <= 3% points no no no Yes, 7 points no   Got Tachycardic >/= 90/min yes no  yes no    Symptoms at end of test x Mild dyspnea Dyspnea at 3rd lap No dyspnea Mild dysonea Mod dyspnea  Miscellaneous comments x           PFT     Latest Ref Rng & Units 07/24/2020    3:03 PM 08/10/2019   10:53 AM 06/02/2018   12:49 PM 06/03/2017   11:05 AM 12/03/2016    9:51 AM  PFT Results  FVC-Pre L 2.61  2.47  2.59  2.66  2.66  P  FVC-Predicted Pre % 94  88  91  93  92  P  FVC-Post L     2.49  P  FVC-Predicted Post %     86  P  Pre FEV1/FVC % % 90  92  92  89  90  P  Post FEV1/FCV % %     95  P  FEV1-Pre L 2.35  2.27  2.38  2.38  2.39  P  FEV1-Predicted Pre % 113  107  110  110  110  P  FEV1-Post L     2.35  P  DLCO uncorrected ml/min/mmHg 17.75  17.01  15.58  16.35  17.17  P  DLCO UNC% % 95  91  83  71  74  P  DLCO corrected ml/min/mmHg 17.75  17.52    18.34  P  DLCO COR %Predicted % 95  93    80  P  DLVA Predicted % 109  101  90  84  98  P  TLC L     4.43  P  TLC % Predicted %     90  P  RV % Predicted %     79  P    P Preliminary result       has Crystal past medical history of Arthritis, Bursitis, Diabetes mellitus without complication (HCC), Dyspnea, Fibromyalgia, GERD (gastroesophageal reflux disease), Hyperlipemia, Hypertension, Myasthenia gravis ([place]), Sinusitis, Sleep apnea, Sleep apnea, and Wears glasses.   reports that she has  never smoked. She has never used smokeless tobacco.  Past Surgical History:  Procedure Laterality Date   ABDOMINAL HYSTERECTOMY  1999   BREAST SURGERY  2002   rt br bx   CERVICAL FUSION  2007   CHOLECYSTECTOMY  2005   lap choli   COLONOSCOPY     FINGER GANGLION CYST EXCISION     INCISION AND DRAINAGE ABSCESS     Staph infection with multiple abdominal wounds s/p I&D in [place] ~ 2014   SHOULDER ARTHROSCOPY WITH OPEN ROTATOR CUFF REPAIR AND DISTAL CLAVICLE ACROMINECTOMY Right 09/09/2012   Procedure: Right Shoulder Arthroscopy with distal clavicle excision and mini-open rotator cuff repair.;  Surgeon: Alta Corning, MD;  Location: [facility];  Service: Orthopedics;  Laterality: Right;   TONSILLECTOMY      Allergies  Allergen Reactions   Celebrex [Celecoxib] Hives and Swelling    SWELLING REACTION UNSPECIFIED    Vioxx [Rofecoxib] Hives   Latex Rash    Immunization History  Administered Date(s) Administered   Fluad Quad(high Dose 65+) 01/28/2019   Influenza Split 02/02/2016   Influenza, High Dose Seasonal PF 03/08/2015, 01/27/2017, 02/20/2017, 01/27/2018, 01/13/2020   Influenza-Unspecified 02/25/2014   Moderna Sars-Covid-2 Vaccination 05/04/2019, 06/10/2019, 07/09/2019, 07/27/2019, 04/24/2020, 09/14/2020   Pneumococcal Conjugate-13 06/01/2015   Pneumococcal Polysaccharide-23 01/28/2012   Pneumococcal-Unspecified 06/04/2013   Tdap 04/29/2009, 11/13/2020   Zoster Recombinat (Shingrix) 12/06/2019    Family History  Problem Relation Age of  Onset   Emphysema Father    Intracerebral hemorrhage Sister    Myasthenia gravis Neg Hx        none that she knows of      Current Outpatient Medications:    APPLE CIDER VINEGAR PO, Take 1 tablet by mouth daily. , Disp: , Rfl:    Biotin w/ Vitamins C & [name] (HAIR/SKIN/NAILS PO), Take 1 tablet by mouth daily., Disp: , Rfl:    cetirizine (ZYRTEC) 10 MG tablet, Take 10 mg by mouth at bedtime. , Disp: , Rfl: 0   Co-Enzyme Q-10 100 MG  CAPS, Take 100 mg by mouth daily. , Disp: , Rfl:    diclofenac sodium (VOLTAREN) 1 % GEL, Apply 1 application topically 3 (three) times daily as needed (arthritis). , Disp: , Rfl:    estradiol (ESTRACE) 0.1 MG/GM vaginal cream, Place 0.5 g vaginally 2 (two) times Crystal week. Place 0.5g nightly for two weeks then twice Crystal week after, Disp: 30 g, Rfl: 11   gabapentin (NEURONTIN) 300 MG capsule, Take 300 mg by mouth at bedtime. , Disp: , Rfl:    Ginger, Zingiber officinalis, (GINGER ROOT) 550 MG CAPS, Take 550 mg by mouth daily. , Disp: , Rfl:    glipiZIDE (GLUCOTROL XL) 10 MG 24 hr tablet, Take 10 mg by mouth 2 (two) times daily. , Disp: , Rfl:    ipratropium (ATROVENT) 0.06 % nasal spray, Place 2 sprays into both nostrils 3 (three) times daily., Disp: , Rfl:    levOCARNitine Fumarate 500 MG TABS, Take 500 mg by mouth daily. , Disp: , Rfl:    losartan-hydrochlorothiazide (HYZAAR) 100-25 MG tablet, Take 1 tablet by mouth every evening. , Disp: , Rfl:    metFORMIN (GLUCOPHAGE-XR) 500 MG 24 hr tablet, Take 1,000 mg by mouth 2 (two) times daily with Crystal meal. , Disp: , Rfl:    metoprolol tartrate (LOPRESSOR) 25 MG tablet, Take 12.5 mg by mouth daily., Disp: , Rfl:    mirabegron ER (MYRBETRIQ) 50 MG TB24 tablet, Take 1 tablet (50 mg total) by mouth daily., Disp: 30 tablet, Rfl: 5   Misc Natural Products (TART CHERRY ADVANCED) CAPS, Take 1 capsule by mouth 2 (two) times daily. , Disp: , Rfl:    multivitamin-lutein (OCUVITE-LUTEIN) CAPS capsule, Take 1 capsule by mouth daily., Disp: , Rfl:    omega-3 acid ethyl esters (LOVAZA) 1 g capsule, Take 1 g by mouth daily., Disp: , Rfl:    pregabalin (LYRICA) 50 MG capsule, Take 50 mg by mouth 2 (two) times daily. , Disp: , Rfl:    pyridostigmine (MESTINON) 60 MG tablet, Take 1 tablet (60 mg total) by mouth 3 (three) times daily., Disp: 270 tablet, Rfl: 3   Rosuvastatin Calcium 10 MG CPSP, Take 5 mg by mouth at bedtime. , Disp: , Rfl:    Turmeric Curcumin 500 MG CAPS, Take  500 mg by mouth every evening. , Disp: , Rfl:    VITAMIN D PO, Take by mouth., Disp: , Rfl:       Objective:   Vitals:   07/16/22 0918  BP: (!) 144/86  Pulse: (!) 59  Temp: 97.8 F (36.6 C)  TempSrc: Oral  SpO2: 97%  Weight: 199 lb 12.8 oz (90.6 kg)  Height: 5' 1.5" (1.562 m)    Estimated body mass index is 37.14 kg/m as calculated from the following:   Height as of this encounter: 5' 1.5" (1.562 m).   Weight as of this encounter: 199 lb 12.8 oz (  90.6 kg).  @WEIGHTCHANGE @  [employer]   07/16/22 0918  Weight: 199 lb 12.8 oz (90.6 kg)     Physical ExamGeneral: No distress. obese Neuro: Alert and Oriented x 3. GCS 15. Speech normal Psych: Pleasant Resp:  Barrel Chest - no.  Wheeze - no, Crackles - no, No overt respiratory distress CVS: Normal heart sounds. Murmurs - no Ext: Stigmata of Connective Tissue Disease - no HEENT: Normal upper airway. PEERL +. No post nasal drip        Assessment:       ICD-10-CM   1. ILD (interstitial lung disease) ([place])  J84.9 Pulmonary function test    CT Chest High Resolution    2. Positive anti-CCP test  R76.8     3. Myasthenia gravis ([place])  G70.00     4. DOE (dyspnea on exertion)  R06.09     5. Seasonal allergies  J30.2          Plan:     Patient Instructions     ICD-10-CM   1. ILD (interstitial lung disease) ([place])  J84.9 Pulmonary function test    CT Chest High Resolution    2. Positive anti-CCP test  R76.8     3. Myasthenia gravis ([place])  G70.00     4. DOE (dyspnea on exertion)  R06.09     5. Seasonal allergies  J30.2         Last seend 2 years ago Interstitial lung diseas could be getting worse Also not sure if myasthenia is worse  Plan   -Do full PFT  - Do HRCT  - do ECHO  - get blood work RAST allergy panel and cbc with diff   -Continue to work on weight and myasthenia gravis   - talk to your relevant doctors about his  Follow-up -Return to see Dr. Chase Caller or APP in 4 weeks but  after completing above      SIGNATURE    Dr. Brand Males, M.D., F.C.C.P,  Pulmonary and Critical Care Medicine Staff Physician, [place] Director - Interstitial Lung Disease  Program  Pulmonary [place] [facility] [place], [place], [address]  Pager: [phone], If no answer or between  15:00h - 7:00h: call [phone] Telephone: [phone]  9:46 AM 07/16/2022  Moderate Complexity MDM  The table below is from the 2021 [name]/M guidelines, first released in 2021, with minor revisions added in 2023. Must meet the requirements for 2 out of 3 dimensions to qualify.    Number and complexity of problems addressed Amount and/or complexity of data reviewed Risk of complications and/or morbidity  One or more chronic illness with mild exacerbation, progression, or side effects of treatment  Two or more stable chronic illnesses  One undiagnosed new problem with uncertain prognosis  One acute illness with systemic symptoms   Acute complicated injury Must meet the requirements for 1 of 3 of the categories)  Category 1: Tests and documents, historian  Crystal combination of 3 of the following:  Assessment requiring an independent historian  Review of prior external records  Review of results of each unique test  Ordering of each unique test    Category 2: Interpretation of tests  Independent interpretation of Crystal test perfromed by another physician/NPP  Category 3: Discuss management/tests  Discussion of magagement or tests with an external physician/NPP Prescription drug management  Decision regarding minor surgery with identfied patient or procedure risk factors  Decision regarding elective major surgery without identified patient or procedure risk factors  Diagnosis or treatment significantly limited by social determinants of health

## 2022-07-21 LAB — ALLERGEN PROFILE, PERENNIAL ALLERGEN IGE

## 2022-07-27 DIAGNOSIS — I10 Essential (primary) hypertension: Secondary | ICD-10-CM | POA: Diagnosis not present

## 2022-07-30 DIAGNOSIS — M25511 Pain in right shoulder: Secondary | ICD-10-CM | POA: Diagnosis not present

## 2022-08-06 DIAGNOSIS — G4733 Obstructive sleep apnea (adult) (pediatric): Secondary | ICD-10-CM | POA: Diagnosis not present

## 2022-08-27 DIAGNOSIS — I10 Essential (primary) hypertension: Secondary | ICD-10-CM | POA: Diagnosis not present

## 2022-09-03 ENCOUNTER — Ambulatory Visit: Payer: PPO | Admitting: Internal Medicine

## 2022-09-04 ENCOUNTER — Emergency Department (HOSPITAL_COMMUNITY)
Admission: EM | Admit: 2022-09-04 | Discharge: 2022-09-04 | Disposition: A | Discharge status: Home / Self Care | Payer: PPO | Attending: Emergency Medicine | Admitting: Emergency Medicine

## 2022-09-04 ENCOUNTER — Other Ambulatory Visit: Payer: Self-pay

## 2022-09-04 ENCOUNTER — Emergency Department (HOSPITAL_COMMUNITY): Payer: PPO

## 2022-09-04 DIAGNOSIS — Z91040 Latex allergy status: Secondary | ICD-10-CM | POA: Diagnosis not present

## 2022-09-04 DIAGNOSIS — S0001XA Abrasion of scalp, initial encounter: Secondary | ICD-10-CM | POA: Insufficient documentation

## 2022-09-04 DIAGNOSIS — T1490XA Injury, unspecified, initial encounter: Secondary | ICD-10-CM | POA: Diagnosis not present

## 2022-09-04 DIAGNOSIS — S0990XA Unspecified injury of head, initial encounter: Secondary | ICD-10-CM | POA: Diagnosis not present

## 2022-09-04 DIAGNOSIS — W01198A Fall on same level from slipping, tripping and stumbling with subsequent striking against other object, initial encounter: Secondary | ICD-10-CM | POA: Diagnosis not present

## 2022-09-04 DIAGNOSIS — Z7984 Long term (current) use of oral hypoglycemic drugs: Secondary | ICD-10-CM | POA: Insufficient documentation

## 2022-09-04 DIAGNOSIS — I10 Essential (primary) hypertension: Secondary | ICD-10-CM | POA: Diagnosis not present

## 2022-09-04 DIAGNOSIS — R220 Localized swelling, mass and lump, head: Secondary | ICD-10-CM | POA: Diagnosis not present

## 2022-09-04 DIAGNOSIS — Z79899 Other long term (current) drug therapy: Secondary | ICD-10-CM | POA: Insufficient documentation

## 2022-09-04 DIAGNOSIS — E1165 Type 2 diabetes mellitus with hyperglycemia: Secondary | ICD-10-CM | POA: Insufficient documentation

## 2022-09-04 DIAGNOSIS — W19XXXA Unspecified fall, initial encounter: Secondary | ICD-10-CM

## 2022-09-04 LAB — CBG MONITORING, ED: Glucose-Capillary: 309 mg/dL — ABNORMAL HIGH (ref 70–99)

## 2022-09-04 NOTE — Discharge Instructions (Signed)
Note the workup today was overall reassuring.  Imaging studies were negative for any brain bleed or fracture.  Recommend use of Tylenol at home for pain.  Please do not hesitate to return to emergency department for worrisome signs and symptoms we discussed to become apparent.
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
## 2022-09-10 ENCOUNTER — Ambulatory Visit (HOSPITAL_COMMUNITY): Admission: RE | Admit: 2022-09-10 | Payer: PPO | Source: Ambulatory Visit

## 2022-09-11 ENCOUNTER — Telehealth: Payer: Self-pay | Admitting: *Deleted

## 2022-09-11 DIAGNOSIS — I10 Essential (primary) hypertension: Secondary | ICD-10-CM | POA: Diagnosis not present

## 2022-09-11 DIAGNOSIS — R5383 Other fatigue: Secondary | ICD-10-CM | POA: Diagnosis not present

## 2022-09-11 DIAGNOSIS — Z299 Encounter for prophylactic measures, unspecified: Secondary | ICD-10-CM | POA: Diagnosis not present

## 2022-09-11 DIAGNOSIS — W19XXXA Unspecified fall, initial encounter: Secondary | ICD-10-CM | POA: Diagnosis not present

## 2022-09-11 NOTE — Telephone Encounter (Signed)
Transition Care Management Unsuccessful Follow-up Telephone Call  Date of discharge and from where:  [name] 09/04/2022  Attempts:  1st Attempt  Reason for unsuccessful TCM follow-up call:  Left voice message

## 2022-09-12 ENCOUNTER — Telehealth: Payer: Self-pay | Admitting: *Deleted

## 2022-09-12 NOTE — Telephone Encounter (Signed)
Transition Care Management Unsuccessful Follow-up Telephone Call  Date of discharge and from where:  [facility]/04/[facility]  Attempts:  2nd Attempt  Reason for unsuccessful TCM follow-up call:  Left voice message

## 2022-09-15 NOTE — Progress Notes (Signed)
 This encounter was created in error - please disregard.

## 2022-09-15 NOTE — Patient Instructions (Signed)
ICD-10-CM   1. ILD (interstitial lung disease) ([facility])  J84.9 Pulmonary function test    CT Chest High Resolution    2. Positive anti-CCP test  R76.8     3. Myasthenia gravis ([facility])  G70.00     4. DOE (dyspnea on exertion)  R06.09     5. Seasonal allergies  J30.2         Last seend 2 years ago Interstitial lung diseas could be getting worse -  am concerned Also not sure if myasthenia is worse  Plan   -Do full PFT  - Do HRCT  - do ECHO  - get blood work RAST allergy panel and cbc with diff   -Continue to work on weight and myasthenia gravis   - talk to your relevant doctors about his  Follow-up -Return to see Dr. Timea Breed or APP in 4 weeks but after completing above   

## 2022-09-16 ENCOUNTER — Encounter: Payer: PPO | Admitting: Internal Medicine

## 2022-09-17 ENCOUNTER — Ambulatory Visit (HOSPITAL_COMMUNITY)
Admission: RE | Admit: 2022-09-17 | Discharge: 2022-09-17 | Disposition: A | Discharge status: Home / Self Care | Payer: PPO | Source: Ambulatory Visit | Attending: Internal Medicine | Admitting: Internal Medicine

## 2022-09-17 DIAGNOSIS — J849 Interstitial pulmonary disease, unspecified: Secondary | ICD-10-CM | POA: Diagnosis not present

## 2022-09-17 DIAGNOSIS — J479 Bronchiectasis, uncomplicated: Secondary | ICD-10-CM | POA: Diagnosis not present

## 2022-09-17 DIAGNOSIS — J8410 Pulmonary fibrosis, unspecified: Secondary | ICD-10-CM | POA: Diagnosis not present

## 2022-09-19 DIAGNOSIS — R413 Other amnesia: Secondary | ICD-10-CM | POA: Diagnosis not present

## 2022-09-19 DIAGNOSIS — I10 Essential (primary) hypertension: Secondary | ICD-10-CM | POA: Diagnosis not present

## 2022-09-19 DIAGNOSIS — Z299 Encounter for prophylactic measures, unspecified: Secondary | ICD-10-CM | POA: Diagnosis not present

## 2022-09-20 ENCOUNTER — Encounter: Payer: Self-pay | Admitting: Internal Medicine

## 2022-09-26 DIAGNOSIS — R413 Other amnesia: Secondary | ICD-10-CM | POA: Diagnosis not present

## 2022-09-26 DIAGNOSIS — R519 Headache, unspecified: Secondary | ICD-10-CM | POA: Diagnosis not present

## 2022-09-27 DIAGNOSIS — I10 Essential (primary) hypertension: Secondary | ICD-10-CM | POA: Diagnosis not present

## 2022-10-03 ENCOUNTER — Ambulatory Visit (INDEPENDENT_AMBULATORY_CARE_PROVIDER_SITE_OTHER): Payer: PPO | Admitting: Obstetrics and Gynecology

## 2022-10-03 ENCOUNTER — Encounter: Payer: Self-pay | Admitting: Obstetrics and Gynecology

## 2022-10-03 VITALS — BP 139/77 | HR 89

## 2022-10-03 DIAGNOSIS — N816 Rectocele: Secondary | ICD-10-CM

## 2022-10-03 DIAGNOSIS — N993 Prolapse of vaginal vault after hysterectomy: Secondary | ICD-10-CM

## 2022-10-03 DIAGNOSIS — Z4689 Encounter for fitting and adjustment of other specified devices: Secondary | ICD-10-CM

## 2022-10-03 DIAGNOSIS — N952 Postmenopausal atrophic vaginitis: Secondary | ICD-10-CM | POA: Diagnosis not present

## 2022-10-03 DIAGNOSIS — N8110 Cystocele, unspecified: Secondary | ICD-10-CM

## 2022-10-03 MED ORDER — ESTRADIOL 0.1 MG/GM VA CREA: 0.5000 g | TOPICAL_CREAM | VAGINAL | 11 refills | Status: DC

## 2022-10-03 NOTE — Progress Notes (Signed)
[facility]   Subjective:     Chief Complaint:  Chief Complaint  Patient presents with   Pessary Check    [name] is a 77 y.o. female her for a pessary check.    History of Present Illness: [name] is a 77 y.o. female with stage III pelvic organ prolapse who presents for a pessary check. She is using a size #3 long stem gellhorn pessary. The pessary has been working well and she has no complaints. She is using vaginal estrogen in the form of an estring. She denies vaginal bleeding.  Past Medical History: Patient  has a past medical history of Arthritis, Bursitis, Diabetes mellitus without complication (HCC), Dyspnea, Fibromyalgia, GERD (gastroesophageal reflux disease), Hyperlipemia, Hypertension, Myasthenia gravis (HCC), Sinusitis, Sleep apnea, Sleep apnea, and Wears glasses.   Past Surgical History: She  has a past surgical history that includes Cervical fusion (2007); Tonsillectomy; Abdominal hysterectomy (1999); Colonoscopy; Breast surgery (2002); Cholecystectomy (2005); Finger ganglion cyst excision; Shoulder arthroscopy with open rotator cuff repair and distal clavicle acrominectomy (Right, 09/09/2012); and Incision and drainage abscess.   Medications: She has a current medication list which includes the following prescription(s): apple cider vinegar, biotin w/ vitamins c & e, cetirizine, co-enzyme q-10, diclofenac sodium, estradiol, gabapentin, ginger root, glipizide, ipratropium, levocarnitine fumarate, losartan-hydrochlorothiazide, metformin, metoprolol tartrate, mirabegron er, tart cherry advanced, multivitamin-lutein, omega-3 acid ethyl esters, pregabalin, pyridostigmine, rosuvastatin calcium, turmeric curcumin, and vitamin d.   Allergies: Patient is allergic to celebrex [celecoxib], vioxx [rofecoxib], and latex.   Social History: Patient  reports that she has never smoked. She has never used smokeless tobacco. She reports that she does not  drink alcohol and does not use drugs.      Objective:    Physical Exam: BP 139/77   Pulse 89  Gen: No apparent distress, A&O x 3. Detailed Urogynecologic Evaluation:  Pelvic Exam: Normal external female genitalia; Bartholin's and Skene's glands normal in appearance; urethral meatus normal in appearance, no urethral masses or discharge. The pessary was noted to be in place. It was removed and cleaned. Speculum exam revealed no lesions in the vagina. The pessary was replaced. It was comfortable to the patient and fit well.     Assessment/Plan:    Assessment: [name] is a 77 y.o. with stage III pelvic organ prolapse here for a pessary check. She is doing well.  Plan: She will keep the pessary in place until next visit. She will continue to use estrogen cream. She will follow-up in 3 months for a pessary check or sooner as needed.  All questions were answered.

## 2022-10-03 NOTE — Patient Instructions (Addendum)
Do not get the Estring as it is too expensive.   I have sent you in a prescription for Estrogen cream. You are to do a blueberry sized amount into the vagina twice a week. This has been sent to [facility] in [place]. Do not use the applicator.

## 2022-10-27 DIAGNOSIS — I10 Essential (primary) hypertension: Secondary | ICD-10-CM | POA: Diagnosis not present

## 2022-10-29 DIAGNOSIS — D692 Other nonthrombocytopenic purpura: Secondary | ICD-10-CM | POA: Diagnosis not present

## 2022-10-29 DIAGNOSIS — Z299 Encounter for prophylactic measures, unspecified: Secondary | ICD-10-CM | POA: Diagnosis not present

## 2022-10-29 DIAGNOSIS — I779 Disorder of arteries and arterioles, unspecified: Secondary | ICD-10-CM | POA: Diagnosis not present

## 2022-10-29 DIAGNOSIS — E118 Type 2 diabetes mellitus with unspecified complications: Secondary | ICD-10-CM | POA: Diagnosis not present

## 2022-10-29 DIAGNOSIS — I10 Essential (primary) hypertension: Secondary | ICD-10-CM | POA: Diagnosis not present

## 2022-10-29 DIAGNOSIS — R42 Dizziness and giddiness: Secondary | ICD-10-CM | POA: Diagnosis not present

## 2022-11-05 DIAGNOSIS — R42 Dizziness and giddiness: Secondary | ICD-10-CM | POA: Diagnosis not present

## 2022-11-05 DIAGNOSIS — M6281 Muscle weakness (generalized): Secondary | ICD-10-CM | POA: Diagnosis not present

## 2022-11-05 DIAGNOSIS — H8110 Benign paroxysmal vertigo, unspecified ear: Secondary | ICD-10-CM | POA: Diagnosis not present

## 2022-11-05 DIAGNOSIS — M25511 Pain in right shoulder: Secondary | ICD-10-CM | POA: Diagnosis not present

## 2022-11-06 DIAGNOSIS — G4733 Obstructive sleep apnea (adult) (pediatric): Secondary | ICD-10-CM | POA: Diagnosis not present

## 2022-11-07 DIAGNOSIS — M6281 Muscle weakness (generalized): Secondary | ICD-10-CM | POA: Diagnosis not present

## 2022-11-07 DIAGNOSIS — R42 Dizziness and giddiness: Secondary | ICD-10-CM | POA: Diagnosis not present

## 2022-11-07 DIAGNOSIS — H8110 Benign paroxysmal vertigo, unspecified ear: Secondary | ICD-10-CM | POA: Diagnosis not present

## 2022-11-07 DIAGNOSIS — M25511 Pain in right shoulder: Secondary | ICD-10-CM | POA: Diagnosis not present

## 2022-11-11 DIAGNOSIS — E1165 Type 2 diabetes mellitus with hyperglycemia: Secondary | ICD-10-CM | POA: Diagnosis not present

## 2022-11-11 DIAGNOSIS — J020 Streptococcal pharyngitis: Secondary | ICD-10-CM | POA: Diagnosis not present

## 2022-11-11 DIAGNOSIS — E118 Type 2 diabetes mellitus with unspecified complications: Secondary | ICD-10-CM | POA: Diagnosis not present

## 2022-11-11 DIAGNOSIS — J029 Acute pharyngitis, unspecified: Secondary | ICD-10-CM | POA: Diagnosis not present

## 2022-11-11 DIAGNOSIS — Z299 Encounter for prophylactic measures, unspecified: Secondary | ICD-10-CM | POA: Diagnosis not present

## 2022-11-21 DIAGNOSIS — D2239 Melanocytic nevi of other parts of face: Secondary | ICD-10-CM | POA: Diagnosis not present

## 2022-11-21 DIAGNOSIS — L570 Actinic keratosis: Secondary | ICD-10-CM | POA: Diagnosis not present

## 2022-11-21 DIAGNOSIS — D0439 Carcinoma in situ of skin of other parts of face: Secondary | ICD-10-CM | POA: Diagnosis not present

## 2022-11-25 DIAGNOSIS — J069 Acute upper respiratory infection, unspecified: Secondary | ICD-10-CM | POA: Diagnosis not present

## 2022-11-25 DIAGNOSIS — Z299 Encounter for prophylactic measures, unspecified: Secondary | ICD-10-CM | POA: Diagnosis not present

## 2022-11-25 DIAGNOSIS — E118 Type 2 diabetes mellitus with unspecified complications: Secondary | ICD-10-CM | POA: Diagnosis not present

## 2022-11-25 DIAGNOSIS — G7000 Myasthenia gravis without (acute) exacerbation: Secondary | ICD-10-CM | POA: Diagnosis not present

## 2022-11-27 DIAGNOSIS — I10 Essential (primary) hypertension: Secondary | ICD-10-CM | POA: Diagnosis not present

## 2022-12-02 DIAGNOSIS — M25511 Pain in right shoulder: Secondary | ICD-10-CM | POA: Diagnosis not present

## 2022-12-02 DIAGNOSIS — R42 Dizziness and giddiness: Secondary | ICD-10-CM | POA: Diagnosis not present

## 2022-12-02 DIAGNOSIS — M6281 Muscle weakness (generalized): Secondary | ICD-10-CM | POA: Diagnosis not present

## 2022-12-02 DIAGNOSIS — H8110 Benign paroxysmal vertigo, unspecified ear: Secondary | ICD-10-CM | POA: Diagnosis not present

## 2022-12-05 DIAGNOSIS — E7800 Pure hypercholesterolemia, unspecified: Secondary | ICD-10-CM | POA: Diagnosis not present

## 2022-12-05 DIAGNOSIS — I779 Disorder of arteries and arterioles, unspecified: Secondary | ICD-10-CM | POA: Diagnosis not present

## 2022-12-05 DIAGNOSIS — E1165 Type 2 diabetes mellitus with hyperglycemia: Secondary | ICD-10-CM | POA: Diagnosis not present

## 2022-12-05 DIAGNOSIS — G7000 Myasthenia gravis without (acute) exacerbation: Secondary | ICD-10-CM | POA: Diagnosis not present

## 2022-12-05 DIAGNOSIS — I10 Essential (primary) hypertension: Secondary | ICD-10-CM | POA: Diagnosis not present

## 2022-12-05 DIAGNOSIS — Z299 Encounter for prophylactic measures, unspecified: Secondary | ICD-10-CM | POA: Diagnosis not present

## 2022-12-12 DIAGNOSIS — E1165 Type 2 diabetes mellitus with hyperglycemia: Secondary | ICD-10-CM | POA: Diagnosis not present

## 2023-01-02 ENCOUNTER — Ambulatory Visit: Payer: PPO | Admitting: Obstetrics and Gynecology

## 2023-01-02 VITALS — BP 138/80 | HR 71

## 2023-01-02 DIAGNOSIS — N3281 Overactive bladder: Secondary | ICD-10-CM

## 2023-01-02 DIAGNOSIS — N952 Postmenopausal atrophic vaginitis: Secondary | ICD-10-CM

## 2023-01-02 DIAGNOSIS — N993 Prolapse of vaginal vault after hysterectomy: Secondary | ICD-10-CM

## 2023-01-02 DIAGNOSIS — N8110 Cystocele, unspecified: Secondary | ICD-10-CM

## 2023-01-02 DIAGNOSIS — R350 Frequency of micturition: Secondary | ICD-10-CM

## 2023-01-02 DIAGNOSIS — N816 Rectocele: Secondary | ICD-10-CM

## 2023-01-02 MED ORDER — MIRABEGRON ER 50 MG PO TB24: 50.0000 mg | ORAL_TABLET | Freq: Every day | ORAL | 3 refills | Status: AC

## 2023-01-02 MED ORDER — ESTRADIOL 7.5 MCG/24HR VA RING: 1.0000 | VAGINAL_RING | VAGINAL | 3 refills | Status: DC

## 2023-01-02 NOTE — Progress Notes (Signed)
[facility]   Subjective:     Chief Complaint:  Chief Complaint  Patient presents with   Pessary Check    SHAMESHA SUH is a 77 y.o. female is here for 3 month pessary check   History of Present Illness: BREYAH SANDFORD is a 77 y.o. female with stage III pelvic organ prolapse who presents for a pessary check. She is using a size #3 long stem gellhorn pessary. The pessary has been working well and she has no complaints. She is using vaginal estrogen and has brought an E-string today. She denies vaginal bleeding.  Past Medical History: Patient  has a past medical history of Arthritis, Bursitis, Diabetes mellitus without complication (HCC), Dyspnea, Fibromyalgia, GERD (gastroesophageal reflux disease), Hyperlipemia, Hypertension, Myasthenia gravis (HCC), Sinusitis, Sleep apnea, Sleep apnea, and Wears glasses.   Past Surgical History: She  has a past surgical history that includes Cervical fusion (2007); Tonsillectomy; Abdominal hysterectomy (1999); Colonoscopy; Breast surgery (2002); Cholecystectomy (2005); Finger ganglion cyst excision; Shoulder arthroscopy with open rotator cuff repair and distal clavicle acrominectomy (Right, 09/09/2012); and Incision and drainage abscess.   Medications: She has a current medication list which includes the following prescription(s): apple cider vinegar, biotin w/ vitamins c & e, cetirizine, co-enzyme q-10, diclofenac sodium, estradiol, gabapentin, ginger root, glipizide, ipratropium, levocarnitine fumarate, losartan-hydrochlorothiazide, metformin, metoprolol tartrate, tart cherry advanced, multivitamin-lutein, omega-3 acid ethyl esters, pregabalin, pyridostigmine, rosuvastatin calcium, turmeric curcumin, vitamin d, and mirabegron er.   Allergies: Patient is allergic to celebrex [celecoxib], vioxx [rofecoxib], and latex.   Social History: Patient  reports that she has never smoked. She has never used smokeless tobacco. She reports that  she does not drink alcohol and does not use drugs.      Objective:    Physical Exam: BP 138/80   Pulse 71  Gen: No apparent distress, A&O x 3. Detailed Urogynecologic Evaluation:  Pelvic Exam: Normal external female genitalia; Bartholin's and Skene's glands normal in appearance; urethral meatus normal in appearance, no urethral masses or discharge. The pessary was noted to be in place. It was removed and cleaned. Speculum exam revealed no lesions in the vagina. The pessary was replaced. It was comfortable to the patient and fit well.    Assessment/Plan:    Assessment: Ms. Nazir is a 77 y.o. with stage III pelvic organ prolapse here for a pessary check. She is doing well.  Plan: She will keep the pessary in place until next visit. She will continue to have E-string in place. She will follow-up in 3 months for a pessary check or sooner as needed.    Refills provided for Myrbetriq 50mg  daily and E-string. May need a new tier exception for E-string. She has a Gellhorn pessary and is unable to get the cream behind the pessary and E-string has provided better symptom relief to keep her vaginal tract from bleeding with pessary use.   All questions were answered.

## 2023-01-06 DIAGNOSIS — I10 Essential (primary) hypertension: Secondary | ICD-10-CM | POA: Diagnosis not present

## 2023-01-06 DIAGNOSIS — Z299 Encounter for prophylactic measures, unspecified: Secondary | ICD-10-CM | POA: Diagnosis not present

## 2023-01-06 DIAGNOSIS — E1165 Type 2 diabetes mellitus with hyperglycemia: Secondary | ICD-10-CM | POA: Diagnosis not present

## 2023-01-06 DIAGNOSIS — R609 Edema, unspecified: Secondary | ICD-10-CM | POA: Diagnosis not present

## 2023-01-07 ENCOUNTER — Ambulatory Visit: Payer: PPO | Admitting: Obstetrics and Gynecology

## 2023-01-12 DIAGNOSIS — E1165 Type 2 diabetes mellitus with hyperglycemia: Secondary | ICD-10-CM | POA: Diagnosis not present

## 2023-01-20 ENCOUNTER — Telehealth: Payer: Self-pay | Admitting: Adult Health

## 2023-01-20 MED ORDER — PYRIDOSTIGMINE BROMIDE 60 MG PO TABS: 60.0000 mg | ORAL_TABLET | Freq: Three times a day (TID) | ORAL | 1 refills | Status: DC

## 2023-01-20 NOTE — Telephone Encounter (Signed)
Pt is requesting a refill for pyridostigmine (MESTINON) 60 MG tablet t.  Pharmacy: [facility]

## 2023-01-20 NOTE — Telephone Encounter (Signed)
Refill sent for the pt

## 2023-02-04 ENCOUNTER — Encounter: Payer: Self-pay | Admitting: Internal Medicine

## 2023-02-04 DIAGNOSIS — I10 Essential (primary) hypertension: Secondary | ICD-10-CM | POA: Diagnosis not present

## 2023-02-04 DIAGNOSIS — Z23 Encounter for immunization: Secondary | ICD-10-CM | POA: Diagnosis not present

## 2023-02-04 DIAGNOSIS — I779 Disorder of arteries and arterioles, unspecified: Secondary | ICD-10-CM | POA: Diagnosis not present

## 2023-02-04 DIAGNOSIS — G7000 Myasthenia gravis without (acute) exacerbation: Secondary | ICD-10-CM | POA: Diagnosis not present

## 2023-02-04 DIAGNOSIS — Z299 Encounter for prophylactic measures, unspecified: Secondary | ICD-10-CM | POA: Diagnosis not present

## 2023-02-04 DIAGNOSIS — E1165 Type 2 diabetes mellitus with hyperglycemia: Secondary | ICD-10-CM | POA: Diagnosis not present

## 2023-02-11 DIAGNOSIS — E1165 Type 2 diabetes mellitus with hyperglycemia: Secondary | ICD-10-CM | POA: Diagnosis not present

## 2023-02-26 DIAGNOSIS — I10 Essential (primary) hypertension: Secondary | ICD-10-CM | POA: Diagnosis not present

## 2023-03-11 DIAGNOSIS — R197 Diarrhea, unspecified: Secondary | ICD-10-CM | POA: Diagnosis not present

## 2023-03-11 DIAGNOSIS — G7000 Myasthenia gravis without (acute) exacerbation: Secondary | ICD-10-CM | POA: Diagnosis not present

## 2023-03-11 DIAGNOSIS — E1165 Type 2 diabetes mellitus with hyperglycemia: Secondary | ICD-10-CM | POA: Diagnosis not present

## 2023-03-11 DIAGNOSIS — I10 Essential (primary) hypertension: Secondary | ICD-10-CM | POA: Diagnosis not present

## 2023-03-11 DIAGNOSIS — Z299 Encounter for prophylactic measures, unspecified: Secondary | ICD-10-CM | POA: Diagnosis not present

## 2023-03-14 DIAGNOSIS — E1165 Type 2 diabetes mellitus with hyperglycemia: Secondary | ICD-10-CM | POA: Diagnosis not present

## 2023-03-28 DIAGNOSIS — I10 Essential (primary) hypertension: Secondary | ICD-10-CM | POA: Diagnosis not present

## 2023-04-03 ENCOUNTER — Ambulatory Visit: Payer: PPO | Admitting: Obstetrics and Gynecology

## 2023-04-08 DIAGNOSIS — E1169 Type 2 diabetes mellitus with other specified complication: Secondary | ICD-10-CM | POA: Diagnosis not present

## 2023-04-08 DIAGNOSIS — Z299 Encounter for prophylactic measures, unspecified: Secondary | ICD-10-CM | POA: Diagnosis not present

## 2023-04-08 DIAGNOSIS — I10 Essential (primary) hypertension: Secondary | ICD-10-CM | POA: Diagnosis not present

## 2023-04-13 DIAGNOSIS — E1165 Type 2 diabetes mellitus with hyperglycemia: Secondary | ICD-10-CM | POA: Diagnosis not present

## 2023-04-17 ENCOUNTER — Encounter: Payer: Self-pay | Admitting: Obstetrics and Gynecology

## 2023-04-17 ENCOUNTER — Ambulatory Visit: Payer: PPO | Admitting: Obstetrics and Gynecology

## 2023-04-17 VITALS — BP 143/82 | HR 66

## 2023-04-17 DIAGNOSIS — N816 Rectocele: Secondary | ICD-10-CM

## 2023-04-17 DIAGNOSIS — N952 Postmenopausal atrophic vaginitis: Secondary | ICD-10-CM

## 2023-04-17 DIAGNOSIS — N8110 Cystocele, unspecified: Secondary | ICD-10-CM

## 2023-04-17 DIAGNOSIS — N813 Complete uterovaginal prolapse: Secondary | ICD-10-CM | POA: Diagnosis not present

## 2023-04-17 DIAGNOSIS — N993 Prolapse of vaginal vault after hysterectomy: Secondary | ICD-10-CM

## 2023-04-17 NOTE — Progress Notes (Signed)
[facility]   Subjective:     Chief Complaint:  Chief Complaint  Patient presents with   Pessary Check    NATONIA TUCK is a 77 y.o. female is here for pessary check   History of Present Illness: JESSIE HABIGER is a 77 y.o. female with stage III pelvic organ prolapse who presents for a pessary check. She is using a size #3 long stem gellhorn pessary. The pessary has been working well and she has no complaints. She is using vaginal estrogen. She denies vaginal bleeding.  Patient also reports she has a headache today.    Past Medical History: Patient  has a past medical history of Arthritis, Bursitis, Diabetes mellitus without complication (HCC), Dyspnea, Fibromyalgia, GERD (gastroesophageal reflux disease), Hyperlipemia, Hypertension, Myasthenia gravis (HCC), Sinusitis, Sleep apnea, Sleep apnea, and Wears glasses.   Past Surgical History: She  has a past surgical history that includes Cervical fusion (2007); Tonsillectomy; Abdominal hysterectomy (1999); Colonoscopy; Breast surgery (2002); Cholecystectomy (2005); Finger ganglion cyst excision; Shoulder arthroscopy with open rotator cuff repair and distal clavicle acrominectomy (Right, 09/09/2012); and Incision and drainage abscess.   Medications: She has a current medication list which includes the following prescription(s): apple cider vinegar, biotin w/ vitamins c & e, cetirizine, co-enzyme q-10, diclofenac sodium, estradiol, gabapentin, ginger root, glipizide, ipratropium, levocarnitine fumarate, losartan-hydrochlorothiazide, metformin, metoprolol tartrate, mirabegron er, tart cherry advanced, multivitamin-lutein, omega-3 acid ethyl esters, pregabalin, pyridostigmine, rosuvastatin calcium, turmeric curcumin, and vitamin d.   Allergies: Patient is allergic to celebrex [celecoxib], vioxx [rofecoxib], and latex.   Social History: Patient  reports that she has never smoked. She has never used smokeless tobacco. She  reports that she does not drink alcohol and does not use drugs.      Objective:    Physical Exam: BP (!) 143/82   Pulse 66  Gen: Slow to respond and withdrawn, A&O x 3.  Neuro: Gait normal, speech normal, able to purse lips and move tongue side to side. Eyebrows even when raised.   HEENT: No obvious masses in sinuses or abnormalities in facial structures. Mild pressure applied to the occipitis to assist in headache.    Detailed Urogynecologic Evaluation:  Pelvic Exam: Normal external female genitalia; Bartholin's and Skene's glands normal in appearance; urethral meatus normal in appearance, no urethral masses or discharge. The pessary was noted to be in place. It was removed and cleaned. Speculum exam revealed no lesions in the vagina. The pessary was replaced. It was comfortable to the patient and fit well.     Assessment/Plan:    Assessment: Ms. Foore is a 77 y.o. with stage III pelvic organ prolapse here for a pessary check. She is doing well.  Plan: She will keep the pessary in place until next visit. She will continue to use estrogen. She will follow-up in 3 months for a pessary check or sooner as needed.   Patient's blood pressure was mildly elevated today in the office. No obvious neural defects on brief exam. Encouraged her that if she has worsening symptoms including changes in vision, speech, or gait. Patient reports she will seek evaluation if worsening.   All questions were answered.

## 2023-04-29 DIAGNOSIS — R058 Other specified cough: Secondary | ICD-10-CM | POA: Diagnosis not present

## 2023-04-29 DIAGNOSIS — J029 Acute pharyngitis, unspecified: Secondary | ICD-10-CM | POA: Diagnosis not present

## 2023-04-29 DIAGNOSIS — J069 Acute upper respiratory infection, unspecified: Secondary | ICD-10-CM | POA: Diagnosis not present

## 2023-04-29 DIAGNOSIS — Z299 Encounter for prophylactic measures, unspecified: Secondary | ICD-10-CM | POA: Diagnosis not present

## 2023-04-29 DIAGNOSIS — E1169 Type 2 diabetes mellitus with other specified complication: Secondary | ICD-10-CM | POA: Diagnosis not present

## 2023-05-05 ENCOUNTER — Telehealth: Payer: Self-pay | Admitting: Adult Health

## 2023-05-05 NOTE — Telephone Encounter (Signed)
 LVM and sent mychart msg informing pt of need to reschedule 06/05/23 appt - NP out

## 2023-05-06 NOTE — Telephone Encounter (Signed)
 Pt LVM at 3:38 pm to reschedule appt.  Called pt to reschedule on 07/29/23 at 3:15 pm

## 2023-05-14 DIAGNOSIS — E1165 Type 2 diabetes mellitus with hyperglycemia: Secondary | ICD-10-CM | POA: Diagnosis not present

## 2023-05-20 DIAGNOSIS — I340 Nonrheumatic mitral (valve) insufficiency: Secondary | ICD-10-CM | POA: Diagnosis not present

## 2023-05-20 DIAGNOSIS — I739 Peripheral vascular disease, unspecified: Secondary | ICD-10-CM | POA: Diagnosis not present

## 2023-05-20 DIAGNOSIS — I6529 Occlusion and stenosis of unspecified carotid artery: Secondary | ICD-10-CM | POA: Diagnosis not present

## 2023-06-05 ENCOUNTER — Ambulatory Visit: Payer: PPO | Admitting: Adult Health

## 2023-06-14 DIAGNOSIS — E1165 Type 2 diabetes mellitus with hyperglycemia: Secondary | ICD-10-CM | POA: Diagnosis not present

## 2023-06-23 DIAGNOSIS — E118 Type 2 diabetes mellitus with unspecified complications: Secondary | ICD-10-CM | POA: Diagnosis not present

## 2023-06-23 DIAGNOSIS — E1165 Type 2 diabetes mellitus with hyperglycemia: Secondary | ICD-10-CM | POA: Diagnosis not present

## 2023-06-23 DIAGNOSIS — J069 Acute upper respiratory infection, unspecified: Secondary | ICD-10-CM | POA: Diagnosis not present

## 2023-06-23 DIAGNOSIS — R059 Cough, unspecified: Secondary | ICD-10-CM | POA: Diagnosis not present

## 2023-06-23 DIAGNOSIS — Z299 Encounter for prophylactic measures, unspecified: Secondary | ICD-10-CM | POA: Diagnosis not present

## 2023-06-23 DIAGNOSIS — D692 Other nonthrombocytopenic purpura: Secondary | ICD-10-CM | POA: Diagnosis not present

## 2023-07-12 DIAGNOSIS — E1165 Type 2 diabetes mellitus with hyperglycemia: Secondary | ICD-10-CM | POA: Diagnosis not present

## 2023-07-16 ENCOUNTER — Ambulatory Visit: Payer: PPO | Admitting: Obstetrics and Gynecology

## 2023-07-17 ENCOUNTER — Ambulatory Visit: Payer: PPO | Admitting: Obstetrics and Gynecology

## 2023-07-17 DIAGNOSIS — Z6834 Body mass index (BMI) 34.0-34.9, adult: Secondary | ICD-10-CM | POA: Diagnosis not present

## 2023-07-17 DIAGNOSIS — Z01419 Encounter for gynecological examination (general) (routine) without abnormal findings: Secondary | ICD-10-CM | POA: Diagnosis not present

## 2023-07-17 DIAGNOSIS — Z1231 Encounter for screening mammogram for malignant neoplasm of breast: Secondary | ICD-10-CM | POA: Diagnosis not present

## 2023-07-23 ENCOUNTER — Telehealth: Payer: Self-pay

## 2023-07-23 DIAGNOSIS — Z79899 Other long term (current) drug therapy: Secondary | ICD-10-CM

## 2023-07-23 NOTE — Progress Notes (Signed)
   07/23/2023  Patient ID: [name], female   DOB: 06-Sep-1945, 78 y.o.   MRN: [id number]  Medication Adherence Fails Report:   -losartan 100 mg on report. However, per Dr. Tiajuana Amass, patient has been on candesartan since 08/2022. Last sold candesartan on 07/11/2023 x 90 days supply  Cephus Shelling, PharmD Clinical Pharmacist Cell: [phone]

## 2023-07-24 DIAGNOSIS — Z299 Encounter for prophylactic measures, unspecified: Secondary | ICD-10-CM | POA: Diagnosis not present

## 2023-07-24 DIAGNOSIS — L03119 Cellulitis of unspecified part of limb: Secondary | ICD-10-CM | POA: Diagnosis not present

## 2023-07-24 DIAGNOSIS — E1165 Type 2 diabetes mellitus with hyperglycemia: Secondary | ICD-10-CM | POA: Diagnosis not present

## 2023-07-24 DIAGNOSIS — I10 Essential (primary) hypertension: Secondary | ICD-10-CM | POA: Diagnosis not present

## 2023-07-24 DIAGNOSIS — M7989 Other specified soft tissue disorders: Secondary | ICD-10-CM | POA: Diagnosis not present

## 2023-07-25 DIAGNOSIS — L03119 Cellulitis of unspecified part of limb: Secondary | ICD-10-CM | POA: Diagnosis not present

## 2023-07-25 DIAGNOSIS — I10 Essential (primary) hypertension: Secondary | ICD-10-CM | POA: Diagnosis not present

## 2023-07-25 DIAGNOSIS — Z299 Encounter for prophylactic measures, unspecified: Secondary | ICD-10-CM | POA: Diagnosis not present

## 2023-07-25 DIAGNOSIS — I779 Disorder of arteries and arterioles, unspecified: Secondary | ICD-10-CM | POA: Diagnosis not present

## 2023-07-29 ENCOUNTER — Ambulatory Visit: Payer: PPO | Admitting: Adult Health

## 2023-07-29 ENCOUNTER — Encounter: Payer: Self-pay | Admitting: Adult Health

## 2023-07-29 NOTE — Progress Notes (Deleted)
 [facility]  PATIENT: [name] DOB: 1946-02-11   REASON FOR VISIT: Follow-up for ocular myasthenia HISTORY FROM: Patient   HISTORY:  [name] is a 78 y.o. 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 Pearlean Brownie with initial visit 02/2016    Consult 03/07/16 Dr. Pearlean Brownie;  [name] is a 89 year pleasant Caucasian lady whose had droopy eyelids first started in December 2016 when she stated states that "I was drooping and she will barely open it. She was seen by primary physician who thought she had partial Bell's palsy. This did not get better and subsequently in June she saw ophthalmologist who noticed that she had some infection on the lower eyelid treated with antibiotics. Since then she started that she has intermittent opening of both eyelids left more than right. There are times when arises is completely shut and on other times she can see well. In fact on inquiry she states that a couple of occasion while driving she was seeing double the dividing lines of the road for crossing over. She is also noticed that she gets fatigued and tired very easily. Initially when this first began this was thought to be related to viral infection but this does not seem to be getting better. She denies any difficulty moving her eyes, any dysphagia, weakness in her arms or legs. She has no trouble getting out, chair or climbing up and down steps. She has a good appetite and she had not lost any weight. Review of electronic medical record shows that she had elevated acetylcholine receptor antibodies on 01/15/16 with acetylcholine binding antibody of 6.16, blocking antibody 31 and modulating antibody 48 all of which are abnormal. Patient has not been tried on Mestinon on other medications for myasthenia. She denies any history of headaches, strokes, TIAs or other neurologic problems.  Update 1/16/2018PS : She returns for follow-up after  last visit 2 months ago. She states she has noticed benefit on Mestinon and eyes do not droop as much and she is not had any episodes of diplopia. She doesn't feel tired as much as well. However she was unable to tolerate 60 mg dose of Mestinon due to stomach cramps, nausea and is taking only 30 mg mostly 3 times a day and takes an occasional fourth dose if needed. She did undergo nerve conduction study with rapid repetitive stimulation on 04/10/16 which was negative for decremental response. The patient is reluctant to consider prednisone given her diabetes and risk of blood sugars being elevated on it  UPDATE 04/19/2018CM Crystal. [name], 78 year old female returns for follow-up with history of ocular myasthenia. She is currently on Mestinon sometimes taking a full pill but mostly half pills due to nausea. She is a diabetic so she does not want to take prednisone. She has had no double vision blurred vision or any drooping of her left eye. Vision today 20/50 right 20/40 left.She denies any difficulty moving her eyes, any dysphagia, weakness in her arms or legs. She has no trouble getting out, chair or climbing up and down steps.  She continues to be active. She returns for reevaluation. She is requesting something for nausea UPDATE 10/17/2018CM  Crystal. [name], 78 year old female returns for follow-up with a history of ocular myasthenia with symptoms beginning in June 2016. She denies any episodes of diplopia, no ptosis seen today. Vision today 2070 bilaterally. She denies any weakness in her arms or legs or any problems with breathing she continues to  be quite active. She continues to drive without difficulty she had 1 recent fall no injury.  She had a recent visit to her ophthalmologist. She is currently on Mestinon sometimes takes a full pill and sometimes a half pill. She has nausea but  Zofran did not help, she returns for reevaluation. Recently diagnosed with arthritis. Update 08/19/2017 :she returns for follow-up  after last visit 6 months ago. She states her cholesterol myasthenia symptoms are well controlled. She denies any drooping of the eyelids, diplopia, muscle fatigue or weakness. She states she is tired all the time but this may be related to sleep apnea and lack of exercise. She is currently on Mestinon 30 mg 4 times daily and is tolerating it quite well. She was unable to tolerate 60 mg due to diarrhea and upset stomach. She has no new complaints today. She does complain of some intermittent trouble swallowing when she thinks she swallows too quickly at times gets stuck in the middle of the chest. She denies any nasal regurg, nasal twang to her speech heart food being stuck in the back of her mouth.She states her blood pressure and sugar control have been quite good. 10/25/2019 : She returns for follow-up after last visit 2 years ago.  She states she was doing fine with her ptosis and myasthenia until 2 weeks ago when she is noticed increased droopiness of the left eye particularly towards the afternoon.  In the mornings her eyes seem much more open.  She called Dr. Anne Hahn who recommended increasing her Mestinon which she was taking 30 mg 4 times daily to 60 mg 3 times daily.  She has not noticed any specific benefit.  She does have mild diarrhea and nausea but is tolerating it otherwise well.  Patient in the past has not been able to tolerate higher doses of Mestinon.  Dr. Mayford Knife did discuss adding prednisone but the patient is reluctant to try this because of her diabetes and worsening sugars.  Patient denies any weakness in her extremities, significant fatigue or tiredness.  She denies any recent respiratory illness in the form of infection.  She did lose her balance 1 she tripped but did not hurt herself.  She does have some intermittent diplopia particularly with sustained upgaze.    Update 12/21/2019 JM: Crystal. [name] returns today for 56-month follow-up regarding ocular myasthenia After prior visit,  complaints of worsening left eye drooping Dr. Pearlean Brownie recommended increasing Mestinon dosage to 60 mg 4 times daily Reports improvement of left eye symptoms but continues to have some droopiness especially when outdoors or in bright light and diplopia if both eyes are open Side effects of diarrhea and nausea with benefit with use of Imodium and a " nausea medication" that PCP started her on -she is unable to state name She is frustrated regarding continued symptoms and she has not been able to return to driving Denies any extremity weakness, fatigue or imbalance She was reluctantly placed on prednisone in July due to severe back and hip pain Reports benefit of left eye symptoms and did not notice impact on her glucose levels No further concerns  Update 03/28/2020 Dr. Pearlean Brownie: She returns for follow-up after last visit with Shanda Bumps nurse practitioner 3 months ago.  She continues to have mild drooping of the left eye which is intermittent and more pronounced when she is tired and exhausted or excited.  She is taking Mestinon 60 mg 4 times daily but is having some trouble with diarrhea and upset stomach and  occasional cramps particularly during the early afternoon.  She has refused to take prednisone because of the long-term side effects.  She had a flareup of her back pain in August and was treated with a short course of prednisone which she feels transiently helped her myasthenic symptoms.  She also had a flareup of her sinus infection in October and took another short course of prednisone.  She plans to have cataract surgery soon.  She has given up driving because of fear of her eye closure.  Update 07/20/2020 JM: Returns for 4 months follow-up regarding ocular myasthenia  Continues to experience mild left eye drooping and diplopia but some improvement since prior that Currently on Mestinon 60mg  3 times daily - reports improvement of diarrhea, nausea and cramps after decreasing from 4 times to 3 times  daily dosing -thankfully symptoms have not worsened but actually improved  S/p cataract 12/2 right eye; 12/22 left eye Has been more sensitive to light since then  No new concerns at this time  Update 05/30/2021 JM:  Returns for follow-up visit after prior visit almost 1 year ago.  Reports improvement since prior visit. Only minimal left eye drooping currently and intermittent diplopia which can worsen after looking at something for too long or with low light. She has even returned back to driving without difficulty.  Continues on Mestinon 60 mg 3 times daily. Continued GI side effects but these have been stable. Continued use of Imodium PRN with benefit.    Update 05/30/2022 JM: Patient returns for 1 year follow-up. Symptoms have been stable over the past year. Can still have some worsening symptoms with bright light or fatigue but doesn't occur often.  Continues on Mestinon 60 mg 3 times daily. Continued GI side effects, use of Imodium daily and metamucil with benefit. No further concerns at this time.     Update 07/29/2023 JM: Patient returns for yearly follow-up.  Has been stable over the past year.  Remains on Mestinon 60 mg 3 times daily ***.         REVIEW OF SYSTEMS: Full 14 system review of systems performed and notable only for those listed, all others are neg: Intermittent diplopia, drooping of the left eyelid only. All systems negative    ALLERGIES: Allergies  Allergen Reactions   Celebrex [Celecoxib] Hives and Swelling    SWELLING REACTION UNSPECIFIED    Vioxx [Rofecoxib] Hives   Latex Rash    HOME MEDICATIONS: Outpatient Medications Prior to Visit  Medication Sig Dispense Refill   APPLE CIDER VINEGAR PO Take 1 tablet by mouth daily.      Biotin w/ Vitamins C & E (HAIR/SKIN/NAILS PO) Take 1 tablet by mouth daily.     cetirizine (ZYRTEC) 10 MG tablet Take 10 mg by mouth at bedtime.   0   Co-Enzyme Q-10 100 MG CAPS Take 100 mg by mouth daily.      diclofenac sodium  (VOLTAREN) 1 % GEL Apply 1 application topically 3 (three) times daily as needed (arthritis).      estradiol (ESTRING) 7.5 MCG/24HR vaginal ring Place 1 each vaginally every 3 (three) months. 1 each 3   gabapentin (NEURONTIN) 300 MG capsule Take 300 mg by mouth at bedtime.      Ginger, Zingiber officinalis, (GINGER ROOT) 550 MG CAPS Take 550 mg by mouth daily.      glipiZIDE (GLUCOTROL XL) 10 MG 24 hr tablet Take 10 mg by mouth 2 (two) times daily.      ipratropium (ATROVENT)  0.06 % nasal spray Place 2 sprays into both nostrils 3 (three) times daily.     levOCARNitine Fumarate 500 MG TABS Take 500 mg by mouth daily.      losartan-hydrochlorothiazide (HYZAAR) 100-25 MG tablet Take 1 tablet by mouth every evening.      metFORMIN (GLUCOPHAGE-XR) 500 MG 24 hr tablet Take 1,000 mg by mouth 2 (two) times daily with a meal.      metoprolol tartrate (LOPRESSOR) 25 MG tablet Take 12.5 mg by mouth daily.     mirabegron ER (MYRBETRIQ) 50 MG TB24 tablet Take 1 tablet (50 mg total) by mouth daily. 90 tablet 3   Misc Natural Products (TART CHERRY ADVANCED) CAPS Take 1 capsule by mouth 2 (two) times daily.      multivitamin-lutein (OCUVITE-LUTEIN) CAPS capsule Take 1 capsule by mouth daily.     omega-3 acid ethyl esters (LOVAZA) 1 g capsule Take 1 g by mouth daily.     pregabalin (LYRICA) 50 MG capsule Take 50 mg by mouth 2 (two) times daily.      pyridostigmine (MESTINON) 60 MG tablet Take 1 tablet (60 mg total) by mouth 3 (three) times daily. 270 tablet 1   Rosuvastatin Calcium 10 MG CPSP Take 5 mg by mouth at bedtime.      Turmeric Curcumin 500 MG CAPS Take 500 mg by mouth every evening.      VITAMIN D PO Take by mouth.     No facility-administered medications prior to visit.    PAST MEDICAL HISTORY: Past Medical History:  Diagnosis Date   Arthritis    Bursitis    Diabetes mellitus without complication (HCC)    Dyspnea    Fibromyalgia    GERD (gastroesophageal reflux disease)    Hyperlipemia     Hypertension    Myasthenia gravis (HCC)    ocular   Sinusitis    Sleep apnea    uses a cpap   Sleep apnea    Wears glasses     PAST SURGICAL HISTORY: Past Surgical History:  Procedure Laterality Date   ABDOMINAL HYSTERECTOMY  1999   BREAST SURGERY  2002   rt br bx   CERVICAL FUSION  2007   CHOLECYSTECTOMY  2005   lap choli   COLONOSCOPY     FINGER GANGLION CYST EXCISION     INCISION AND DRAINAGE ABSCESS     Staph infection with multiple abdominal wounds s/p I&D in [place] ~ 2014   SHOULDER ARTHROSCOPY WITH OPEN ROTATOR CUFF REPAIR AND DISTAL CLAVICLE ACROMINECTOMY Right 09/09/2012   Procedure: Right Shoulder Arthroscopy with distal clavicle excision and mini-open rotator cuff repair.;  Surgeon: Harvie Junior, MD;  Location: [facility];  Service: Orthopedics;  Laterality: Right;   TONSILLECTOMY      FAMILY HISTORY: Family History  Problem Relation Age of Onset   Emphysema Father    Intracerebral hemorrhage Sister    Myasthenia gravis Neg Hx        none that she knows of     SOCIAL HISTORY: Social History   Socioeconomic History   Marital status: Married    Spouse name: Not on file   Number of children: Not on file   Years of education: Not on file   Highest education level: Not on file  Occupational History   Occupation: Retired  Tobacco Use   Smoking status: Never   Smokeless tobacco: Never  Vaping Use   Vaping status: Never Used  Substance and Sexual Activity  Alcohol use: No   Drug use: No   Sexual activity: Not Currently  Other Topics Concern   Not on file  Social History Narrative   Lives at home with husband   Right handed   Caffeine: maybe 1 cup/day   Social Drivers of Corporate investment banker Strain: Not on file  Food Insecurity: Not on file  Transportation Needs: No Transportation Needs (05/01/2021)   Received from [facility], [facility]   PRAPARE - Transportation    Lack of Transportation (Medical): No    Lack of  Transportation (Non-Medical): No  Physical Activity: Not on file  Stress: Not on file  Social Connections: Unknown (09/07/2021)   Received from [facility], [facility]   Social Network    Social Network: Not on file  Intimate Partner Violence: Unknown (07/30/2021)   Received from [facility], [facility]   HITS    Physically Hurt: Not on file    Insult or Talk Down To: Not on file    Threaten Physical Harm: Not on file    Scream or Curse: Not on file     PHYSICAL EXAM  There were no vitals filed for this visit.  There is no height or weight on file to calculate BMI.  Generalized: Very pleasant elderly Caucasian lady,Obese female in no acute distress  Head: normocephalic and atraumatic,.   Neck: Supple, no carotid bruits  Cardiac: Regular rate rhythm, no murmur  Musculoskeletal: No deformity   Neurological examination   Mentation: Alert oriented to time, place, history taking. Attention span and concentration appropriate. Recent and remote memory intact.  Follows all commands speech and language fluent.  Cranial nerve II-XII: Pupils were equal round reactive to light extraocular movements were full, no nystagmus, left eye mild drooping at rest which increases with sustained upgaze for 1 minute.  No diplopia and ophthalmoplegia on exam. Facial sensation and strength were normal. hearing was intact to finger rubbing bilaterally. Uvula tongue midline. head turning and shoulder shrug were normal and symmetric.Tongue protrusion into cheek strength was normal. Motor: normal bulk and tone, full strength in the BUE, BLE, except decreased weakness distally right  lower extremity,  fine finger movements normal Sensory: normal and symmetric to light touch, pinprick, and  Vibration, in the upper and lower extremities  Coordination: finger-nose-finger, heel-to-shin bilaterally, no dysmetria Reflexes: 1+ upper lower and symmetric, plantar responses were flexor bilaterally. Gait and  Station: Rising up from seated position without assistance, normal stance,  moderate stride, good arm swing, smooth turning. Ambulates without assistive device     ASSESSMENT AND PLAN 76 year Caucasian lady with gradual onset drooping of eyelids in December 2016 due to ocular myasthenia. She has positive acetylcholine receptor antibodies. She has some GI intolerance to Mestinon but stable on Imodium and Metamucil    Ocular myasthenia -Continue Mestinon 60 mg 3 times daily -refill provided, intolerant to higher doses -Continue Imodium as needed and Metamucil -Advised her to call with any worsening of symptoms or flare ups,  if this occurs, would recommend trial of prednisone as intolerant to higher dosages of Mestinon    Follow-up in 1 year or call earlier if needed    CC:  Vyas, Dhruv B, MD   I spent 24 minutes of face-to-face and non-face-to-face time with patient.  This included previsit chart review, lab review, study review, order entry, electronic health record documentation, patient education and discussion regarding ocular myasthenia, further treatment options and interventions and answered all  other questions to patient satisfaction  Ihor Austin, [facility]  [facility] [address] [place], [place] [address]  Phone [phone] Fax [phone] Note: This document was prepared with digital dictation and possible smart phrase technology. Any transcriptional errors that result from this process are unintentional.

## 2023-07-31 DIAGNOSIS — I779 Disorder of arteries and arterioles, unspecified: Secondary | ICD-10-CM | POA: Diagnosis not present

## 2023-07-31 DIAGNOSIS — Z299 Encounter for prophylactic measures, unspecified: Secondary | ICD-10-CM | POA: Diagnosis not present

## 2023-07-31 DIAGNOSIS — L03119 Cellulitis of unspecified part of limb: Secondary | ICD-10-CM | POA: Diagnosis not present

## 2023-07-31 DIAGNOSIS — D692 Other nonthrombocytopenic purpura: Secondary | ICD-10-CM | POA: Diagnosis not present

## 2023-07-31 DIAGNOSIS — G7000 Myasthenia gravis without (acute) exacerbation: Secondary | ICD-10-CM | POA: Diagnosis not present

## 2023-07-31 DIAGNOSIS — I10 Essential (primary) hypertension: Secondary | ICD-10-CM | POA: Diagnosis not present

## 2023-08-12 DIAGNOSIS — E1165 Type 2 diabetes mellitus with hyperglycemia: Secondary | ICD-10-CM | POA: Diagnosis not present

## 2023-08-18 DIAGNOSIS — Z1331 Encounter for screening for depression: Secondary | ICD-10-CM | POA: Diagnosis not present

## 2023-08-18 DIAGNOSIS — I10 Essential (primary) hypertension: Secondary | ICD-10-CM | POA: Diagnosis not present

## 2023-08-18 DIAGNOSIS — I779 Disorder of arteries and arterioles, unspecified: Secondary | ICD-10-CM | POA: Diagnosis not present

## 2023-08-18 DIAGNOSIS — Z6832 Body mass index (BMI) 32.0-32.9, adult: Secondary | ICD-10-CM | POA: Diagnosis not present

## 2023-08-18 DIAGNOSIS — G7000 Myasthenia gravis without (acute) exacerbation: Secondary | ICD-10-CM | POA: Diagnosis not present

## 2023-08-18 DIAGNOSIS — Z0000 Encounter for general adult medical examination without abnormal findings: Secondary | ICD-10-CM | POA: Diagnosis not present

## 2023-08-18 DIAGNOSIS — Z1339 Encounter for screening examination for other mental health and behavioral disorders: Secondary | ICD-10-CM | POA: Diagnosis not present

## 2023-08-18 DIAGNOSIS — Z299 Encounter for prophylactic measures, unspecified: Secondary | ICD-10-CM | POA: Diagnosis not present

## 2023-08-18 DIAGNOSIS — Z7189 Other specified counseling: Secondary | ICD-10-CM | POA: Diagnosis not present

## 2023-08-20 DIAGNOSIS — I10 Essential (primary) hypertension: Secondary | ICD-10-CM | POA: Diagnosis not present

## 2023-08-20 DIAGNOSIS — E1165 Type 2 diabetes mellitus with hyperglycemia: Secondary | ICD-10-CM | POA: Diagnosis not present

## 2023-08-20 DIAGNOSIS — Z299 Encounter for prophylactic measures, unspecified: Secondary | ICD-10-CM | POA: Diagnosis not present

## 2023-08-20 DIAGNOSIS — R21 Rash and other nonspecific skin eruption: Secondary | ICD-10-CM | POA: Diagnosis not present

## 2023-08-27 DIAGNOSIS — I10 Essential (primary) hypertension: Secondary | ICD-10-CM | POA: Diagnosis not present

## 2023-09-08 DIAGNOSIS — I779 Disorder of arteries and arterioles, unspecified: Secondary | ICD-10-CM | POA: Diagnosis not present

## 2023-09-08 DIAGNOSIS — Z299 Encounter for prophylactic measures, unspecified: Secondary | ICD-10-CM | POA: Diagnosis not present

## 2023-09-08 DIAGNOSIS — M25551 Pain in right hip: Secondary | ICD-10-CM | POA: Diagnosis not present

## 2023-09-08 DIAGNOSIS — I10 Essential (primary) hypertension: Secondary | ICD-10-CM | POA: Diagnosis not present

## 2023-09-08 DIAGNOSIS — R52 Pain, unspecified: Secondary | ICD-10-CM | POA: Diagnosis not present

## 2023-09-08 DIAGNOSIS — G4730 Sleep apnea, unspecified: Secondary | ICD-10-CM | POA: Diagnosis not present

## 2023-09-16 DIAGNOSIS — I10 Essential (primary) hypertension: Secondary | ICD-10-CM | POA: Diagnosis not present

## 2023-09-16 DIAGNOSIS — I779 Disorder of arteries and arterioles, unspecified: Secondary | ICD-10-CM | POA: Diagnosis not present

## 2023-09-16 DIAGNOSIS — M25551 Pain in right hip: Secondary | ICD-10-CM | POA: Diagnosis not present

## 2023-09-16 DIAGNOSIS — E118 Type 2 diabetes mellitus with unspecified complications: Secondary | ICD-10-CM | POA: Diagnosis not present

## 2023-09-16 DIAGNOSIS — M47816 Spondylosis without myelopathy or radiculopathy, lumbar region: Secondary | ICD-10-CM | POA: Diagnosis not present

## 2023-09-16 DIAGNOSIS — E1169 Type 2 diabetes mellitus with other specified complication: Secondary | ICD-10-CM | POA: Diagnosis not present

## 2023-09-16 DIAGNOSIS — Z299 Encounter for prophylactic measures, unspecified: Secondary | ICD-10-CM | POA: Diagnosis not present

## 2023-09-18 ENCOUNTER — Ambulatory Visit: Admitting: Obstetrics and Gynecology

## 2023-09-18 ENCOUNTER — Encounter: Payer: Self-pay | Admitting: Obstetrics and Gynecology

## 2023-09-18 VITALS — BP 142/77 | HR 66

## 2023-09-18 DIAGNOSIS — N813 Complete uterovaginal prolapse: Secondary | ICD-10-CM

## 2023-09-18 DIAGNOSIS — N952 Postmenopausal atrophic vaginitis: Secondary | ICD-10-CM

## 2023-09-18 MED ORDER — ESTRADIOL 0.1 MG/GM VA CREA: 0.5000 g | TOPICAL_CREAM | VAGINAL | 11 refills | Status: DC

## 2023-09-18 NOTE — Patient Instructions (Signed)
 Re-start vaginal estorgen cream x2 weekly for vaginal tissue support.

## 2023-09-18 NOTE — Progress Notes (Signed)
 [facility]   Subjective:      Chief Complaint:  Chief Complaint  Patient presents with   Follow-up    [name] is a 78 y.o. female is here for pessary cleaning/check.   History of Present Illness: [name] is a 78 y.o. female with stage III pelvic organ prolapse who presents for a pessary check. She is using a size #3 long stem gellhorn pessary. The pessary has been working well and she has no complaints. She is using vaginal estrogen via an E-string, but reports she did not bring a new one today due to elevated cost. She denies vaginal bleeding.  Past Medical History: Patient  has a past medical history of Arthritis, Bursitis, Diabetes mellitus without complication (HCC), Dyspnea, Fibromyalgia, GERD (gastroesophageal reflux disease), Hyperlipemia, Hypertension, Myasthenia gravis (HCC), Sinusitis, Sleep apnea, Sleep apnea, and Wears glasses.   Past Surgical History: She  has a past surgical history that includes Cervical fusion (2007); Tonsillectomy; Abdominal hysterectomy (1999); Colonoscopy; Breast surgery (2002); Cholecystectomy (2005); Finger ganglion cyst excision; Shoulder arthroscopy with open rotator cuff repair and distal clavicle acrominectomy (Right, 09/09/2012); and Incision and drainage abscess.   Medications: She has a current medication list which includes the following prescription(s): apple cider vinegar, biotin w/ vitamins c & e, cetirizine, co-enzyme q-10, diclofenac sodium, estradiol , gabapentin, ginger root, glipizide, levocarnitine fumarate, losartan-hydrochlorothiazide, metformin, metoprolol tartrate, mirabegron  er, tart cherry advanced, multivitamin-lutein, omega-3 acid ethyl esters, pregabalin, pyridostigmine , rosuvastatin calcium, turmeric curcumin, vitamin d, and ipratropium.   Allergies: Patient is allergic to celebrex [celecoxib], vioxx [rofecoxib], and latex.   Social History: Patient  reports that she has never smoked. She  has never used smokeless tobacco. She reports that she does not drink alcohol  and does not use drugs.      Objective:     Physical Exam: BP (!) 142/77   Pulse 66  Gen: No apparent distress, A&O x 3. Detailed Urogynecologic Evaluation:  Pelvic Exam: Normal external female genitalia; Bartholin's and Skene's glands normal in appearance; urethral meatus normal in appearance, no urethral masses or discharge. The pessary was noted to be in place. It was removed and cleaned. Speculum exam revealed no lesions in the vagina. The pessary was replaced. It was comfortable to the patient and fit well.     Assessment/Plan:    Assessment: [name] is a 78 y.o. with stage III pelvic organ prolapse here for a pessary check. She is doing well.  Plan: She will keep the pessary in place until next visit. She will continue to use estrogen. She will follow-up in 3 months for a pessary check or sooner as needed.  All questions were answered.

## 2023-09-26 ENCOUNTER — Other Ambulatory Visit: Payer: Self-pay | Admitting: Obstetrics and Gynecology

## 2023-10-14 ENCOUNTER — Telehealth: Payer: Self-pay

## 2023-10-14 NOTE — Progress Notes (Signed)
   10/14/2023  Patient ID: [name], female   DOB: 1945-08-17, 78 y.o.   MRN: [id number]  Contacted patient regarding medication adherence from a quality report for [facility]. Left HIPAA compliant voicemail.   Per [facility] and payer portal fill history: Metformin 500 mg - last filled 09/26/23 for a 90-day supply. Rosuvastatin 10 mg - last filled 07/11/23 for an 90-day supply. Candesartan 16 mg - last filled 07/10/23 for a 90-day supply.   I will follow up for adherence monitoring.   Livia Riffle, PharmD Clinical Pharmacist  [phone]

## 2023-10-21 DIAGNOSIS — Z0000 Encounter for general adult medical examination without abnormal findings: Secondary | ICD-10-CM | POA: Diagnosis not present

## 2023-10-21 DIAGNOSIS — Z299 Encounter for prophylactic measures, unspecified: Secondary | ICD-10-CM | POA: Diagnosis not present

## 2023-10-21 DIAGNOSIS — G7000 Myasthenia gravis without (acute) exacerbation: Secondary | ICD-10-CM | POA: Diagnosis not present

## 2023-10-21 DIAGNOSIS — I10 Essential (primary) hypertension: Secondary | ICD-10-CM | POA: Diagnosis not present

## 2023-10-21 DIAGNOSIS — E7800 Pure hypercholesterolemia, unspecified: Secondary | ICD-10-CM | POA: Diagnosis not present

## 2023-10-21 DIAGNOSIS — Z79899 Other long term (current) drug therapy: Secondary | ICD-10-CM | POA: Diagnosis not present

## 2023-10-21 DIAGNOSIS — R52 Pain, unspecified: Secondary | ICD-10-CM | POA: Diagnosis not present

## 2023-10-23 DIAGNOSIS — E7800 Pure hypercholesterolemia, unspecified: Secondary | ICD-10-CM | POA: Diagnosis not present

## 2023-10-23 DIAGNOSIS — Z0000 Encounter for general adult medical examination without abnormal findings: Secondary | ICD-10-CM | POA: Diagnosis not present

## 2023-10-23 DIAGNOSIS — Z79899 Other long term (current) drug therapy: Secondary | ICD-10-CM | POA: Diagnosis not present

## 2023-11-10 DIAGNOSIS — Z299 Encounter for prophylactic measures, unspecified: Secondary | ICD-10-CM | POA: Diagnosis not present

## 2023-11-10 DIAGNOSIS — I10 Essential (primary) hypertension: Secondary | ICD-10-CM | POA: Diagnosis not present

## 2023-11-10 DIAGNOSIS — M25551 Pain in right hip: Secondary | ICD-10-CM | POA: Diagnosis not present

## 2023-11-21 DIAGNOSIS — Z85828 Personal history of other malignant neoplasm of skin: Secondary | ICD-10-CM | POA: Diagnosis not present

## 2023-11-21 DIAGNOSIS — I872 Venous insufficiency (chronic) (peripheral): Secondary | ICD-10-CM | POA: Diagnosis not present

## 2023-11-21 DIAGNOSIS — D1801 Hemangioma of skin and subcutaneous tissue: Secondary | ICD-10-CM | POA: Diagnosis not present

## 2023-11-21 DIAGNOSIS — L98499 Non-pressure chronic ulcer of skin of other sites with unspecified severity: Secondary | ICD-10-CM | POA: Diagnosis not present

## 2023-11-21 DIAGNOSIS — L570 Actinic keratosis: Secondary | ICD-10-CM | POA: Diagnosis not present

## 2023-11-21 DIAGNOSIS — I8312 Varicose veins of left lower extremity with inflammation: Secondary | ICD-10-CM | POA: Diagnosis not present

## 2023-11-21 DIAGNOSIS — L308 Other specified dermatitis: Secondary | ICD-10-CM | POA: Diagnosis not present

## 2023-11-21 DIAGNOSIS — D485 Neoplasm of uncertain behavior of skin: Secondary | ICD-10-CM | POA: Diagnosis not present

## 2023-11-21 DIAGNOSIS — I8311 Varicose veins of right lower extremity with inflammation: Secondary | ICD-10-CM | POA: Diagnosis not present

## 2023-11-21 DIAGNOSIS — L814 Other melanin hyperpigmentation: Secondary | ICD-10-CM | POA: Diagnosis not present

## 2023-11-21 DIAGNOSIS — L821 Other seborrheic keratosis: Secondary | ICD-10-CM | POA: Diagnosis not present

## 2023-11-24 DIAGNOSIS — R011 Cardiac murmur, unspecified: Secondary | ICD-10-CM | POA: Diagnosis not present

## 2023-12-18 ENCOUNTER — Ambulatory Visit: Admitting: Obstetrics and Gynecology

## 2023-12-25 DIAGNOSIS — J849 Interstitial pulmonary disease, unspecified: Secondary | ICD-10-CM | POA: Diagnosis not present

## 2023-12-25 DIAGNOSIS — Z299 Encounter for prophylactic measures, unspecified: Secondary | ICD-10-CM | POA: Diagnosis not present

## 2023-12-25 DIAGNOSIS — G4730 Sleep apnea, unspecified: Secondary | ICD-10-CM | POA: Diagnosis not present

## 2023-12-25 DIAGNOSIS — G7000 Myasthenia gravis without (acute) exacerbation: Secondary | ICD-10-CM | POA: Diagnosis not present

## 2023-12-25 DIAGNOSIS — I10 Essential (primary) hypertension: Secondary | ICD-10-CM | POA: Diagnosis not present

## 2023-12-25 DIAGNOSIS — E119 Type 2 diabetes mellitus without complications: Secondary | ICD-10-CM | POA: Diagnosis not present

## 2024-01-03 ENCOUNTER — Ambulatory Visit: Payer: Self-pay | Admitting: Internal Medicine

## 2024-01-03 NOTE — Progress Notes (Signed)
 Normal allergy profole a year ago. Apologies for delay in result release

## 2024-01-04 ENCOUNTER — Telehealth: Payer: Self-pay | Admitting: Internal Medicine

## 2024-01-04 DIAGNOSIS — J849 Interstitial pulmonary disease, unspecified: Secondary | ICD-10-CM

## 2024-01-04 NOTE — Telephone Encounter (Signed)
  Last seen a  year ago. At that time ILD was worse since 2017 but stable 2021-> 2024.   Plan She needs continued monitoring -> I have ordered PFT-> pls give her followup with APP after PFT   Xxxx   IMPRESSION: 1. 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 Mild patchy air trapping in the right greater than left lungs, suggesting small airways disease. No compelling evidence of interval progression since 09/07/2019 CT. Findings have progressed mildly from baseline 04/10/2016 CT. Favor fibrotic NSIP. UIP considered less likely. Findings are indeterminate for UIP per consensus guidelines: Diagnosis of Idiopathic Pulmonary Fibrosis: An Official ATS/ERS/JRS/ALAT Clinical Practice Guideline. Am JINNY Honey Crit Care Med Vol 198, Iss 5, [phone], Dec 28 2016. 2. Three-vessel coronary atherosclerosis. 3.  Aortic Atherosclerosis (ICD10-I70.0).     Electronically Signed   By: Selinda DELENA Blue M.D.   On: 09/22/2022 13:58  Xxx    Latest Ref Rng & Units 07/24/2020    3:03 PM 08/10/2019   10:53 AM 06/02/2018   12:49 PM 06/03/2017   11:05 AM 12/03/2016    9:51 AM  PFT Results  FVC-Pre L 2.61  2.47  2.59  2.66  2.66  P  FVC-Predicted Pre % 94  88  91  93  92  P  FVC-Post L     2.49  P  FVC-Predicted Post %     86  P  Pre FEV1/FVC % % 90  92  92  89  90  P  Post FEV1/FCV % %     95  P  FEV1-Pre L 2.35  2.27  2.38  2.38  2.39  P  FEV1-Predicted Pre % 113  107  110  110  110  P  FEV1-Post L     2.35  P  DLCO uncorrected ml/min/mmHg 17.75  17.01  15.58  16.35  17.17  P  DLCO UNC% % 95  91  83  71  74  P  DLCO corrected ml/min/mmHg 17.75  17.52    18.34  P  DLCO COR %Predicted % 95  93    80  P  DLVA Predicted % 109  101  90  84  98  P  TLC L     4.43  P  TLC % Predicted %     90  P  RV % Predicted %     79  P    P Preliminary result

## 2024-01-05 NOTE — Telephone Encounter (Signed)
 Attempted to call patient, no one answered so I left a voicemail.

## 2024-01-08 NOTE — Telephone Encounter (Signed)
 2nd attempt Called PT no answer left VM

## 2024-01-09 ENCOUNTER — Ambulatory Visit: Admitting: Obstetrics and Gynecology

## 2024-01-09 VITALS — BP 155/65 | HR 70

## 2024-01-09 DIAGNOSIS — N952 Postmenopausal atrophic vaginitis: Secondary | ICD-10-CM

## 2024-01-09 DIAGNOSIS — N993 Prolapse of vaginal vault after hysterectomy: Secondary | ICD-10-CM

## 2024-01-09 DIAGNOSIS — N813 Complete uterovaginal prolapse: Secondary | ICD-10-CM

## 2024-01-09 DIAGNOSIS — N816 Rectocele: Secondary | ICD-10-CM

## 2024-01-09 DIAGNOSIS — N8110 Cystocele, unspecified: Secondary | ICD-10-CM

## 2024-01-09 DIAGNOSIS — Z960 Presence of urogenital implants: Secondary | ICD-10-CM | POA: Diagnosis not present

## 2024-01-09 MED ORDER — ESTRADIOL 0.1 MG/GM VA CREA: 0.5000 g | TOPICAL_CREAM | VAGINAL | 11 refills | Status: AC

## 2024-01-09 NOTE — Telephone Encounter (Signed)
 3rd attempt called PT no answer left VM. Sent letter.

## 2024-01-09 NOTE — Progress Notes (Signed)
 [facility]   Subjective:     Chief Complaint:  Chief Complaint  Patient presents with   Pessary Check    [name] is a 78 y.o. female is here for pessary check.   History of Present Illness: [name] is a 78 y.o. female with stage III pelvic organ prolapse who presents for a pessary check. She is using a size #3 long stem gellhorn pessary. The pessary has been working well and she has no complaints. She is not using vaginal estrogen. She denies vaginal bleeding.  Past Medical History: Patient  has a past medical history of Arthritis, Bursitis, Diabetes mellitus without complication (HCC), Dyspnea, Fibromyalgia, GERD (gastroesophageal reflux disease), Hyperlipemia, Hypertension, Myasthenia gravis (HCC), Sinusitis, Sleep apnea, Sleep apnea, and Wears glasses.   Past Surgical History: She  has a past surgical history that includes Cervical fusion (2007); Tonsillectomy; Abdominal hysterectomy (1999); Colonoscopy; Breast surgery (2002); Cholecystectomy (2005); Finger ganglion cyst excision; Shoulder arthroscopy with open rotator cuff repair and distal clavicle acrominectomy (Right, 09/09/2012); and Incision and drainage abscess.   Medications: She has a current medication list which includes the following prescription(s): apple cider vinegar, biotin w/ vitamins c & e, cetirizine, co-enzyme q-10, diclofenac sodium, gabapentin, ginger root, glipizide, ipratropium, levocarnitine fumarate, losartan-hydrochlorothiazide, metformin, metoprolol tartrate, mirabegron  er, tart cherry advanced, multivitamin-lutein, omega-3 acid ethyl esters, pregabalin, pyridostigmine , rosuvastatin calcium, turmeric curcumin, vitamin d, and [START ON 01/12/2024] estradiol .   Allergies: Patient is allergic to celebrex [celecoxib], vioxx [rofecoxib], and latex.   Social History: Patient  reports that she has never smoked. She has never used smokeless tobacco. She reports that she does not  drink alcohol  and does not use drugs.      Objective:    Physical Exam: BP (!) 155/65   Pulse 70  Gen: No apparent distress, A&O x 3. Detailed Urogynecologic Evaluation:  Pelvic Exam: Normal external female genitalia, but there is irritation around the perineal area; Bartholin's and Skene's glands normal in appearance; urethral meatus normal in appearance, no urethral masses or discharge. The pessary was noted to be in place. It was removed and cleaned. Speculum exam revealed no lesions in the vagina. The pessary was replaced. It was comfortable to the patient and fit well.   Assessment/Plan:    Assessment: Ms. Eynon is a 78 y.o. with stage III pelvic organ prolapse here for a pessary check. She is doing well.  Plan: She will keep the pessary in place until next visit. She will continue to use estrogen. She will follow-up in 3 months for a pessary check or sooner as needed.   New prescription for estrogen cream sent to pharmacy. We discussed that the area of irritation could be from her pads rubbing. We discussed trying the foam pads as that may chafe less and using some zinc based or vitamin D cream at the irritated skin area.   All questions were answered.

## 2024-01-09 NOTE — Patient Instructions (Addendum)
 Please use the estrogen cream x2 weekly in the vagina.

## 2024-01-12 DIAGNOSIS — L0390 Cellulitis, unspecified: Secondary | ICD-10-CM | POA: Diagnosis not present

## 2024-01-13 ENCOUNTER — Ambulatory Visit: Admitting: Internal Medicine

## 2024-01-13 DIAGNOSIS — J849 Interstitial pulmonary disease, unspecified: Secondary | ICD-10-CM | POA: Diagnosis not present

## 2024-01-13 LAB — PULMONARY FUNCTION TEST
DL/VA % pred: 87 %
DL/VA: 3.6 ml/min/mmHg/L
DLCO unc % pred: 74 %
DLCO unc: 13.67 ml/min/mmHg
FEF 25-75 Pre: 3.24 L/s
FEF2575-%Pred-Pre: 215 %
FEV1-%Pred-Pre: 108 %
FEV1-Pre: 2.12 L
FEV1FVC-%Pred-Pre: 115 %
FEV6-%Pred-Pre: 99 %
FEV6-Pre: 2.46 L
FEV6FVC-%Pred-Pre: 105 %
FVC-%Pred-Pre: 94 %
FVC-Pre: 2.46 L
Pre FEV1/FVC ratio: 86 %
Pre FEV6/FVC Ratio: 100 %

## 2024-01-13 NOTE — Patient Instructions (Signed)
 SPIRO/DLCO performed today.

## 2024-01-13 NOTE — Progress Notes (Signed)
 SPIRO/DLCO performed today.

## 2024-02-05 DIAGNOSIS — I10 Essential (primary) hypertension: Secondary | ICD-10-CM | POA: Diagnosis not present

## 2024-02-05 DIAGNOSIS — E039 Hypothyroidism, unspecified: Secondary | ICD-10-CM | POA: Diagnosis not present

## 2024-02-05 DIAGNOSIS — Z299 Encounter for prophylactic measures, unspecified: Secondary | ICD-10-CM | POA: Diagnosis not present

## 2024-02-05 DIAGNOSIS — E1165 Type 2 diabetes mellitus with hyperglycemia: Secondary | ICD-10-CM | POA: Diagnosis not present

## 2024-02-05 DIAGNOSIS — R197 Diarrhea, unspecified: Secondary | ICD-10-CM | POA: Diagnosis not present

## 2024-02-05 DIAGNOSIS — G7000 Myasthenia gravis without (acute) exacerbation: Secondary | ICD-10-CM | POA: Diagnosis not present

## 2024-02-23 ENCOUNTER — Ambulatory Visit: Admitting: Primary Care

## 2024-02-23 NOTE — Progress Notes (Deleted)
 @Patient  ID: [name], female    DOB: [birth year], 78 y.o.   MRN: [id number]  No chief complaint on file.   Referring provider: Rosamond Leta NOVAK, MD  HPI: 78 year old female, never smoked. PMH ILD, dyslipidemia, hx sleep apnea. Patient of Dr. Geronimo.   Previous [facility] pulmonary encounter: 07/16/2022 -  Dr. 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 Harlene Bogaert Morbidly obese  HPI [name] 78 y.o. -last seen 2 years ago.  I saw her in March 2022 and she was supposed to see me back in 6 months for pulmonary function test but it is unclear why she has not followed up.  She is here because she says she is quite short of breath with exertion relieved by rest.  At variable time she said that she was stable in the last 2 years and she has had insidious onset of shortness of breath with exertion relieved by rest for a long time and it is the same but she also admitted that recently it is more difficult.  Symptoms course suggest worsening.  However walking desaturation test is stable.  She is also been dealing with shoulder issues and she underwent shoulder physical therapy.  Her last pulmonary function test and CT scan was over 2 years ago.   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  First established in 2017/2016.  Her most recent follow-up in February 2024 neurologist opinion was that she was stable although she does have some fatigue with bright light and she was continuing on Mestinon  60 mg 3 times daily.  She is also seeing OB/GYN for vaginal atrophy and plastic surgery for adiposity.  Review of the indicate she has not had her echocardiogram  She also told CMA  she is having spring allergies   02/23/2024- Interim hx  Discussed the use of AI scribe software for clinical note transcription with the patient, who gave verbal consent to proceed.  History of Present Illness  Overdue follow-up for ILD, last seen in March 2024. At that time ILD was felt to be worse compared to 2017 but stable 2021-2024. Plan continue monitoring. Needs annual PFT  PFTs done in September, FEV1 108% with minimal diffusion difficult (not correlated for hgb).  No significant change to lung functoin compared to 2021 but noted decrease in diffusion defect Recommend getting HGB and correlating DLCO   Any worsening clinical symptoms? Need sit to stand testing   CT CHEST IMPRESSION: 1. 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 Mild patchy air trapping in the right greater than left lungs, suggesting small airways disease. No compelling evidence of interval progression since 09/07/2019 CT. Findings have progressed mildly from baseline 04/10/2016 CT. Favor fibrotic NSIP. UIP considered less likely. Findings are indeterminate for UIP per consensus guidelines: Diagnosis of Idiopathic Pulmonary Fibrosis: An Official ATS/ERS/JRS/ALAT Clinical Practice Guideline. Am JINNY [employer] Crit Care Med Vol 198, Iss 5, [phone], Dec 28 2016.  SYMPTOM SCALE - ILD 08/31/2019  01/13/2020  07/24/2020  07/16/2022  02/23/2024  O2 use ra ra ra ra RA  Shortness of Breath 0 -> 5 scale with 5 being worst (score 6 If unable to do)  At rest 0 2 0 2   Simple tasks - showers, clothes change, eating, shaving 0 2 0 3   Household (dishes, doing bed, laundry) 0 3 2 2    Shopping 1 3 2 2    Walking level at own pace 2 3 1 3    Walking up Stairs 4 2 3 4    Total (30-36) Dyspnea Score 7 15 8 18    How bad is your cough? 1 1 2 2    How bad is your fatigue 4 4 4 4    How bad is nausea 0 4 2 1    How bad is vomiting?  0 0 2 1   How bad  is diarrhea? 2 4 4  - mestinon  3   How bad is anxiety? 2 2 2 4    How bad is depression 2 1 2 3           Simple office walk 185 feet x  3 laps goal with forehead probe 03/03/2018  06/02/2018  08/31/2019  01/13/2020  07/24/2020  07/16/2022   O2 used Room air Room air Room air ra ra ra  Number laps completed 3 3 3 3  3   Comments about pace normal normal normal  avg pace   Resting Pulse Ox/HR 100% and 66/min 98% and 72/min 99% and 74/min 98% and 62/min 98% and 76 97% and HR 61  Final Pulse Ox/HR 99% and 108/min 96% and 57/mn 98% and 106/min 91% and 74/min 99% and 119 975 and HR 111  Desaturated </= 88% no no no no no   Desaturated <= 3% points no no no Yes, 7 points no   Got Tachycardic >/= 90/min yes no yes no    Symptoms at end of test x Mild dyspnea Dyspnea at 3rd lap No dyspnea Mild dysonea Mod dyspnea  Miscellaneous comments x           Allergies  Allergen Reactions   Celebrex [Celecoxib] Hives and Swelling    SWELLING REACTION UNSPECIFIED    Vioxx [Rofecoxib] Hives   Latex Rash    Immunization History  Administered Date(s) Administered   Fluad Quad(high Dose 65+) 01/28/2019   INFLUENZA, HIGH DOSE SEASONAL PF 03/08/2015, 01/27/2017, 02/20/2017, 01/27/2018, 01/13/2020   Influenza Split 02/02/2016   Influenza-Unspecified 02/25/2014   Moderna Sars-Covid-2 Vaccination 05/04/2019, 06/10/2019, 07/09/2019, 07/27/2019, 04/24/2020, 09/14/2020   Pneumococcal Conjugate-13 06/01/2015   Pneumococcal Polysaccharide-23 01/28/2012   Pneumococcal-Unspecified 06/04/2013   Tdap 04/29/2009, 11/13/2020   Zoster Recombinant(Shingrix) 12/06/2019    Past Medical History:  Diagnosis Date   Arthritis    Bursitis    Diabetes mellitus without complication (HCC)    Dyspnea    Fibromyalgia    GERD (gastroesophageal reflux disease)    Hyperlipemia    Hypertension    Myasthenia gravis (HCC)    ocular   Sinusitis    Sleep apnea    uses a cpap   Sleep apnea    Wears glasses     Tobacco  History: Social History   Tobacco Use  Smoking Status Never  Smokeless Tobacco Never   Counseling given: Not Answered   Outpatient Medications Prior to Visit  Medication Sig Dispense Refill   APPLE CIDER VINEGAR PO Take 1 tablet by mouth daily.      Biotin w/ Vitamins C & E (HAIR/SKIN/NAILS PO) Take 1 tablet by mouth daily.     cetirizine (ZYRTEC) 10 MG tablet Take 10 mg by mouth at bedtime.   0   Co-Enzyme Q-10 100 MG CAPS Take  100 mg by mouth daily.      diclofenac sodium (VOLTAREN) 1 % GEL Apply 1 application topically 3 (three) times daily as needed (arthritis).      estradiol  (ESTRACE ) 0.1 MG/GM vaginal cream Place 0.5 g vaginally 2 (two) times a week. Place 0.5g nightly for two weeks then twice a week after 42 g 11   gabapentin (NEURONTIN) 300 MG capsule Take 300 mg by mouth at bedtime.      Ginger, Zingiber officinalis, (GINGER ROOT) 550 MG CAPS Take 550 mg by mouth daily.      glipiZIDE (GLUCOTROL XL) 10 MG 24 hr tablet Take 10 mg by mouth 2 (two) times daily.      ipratropium (ATROVENT) 0.06 % nasal spray Place 2 sprays into both nostrils 3 (three) times daily.     levOCARNitine Fumarate 500 MG TABS Take 500 mg by mouth daily.      losartan-hydrochlorothiazide (HYZAAR) 100-25 MG tablet Take 1 tablet by mouth every evening.      metFORMIN (GLUCOPHAGE-XR) 500 MG 24 hr tablet Take 1,000 mg by mouth 2 (two) times daily with a meal.      metoprolol tartrate (LOPRESSOR) 25 MG tablet Take 12.5 mg by mouth daily.     mirabegron  ER (MYRBETRIQ ) 50 MG TB24 tablet Take 1 tablet (50 mg total) by mouth daily. 90 tablet 3   Misc Natural Products (TART CHERRY ADVANCED) CAPS Take 1 capsule by mouth 2 (two) times daily.      multivitamin-lutein (OCUVITE-LUTEIN) CAPS capsule Take 1 capsule by mouth daily.     omega-3 acid ethyl esters (LOVAZA) 1 g capsule Take 1 g by mouth daily.     pregabalin (LYRICA) 50 MG capsule Take 50 mg by mouth 2 (two) times daily.      pyridostigmine  (MESTINON ) 60 MG  tablet Take 1 tablet (60 mg total) by mouth 3 (three) times daily. 270 tablet 1   Rosuvastatin Calcium 10 MG CPSP Take 5 mg by mouth at bedtime.      Turmeric Curcumin 500 MG CAPS Take 500 mg by mouth every evening.      VITAMIN D PO Take by mouth.     No facility-administered medications prior to visit.      Review of Systems  Review of Systems   Physical Exam  There were no vitals taken for this visit. Physical Exam  ***  Lab Results:  CBC    Component Value Date/Time   WBC 7.5 07/16/2022 0953   RBC 3.72 (L) 07/16/2022 0953   HGB 12.0 07/16/2022 0953   HCT 34.9 (L) 07/16/2022 0953   PLT 251.0 07/16/2022 0953   MCV 93.8 07/16/2022 0953   MCH 30.4 07/06/2018 1545   MCHC 34.3 07/16/2022 0953   RDW 12.6 07/16/2022 0953   LYMPHSABS 2.7 07/16/2022 0953   MONOABS 0.7 07/16/2022 0953   EOSABS 0.4 07/16/2022 0953   BASOSABS 0.1 07/16/2022 0953    BMET    Component Value Date/Time   NA 139 07/06/2018 1545   K 3.5 07/06/2018 1545   CL 109 07/06/2018 1545   CO2 19 (L) 07/06/2018 1545   GLUCOSE 92 07/06/2018 1545   BUN 16 07/06/2018 1545   CREATININE 0.67 07/06/2018 1545   CALCIUM 10.0 07/06/2018 1545   GFRNONAA >60 07/06/2018 1545   GFRAA >60 07/06/2018 1545    BNP No results found for: BNP  ProBNP No results found for: PROBNP  Imaging: No results found.   Assessment & Plan:   No  problem-specific Assessment & Plan notes found for this encounter.   There are no diagnoses linked to this encounter.  Assessment and Plan Assessment & Plan       I personally spent a total of *** minutes in the care of the patient today including {Time Based Coding:[id number]}.   Almarie LELON Ferrari, NP 02/23/2024

## 2024-02-24 ENCOUNTER — Ambulatory Visit: Admitting: Adult Health

## 2024-02-24 ENCOUNTER — Encounter: Payer: Self-pay | Admitting: Adult Health

## 2024-02-24 ENCOUNTER — Ambulatory Visit

## 2024-02-24 VITALS — BP 144/80 | HR 75 | Temp 97.6°F | Ht 62.0 in | Wt 176.2 lb

## 2024-02-24 DIAGNOSIS — G7000 Myasthenia gravis without (acute) exacerbation: Secondary | ICD-10-CM | POA: Diagnosis not present

## 2024-02-24 DIAGNOSIS — Z6832 Body mass index (BMI) 32.0-32.9, adult: Secondary | ICD-10-CM

## 2024-02-24 DIAGNOSIS — J8410 Pulmonary fibrosis, unspecified: Secondary | ICD-10-CM

## 2024-02-24 DIAGNOSIS — R5381 Other malaise: Secondary | ICD-10-CM

## 2024-02-24 DIAGNOSIS — J479 Bronchiectasis, uncomplicated: Secondary | ICD-10-CM | POA: Diagnosis not present

## 2024-02-24 DIAGNOSIS — J849 Interstitial pulmonary disease, unspecified: Secondary | ICD-10-CM | POA: Diagnosis not present

## 2024-02-24 DIAGNOSIS — Z981 Arthrodesis status: Secondary | ICD-10-CM | POA: Diagnosis not present

## 2024-02-24 NOTE — Progress Notes (Signed)
 @Patient  ID: [name] Fam, female    DOB: 18-Oct-1945, 78 y.o.   MRN: [id number]  Chief Complaint  Patient presents with   Interstitial Lung Disease    F/U    Referring provider: Rosamond Leta NOVAK, MD  HPI: 78 year old female never smoker followed for interstitial lung disease    TEST/EVENTS : Reviewed 02/24/2024  High-resolution CT chest Sep 17, 2022 basilar predominant fibrotic interstitial lung disease without frank honeycombing mild patchy air trapping no interval progression since 2021, mild progression from baseline in 2017 favor fibrotic NSIP, indeterminate for UIP  PFTs March 2022 FEV1 113%, ratio 90, FVC 94%, DLCO 95% Pulmonary function testing January 13, 2024 shows FEV1 at 108%, ratio 86, FVC 94%, DLCO 74%.  Discussed the use of AI scribe software for clinical note transcription with the patient, who gave verbal consent to proceed.  History of Present Illness [name] is a 78 year old female with interstitial lung disease who presents for follow-up. She was last seen in March 2024.  She is accompanied by her husband.  She has a history of interstitial lung disease  . She attends maintenance pulmonary rehab sessions twice a week, which she finds beneficial. She experiences some coughing but not excessively. No shortness of breath at rest, but it occurs occasionally when walking. She is able to perform light housework and does not use supplemental oxygen.  PFT done on 12/2023 shows no significant airflow obstruction or restriction. Decrease in diffusing capacity compared to 2022.   She has a history of myasthenia gravis and is on Mestinon , She experienced a concussion after a fall, which has affected her short-term memory.      Allergies  Allergen Reactions   Celebrex [Celecoxib] Hives and Swelling    SWELLING REACTION UNSPECIFIED    Vioxx [Rofecoxib] Hives   Latex Rash    Immunization History  Administered Date(s) Administered   Fluad Quad(high Dose  65+) 01/28/2019   INFLUENZA, HIGH DOSE SEASONAL PF 03/08/2015, 01/27/2017, 02/20/2017, 01/27/2018, 01/13/2020   Influenza Split 02/02/2016   Influenza-Unspecified 02/25/2014   Moderna Sars-Covid-2 Vaccination 05/04/2019, 06/10/2019, 07/09/2019, 07/27/2019, 04/24/2020, 09/14/2020   Pneumococcal Conjugate-13 06/01/2015   Pneumococcal Polysaccharide-23 01/28/2012   Pneumococcal-Unspecified 06/04/2013   Tdap 04/29/2009, 11/13/2020   Zoster Recombinant(Shingrix) 12/06/2019    Past Medical History:  Diagnosis Date   Arthritis    Bursitis    Diabetes mellitus without complication (HCC)    Dyspnea    Fibromyalgia    GERD (gastroesophageal reflux disease)    Hyperlipemia    Hypertension    Myasthenia gravis (HCC)    ocular   Sinusitis    Sleep apnea    uses a cpap   Sleep apnea    Wears glasses     Tobacco History: Social History   Tobacco Use  Smoking Status Never  Smokeless Tobacco Never   Counseling given: Not Answered   Outpatient Medications Prior to Visit  Medication Sig Dispense Refill   Continuous Glucose Sensor (DEXCOM G7 SENSOR) MISC SMARTSIG:Every 10 Days     gabapentin (NEURONTIN) 300 MG capsule Take 300 mg by mouth at bedtime.      glipiZIDE (GLUCOTROL XL) 10 MG 24 hr tablet Take 10 mg by mouth 2 (two) times daily.      losartan-hydrochlorothiazide (HYZAAR) 100-25 MG tablet Take 1 tablet by mouth every evening.      metFORMIN (GLUCOPHAGE-XR) 500 MG 24 hr tablet Take 1,000 mg by mouth 2 (two) times daily with a meal.  mirabegron  ER (MYRBETRIQ ) 50 MG TB24 tablet Take 1 tablet (50 mg total) by mouth daily. 90 tablet 3   omega-3 acid ethyl esters (LOVAZA) 1 g capsule Take 1 g by mouth daily.     pregabalin (LYRICA) 50 MG capsule Take 50 mg by mouth 2 (two) times daily.      VITAMIN D PO Take by mouth.     APPLE CIDER VINEGAR PO Take 1 tablet by mouth daily.  (Patient not taking: Reported on 02/24/2024)     Biotin w/ Vitamins C & E (HAIR/SKIN/NAILS PO) Take 1  tablet by mouth daily. (Patient not taking: Reported on 02/24/2024)     cetirizine (ZYRTEC) 10 MG tablet Take 10 mg by mouth at bedtime.  (Patient not taking: Reported on 02/24/2024)  0   Co-Enzyme Q-10 100 MG CAPS Take 100 mg by mouth daily.  (Patient not taking: Reported on 02/24/2024)     diclofenac sodium (VOLTAREN) 1 % GEL Apply 1 application topically 3 (three) times daily as needed (arthritis).  (Patient not taking: Reported on 02/24/2024)     estradiol  (ESTRACE ) 0.1 MG/GM vaginal cream Place 0.5 g vaginally 2 (two) times a week. Place 0.5g nightly for two weeks then twice a week after (Patient not taking: Reported on 02/24/2024) 42 g 11   Ginger, Zingiber officinalis, (GINGER ROOT) 550 MG CAPS Take 550 mg by mouth daily.  (Patient not taking: Reported on 02/24/2024)     ipratropium (ATROVENT) 0.06 % nasal spray Place 2 sprays into both nostrils 3 (three) times daily. (Patient not taking: Reported on 02/24/2024)     LANTUS SOLOSTAR 100 UNIT/ML Solostar Pen SMARTSIG:30 Unit(s) SUB-Q Every Evening     levOCARNitine Fumarate 500 MG TABS Take 500 mg by mouth daily.  (Patient not taking: Reported on 02/24/2024)     metoprolol tartrate (LOPRESSOR) 25 MG tablet Take 12.5 mg by mouth daily. (Patient not taking: Reported on 02/24/2024)     Misc Natural Products (TART CHERRY ADVANCED) CAPS Take 1 capsule by mouth 2 (two) times daily.  (Patient not taking: Reported on 02/24/2024)     multivitamin-lutein (OCUVITE-LUTEIN) CAPS capsule Take 1 capsule by mouth daily. (Patient not taking: Reported on 02/24/2024)     pyridostigmine  (MESTINON ) 60 MG tablet Take 1 tablet (60 mg total) by mouth 3 (three) times daily. (Patient not taking: Reported on 02/24/2024) 270 tablet 1   Rosuvastatin Calcium 10 MG CPSP Take 5 mg by mouth at bedtime.  (Patient not taking: Reported on 02/24/2024)     Turmeric Curcumin 500 MG CAPS Take 500 mg by mouth every evening.  (Patient not taking: Reported on 02/24/2024)     No  facility-administered medications prior to visit.     Review of Systems:   Constitutional:   No  weight loss, night sweats,  Fevers, chills, +fatigue, or  lassitude.  HEENT:   No headaches,  Difficulty swallowing,  Tooth/dental problems, or  Sore throat,                No sneezing, itching, ear ache, nasal congestion, post nasal drip,   CV:  No chest pain,  Orthopnea, PND, swelling in lower extremities, anasarca, dizziness, palpitations, syncope.   GI  No heartburn, indigestion, abdominal pain, nausea, vomiting, diarrhea, change in bowel habits, loss of appetite, bloody stools.   Resp:   No chest wall deformity  Skin: no rash or lesions.  GU: no dysuria, change in color of urine, no urgency or frequency.  No flank pain, no  hematuria   MS:  No joint pain or swelling.  No decreased range of motion.  No back pain.    Physical Exam  BP (!) 152/72   Pulse 75   Temp 97.6 F (36.4 C)   Ht 5' 2 (1.575 m)   Wt 176 lb 3.2 oz (79.9 kg)   SpO2 98% Comment: RA  BMI 32.23 kg/m   GEN: A/Ox3; pleasant , NAD, well nourished    HEENT:  [place]/AT,  NOSE-clear, THROAT-clear, no lesions, no postnasal drip or exudate noted.   NECK:  Supple w/ fair ROM; no JVD; normal carotid impulses w/o bruits; no thyromegaly or nodules palpated; no lymphadenopathy.    RESP  Clear  P & A; w/o, wheezes/ rales/ or rhonchi. no accessory muscle use, no dullness to percussion  CARD:  RRR, no m/r/g, no peripheral edema, pulses intact, no cyanosis or clubbing.  GI:   Soft & nt; nml bowel sounds; no organomegaly or masses detected.   Musco: Warm bil, no deformities or joint swelling noted.   Neuro: alert, no focal deficits noted.    Skin: Warm, no lesions or rashes    Lab Results:Reviewed 02/24/2024   CBC  No results found for: BNP  ProBNP No results found for: PROBNP  Imaging: No results found.  Administration History     None          Latest Ref Rng & Units 01/13/2024   12:52 PM  07/24/2020    3:03 PM 08/10/2019   10:53 AM 06/02/2018   12:49 PM 06/03/2017   11:05 AM 12/03/2016    9:51 AM  PFT Results  FVC-Pre L 2.46  2.61  2.47  2.59  2.66  2.66  P  FVC-Predicted Pre % 94  94  88  91  93  92  P  FVC-Post L      2.49  P  FVC-Predicted Post %      86  P  Pre FEV1/FVC % % 86  90  92  92  89  90  P  Post FEV1/FCV % %      95  P  FEV1-Pre L 2.12  2.35  2.27  2.38  2.38  2.39  P  FEV1-Predicted Pre % 108  113  107  110  110  110  P  FEV1-Post L      2.35  P  DLCO uncorrected ml/min/mmHg 13.67  17.75  17.01  15.58  16.35  17.17  P  DLCO UNC% % 74  95  91  83  71  74  P  DLCO corrected ml/min/mmHg  17.75  17.52    18.34  P  DLCO COR %Predicted %  95  93    80  P  DLVA Predicted % 87  109  101  90  84  98  P  TLC L      4.43  P  TLC % Predicted %      90  P  RV % Predicted %      79  P    P Preliminary result    No results found for: NITRICOXIDE      No data to display              Assessment & Plan:   Assessment and Plan Assessment & Plan Interstitial lung disease -favor fibrotic NSIP on CT imaging Appears to be clinically stable.  Pulmonary function testing does show a slight drop in her  diffusing capacity however she is not oxygen dependent and has improved clinical symptoms and improved activity tolerance with ongoing maintenance pulmonary rehab.  oxygen levels remain good without supplemental oxygen. Pulmonary therapy has improved symptoms and activity tolerance. No acute exacerbation is noted. Order a chest x-ray today  Repeat pulmonary function test in six months to monitor lung function. Continue pulmonary therapy due to its significant benefits. If the chest x-ray shows worsening, consider an earlier CT scan.  Myasthenia gravis  -appears clinically stable.  She is on Mestinon  and reports stable muscle function. Continue the current medication regimen with Mestinon  and follow-up with primary care and neurology.  Physical deconditioning continue with  maintenance pulmonary rehab.  Continue activity as tolerated  BMI-32.  Continue with healthy weight management.  Plan  Patient Instructions  Chest xray today.  PFT in 6 months -Spirometry with DLCO Continue with pulmonary rehab Follow up with Dr. Geronimo or Dnasia Gauna NP in 6 months and As needed            Madelin Stank, NP 02/24/2024  I spent 38   minutes dedicated to the care of this patient on the date of this encounter to include pre-visit review of records, face-to-face time with the patient discussing conditions above, post visit ordering of testing, clinical documentation with the electronic health record, making appropriate referrals as documented, and communicating necessary findings to members of the patients care team.

## 2024-02-24 NOTE — Patient Instructions (Addendum)
 Chest xray today.  PFT in 6 months -Spirometry with DLCO Continue with pulmonary rehab Follow up with Dr. Geronimo or Daniela Siebers NP in 6 months and As needed

## 2024-02-25 ENCOUNTER — Ambulatory Visit: Payer: Self-pay | Admitting: Adult Health

## 2024-03-23 ENCOUNTER — Ambulatory Visit: Admitting: Obstetrics and Gynecology

## 2024-04-09 ENCOUNTER — Ambulatory Visit: Admitting: Obstetrics and Gynecology

## 2024-04-09 ENCOUNTER — Other Ambulatory Visit: Payer: Self-pay | Admitting: Adult Health

## 2024-04-19 ENCOUNTER — Telehealth: Payer: Self-pay | Admitting: Adult Health

## 2024-04-19 MED ORDER — PYRIDOSTIGMINE BROMIDE 60 MG PO TABS: 60.0000 mg | ORAL_TABLET | Freq: Three times a day (TID) | ORAL | 0 refills | Status: DC

## 2024-04-19 NOTE — Telephone Encounter (Signed)
 Pt husband request medication  refill We have Scheduled Pt a follow up appt , Pt Husband wants to know is there a way for PT to get medication before appt    pyridostigmine  (MESTINON ) 60 MG tablet [name] - [place], [place] - [address] Phone: [phone]  Fax: [phone]

## 2024-04-19 NOTE — Telephone Encounter (Signed)
 Spoke to husband (checked DPR) Per Jessica,NP ok to fill medication to get her to f/u visit 04/2024 Husband was very appreciative

## 2024-05-04 NOTE — Progress Notes (Unsigned)
 "  [facility]  PATIENT: [name] DOB: 03-12-1946   REASON FOR VISIT: Follow-up for ocular myasthenia HISTORY FROM: Patient   HISTORY:  [name] is a 79 y.o. 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 Rosemarie with initial visit 02/2016    Consult 03/07/16 Dr. Rosemarie;  [name] is a 43 year pleasant Caucasian lady whose had droopy eyelids first started in December 2016 when she stated states that I was drooping and she will barely open it. She was seen by primary physician who thought she had partial Bell's palsy. This did not get better and subsequently in June she saw ophthalmologist who noticed that she had some infection on the lower eyelid treated with antibiotics. Since then she started that she has intermittent opening of both eyelids left more than right. There are times when arises is completely shut and on other times she can see well. In fact on inquiry she states that a couple of occasion while driving she was seeing double the dividing lines of the road for crossing over. She is also noticed that she gets fatigued and tired very easily. Initially when this first began this was thought to be related to viral infection but this does not seem to be getting better. She denies any difficulty moving her eyes, any dysphagia, weakness in her arms or legs. She has no trouble getting out, chair or climbing up and down steps. She has a good appetite and she had not lost any weight. Review of electronic medical record shows that she had elevated acetylcholine receptor antibodies on 01/15/16 with acetylcholine binding antibody of 6.16, blocking antibody 31 and modulating antibody 48 all of which are abnormal. Patient has not been tried on Mestinon  on other medications for myasthenia. She denies any history of headaches, strokes, TIAs or other neurologic problems.  Update 1/16/2018PS : She returns for follow-up after  last visit 2 months ago. She states she has noticed benefit on Mestinon  and eyes do not droop as much and she is not had any episodes of diplopia. She doesn't feel tired as much as well. However she was unable to tolerate 60 mg dose of Mestinon  due to stomach cramps, nausea and is taking only 30 mg mostly 3 times a day and takes an occasional fourth dose if needed. She did undergo nerve conduction study with rapid repetitive stimulation on 04/10/16 which was negative for decremental response. The patient is reluctant to consider prednisone given her diabetes and risk of blood sugars being elevated on it  UPDATE 04/19/2018CM [name], 79 year old female returns for follow-up with history of ocular myasthenia. She is currently on Mestinon  sometimes taking a full pill but mostly half pills due to nausea. She is a diabetic so she does not want to take prednisone. She has had no double vision blurred vision or any drooping of her left eye. Vision today 20/50 right 20/40 left.She denies any difficulty moving her eyes, any dysphagia, weakness in her arms or legs. She has no trouble getting out, chair or climbing up and down steps.  She continues to be active. She returns for reevaluation. She is requesting something for nausea UPDATE 10/17/2018CM  [name], 79 year old female returns for follow-up with a history of ocular myasthenia with symptoms beginning in June 2016. She denies any episodes of diplopia, no ptosis seen today. Vision today 2070 bilaterally. She denies any weakness in her arms or legs or any problems with breathing she continues  to be quite active. She continues to drive without difficulty she had 1 recent fall no injury.  She had a recent visit to her ophthalmologist. She is currently on Mestinon  sometimes takes a full pill and sometimes a half pill. She has nausea but  Zofran  did not help, she returns for reevaluation. Recently diagnosed with arthritis. Update 08/19/2017 :she returns for follow-up  after last visit 6 months ago. She states her cholesterol myasthenia symptoms are well controlled. She denies any drooping of the eyelids, diplopia, muscle fatigue or weakness. She states she is tired all the time but this may be related to sleep apnea and lack of exercise. She is currently on Mestinon  30 mg 4 times daily and is tolerating it quite well. She was unable to tolerate 60 mg due to diarrhea and upset stomach. She has no new complaints today. She does complain of some intermittent trouble swallowing when she thinks she swallows too quickly at times gets stuck in the middle of the chest. She denies any nasal regurg, nasal twang to her speech heart food being stuck in the back of her mouth.She states her blood pressure and sugar control have been quite good. 10/25/2019 : She returns for follow-up after last visit 2 years ago.  She states she was doing fine with her ptosis and myasthenia until 2 weeks ago when she is noticed increased droopiness of the left eye particularly towards the afternoon.  In the mornings her eyes seem much more open.  She called Dr. Jenel who recommended increasing her Mestinon  which she was taking 30 mg 4 times daily to 60 mg 3 times daily.  She has not noticed any specific benefit.  She does have mild diarrhea and nausea but is tolerating it otherwise well.  Patient in the past has not been able to tolerate higher doses of Mestinon .  Dr. Trudy did discuss adding prednisone but the patient is reluctant to try this because of her diabetes and worsening sugars.  Patient denies any weakness in her extremities, significant fatigue or tiredness.  She denies any recent respiratory illness in the form of infection.  She did lose her balance 1 she tripped but did not hurt herself.  She does have some intermittent diplopia particularly with sustained upgaze.    Update 12/21/2019 JM: Crystal. Colclasure returns today for 62-month follow-up regarding ocular myasthenia After prior visit,  complaints of worsening left eye drooping Dr. Rosemarie recommended increasing Mestinon  dosage to 60 mg 4 times daily Reports improvement of left eye symptoms but continues to have some droopiness especially when outdoors or in bright light and diplopia if both eyes are open Side effects of diarrhea and nausea with benefit with use of Imodium and a  nausea medication that PCP started her on -she is unable to state name She is frustrated regarding continued symptoms and she has not been able to return to driving Denies any extremity weakness, fatigue or imbalance She was reluctantly placed on prednisone in July due to severe back and hip pain Reports benefit of left eye symptoms and did not notice impact on her glucose levels No further concerns  Update 03/28/2020 Dr. Rosemarie: She returns for follow-up after last visit with Crystal nurse practitioner 3 months ago.  She continues to have mild drooping of the left eye which is intermittent and more pronounced when she is tired and exhausted or excited.  She is taking Mestinon  60 mg 4 times daily but is having some trouble with diarrhea and upset stomach  and occasional cramps particularly during the early afternoon.  She has refused to take prednisone because of the long-term side effects.  She had a flareup of her back pain in August and was treated with a short course of prednisone which she feels transiently helped her myasthenic symptoms.  She also had a flareup of her sinus infection in October and took another short course of prednisone.  She plans to have cataract surgery soon.  She has given up driving because of fear of her eye closure.  Update 07/20/2020 JM: Returns for 4 months follow-up regarding ocular myasthenia  Continues to experience mild left eye drooping and diplopia but some improvement since prior that Currently on Mestinon  60mg  3 times daily - reports improvement of diarrhea, nausea and cramps after decreasing from 4 times to 3 times  daily dosing -thankfully symptoms have not worsened but actually improved  S/p cataract 12/2 right eye; 12/22 left eye Has been more sensitive to light since then  No new concerns at this time  Update 05/30/2021 JM:  Returns for follow-up visit after prior visit almost 1 year ago.  Reports improvement since prior visit. Only minimal left eye drooping currently and intermittent diplopia which can worsen after looking at something for too long or with low light. She has even returned back to driving without difficulty.  Continues on Mestinon  60 mg 3 times daily. Continued GI side effects but these have been stable. Continued use of Imodium PRN with benefit.   Update 05/30/2022 JM: Patient returns for 1 year follow-up. Symptoms have been stable over the past year. Can still have some worsening symptoms with bright light or fatigue but doesn't occur often.  Continues on Mestinon  60 mg 3 times daily. Continued GI side effects, use of Imodium daily and metamucil with benefit. No further concerns at this time.      UPDATE 05/05/2024 JM:  Patient returns for follow-up visit after prior visit almost 2 years ago.  Reports overall stable since prior visit and continues on Mestinon  60 mg 3 times daily.          REVIEW OF SYSTEMS: Full 14 system review of systems performed and notable only for those listed, all others are neg: Intermittent diplopia, drooping of the left eyelid only. All systems negative    ALLERGIES: Allergies  Allergen Reactions   Celebrex [Celecoxib] Hives and Swelling    SWELLING REACTION UNSPECIFIED    Vioxx [Rofecoxib] Hives   Latex Rash    HOME MEDICATIONS: Outpatient Medications Prior to Visit  Medication Sig Dispense Refill   APPLE CIDER VINEGAR PO Take 1 tablet by mouth daily.  (Patient not taking: Reported on 02/24/2024)     Biotin w/ Vitamins C & E (HAIR/SKIN/NAILS PO) Take 1 tablet by mouth daily. (Patient not taking: Reported on 02/24/2024)     cetirizine  (ZYRTEC) 10 MG tablet Take 10 mg by mouth at bedtime.  (Patient not taking: Reported on 02/24/2024)  0   Co-Enzyme Q-10 100 MG CAPS Take 100 mg by mouth daily.  (Patient not taking: Reported on 02/24/2024)     Continuous Glucose Sensor (DEXCOM G7 SENSOR) MISC SMARTSIG:Every 10 Days     diclofenac sodium (VOLTAREN) 1 % GEL Apply 1 application topically 3 (three) times daily as needed (arthritis).  (Patient not taking: Reported on 02/24/2024)     estradiol  (ESTRACE ) 0.1 MG/GM vaginal cream Place 0.5 g vaginally 2 (two) times a week. Place 0.5g nightly for two weeks then twice a week after (Patient  not taking: Reported on 02/24/2024) 42 g 11   gabapentin (NEURONTIN) 300 MG capsule Take 300 mg by mouth at bedtime.      Ginger, Zingiber officinalis, (GINGER ROOT) 550 MG CAPS Take 550 mg by mouth daily.  (Patient not taking: Reported on 02/24/2024)     glipiZIDE (GLUCOTROL XL) 10 MG 24 hr tablet Take 10 mg by mouth 2 (two) times daily.      ipratropium (ATROVENT) 0.06 % nasal spray Place 2 sprays into both nostrils 3 (three) times daily. (Patient not taking: Reported on 02/24/2024)     LANTUS SOLOSTAR 100 UNIT/ML Solostar Pen SMARTSIG:30 Unit(s) SUB-Q Every Evening     levOCARNitine Fumarate 500 MG TABS Take 500 mg by mouth daily.  (Patient not taking: Reported on 02/24/2024)     losartan-hydrochlorothiazide (HYZAAR) 100-25 MG tablet Take 1 tablet by mouth every evening.      metFORMIN (GLUCOPHAGE-XR) 500 MG 24 hr tablet Take 1,000 mg by mouth 2 (two) times daily with a meal.      metoprolol tartrate (LOPRESSOR) 25 MG tablet Take 12.5 mg by mouth daily. (Patient not taking: Reported on 02/24/2024)     mirabegron  ER (MYRBETRIQ ) 50 MG TB24 tablet Take 1 tablet (50 mg total) by mouth daily. 90 tablet 3   Misc Natural Products (TART CHERRY ADVANCED) CAPS Take 1 capsule by mouth 2 (two) times daily.  (Patient not taking: Reported on 02/24/2024)     multivitamin-lutein (OCUVITE-LUTEIN) CAPS capsule Take 1  capsule by mouth daily. (Patient not taking: Reported on 02/24/2024)     omega-3 acid ethyl esters (LOVAZA) 1 g capsule Take 1 g by mouth daily.     pregabalin (LYRICA) 50 MG capsule Take 50 mg by mouth 2 (two) times daily.      pyridostigmine  (MESTINON ) 60 MG tablet Take 1 tablet (60 mg total) by mouth 3 (three) times daily. 90 tablet 0   Rosuvastatin Calcium 10 MG CPSP Take 5 mg by mouth at bedtime.  (Patient not taking: Reported on 02/24/2024)     Turmeric Curcumin 500 MG CAPS Take 500 mg by mouth every evening.  (Patient not taking: Reported on 02/24/2024)     VITAMIN D PO Take by mouth.     No facility-administered medications prior to visit.    PAST MEDICAL HISTORY: Past Medical History:  Diagnosis Date   Arthritis    Bursitis    Diabetes mellitus without complication (HCC)    Dyspnea    Fibromyalgia    GERD (gastroesophageal reflux disease)    Hyperlipemia    Hypertension    Myasthenia gravis (HCC)    ocular   Sinusitis    Sleep apnea    uses a cpap   Sleep apnea    Wears glasses     PAST SURGICAL HISTORY: Past Surgical History:  Procedure Laterality Date   ABDOMINAL HYSTERECTOMY  1999   BREAST SURGERY  2002   rt br bx   CERVICAL FUSION  2007   CHOLECYSTECTOMY  2005   lap choli   COLONOSCOPY     FINGER GANGLION CYST EXCISION     INCISION AND DRAINAGE ABSCESS     Staph infection with multiple abdominal wounds s/p I&D in [place] ~ 2014   SHOULDER ARTHROSCOPY WITH OPEN ROTATOR CUFF REPAIR AND DISTAL CLAVICLE ACROMINECTOMY Right 09/09/2012   Procedure: Right Shoulder Arthroscopy with distal clavicle excision and mini-open rotator cuff repair.;  Surgeon: Norleen LITTIE Gavel, MD;  Location: [facility];  Service: Orthopedics;  Laterality: Right;   TONSILLECTOMY      FAMILY HISTORY: Family History  Problem Relation Age of Onset   Emphysema Father    Intracerebral hemorrhage Sister    Myasthenia gravis Neg Hx        none that she knows of     SOCIAL  HISTORY: Social History   Socioeconomic History   Marital status: Married    Spouse name: Not on file   Number of children: Not on file   Years of education: Not on file   Highest education level: Not on file  Occupational History   Occupation: Retired  Tobacco Use   Smoking status: Never   Smokeless tobacco: Never  Vaping Use   Vaping status: Never Used  Substance and Sexual Activity   Alcohol  use: No   Drug use: No   Sexual activity: Not Currently  Other Topics Concern   Not on file  Social History Narrative   Lives at home with husband   Right handed   Caffeine: maybe 1 cup/day   Social Drivers of Health   Tobacco Use: Low Risk (02/24/2024)   Patient History    Smoking Tobacco Use: Never    Smokeless Tobacco Use: Never    Passive Exposure: Not on file  Financial Resource Strain: Not on file  Food Insecurity: Not on file  Transportation Needs: No Transportation Needs (05/01/2021)   Received from [facility]   PRAPARE - Transportation    Lack of Transportation (Medical): No    Lack of Transportation (Non-Medical): No  Physical Activity: Not on file  Stress: Not on file  Social Connections: Unknown (09/07/2021)   Received from [facility]   Social Network    Social Network: Not on file  Intimate Partner Violence: Unknown (07/30/2021)   Received from [facility]   HITS    Physically Hurt: Not on file    Insult or Talk Down To: Not on file    Threaten Physical Harm: Not on file    Scream or Curse: Not on file  Depression (EYV7-0): Not on file  Alcohol  Screen: Not on file  Housing: Not on file  Utilities: Not on file  Health Literacy: Not on file     PHYSICAL EXAM  There were no vitals filed for this visit.  There is no height or weight on file to calculate BMI.  Generalized: Very pleasant elderly Caucasian lady,Obese female in no acute distress  Head: normocephalic and atraumatic,.   Neck: Supple, no carotid bruits  Cardiac: Regular rate rhythm,  no murmur  Musculoskeletal: No deformity   Neurological examination   Mentation: Alert oriented to time, place, history taking. Attention span and concentration appropriate. Recent and remote memory intact.  Follows all commands speech and language fluent.  Cranial nerve II-XII: Pupils were equal round reactive to light extraocular movements were full, no nystagmus, left eye mild drooping at rest which increases with sustained upgaze for 1 minute.  No diplopia and ophthalmoplegia on exam. Facial sensation and strength were normal. hearing was intact to finger rubbing bilaterally. Uvula tongue midline. head turning and shoulder shrug were normal and symmetric.Tongue protrusion into cheek strength was normal. Motor: normal bulk and tone, full strength in the BUE, BLE, except decreased weakness distally right  lower extremity,  fine finger movements normal Sensory: normal and symmetric to light touch, pinprick, and  Vibration, in the upper and lower extremities  Coordination: finger-nose-finger, heel-to-shin bilaterally, no dysmetria Reflexes: 1+ upper lower and symmetric, plantar responses  were flexor bilaterally. Gait and Station: Rising up from seated position without assistance, normal stance,  moderate stride, good arm swing, smooth turning. Ambulates without assistive device     ASSESSMENT AND PLAN 52 year Caucasian lady with gradual onset drooping of eyelids in December 2016 due to ocular myasthenia. She has positive acetylcholine receptor antibodies. She has some GI intolerance to Mestinon  but stable on Imodium and Metamucil    Ocular myasthenia -Continue Mestinon  60 mg 3 times daily -refill provided, intolerant to higher doses -Continue Imodium as needed and Metamucil -Advised her to call with any worsening of symptoms or flare ups,  if this occurs, would recommend trial of prednisone as intolerant to higher dosages of Mestinon     Follow-up in 1 year or call earlier if  needed    CC:  Rosamond Leta NOVAK, MD     [name], AGNP-BC  [facility] [address] [place], [place] [address]  Phone [phone] Fax [phone] Note: This document was prepared with digital dictation and possible smart phrase technology. Any transcriptional errors that result from this process are unintentional. "

## 2024-05-05 ENCOUNTER — Encounter: Payer: Self-pay | Admitting: Adult Health

## 2024-05-05 ENCOUNTER — Ambulatory Visit: Admitting: Adult Health

## 2024-05-05 ENCOUNTER — Telehealth: Payer: Self-pay | Admitting: Adult Health

## 2024-05-05 VITALS — BP 135/70 | HR 81 | Ht 64.0 in | Wt 172.2 lb

## 2024-05-05 DIAGNOSIS — M542 Cervicalgia: Secondary | ICD-10-CM

## 2024-05-05 DIAGNOSIS — G7000 Myasthenia gravis without (acute) exacerbation: Secondary | ICD-10-CM | POA: Diagnosis not present

## 2024-05-05 DIAGNOSIS — R413 Other amnesia: Secondary | ICD-10-CM

## 2024-05-05 DIAGNOSIS — R519 Headache, unspecified: Secondary | ICD-10-CM

## 2024-05-05 DIAGNOSIS — M5412 Radiculopathy, cervical region: Secondary | ICD-10-CM | POA: Diagnosis not present

## 2024-05-05 MED ORDER — PYRIDOSTIGMINE BROMIDE 60 MG PO TABS: 60.0000 mg | ORAL_TABLET | Freq: Three times a day (TID) | ORAL | 11 refills | Status: AC

## 2024-05-05 NOTE — Telephone Encounter (Signed)
 no auth required sent to GI [phone]

## 2024-05-05 NOTE — Patient Instructions (Addendum)
 Your Plan:  Continue Mestinon  60 mg 3 times daily  You will be called to complete an MRI brain and MRI cervical spine  You will be called to get scheduled with physical therapy as well as speech (cognitive) therapy   If you continue to struggle with memory difficulties after completion of cognitive therapy, we can consider pursuing a neurocognitive evaluation which is a more in-depth memory evaluation for causes of memory concerns     Follow-up in 4 months or call earlier if needed      Thank you for coming to see us  at [facility]. I hope we have been able to provide you high quality care today.  You may receive a patient satisfaction survey over the next few weeks. We would appreciate your feedback and comments so that we may continue to improve ourselves and the health of our patients.

## 2024-05-05 NOTE — Telephone Encounter (Signed)
 Referral for physical therapy fax as requested to [facility]. Phone: [phone], Fax: [phone]

## 2024-05-27 ENCOUNTER — Telehealth (HOSPITAL_COMMUNITY): Payer: Self-pay | Admitting: Speech Pathology

## 2024-05-27 ENCOUNTER — Ambulatory Visit
Admission: RE | Admit: 2024-05-27 | Discharge: 2024-05-27 | Disposition: A | Discharge status: Home / Self Care | Source: Ambulatory Visit | Attending: Adult Health | Admitting: Adult Health

## 2024-05-27 ENCOUNTER — Ambulatory Visit (HOSPITAL_COMMUNITY): Admitting: Speech Pathology

## 2024-05-27 ENCOUNTER — Encounter (HOSPITAL_COMMUNITY): Payer: Self-pay | Admitting: Speech Pathology

## 2024-05-27 ENCOUNTER — Ambulatory Visit (HOSPITAL_COMMUNITY): Attending: Adult Health | Admitting: Speech Pathology

## 2024-05-27 ENCOUNTER — Other Ambulatory Visit: Payer: Self-pay

## 2024-05-27 DIAGNOSIS — R413 Other amnesia: Secondary | ICD-10-CM | POA: Diagnosis present

## 2024-05-27 DIAGNOSIS — R519 Headache, unspecified: Secondary | ICD-10-CM

## 2024-05-27 DIAGNOSIS — M542 Cervicalgia: Secondary | ICD-10-CM | POA: Diagnosis not present

## 2024-05-27 DIAGNOSIS — R41841 Cognitive communication deficit: Secondary | ICD-10-CM | POA: Diagnosis present

## 2024-05-27 DIAGNOSIS — M5412 Radiculopathy, cervical region: Secondary | ICD-10-CM

## 2024-05-27 MED ORDER — GADOPICLENOL 0.5 MMOL/ML IV SOLN
8.0000 mL | Freq: Once | INTRAVENOUS | Status: AC | PRN
  Administered 2024-05-27: 8 mL via INTRAVENOUS

## 2024-05-27 NOTE — Telephone Encounter (Signed)
 Telephone call:  Pt did not show for her SLP evaluation 05/27/24 and SLP left a voicemail regarding the same and to call back if she would like to reschedule.  Thank you,  Lamar Candy, CCC-SLP [phone]

## 2024-05-27 NOTE — Therapy (Signed)
 " OUTPATIENT SPEECH LANGUAGE PATHOLOGY EVALUATION   Patient Name: [name] MRN: [id number] DOB:[birth year], 79 y.o., female Today's Date: 05/27/2024  PCP: Crystal Leta NOVAK, MD REFERRING PROVIDER: Whitfield Raisin, NP  END OF SESSION:  End of Session - 05/27/24 1149     Visit Number 1    Number of Visits 9    Date for Recertification  07/08/24    Authorization Type [employer]   Eff: 04/29/24  Oop: [id number]  Copay 25  Auth no  Sw: bayal  Ref # [id number]   SLP Start Time 1125    SLP Stop Time  1210    SLP Time Calculation (min) 45 min    Activity Tolerance Patient tolerated treatment well          Past Medical History:  Diagnosis Date   Arthritis    Bursitis    Diabetes mellitus without complication (HCC)    Dyspnea    Fibromyalgia    GERD (gastroesophageal reflux disease)    Hyperlipemia    Hypertension    Myasthenia gravis (HCC)    ocular   Sinusitis    Sleep apnea    uses a cpap   Sleep apnea    Wears glasses    Past Surgical History:  Procedure Laterality Date   ABDOMINAL HYSTERECTOMY  1999   BREAST SURGERY  2002   rt br bx   CERVICAL FUSION  2007   CHOLECYSTECTOMY  2005   lap choli   COLONOSCOPY     FINGER GANGLION CYST EXCISION     INCISION AND DRAINAGE ABSCESS     Staph infection with multiple abdominal wounds s/p I&D in [place] ~ 2014   SHOULDER ARTHROSCOPY WITH OPEN ROTATOR CUFF REPAIR AND DISTAL CLAVICLE ACROMINECTOMY Right 09/09/2012   Procedure: Right Shoulder Arthroscopy with distal clavicle excision and mini-open rotator cuff repair.;  Surgeon: Norleen LITTIE Gavel, MD;  Location: [facility];  Service: Orthopedics;  Laterality: Right;   TONSILLECTOMY     Patient Active Problem List   Diagnosis Date Noted   Positive anti-CCP test 12/03/2016   Coronary artery calcification seen on CAT scan 12/03/2016   ILD (interstitial lung disease) (HCC) 06/04/2016   Dyspnea and respiratory abnormality 06/04/2016   Primary osteoarthritis of both hands  05/01/2016   Primary osteoarthritis of both knees 05/01/2016   DDD (degenerative disc disease), lumbar 05/01/2016   Trochanteric bursitis of both hips 05/01/2016   Other secondary scoliosis, 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   History of sleep apnea 05/01/2016   History of hypertension 05/01/2016   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   Dyslipidemia 05/01/2016   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 [facility])  Pt accompanied by: self and significant other  PERTINENT HISTORY: [name] is a 79 yo female who was referred by [name] Whitfield, NP for SLP eval and treat due to cognitive communication changes. 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 Rosemarie with initial visit 02/2016. Patient had a fall in 08/2022 hitting her head, she was seen at [facility] with CT head and cervical imaging negative for traumatic injury.Reports ocular MG overall stable continues on Mestinon  60 mg 3 times daily. Per husband, she has been having difficulty managing her medications and believes she  stopped taking around 11/2023 and just recently restarted. He is now managing her medications.  PAIN:  Are you having pain? No  FALLS: Has patient fallen in last 6 months?  No  LIVING ENVIRONMENT: Lives with: lives with their spouse Lives in: House/apartment  PLOF:  Level of assistance: Independent with ADLs, Independent with IADLs Employment: Retired  PATIENT GOALS: Improve memory  OBJECTIVE:  Note: Objective measures were completed at Evaluation unless otherwise noted.  DIAGNOSTIC FINDINGS: MRI pending 05/27/2024  COGNITION: Overall cognitive status: Impaired: Areas of impairment:  Attention: Impaired:  Sustained Memory: Impaired: Immediate Working Short term Clinical Research Associate function: Impaired: Problem solving, Planning, and Slow processing  Functional deficits: husband assists with medication and appointment management   AUDITORY COMPREHENSION: Overall auditory comprehension: Impaired: moderately complex YES/NO questions: Impaired: moderately complex Following directions: Impaired: moderately complex Conversation: Moderately Complex Interfering components: attention, processing speed, and working memory Effective technique: extra processing time, pausing, repetition/stressing words, stressing words, and visual/gestural cues  READING COMPREHENSION: Intact  EXPRESSION: verbal  VERBAL EXPRESSION: Level of generative/spontaneous verbalization: sentence Automatic speech: name: intact and social response: intact  Repetition: Impaired: sentence Naming: Responsive: 51-75%, Confrontation: 51-75%, and Divergent: 26-50% Pragmatics: Appears intact Comments:  Interfering components: attention Effective technique: semantic cues and phonemic cues Non-verbal means of communication: N/A  WRITTEN EXPRESSION: Dominant hand: right Written expression: Appears intact  MOTOR SPEECH: Overall motor speech: Appears intact Level of impairment: N/A Respiration: WNL Phonation: normal Resonance: WFL Articulation: Appears intact Intelligibility: Intelligible Motor planning: Appears intact Motor speech errors: N/A Interfering components: N/A Effective technique: N/A  ORAL MOTOR EXAMINATION: Overall status: WFL Comments:   STANDARDIZED ASSESSMENTS: SLUMS: 7/30 and BOST[place] NAMING TEST: 9/15 short form                                                                                                                   TREATMENT DATE: 05/27/24 Evaluation only completed this date   PATIENT EDUCATION: Education details: Plan for short term cognitive communication therapy Person educated:  Patient and Spouse Education method: Explanation, Demonstration, and Verbal cues Education comprehension: verbalized understanding   GOALS: Goals reviewed with patient? Yes  SHORT TERM GOALS: Target date: 07/08/2024  Pt will utilize external memory strategies in home environment by recording 3 items daily in planner, notebook, daily memory writing task daily for 5/7 days Baseline: occasionally writes in small notebook Goal status: INITIAL  2.  Pt will increase recall of daily schedule, tasks, appointments, medications to 90% acc with use of personalized memory template and min cues. Baseline: mod/max assist Goal status: INITIAL  3.  Pt will implement word-finding strategies with 90% accuracy when unable to verbalize desired word in conversation/functional tasks with mi/mod assist. Baseline: 70% Goal status: INITIAL  4.  Pt will describe objects and pictures by providing at least three salient features as judged by clinician with 90% acc when provided mi/mod cues. Baseline: 75% Goal status: INITIAL  5.  Pt will increase divergent naming to 7 items per concrete category with mi/mod cues.  Baseline: 5 Goal status: INITIAL  6.  Pt will complete personally relevant responsive naming tasks with 95% acc with use of written cues as needed. Baseline: mod assist Goal status: INITIAL  LONG TERM GOALS: Same as short term goals  ASSESSMENT:  CLINICAL IMPRESSION: Patient is a 79 y.o. female who was seen today for a cognitive linguistic evaluation. She presents with overall moderate cognitive communication deficits with impairments in orientation (self and state only), immediate and delayed recall, prospective memory, attention, problem solving, word recall and finding, and executive functioning skills. Reading and writing are intact and she records important phone numbers in a small notebook she carries in her purse. She required supervision and assist from her husband for safety at home with  medication management, cooking, and cleaning. Pt and husband indicate that she hit her head in 08/2022 and wonder if this is impacting her memory. Her husband is supportive and Pt is willing to address the above stated deficits.  OBJECTIVE IMPAIRMENTS: include attention, memory, awareness, executive functioning, and expressive language. These impairments are limiting patient from managing medications, managing appointments, managing finances, household responsibilities, and effectively communicating at home and in community. Factors affecting potential to achieve goals and functional outcome are ability to learn/carryover information. Patient will benefit from skilled SLP services to address above impairments and improve overall function.  REHAB POTENTIAL: Good  PLAN:  SLP FREQUENCY: 2x/week  SLP DURATION: 4 weeks  PLANNED INTERVENTIONS: Environmental controls, Cueing hierachy, Cognitive reorganization, Internal/external aids, Functional tasks, Multimodal communication approach, SLP instruction and feedback, Compensatory strategies, Patient/family education, [phone] Treatment of speech (30 or 45 min) , and 07476- Speech Eval Sound Prod, Artic, Phon, Eval Compre, Express   Thank you,  Lamar Candy, CCC-SLP [phone]  Trula Frede, CCC-SLP 05/27/2024, 12:04 PM      "

## 2024-06-01 ENCOUNTER — Ambulatory Visit (HOSPITAL_COMMUNITY): Admitting: Speech Pathology

## 2024-06-01 ENCOUNTER — Ambulatory Visit: Payer: Self-pay | Admitting: Adult Health

## 2024-06-01 ENCOUNTER — Encounter (HOSPITAL_COMMUNITY): Payer: Self-pay | Admitting: Speech Pathology

## 2024-06-01 DIAGNOSIS — R413 Other amnesia: Secondary | ICD-10-CM

## 2024-06-01 DIAGNOSIS — R41841 Cognitive communication deficit: Secondary | ICD-10-CM

## 2024-06-03 ENCOUNTER — Ambulatory Visit (HOSPITAL_COMMUNITY): Admitting: Speech Pathology

## 2024-06-03 ENCOUNTER — Encounter (HOSPITAL_COMMUNITY): Payer: Self-pay | Admitting: Speech Pathology

## 2024-06-03 DIAGNOSIS — R41841 Cognitive communication deficit: Secondary | ICD-10-CM

## 2024-06-03 NOTE — Therapy (Signed)
 " OUTPATIENT SPEECH LANGUAGE PATHOLOGY TREATMENT   Patient Name: [name] MRN: [id number] DOB:[birth year], 79 y.o., female Today's Date: 06/03/2024  PCP: Rosamond Leta NOVAK, MD REFERRING PROVIDER: Whitfield Raisin, NP  END OF SESSION:  End of Session - 06/03/24 1003     Visit Number 3    Number of Visits 9    Date for Recertification  07/08/24    Authorization Type [employer]   Eff: 04/29/24  Oop: [id number]  Copay 25  Auth no  Sw: bayal  Ref # [id number]   SLP Start Time 0955    SLP Stop Time  1040    SLP Time Calculation (min) 45 min    Activity Tolerance Patient tolerated treatment well          Past Medical History:  Diagnosis Date   Arthritis    Bursitis    Diabetes mellitus without complication (HCC)    Dyspnea    Fibromyalgia    GERD (gastroesophageal reflux disease)    Hyperlipemia    Hypertension    Myasthenia gravis (HCC)    ocular   Sinusitis    Sleep apnea    uses a cpap   Sleep apnea    Wears glasses    Past Surgical History:  Procedure Laterality Date   ABDOMINAL HYSTERECTOMY  1999   BREAST SURGERY  2002   rt br bx   CERVICAL FUSION  2007   CHOLECYSTECTOMY  2005   lap choli   COLONOSCOPY     FINGER GANGLION CYST EXCISION     INCISION AND DRAINAGE ABSCESS     Staph infection with multiple abdominal wounds s/p I&D in [place] ~ 2014   SHOULDER ARTHROSCOPY WITH OPEN ROTATOR CUFF REPAIR AND DISTAL CLAVICLE ACROMINECTOMY Right 09/09/2012   Procedure: Right Shoulder Arthroscopy with distal clavicle excision and mini-open rotator cuff repair.;  Surgeon: Norleen LITTIE Gavel, MD;  Location: [facility];  Service: Orthopedics;  Laterality: Right;   TONSILLECTOMY     Patient Active Problem List   Diagnosis Date Noted   Positive anti-CCP test 12/03/2016   Coronary artery calcification seen on CAT scan 12/03/2016   ILD (interstitial lung disease) (HCC) 06/04/2016   Dyspnea and respiratory abnormality 06/04/2016   Primary osteoarthritis of both hands  05/01/2016   Primary osteoarthritis of both knees 05/01/2016   DDD (degenerative disc disease), lumbar 05/01/2016   Trochanteric bursitis of both hips 05/01/2016   Other secondary scoliosis, 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   History of sleep apnea 05/01/2016   History of hypertension 05/01/2016   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   Dyslipidemia 05/01/2016   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  Pt accompanied by: self and significant other  PERTINENT HISTORY: [name] is a 79 yo female who was referred by Raisin Whitfield, NP for SLP eval and treat due to cognitive communication changes. 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 Rosemarie with initial visit 02/2016. Patient had a fall in 08/2022 hitting her head, she was seen at [facility] with CT head and cervical imaging negative for traumatic injury.Reports ocular MG overall stable continues on Mestinon  60 mg 3 times daily. Per husband, she has been having difficulty managing her medications and believes she stopped taking around 11/2023 and just recently  restarted. He is now managing her medications.  PAIN:  Are you having pain? No  FALLS: Has patient fallen in last 6 months?  No  LIVING ENVIRONMENT: Lives with: lives with their spouse Lives in: House/apartment  PLOF:  Level of assistance: Independent with ADLs, Independent with IADLs Employment: Retired  PATIENT GOALS: Improve memory  OBJECTIVE:  Note: Objective measures were completed at Evaluation unless otherwise noted.  DIAGNOSTIC FINDINGS:  MRI 05/27/2024  STANDARDIZED ASSESSMENTS: SLUMS: 7/30 and B[place]ON NAMING TEST: 9/15 short form                                                                                                                    Previous Treatment: Pt accompanied to therapy by her husband, Johnny. The evaluation and goals were reviewed with Avelina and [place]. They expressed difficulty navigating her phone and transitioning appointments and reminders from her phone to Johnny's. Pt is not able to make an emergency phone call from her phone, so we reviewed her phone this date. She had ~150 unopened texts and voice mails. SLP wrote a template for her to follow on an index card to enter her password and call her husband (after we organized her texts and favorites). She was able to return demonstrate after several trials and was encouraged to practice at home before the next session. She carries a furniture conservator/restorer in her purse and records important names and phone numbers (and has very neat handwriting!). SLP also encouraged her to continue with this practice and also recorded information on the communication/memory template SLP provided. Today, we added personal/bio info and all family members. SLP explained that we will use the external strategies to help her access words and memories that are not readily available to her. Continue to target goals.  TREATMENT DATE: 06/03/24 Pt accompanied to therapy by her husband. She practiced using her iPhone to call Jonny and was able to return demonstrate without assist in our session this morning! Both report feeling pleased that she is able to do this now. SLP facilitated word recall tasks by asking Pt to name family members, important dates, medical team, and friends. She required mod cues to list the above and was encouraged to review the template at home. She was able to identify places and restaurants she frequents with min cues from her husband. Once given reminders and options of names, she was able to select which ones were relevant to her (it was more challenging for her to recall without choices). SLP reiterated the importance of establishing  routines and using external strategies now to help ingrain good habits. She missed a urology appointment in December (marked as a no show), however her husband will call to reschedule (the message likely went to her phone and will need to go to Johnny's in the future).     PATIENT EDUCATION: Education details: Review the communication support template at home Person educated: Patient and Spouse Education method: Explanation, Demonstration, and Verbal cues Education comprehension: verbalized understanding   GOALS:  Goals reviewed with patient? Yes  SHORT TERM GOALS: Target date: 07/08/2024  Pt will utilize external memory strategies in home environment by recording 3 items daily in planner, notebook, daily memory writing task daily for 5/7 days Baseline: occasionally writes in small notebook Goal status: ONGOING  2.  Pt will increase recall of daily schedule, tasks, appointments, medications to 90% acc with use of personalized memory template and min cues. Baseline: mod/max assist Goal status: ONGOING  3.  Pt will implement word-finding strategies with 90% accuracy when unable to verbalize desired word in conversation/functional tasks with mi/mod assist. Baseline: 70% Goal status: ONGOING  4.  Pt will describe objects and pictures by providing at least three salient features as judged by clinician with 90% acc when provided mi/mod cues. Baseline: 75% Goal status: ONGOING  5.  Pt will increase divergent naming to 7 items per concrete category with mi/mod cues.  Baseline: 5 Goal status: ONGOING  6.  Pt will complete personally relevant responsive naming tasks with 95% acc with use of written cues as needed. Baseline: mod assist Goal status: ONGOING  LONG TERM GOALS: Same as short term goals  ASSESSMENT:  CLINICAL IMPRESSION: (from initial evaluation on 05/27/24) Patient is a 79 y.o. female who was seen today for a cognitive linguistic evaluation. She presents with overall  moderate cognitive communication deficits with impairments in orientation (self and state only), immediate and delayed recall, prospective memory, attention, problem solving, word recall and finding, and executive functioning skills. Reading and writing are intact and she records important phone numbers in a small notebook she carries in her purse. She required supervision and assist from her husband for safety at home with medication management, cooking, and cleaning. Pt and husband indicate that she hit her head in 08/2022 and wonder if this is impacting her memory. Her husband is supportive and Pt is willing to address the above stated deficits.  OBJECTIVE IMPAIRMENTS: include attention, memory, awareness, executive functioning, and expressive language. These impairments are limiting patient from managing medications, managing appointments, managing finances, household responsibilities, and effectively communicating at home and in community. Factors affecting potential to achieve goals and functional outcome are ability to learn/carryover information. Patient will benefit from skilled SLP services to address above impairments and improve overall function.  REHAB POTENTIAL: Good  PLAN:  SLP FREQUENCY: 2x/week  SLP DURATION: 4 weeks  PLANNED INTERVENTIONS: Environmental controls, Cueing hierachy, Cognitive reorganization, Internal/external aids, Functional tasks, Multimodal communication approach, SLP instruction and feedback, Compensatory strategies, Patient/family education, [phone] Treatment of speech (30 or 45 min) , and 07476- Speech Eval Sound Prod, Artic, Phon, Eval Compre, Express   Thank you,  Lamar Candy, CCC-SLP [phone]  Thaddeus Evitts, CCC-SLP 06/03/2024, 12:26 PM      "

## 2024-06-03 NOTE — Progress Notes (Signed)
 Called and gave patient's husband the results. Patient's husband requested that the results be sent to the two therapist that the patient is seeing, stated that one was Physical Therapy, and one was [facility] (?).  Patients husband was understanding of the results and had no questions at the time of the call.

## 2024-06-08 ENCOUNTER — Ambulatory Visit (HOSPITAL_COMMUNITY): Admitting: Speech Pathology

## 2024-06-10 ENCOUNTER — Ambulatory Visit (HOSPITAL_COMMUNITY): Admitting: Speech Pathology

## 2024-06-15 ENCOUNTER — Ambulatory Visit (HOSPITAL_COMMUNITY): Admitting: Speech Pathology

## 2024-06-17 ENCOUNTER — Ambulatory Visit (HOSPITAL_COMMUNITY): Admitting: Speech Pathology

## 2024-06-24 ENCOUNTER — Ambulatory Visit (HOSPITAL_COMMUNITY): Admitting: Speech Pathology

## 2024-06-29 ENCOUNTER — Ambulatory Visit (HOSPITAL_COMMUNITY): Admitting: Speech Pathology

## 2024-09-07 ENCOUNTER — Ambulatory Visit: Admitting: Adult Health
